# Patient Record
Sex: Female | Born: 1935 | Race: Black or African American | Hispanic: No | Marital: Single | Smoking: Never smoker
Health system: Southern US, Community
[De-identification: ages and names within clinical notes are randomized; demographics above are authoritative.]

## PROBLEM LIST (undated history)

## (undated) DIAGNOSIS — I639 Cerebral infarction, unspecified: Secondary | ICD-10-CM

## (undated) DIAGNOSIS — I10 Essential (primary) hypertension: Secondary | ICD-10-CM

## (undated) DIAGNOSIS — E7800 Pure hypercholesterolemia, unspecified: Secondary | ICD-10-CM

## (undated) DIAGNOSIS — B370 Candidal stomatitis: Secondary | ICD-10-CM

## (undated) DIAGNOSIS — I4891 Unspecified atrial fibrillation: Secondary | ICD-10-CM

## (undated) DIAGNOSIS — C9200 Acute myeloblastic leukemia, not having achieved remission: Secondary | ICD-10-CM

## (undated) DIAGNOSIS — Z7189 Other specified counseling: Secondary | ICD-10-CM

## (undated) HISTORY — PX: CATARACT EXTRACTION: SUR2

## (undated) HISTORY — PX: ABDOMINAL HYSTERECTOMY: SHX81

---

## 2000-10-18 ENCOUNTER — Encounter: Payer: Self-pay | Admitting: Emergency Medicine

## 2000-10-18 ENCOUNTER — Emergency Department (HOSPITAL_COMMUNITY): Admission: EM | Admit: 2000-10-18 | Discharge: 2000-10-18 | Payer: Self-pay

## 2002-02-09 ENCOUNTER — Encounter: Payer: Self-pay | Admitting: Internal Medicine

## 2002-02-09 ENCOUNTER — Encounter: Admission: RE | Admit: 2002-02-09 | Discharge: 2002-02-09 | Payer: Self-pay | Admitting: Internal Medicine

## 2002-03-06 ENCOUNTER — Encounter: Admission: RE | Admit: 2002-03-06 | Discharge: 2002-03-06 | Payer: Self-pay | Admitting: Internal Medicine

## 2002-03-06 ENCOUNTER — Encounter: Payer: Self-pay | Admitting: Internal Medicine

## 2002-05-02 ENCOUNTER — Ambulatory Visit (HOSPITAL_COMMUNITY): Admission: RE | Admit: 2002-05-02 | Discharge: 2002-05-02 | Payer: Self-pay | Admitting: Gastroenterology

## 2002-10-16 ENCOUNTER — Emergency Department (HOSPITAL_COMMUNITY): Admission: EM | Admit: 2002-10-16 | Discharge: 2002-10-16 | Payer: Self-pay | Admitting: Emergency Medicine

## 2003-03-12 ENCOUNTER — Encounter: Admission: RE | Admit: 2003-03-12 | Discharge: 2003-03-12 | Payer: Self-pay | Admitting: Internal Medicine

## 2004-01-16 ENCOUNTER — Other Ambulatory Visit: Admission: RE | Admit: 2004-01-16 | Discharge: 2004-01-16 | Payer: Self-pay | Admitting: Internal Medicine

## 2004-04-09 ENCOUNTER — Encounter: Admission: RE | Admit: 2004-04-09 | Discharge: 2004-04-09 | Payer: Self-pay | Admitting: Internal Medicine

## 2004-07-03 ENCOUNTER — Ambulatory Visit: Payer: Self-pay | Admitting: Pulmonary Disease

## 2004-07-03 ENCOUNTER — Emergency Department (HOSPITAL_COMMUNITY): Admission: EM | Admit: 2004-07-03 | Discharge: 2004-07-03 | Payer: Self-pay | Admitting: Emergency Medicine

## 2005-01-20 ENCOUNTER — Encounter: Admission: RE | Admit: 2005-01-20 | Discharge: 2005-01-20 | Payer: Self-pay | Admitting: Internal Medicine

## 2005-04-28 ENCOUNTER — Encounter: Admission: RE | Admit: 2005-04-28 | Discharge: 2005-04-28 | Payer: Self-pay | Admitting: Internal Medicine

## 2006-05-02 ENCOUNTER — Encounter: Admission: RE | Admit: 2006-05-02 | Discharge: 2006-05-02 | Payer: Self-pay | Admitting: Internal Medicine

## 2007-04-03 ENCOUNTER — Inpatient Hospital Stay (HOSPITAL_COMMUNITY): Admission: EM | Admit: 2007-04-03 | Discharge: 2007-04-06 | Payer: Self-pay | Admitting: Emergency Medicine

## 2007-04-04 ENCOUNTER — Encounter (INDEPENDENT_AMBULATORY_CARE_PROVIDER_SITE_OTHER): Payer: Self-pay | Admitting: Internal Medicine

## 2007-04-04 ENCOUNTER — Ambulatory Visit: Payer: Self-pay | Admitting: Vascular Surgery

## 2007-04-05 ENCOUNTER — Ambulatory Visit: Payer: Self-pay | Admitting: Physical Medicine & Rehabilitation

## 2007-04-26 ENCOUNTER — Encounter: Admission: RE | Admit: 2007-04-26 | Discharge: 2007-04-26 | Payer: Self-pay | Admitting: Sports Medicine

## 2007-05-05 ENCOUNTER — Encounter: Admission: RE | Admit: 2007-05-05 | Discharge: 2007-05-05 | Payer: Self-pay | Admitting: Internal Medicine

## 2007-06-08 ENCOUNTER — Emergency Department (HOSPITAL_COMMUNITY): Admission: EM | Admit: 2007-06-08 | Discharge: 2007-06-08 | Payer: Self-pay | Admitting: Emergency Medicine

## 2007-06-24 ENCOUNTER — Emergency Department (HOSPITAL_COMMUNITY): Admission: EM | Admit: 2007-06-24 | Discharge: 2007-06-24 | Payer: Self-pay | Admitting: Emergency Medicine

## 2007-06-27 ENCOUNTER — Encounter: Admission: RE | Admit: 2007-06-27 | Discharge: 2007-09-25 | Payer: Self-pay | Admitting: Neurology

## 2007-07-09 ENCOUNTER — Emergency Department (HOSPITAL_COMMUNITY): Admission: EM | Admit: 2007-07-09 | Discharge: 2007-07-09 | Payer: Self-pay | Admitting: Emergency Medicine

## 2007-09-15 ENCOUNTER — Emergency Department (HOSPITAL_COMMUNITY): Admission: EM | Admit: 2007-09-15 | Discharge: 2007-09-15 | Payer: Self-pay | Admitting: Emergency Medicine

## 2008-05-06 ENCOUNTER — Encounter: Admission: RE | Admit: 2008-05-06 | Discharge: 2008-05-06 | Payer: Self-pay | Admitting: Internal Medicine

## 2008-05-20 ENCOUNTER — Emergency Department (HOSPITAL_COMMUNITY): Admission: EM | Admit: 2008-05-20 | Discharge: 2008-05-20 | Payer: Self-pay | Admitting: Emergency Medicine

## 2009-05-08 ENCOUNTER — Encounter: Admission: RE | Admit: 2009-05-08 | Discharge: 2009-05-08 | Payer: Self-pay | Admitting: Internal Medicine

## 2009-08-08 ENCOUNTER — Emergency Department (HOSPITAL_COMMUNITY): Admission: EM | Admit: 2009-08-08 | Discharge: 2009-08-08 | Payer: Self-pay | Admitting: Emergency Medicine

## 2010-03-08 ENCOUNTER — Emergency Department (HOSPITAL_COMMUNITY)
Admission: EM | Admit: 2010-03-08 | Discharge: 2010-03-08 | Payer: Self-pay | Source: Home / Self Care | Admitting: Emergency Medicine

## 2010-03-14 ENCOUNTER — Inpatient Hospital Stay (HOSPITAL_COMMUNITY)
Admission: RE | Admit: 2010-03-14 | Discharge: 2010-03-17 | Payer: Self-pay | Source: Home / Self Care | Attending: Orthopedic Surgery | Admitting: Orthopedic Surgery

## 2010-05-01 ENCOUNTER — Other Ambulatory Visit: Payer: Self-pay | Admitting: Internal Medicine

## 2010-05-01 DIAGNOSIS — Z1239 Encounter for other screening for malignant neoplasm of breast: Secondary | ICD-10-CM

## 2010-05-01 DIAGNOSIS — Z1231 Encounter for screening mammogram for malignant neoplasm of breast: Secondary | ICD-10-CM

## 2010-05-15 ENCOUNTER — Ambulatory Visit
Admission: RE | Admit: 2010-05-15 | Discharge: 2010-05-15 | Disposition: A | Discharge status: Home / Self Care | Payer: Medicare Other | Source: Ambulatory Visit | Attending: Internal Medicine | Admitting: Internal Medicine

## 2010-05-15 DIAGNOSIS — Z1231 Encounter for screening mammogram for malignant neoplasm of breast: Secondary | ICD-10-CM

## 2010-05-31 ENCOUNTER — Observation Stay (HOSPITAL_COMMUNITY)
Admission: EM | Admit: 2010-05-31 | Discharge: 2010-06-01 | Disposition: A | Discharge status: Home / Self Care | Payer: Medicare Other | Attending: Internal Medicine | Admitting: Internal Medicine

## 2010-05-31 DIAGNOSIS — M1990 Unspecified osteoarthritis, unspecified site: Secondary | ICD-10-CM | POA: Insufficient documentation

## 2010-05-31 DIAGNOSIS — Z79899 Other long term (current) drug therapy: Secondary | ICD-10-CM | POA: Insufficient documentation

## 2010-05-31 DIAGNOSIS — K219 Gastro-esophageal reflux disease without esophagitis: Secondary | ICD-10-CM | POA: Insufficient documentation

## 2010-05-31 DIAGNOSIS — Z8673 Personal history of transient ischemic attack (TIA), and cerebral infarction without residual deficits: Secondary | ICD-10-CM | POA: Insufficient documentation

## 2010-05-31 DIAGNOSIS — E785 Hyperlipidemia, unspecified: Secondary | ICD-10-CM | POA: Insufficient documentation

## 2010-05-31 DIAGNOSIS — E559 Vitamin D deficiency, unspecified: Secondary | ICD-10-CM | POA: Insufficient documentation

## 2010-05-31 DIAGNOSIS — R0789 Other chest pain: Principal | ICD-10-CM | POA: Insufficient documentation

## 2010-05-31 DIAGNOSIS — I10 Essential (primary) hypertension: Secondary | ICD-10-CM | POA: Insufficient documentation

## 2010-05-31 DIAGNOSIS — R0602 Shortness of breath: Secondary | ICD-10-CM | POA: Insufficient documentation

## 2010-05-31 DIAGNOSIS — R112 Nausea with vomiting, unspecified: Secondary | ICD-10-CM | POA: Insufficient documentation

## 2010-05-31 DIAGNOSIS — K224 Dyskinesia of esophagus: Secondary | ICD-10-CM | POA: Insufficient documentation

## 2010-06-01 ENCOUNTER — Emergency Department (HOSPITAL_COMMUNITY): Payer: Medicare Other

## 2010-06-01 LAB — POCT CARDIAC MARKERS
CKMB, poc: 1.4 ng/mL (ref 1.0–8.0)
Troponin i, poc: <0.05 ng/mL (ref 0.00–0.09)

## 2010-06-01 LAB — URINE MICROSCOPIC-ADD ON

## 2010-06-01 LAB — COMPREHENSIVE METABOLIC PANEL
Albumin: 3.5 g/dL (ref 3.5–5.2)
Alkaline Phosphatase: 69 U/L (ref 39–117)
BUN: 14 mg/dL (ref 6–23)
GFR calc Af Amer: >60 mL/min (ref >60)
Potassium: 4 mEq/L (ref 3.5–5.1)
Sodium: 138 mEq/L (ref 135–145)
Total Protein: 7.2 g/dL (ref 6.0–8.3)

## 2010-06-01 LAB — CBC
HCT: 36.7 % (ref 36.0–46.0)
Hemoglobin: 11.1 g/dL — ABNORMAL LOW (ref 12.0–15.0)
MCV: 90.6 fL (ref 78.0–100.0)
RBC: 4.05 MIL/uL (ref 3.87–5.11)
RDW: 13.8 % (ref 11.5–15.5)
WBC: 7.7 10*3/uL (ref 4.0–10.5)

## 2010-06-01 LAB — URINALYSIS, ROUTINE W REFLEX MICROSCOPIC
Hgb urine dipstick: NEGATIVE
Specific Gravity, Urine: 1.015 (ref 1.005–1.030)
Urobilinogen, UA: 1 mg/dL (ref 0.0–1.0)

## 2010-06-01 LAB — BASIC METABOLIC PANEL
BUN: 11 mg/dL (ref 6–23)
CO2: 27 mEq/L (ref 19–32)
Chloride: 109 mEq/L (ref 96–112)
Glucose, Bld: 117 mg/dL — ABNORMAL HIGH (ref 70–99)
Potassium: 4.1 mEq/L (ref 3.5–5.1)

## 2010-06-01 LAB — CARDIAC PANEL(CRET KIN+CKTOT+MB+TROPI): CK, MB: 1.5 ng/mL (ref 0.3–4.0)

## 2010-06-01 LAB — LIPID PANEL
Cholesterol: 186 mg/dL (ref 0–200)
HDL: 72 mg/dL (ref >39)
LDL Cholesterol: 108 mg/dL — ABNORMAL HIGH (ref 0–99)
Triglycerides: 29 mg/dL (ref <150)

## 2010-06-01 LAB — CK TOTAL AND CKMB (NOT AT ARMC): Relative Index: INVALID (ref 0.0–2.5)

## 2010-06-08 NOTE — H&P (Signed)
NAME:  Leah Lewis, Leah Lewis              ACCOUNT NO.:  [id number]  MEDICAL RECORD NO.:  [id number]           PATIENT TYPE:  E  LOCATION:  WLED                         FACILITY:  [facility]  PHYSICIAN:  Della Goo, M.D. DATE OF BIRTH:  [birth year]  DATE OF ADMISSION:  06/01/2010 DATE OF DISCHARGE:                             HISTORY & PHYSICAL   DATE OF ADMISSION:  June 01, 2010.  PRIMARY CARE PHYSICIAN:  Georgann Housekeeper, MD  CHIEF COMPLAINT:  Chest pain.  HISTORY OF PRESENT ILLNESS:  This is a 76 year old female who presents to the emergency department secondary to complaints of severe chest pain, tightness and choking spells, which started at about 7 p.m.  The patient reports having a constant feeling of chest tightness and rated the discomfort as being a 7/10.  She reports having 4 episodes of vomiting and reports also having shortness of breath.  She states the discomfort in the midchest area feels like indigestion as well.  The pain lasted approximately 2 hours until she had been given sublingual nitroglycerin x1 in the emergency department.  The emergency department evaluation revealed negative point-of-care markers and an EKG without cardiac changes.  She was referred for medical admission for cardiac rule out.  PAST MEDICAL HISTORY:  Significant for: 1. Hyperlipidemia. 2. 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 3. History of vitamin D deficiency.  PAST SURGICAL HISTORY:  History of a hysterectomy and an open reduction and internal fixation of right ankle and also tonsillectomy.  MEDICATIONS:  Lipitor, vitamin D, calcium, aspirin, prednisolone acetate and Acular.  ALLERGIES:  OXYCODONE, which causes panic attack like symptoms.  SOCIAL HISTORY:  The patient lives with her daughter.  She is a nonsmoker, nondrinker.  No history of illicit drug usage.  FAMILY HISTORY:  Positive for coronary artery disease,  hypertension, diabetes in her mother.  She also has 2 brothers with diabetic disease and she has a sister with breast cancer.  REVIEW OF SYSTEMS:  Pertinents are mentioned above.  PHYSICAL EXAMINATION:  GENERAL:  This is a 76 year old well-nourished, well-developed elderly African American female who is in no discomfort or acute distress currently and she is currently chest pain-free. VITAL SIGNS:  Temperature 97.8, blood pressure 135/73, heart rate 69, respirations 21, O2 saturations 97% to 99%. HEENT:  Normocephalic, atraumatic.  Pupils equally round and reactive to light.  Extraocular movements are intact.  Funduscopic benign.  There is no scleral icterus.  Nares are patent bilaterally.  Oropharynx is clear. NECK:  Supple.  Full range of motion.  No thyromegaly, adenopathy, or jugular venous distention. CARDIOVASCULAR:  Regular rate and rhythm.  No murmurs, gallops or rubs appreciated. LUNGS:  Clear to auscultation bilaterally.  No rales, rhonchi or wheezes. ABDOMEN:  Positive bowel sounds, soft, nontender, nondistended.  No hepatosplenomegaly. EXTREMITIES:  Without cyanosis, clubbing or edema. NEUROLOGIC:  Mild right-sided weakness.  Cranial nerves are intact, but otherwise, there are no focal deficits.  LABORATORY STUDIES:  Point-of-care cardiac markers with a myoglobin of 108, CK-MB 1.4, troponin less than 0.05, lipase 19.  Urinalysis with trace  leukocyte esterase.  Urine microscopic negative.  Chemistry with a sodium of 138, potassium 4.0, chloride 107, carbon dioxide 24, BUN 14, creatinine 0.74, glucose 150, albumin 3.5, AST 19, ALT 11, calcium 8.6. Chest x-ray reveals no acute disease process and EKG reveals a normal sinus rhythm without acute ST-segment changes and the rate is 73.  ASSESSMENT:  This is a 76 year old female being admitted with: 1. Chest pain. 2. Nausea and vomiting. 3. Shortness of breath. 4. Dyslipidemia.  PLAN:  The patient will be admitted for  23-hour observation to a telemetry bed.  Cardiac enzymes will be performed.  The patient will be placed on Nitroglycerin paste, oxygen, and aspirin therapies.  Her regular medications will be further reconciled and GI and DVT prophylaxis will be ordered.  Further workup will ensue pending results of the patient's clinical course and her studies.  She is a full code.     Della Goo, M.D.     HJ/MEDQ  D:  06/01/2010  T:  06/01/2010  Job:  [id number]  cc:   Georgann Housekeeper, MD Fax: [phone]  Electronically Signed by Della Goo M.D. on 06/08/2010 08:24:39 PM

## 2010-06-11 NOTE — Discharge Summary (Signed)
  NAME:  Leah Lewis, Leah Lewis              ACCOUNT NO.:  [id number]  MEDICAL RECORD NO.:  [id number]           PATIENT TYPE:  I  LOCATION:  1411                         FACILITY:  [facility]  PHYSICIAN:  Georgann Housekeeper, MD      DATE OF BIRTH:  07-31-1934  DATE OF ADMISSION:  05/31/2010 DATE OF DISCHARGE:  06/01/2010                              DISCHARGE SUMMARY   DISCHARGE DIAGNOSES: 1. Chest pain, atypical, rule out for myocardial infarction. 2. Gastroesophageal reflux disease/esophageal spasm. 3. History of hypertension. 4. History of osteoarthritis. 5. History of cerebrovascular accident in the past. 6. History of dyslipidemia.  MEDICATION ON DISCHARGE:  Continue with all the home medications which included Lipitor, aspirin, eyedrops and blood pressure medication, omeprazole 40 mg daily new prescription.  LABORATORY DATA:  Cardiac markers x2 negative.  Cholesterol 186, LDL of 108, HDL of 72.  CBC:  White count of 7.7, hemoglobin 11.1.  Blood chemistries:  Potassium 4.1, creatinine 0.7, sodium 140, glucose 117. GI:  LFTs normal.  Lipase 19.  TSH was 4.2.  RADIOLOGIC:  Chest x-ray negative.  EKG normal sinus rhythm.  Telemetry normal sinus.  UA negative.  HOSPITAL COURSE:  A 76 year old female who presented with some chest tightness symptoms and choking sensation was admitted for rule out protocol. 1. Cardiac:  The patient was admitted to Telemetry.  Her monitor     showed normal sinus rhythm.  She had EKG normal.  Chest x-ray was     negative.  Cardiac markers with troponin was negative x2.  She did     not have any more pain.  She, getting more of the history, found to     be more esophageal spasm and acid reflux disease.  Started on PPI.     The patient will continue on this.  As far as her blood pressure     medications and cholesterol medications remained the same as it was     at home.  The patient was put on observation status and was sent     home.  Follow up in two  weeks.     Georgann Housekeeper, MD     KH/MEDQ  D:  06/02/2010  T:  06/02/2010  Job:  [id number]  Electronically Signed by Georgann Housekeeper MD on 06/09/2010 09:39:32 PM

## 2010-06-16 LAB — BASIC METABOLIC PANEL
BUN: 7 mg/dL (ref 6–23)
GFR calc Af Amer: >60 mL/min (ref >60)
GFR calc non Af Amer: >60 mL/min (ref >60)
Potassium: 4.2 mEq/L (ref 3.5–5.1)

## 2010-06-16 LAB — CBC
HCT: 34.5 % — ABNORMAL LOW (ref 36.0–46.0)
MCH: 28.8 pg (ref 26.0–34.0)
Platelets: 181 10*3/uL (ref 150–400)
Platelets: 192 10*3/uL (ref 150–400)
RBC: 3.72 MIL/uL — ABNORMAL LOW (ref 3.87–5.11)
RDW: 13.3 % (ref 11.5–15.5)
WBC: 9.5 10*3/uL (ref 4.0–10.5)

## 2010-06-16 LAB — GLUCOSE, CAPILLARY: Glucose-Capillary: 111 mg/dL — ABNORMAL HIGH (ref 70–99)

## 2010-06-23 LAB — CBC
HCT: 38.4 % (ref 36.0–46.0)
MCV: 89.3 fL (ref 78.0–100.0)
Platelets: 179 10*3/uL (ref 150–400)
RDW: 14.1 % (ref 11.5–15.5)

## 2010-06-23 LAB — URINALYSIS, ROUTINE W REFLEX MICROSCOPIC
Hgb urine dipstick: NEGATIVE
Nitrite: NEGATIVE
Specific Gravity, Urine: 1.011 (ref 1.005–1.030)
Urobilinogen, UA: 1 mg/dL (ref 0.0–1.0)
pH: 7 (ref 5.0–8.0)

## 2010-06-23 LAB — DIFFERENTIAL
Basophils Absolute: 0 10*3/uL (ref 0.0–0.1)
Lymphocytes Relative: 9 % — ABNORMAL LOW (ref 12–46)
Monocytes Absolute: 0.1 10*3/uL (ref 0.1–1.0)
Monocytes Relative: 1 % — ABNORMAL LOW (ref 3–12)
Neutro Abs: 9.1 10*3/uL — ABNORMAL HIGH (ref 1.7–7.7)

## 2010-06-23 LAB — COMPREHENSIVE METABOLIC PANEL
Albumin: 3.7 g/dL (ref 3.5–5.2)
BUN: 8 mg/dL (ref 6–23)
Calcium: 8.8 mg/dL (ref 8.4–10.5)
Creatinine, Ser: 0.79 mg/dL (ref 0.4–1.2)
Total Protein: 7.2 g/dL (ref 6.0–8.3)

## 2010-06-23 LAB — URINE CULTURE

## 2010-08-18 NOTE — H&P (Signed)
NAME:  Leah Lewis, Leah Lewis              ACCOUNT NO.:  [id number]   MEDICAL RECORD NO.:  [id number]          PATIENT TYPE:  INP   LOCATION:  1401                         FACILITY:  [facility]   PHYSICIAN:  Hollice Espy, M.D.DATE OF BIRTH:  [birth year]   DATE OF ADMISSION:  04/03/2007  DATE OF DISCHARGE:                              HISTORY & PHYSICAL   ATTENDING PHYSICIAN:  Hollice Espy, M.D.   PRIMARY CARE PHYSICIAN:  Georgann Housekeeper, M.D.   CONSULTATIONS:  Pramod P. Pearlean Brownie, M.D.   CHIEF COMPLAINT:  Right-sided weakness.   HISTORY OF PRESENT ILLNESS:  The patient is a 76 year old African-  American female with past medical history of osteoporosis and arthritis,  mostly of the right hip, who 2 days prior to coming in started to have  sudden onset weakness and numbness in her right upper extremity followed  by less so in her right lower extremity.  These symptoms started on  Saturday and continued to persist to the point where she was urged to  come into the emergency room. She finally came in today, the day of  admission, April 03, 2007.  CT scan of the head noted a left insular  cortex infarct.  Currently the radiology system is down, but this is  passed on from the neurology attending.  Neurology was consulted due to  initial stroke workup and found the patient had a subacute infarct and  recommended that the patient come in for further evaluation and testing  as the cause of her stroke could not immediately be discerned given the  fact that her blood pressure and blood sugars were within a stable  range.   When the patient first presented to the emergency room, her blood  pressure was 148/70.  Outside of her right-sided weakness, her other  symptoms appeared to be stable. She had no signs of dysarthric speech,  no signs of left-sided weakness, paralysis, or other issues.  In  addition, the patient states that from when the onset of her symptoms  began, her right-sided  weakness seems to be a little bit better with the  numbness, though, persisting upper greater than lower.  Currently she is  otherwise doing well.  She denies any headaches, vision changes,  dysphagia, chest pain, palpitations, shortness of breath, wheeze, cough,  abdominal pain, hematuria, dysuria, constipation, diarrhea, extremity  numbness, weakness, or pain other than as described above.   REVIEW OF SYSTEMS:  Otherwise negative.   PAST MEDICAL HISTORY:  1. Arthritis.  2. Osteoporosis.   MEDICATIONS:  Diclofenac, Actonel.   ALLERGIES:  No known drug allergies.   SOCIAL HISTORY:  She denies tobacco, alcohol, or drug use.   FAMILY HISTORY:  Noncontributory.   PHYSICAL EXAMINATION:  VITAL SIGNS:  On admission, blood pressure  148/70, now down to 121/55.  Respirations 18, O2 saturation 98% on room  air.  Temperature 97.7, pulse 52.  GENERAL:  Alert and oriented x3.  No apparent distress.  HEENT:  Normocephalic and atraumatic.  Mucous membranes are moist.  NECK:  No carotid bruits.  HEART:  Regular rate and rhythm.  S1,  S2.  LUNGS:  Clear to auscultation bilaterally.  ABDOMEN:  Soft, nontender, nondistended.  Positive bowel sounds.  EXTREMITIES:  No clubbing, cyanosis, or edema.  NEUROLOGIC:  She has evidence of a 4 to 4+ strength of the right upper  extremity to a 5 to 5- on the left upper extremity in terms of grip and  flexion.  Her lower extremities show 4+ on the left, 5 to 5- on the  left.  She has no evidence of Babinski sign.  Appears to have somewhat  decreased sensation, but this may be more suggestive than anything else.  Otherwise, she has no type of any type of cerebellar dysfunction.   LABORATORY DATA:  CPK 96, MB 1.0, troponin I 0.03.  Sodium 141,  potassium 4, chloride 106, bicarb 30, BUN 8, creatinine 0.7, glucose 98.  White count 6.6, hemoglobin 11.4, hematocrit 34, MCV 86, platelet count  264.   ASSESSMENT AND PLAN:  1. Subacute left middle cerebral artery  branch infarct determined by      neurology.  Etiology to be determined.  Plan for telemetry, a 2-D      echocardiogram, carotid Dopplers, MRI, MRA to be done.  Will also      check a fasting lipid profile, homocysteine, and hemoglobin A1c.      PT, OT, and rehab to follow.  Neurology will be following along as      well.  2. Osteoporosis, currently stable.  3. Arthritis. The patient is currently pain free.      Hollice Espy, M.D.  Electronically Signed     SKK/MEDQ  D:  04/03/2007  T:  04/03/2007  Job:  [id number]   cc:   Georgann Housekeeper, MD  Fax: [phone]

## 2010-08-18 NOTE — Discharge Summary (Signed)
NAME:  Leah Lewis, Leah Lewis              ACCOUNT NO.:  [id number]   MEDICAL RECORD NO.:  [id number]          PATIENT TYPE:  INP   LOCATION:  1401                         FACILITY:  [facility]   PHYSICIAN:  Deirdre Peer. Polite, M.D. DATE OF BIRTH:  08-15-1934   DATE OF ADMISSION:  04/03/2007  DATE OF DISCHARGE:                               DISCHARGE SUMMARY   DISCHARGE DIAGNOSES:  1. Subacute infarct in the left insular cortex; 2D echocardiogram with      preserved left ventricular function, no source of emboli; bilateral      carotid Doppler, doubt stenosis.  Seen by neuro, PT/OT and speech.      Cleared for discharge with home health, PT and a rolling walker.  2. Elevated blood pressure consistent with hypertension, patient      started on Avapro 75 mg, will continue at home.  3. Dyslipidemia, total cholesterol 191, HDL 39, LDL 134.  LDL is      greater than optimal, will discharge on simvastatin 20 mg daily.  4. Arthritis.  5. Osteoporosis.   DISCHARGE MEDICATIONS:  NEW:  1. Aspirin 325 mg daily.  2. Avapro 75 mg daily.  3. Simvastatin 20 mg daily.  4. Patient will continue Diclofenac and Actonel.   DISPOSITION:  Discharged to home in stable condition with home health  physical therapy as well as a rolling walker.   CONSULTANTS:  Dr. Pearlean Brownie, neurology.   STUDIES:  1. The patient was seen by speech without signs or symptoms of      dysphagia.  2. Carotid Doppler.  Preliminary findings:  No significant plaque, no      significant internal carotid artery stenosis.  3. Lipid panel:  As stated above.  4. CT of the head:  Low density in the left periinsular cortex, acute      left middle cerebral artery infarct cannot be excluded.  5. MRI of the brain:  Final result pending.  There has been difficulty      with the [facility] system this weekend.  6. Hemoglobin A1C 6.2.   HISTORY OF PRESENT ILLNESS:  [age]-year-old female presented to the  hospital with right-sided weakness.  The patient had  an abnormal CT.  Admission was deemed necessary for further evaluation and treatment.  Please see dictated history and physical as well as neuro consult for  further details.   PAST MEDICAL HISTORY:  As stated above.   ALLERGIES:  NONE.   SOCIAL HISTORY:  Negative for tobacco, alcohol or drugs.   FAMILY HISTORY:  Noncontributory.   HOSPITAL COURSE:  The patient was admitted to a telemetry bed for  evaluation and treatment of subacute CVA.  She was seen in consultation  by neurology, had several studies as stated above.  The patient was seen  by speech therapy, physical and occupational therapy.  The patient's  right-sided weakness improved dramatically during this hospitalization.  She was also seen by rehab and they did not feel that inpatient rehab  was required.  The patient is tolerating p.o., blood pressure is fairly  controlled; however, may require further titration of medications at  home.  She is up and ambulating with a rolling walker, and is felt to be  stable for discharge to home.  The patient will be discharged and will  have followup by her primary M.D. in approximately one week.      Deirdre Peer. Polite, M.D.  Electronically Signed     RDP/MEDQ  D:  04/06/2007  T:  04/06/2007  Job:  [id number]

## 2010-08-18 NOTE — Consult Note (Signed)
NAME:  Leah Lewis, Derricka              ACCOUNT NO.:  [id number]   MEDICAL RECORD NO.:  [id number]          PATIENT TYPE:  INP   LOCATION:  1401                         FACILITY:  [facility]   PHYSICIAN:  Pramod P. Pearlean Brownie, MD    DATE OF BIRTH:  [birth year]   DATE OF CONSULTATION:  DATE OF DISCHARGE:                                 CONSULTATION   REFERRING PHYSICIAN:  Dr. __________.   REASON FOR REFERRAL:  Stroke.   HISTORY OF PRESENT ILLNESS:  Ms. Leah Lewis is a 76 year old pleasant  African American lady who developed sudden onset of right hand  paresthesias and weakness on Friday, 5 days ago.  She also noticed that  she was dragging her right leg and was weak.  She called her primary  care physician, was away and the covering physician was prescribed some  Skelaxin as the patient was complaining of some right shoulder and arm  pain as well.  Her symptoms have persisted and hence family brought her  to the emergency room today.  The patient has no known prior history of  stroke, TIA, seizures or significant neurological problems.   PAST MEDICAL HISTORY:  Arthritis.   HOME MEDICATIONS:  Skelaxin, Tylenol and Actonel injections.   PAST SURGICAL HISTORY:  None.   ALLERGIES TO MEDICATIONS:  None.   PRIMARY PHYSICIAN:  __________, M.D.   SOCIAL HISTORY:  The patient lives at home with her daughter.  She is  independent in activities of daily living.  Does not currently smoke or  drink.   PHYSICAL EXAMINATION:  GENERAL:  A pleasant elderly African American  lady who is not in distress.  She is afebrile at present with a pulse  rate of 62 per minute and regular, blood pressure 140/70, temperature  98.1, respiratory rate 18 per minute.  HEENT:  Head is nontraumatic.  ENT exam unremarkable.  NECK:  Supple.  There is no bruit.  CARDIAC:  Normal.  No gallop.  LUNGS:  Clear to auscultation.  ABDOMEN:  Soft, nontender.  NEUROLOGICAL:  She is awake, alert, oriented to time, place and person.  There is no aphasia, apraxia or dysarthria.  Movements are full range.  There is no nystagmus.  Face is symmetric.  There is minimum right  nasolabial fold asymmetry.  Tongue is midline.  Motor system exam  reveals no upper extremity drift, but there is right lower extremity  drift present.  She has weakness of right grip and intrinsic hand  muscles.  She orbits the left and right upper extremity.  She has right  lower extremity drift.  She has significant weakness proximally and  distally and a grade 3/5 strength in right lower extremity.  She had  subjectively decreased touch to pinprick sensation on the right face and  upper and lower body as well.  Coordination is impaired on the right.   On the NIH stroke scale she scored four.  Data reviewed noncontrast CAT  scan of the head done today and reportedly shows a left insular cortex  infarct.  She is subacute actual films are not available for my review  due to the system being down.  WBC count and electrolytes are normal.   IMPRESSION:  [age]-year lady with a subacute left middle cerebral  artery branch infarct.  No known vascular risk factors.  She is admitted  for further stroke workup.   PLAN:  Recommend checking MRA of the brain and MRI scan of the brain,  carotid transcranial Doppler studies, fasting lipid profile, hemoglobin  A1c and homocysteine.  Start aspirin for stroke prevention from for now.  Before happy to follow the patient in consult.  I had a long discussion  with the patient and her family members and answered questions.  Thank  you for the referral.  thank you, recall was an MD           ______________________________  Sunny Schlein. Pearlean Brownie, MD     PPS/MEDQ  D:  04/03/2007  T:  04/04/2007  Job:  [id number]

## 2010-08-21 NOTE — Op Note (Signed)
NAME:  Leah Lewis, Leah Lewis                        ACCOUNT NO.:  [id number]   MEDICAL RECORD NO.:  [id number]                   PATIENT TYPE:  AMB   LOCATION:  ENDO                                 FACILITY:  [facility]   PHYSICIAN:  Danise Edge, M.D.                DATE OF BIRTH:  [birth year]   DATE OF PROCEDURE:  05/02/2002  DATE OF DISCHARGE:                                 OPERATIVE REPORT   PROCEDURE:  Screening colonoscopy.   PROCEDURE INDICATION:  Leah Lewis is a 76 year old female, born Dec 24, 1934.  Leah Lewis is scheduled to undergo her first screening colonoscopy  with polypectomy to prevent colon cancer.  I discussed with Leah Lewis the  complications associated with colonoscopy and polypectomy including a 15 per  1000 risk of bleeding and 1 per 1000 risk of colon perforation.  Leah Lewis  has signed the operative permit.   ENDOSCOPIST:  Charolett Bumpers, M.D.   PREMEDICATION:  Versed 5 mg, Demerol 50 mg.   ENDOSCOPE:  Olympus pediatric colonoscope.   DESCRIPTION OF PROCEDURE:  After obtaining informed consent, Leah Lewis was  placed in the left lateral decubitus position.  I administered intravenous  Demerol and intravenous Versed to achieve conscious sedation for the  procedure.  The patient's blood pressure, oxygen saturation, and cardiac  rhythm were monitored throughout the procedure and documented in the medical  record.   Anal inspection was normal.  Digital rectal exam was normal.  The Olympus  pediatric video colonoscope was introduced into the rectum and advanced to  the proximal ascending colon.  Despite repositioning the patient from the  left lateral decubitus position to the supine position and right lateral  decubitus position while applying external abdominal pressure, I was never  able to intubate the cecum.  The lateral aspect of the cecal wall could be  visualized, but I could never visualize the medial aspect of the cecal wall.  Colonic  preparation for the exam today was excellent.   RECTUM:  Normal.  SIGMOID COLON AND DESCENDING COLON:  Normal.  SPLENIC FLEXURE:  Normal.  TRANSVERSE COLON:  Normal.  HEPATIC FLEXURE:  Normal.  ASCENDING COLON:  Normal.  CECUM AND ILEOCECAL VALVE:  I was unable to intubate the cecum.  With the  endoscope advanced to the proximal ascending colon, I was able to visualize  approximately 50% of the cecum, which appeared normal.    ASSESSMENT:  Normal proctocolonoscopy to the proximal ascending colon.  I  was unable to intubate the cecum.   RECOMMENDATIONS:  Repeat optical colonoscopy or virtual colonoscopy in five  years.                                               Danise Edge, M.D.  MJ/MEDQ  D:  05/02/2002  T:  05/02/2002  Job:  [id number]   cc:   Georgann Housekeeper, M.D.  [address]  [place]  [place] [address]  Fax: [phone]

## 2010-08-21 NOTE — Consult Note (Signed)
NAME:  Leah Lewis, Leah Lewis              ACCOUNT NO.:  [id number]   MEDICAL RECORD NO.:  [id number]          PATIENT TYPE:  EMS   LOCATION:  [facility]                         FACILITY:  [facility]   PHYSICIAN:  Oley Balm. Sung Amabile, M.D. [facility] OF BIRTH:  [birth year]   DATE OF CONSULTATION:  07/03/2004  DATE OF DISCHARGE:                                   CONSULTATION   REFERRING PHYSICIAN:  Dr. Pete Glatter   REASON FOR CONSULTATION:  Mediastinal adenopathy and right pulmonary  nodules.   HISTORY OF PRESENT ILLNESS:  Ms. Leah Lewis is a 76 year old African-American  woman with no prior pulmonary history.  She was in her usual state of good  health until 3 days ago when she developed fevers, chills, cough (white  phlegm), right-sided chest discomfort.  On the day of admission, she became  short of breath with chest tightness.  She presented to the emergency  department with these complaints and in evaluation, had a D-dimer performed  which was mildly elevated.  Consequently, a CT scan of the chest was  performed with the findings noted below.  The findings on the CT scan of the  chest prompted this consultation.  Prior to the onset of this illness, she  had no respiratory complaints whatsoever and has never been told that she  had any abnormal chest x-rays.  She has never had a CAT scan of the chest in  the past.   PAST MEDICAL HISTORY:  1.  Glaucoma.  2.  Osteoarthritis.  3.  Status post hysterectomy.  4.  History of cesarean section.   CURRENT MEDICATIONS:  1.  Eye drops.  2.  Tylenol.  3.  Actonel.   SOCIAL HISTORY:  Never smokes.  No history of significant occupational  exposures.   FAMILY HISTORY:  Noncontributory.   REVIEW OF SYSTEMS:  As per the history of present illness.  In addition, she  had some left leg numbness within the past couple of days.  This has now  resolved.   PHYSICAL EXAMINATION:  VITAL SIGNS:  Afebrile with normal vital signs.  Room  air oxygen saturation is normal.  GENERAL:  She is a well-developed, well-nourished, in no acute cardiac or  respiratory distress.  HEENT:  No acute abnormalities.  NECK:  Supple without adenopathy or jugular venous distention.  CHEST:  Normal percussion throughout.  Breath sounds are full without  wheezes or other adventitious sounds.  CARDIAC:  Regular rate and rhythm with no murmurs.  ABDOMEN:  Soft, nontender with normal bowel sounds.  EXTREMITIES:  Without cyanosis, clubbing, or edema.  NEUROLOGIC:  Without focal deficits.   DATA:  Chest x-ray reveals no acute cardiac or pulmonary disease.  Cardiac  silhouette is upper limits of normal.  CT scan of the chest reveals minimal  right hilar and subcarinal adenopathy.  She also has very small nodules  scattered on the right.  I count 3 or perhaps 4 nodules.   IMPRESSION:  Incidentally found mediastinal adenopathy and pulmonary nodules  - the nodules are extremely small and have the appearance of old  granulomatous disease.  I think it  is unlikely that these represent  malignancy (either primary or metastatic).  Her age would argue against  sarcoidosis.  It is extremely uncommon to make a diagnosis of sarcoidosis in  an individual greater than 53 years of age.  I suspect the adenopathy is  reactive and related to the respiratory tract infection.  In general, I  regard these as incidental findings of probably no significance.   RECOMMENDATIONS:  1.  No further evaluation at this time.  2.  At most, I would recheck a CT scan of the chest in 3 months to ensure      stability.  An alternative would be simply to follow chest x-rays every      3 months for the next year.  Although these nodules and lymph nodes are      not seen on the plain film, if this were a progressive malignant      disorder, they would indeed become evident over time.  3.  I have not scheduled a follow up with her but would be happy to see her      at any time in the future as deemed necessary by  other care providers.      DBS/MEDQ  D:  07/03/2004  T:  07/04/2004  Job:  [id number]   cc:   Hal T. Stoneking, M.D.  [address]  [place], [place] [address]  Fax: [phone]   Georgann Housekeeper, MD  [address]  [place]  [place] [address]  Fax: [phone]   Armanda Magic, M.D.  [address]  [place], [place] [address]  Fax: [phone]

## 2010-12-28 LAB — CBC
HCT: 36
Hemoglobin: 11.8 — ABNORMAL LOW
RBC: 4.07
RDW: 14.4
WBC: 9.1

## 2010-12-28 LAB — POCT CARDIAC MARKERS
Myoglobin, poc: 60.5
Operator id: 284251
Troponin i, poc: <0.05

## 2010-12-28 LAB — URINALYSIS, ROUTINE W REFLEX MICROSCOPIC
Hgb urine dipstick: NEGATIVE
Protein, ur: NEGATIVE
Urobilinogen, UA: 1

## 2010-12-28 LAB — DIFFERENTIAL
Basophils Relative: 0
Eosinophils Relative: 1
Lymphs Abs: 1.6
Monocytes Relative: 7
Neutro Abs: 6.8

## 2010-12-28 LAB — I-STAT 8, (EC8 V) (CONVERTED LAB)
BUN: 8
Bicarbonate: 30.8 — ABNORMAL HIGH
Chloride: 107
HCT: 40
Operator id: 285841
pCO2, Ven: 56.3 — ABNORMAL HIGH
pH, Ven: 7.346 — ABNORMAL HIGH

## 2010-12-28 LAB — URINE CULTURE: Colony Count: 60000

## 2010-12-28 LAB — URINE MICROSCOPIC-ADD ON

## 2010-12-29 LAB — POCT I-STAT, CHEM 8
BUN: 8
Hemoglobin: 12.9
Potassium: 4.1
Sodium: 142
TCO2: 32

## 2010-12-29 LAB — URINALYSIS, ROUTINE W REFLEX MICROSCOPIC
Ketones, ur: NEGATIVE
Nitrite: NEGATIVE
Protein, ur: NEGATIVE
Urobilinogen, UA: 1

## 2010-12-29 LAB — CBC
HCT: 36.3
Hemoglobin: 11.9 — ABNORMAL LOW
RBC: 4.09
RDW: 14.4
WBC: 9.1

## 2010-12-29 LAB — DIFFERENTIAL
Basophils Absolute: 0
Lymphocytes Relative: 23
Lymphs Abs: 2
Monocytes Absolute: 0.9
Neutro Abs: 5.9

## 2010-12-29 LAB — APTT: aPTT: 28

## 2010-12-29 LAB — URINE CULTURE

## 2010-12-29 LAB — URINE MICROSCOPIC-ADD ON

## 2011-01-08 LAB — BASIC METABOLIC PANEL
CO2: 30
Calcium: 8.6
GFR calc Af Amer: >60
GFR calc non Af Amer: >60
Potassium: 4
Sodium: 141

## 2011-01-08 LAB — CARDIAC PANEL(CRET KIN+CKTOT+MB+TROPI)
Relative Index: INVALID
Total CK: 96

## 2011-01-08 LAB — LIPID PANEL
Triglycerides: 88
VLDL: 18

## 2011-01-08 LAB — RAPID URINE DRUG SCREEN, HOSP PERFORMED: Barbiturates: NOT DETECTED

## 2011-01-08 LAB — URINALYSIS, ROUTINE W REFLEX MICROSCOPIC
Glucose, UA: NEGATIVE
Hgb urine dipstick: NEGATIVE
Specific Gravity, Urine: 1.014

## 2011-01-08 LAB — CBC
Hemoglobin: 11.4 — ABNORMAL LOW
RBC: 3.97

## 2011-01-08 LAB — HEMOGLOBIN A1C
Hgb A1c MFr Bld: 6.2 — ABNORMAL HIGH
Mean Plasma Glucose: 143

## 2011-01-08 LAB — URINE MICROSCOPIC-ADD ON

## 2011-02-22 ENCOUNTER — Encounter: Payer: Self-pay | Admitting: *Deleted

## 2011-02-22 ENCOUNTER — Emergency Department (HOSPITAL_COMMUNITY): Payer: Medicare Other

## 2011-02-22 ENCOUNTER — Emergency Department (HOSPITAL_COMMUNITY)
Admission: EM | Admit: 2011-02-22 | Discharge: 2011-02-22 | Disposition: A | Discharge status: Home / Self Care | Payer: Medicare Other | Attending: Emergency Medicine | Admitting: Emergency Medicine

## 2011-02-22 ENCOUNTER — Other Ambulatory Visit: Payer: Self-pay

## 2011-02-22 DIAGNOSIS — I10 Essential (primary) hypertension: Secondary | ICD-10-CM | POA: Insufficient documentation

## 2011-02-22 DIAGNOSIS — R05 Cough: Secondary | ICD-10-CM | POA: Insufficient documentation

## 2011-02-22 DIAGNOSIS — R109 Unspecified abdominal pain: Secondary | ICD-10-CM | POA: Insufficient documentation

## 2011-02-22 DIAGNOSIS — R059 Cough, unspecified: Secondary | ICD-10-CM | POA: Insufficient documentation

## 2011-02-22 DIAGNOSIS — R071 Chest pain on breathing: Secondary | ICD-10-CM | POA: Insufficient documentation

## 2011-02-22 DIAGNOSIS — Z79899 Other long term (current) drug therapy: Secondary | ICD-10-CM | POA: Insufficient documentation

## 2011-02-22 DIAGNOSIS — R0789 Other chest pain: Secondary | ICD-10-CM

## 2011-02-22 DIAGNOSIS — R6889 Other general symptoms and signs: Secondary | ICD-10-CM | POA: Insufficient documentation

## 2011-02-22 DIAGNOSIS — R062 Wheezing: Secondary | ICD-10-CM | POA: Insufficient documentation

## 2011-02-22 DIAGNOSIS — Z8673 Personal history of transient ischemic attack (TIA), and cerebral infarction without residual deficits: Secondary | ICD-10-CM | POA: Insufficient documentation

## 2011-02-22 DIAGNOSIS — Z7982 Long term (current) use of aspirin: Secondary | ICD-10-CM | POA: Insufficient documentation

## 2011-02-22 DIAGNOSIS — E785 Hyperlipidemia, unspecified: Secondary | ICD-10-CM | POA: Insufficient documentation

## 2011-02-22 DIAGNOSIS — R509 Fever, unspecified: Secondary | ICD-10-CM | POA: Insufficient documentation

## 2011-02-22 DIAGNOSIS — J3489 Other specified disorders of nose and nasal sinuses: Secondary | ICD-10-CM | POA: Insufficient documentation

## 2011-02-22 DIAGNOSIS — J069 Acute upper respiratory infection, unspecified: Secondary | ICD-10-CM

## 2011-02-22 HISTORY — DX: Essential (primary) hypertension: I10

## 2011-02-22 HISTORY — DX: Cerebral infarction, unspecified: I63.9

## 2011-02-22 LAB — CBC
MCHC: 31.5 g/dL (ref 30.0–36.0)
MCV: 89.3 fL (ref 78.0–100.0)
Platelets: 181 10*3/uL (ref 150–400)
RDW: 13.4 % (ref 11.5–15.5)
WBC: 9.4 10*3/uL (ref 4.0–10.5)

## 2011-02-22 LAB — CARDIAC PANEL(CRET KIN+CKTOT+MB+TROPI)
Relative Index: 1.8 (ref 0.0–2.5)
Total CK: 176 U/L (ref 7–177)

## 2011-02-22 LAB — DIFFERENTIAL
Basophils Absolute: 0 10*3/uL (ref 0.0–0.1)
Basophils Relative: 0 % (ref 0–1)
Eosinophils Absolute: 0.1 10*3/uL (ref 0.0–0.7)
Eosinophils Relative: 1 % (ref 0–5)
Lymphocytes Relative: 26 % (ref 12–46)

## 2011-02-22 MED ORDER — HYDROCODONE-ACETAMINOPHEN 5-325 MG PO TABS
1.0000 | ORAL_TABLET | Freq: Once | ORAL | Status: AC
  Administered 2011-02-22: 1 via ORAL
  Filled 2011-02-22: qty 1

## 2011-02-22 MED ORDER — HYDROCODONE-ACETAMINOPHEN 5-325 MG PO TABS: 2.0000 | ORAL_TABLET | ORAL | Status: DC | PRN

## 2011-02-22 MED ORDER — OXYCODONE-ACETAMINOPHEN 5-325 MG PO TABS: 2.0000 | ORAL_TABLET | ORAL | Status: AC | PRN

## 2011-02-22 NOTE — ED Notes (Signed)
Pt report received from Selena Batten, Charity fundraiser.  Pt sitting up in bed.  She denies pain/discomfort at this time.  Family at [facility].  Awaiting test results for disposition.

## 2011-02-22 NOTE — ED Notes (Signed)
To ed for eval of right side/rib pain since yesterday. States she has been coughing all night. Denies pain. C/o fever Friday night.

## 2011-02-22 NOTE — ED Notes (Signed)
Assisted pt up to bathroom via w/c without incident.

## 2011-02-22 NOTE — ED Provider Notes (Signed)
History     CSN: [id number] Arrival date & time: 02/22/2011 11:07 AM   First MD Initiated Contact with Patient 02/22/11 1208      Chief Complaint  Patient presents with  . Abdominal Pain    (Consider location/radiation/quality/duration/timing/severity/associated sxs/prior treatment) HPI Comments: Patient here with complaints of right lower rib and lateral chest pain - she states that she began to have cough and cold symptoms starting 1 week ago - she states that since then she developed a runny nose, had several days of fever and chills, but this eventually improved with over the counter medications.  She did not see her PCP for this as well - she states that starting yesterday morning when she rolled over in bed, she began to notice the pain, reports that it is worse with movement and coughing, reports that she has a productive cough with while sputum, denies wheezing or shortness of breath.  Denies nausea, vomiting, diaphoresis and reports no prior cardiac history.  Patient is a 76 y.o. female presenting with chest pain. The history is provided by the patient. No language interpreter was used.  Chest Pain The chest pain began yesterday. Duration of episode(s) is 20 minutes. Chest pain occurs intermittently. The chest pain is unchanged. The pain is associated with coughing and lifting. At its most intense, the pain is at 7/10. The pain is currently at 3/10. The severity of the pain is moderate. The quality of the pain is described as aching and sharp. The pain does not radiate. Chest pain is worsened by deep breathing and certain positions. Primary symptoms include cough and wheezing. Pertinent negatives for primary symptoms include no fever, no syncope, no shortness of breath, no palpitations, no nausea, no vomiting and no dizziness.  Pertinent negatives for associated symptoms include no near-syncope, no numbness, no paroxysmal nocturnal dyspnea and no weakness. She tried nothing for the  symptoms. Risk factors include being elderly.  Her past medical history is significant for hyperlipidemia and hypertension.     Past Medical History  Diagnosis Date  . Hypertension   . Stroke     History reviewed. No pertinent past surgical history.  History reviewed. No pertinent family history.  History  Substance Use Topics  . Smoking status: Not on file  . Smokeless tobacco: Not on file  . Alcohol Use: No    OB History    Grav Para Term Preterm Abortions TAB SAB Ect Mult Living                  Review of Systems  Constitutional: Negative for fever.  Respiratory: Positive for cough and wheezing. Negative for shortness of breath.   Cardiovascular: Positive for chest pain. Negative for palpitations, syncope and near-syncope.  Gastrointestinal: Negative for nausea and vomiting.  Neurological: Negative for dizziness, weakness and numbness.  All other systems reviewed and are negative.    Allergies  Percocet  Home Medications   Current Outpatient Rx  Name Route Sig Dispense Refill  . ASPIRIN EC 81 MG PO TBEC Oral Take 81 mg by mouth daily.      . ATORVASTATIN CALCIUM 40 MG PO TABS Oral Take 40 mg by mouth daily.      Marland Kitchen KETOROLAC TROMETHAMINE 0.5 % OP SOLN Both Eyes Place 1 drop into both eyes 4 (four) times daily.      Marland Kitchen LISINOPRIL 10 MG PO TABS Oral Take 10 mg by mouth daily.      Marland Kitchen METOPROLOL SUCCINATE 50 MG PO TB24  Oral Take 50 mg by mouth daily.      Marland Kitchen PREDNISOLONE ACETATE 1 % OP SUSP Both Eyes Place 1 drop into both eyes 4 (four) times daily.      Marland Kitchen VITAMIN D (CHOLECALCIFEROL) PO Oral Take 1 tablet by mouth daily.        BP 97/52  Pulse 66  Temp(Src) 97.8 F (36.6 C) (Oral)  Resp 16  SpO2 97%  Physical Exam  Nursing note and vitals reviewed. Constitutional: She is oriented to person, place, and time. She appears well-developed and well-nourished. No distress.  HENT:  Head: Normocephalic and atraumatic.  Right Ear: External ear normal.  Left Ear:  External ear normal.  Nose: Rhinorrhea present.  Mouth/Throat: Oropharynx is clear and moist.  Eyes: Conjunctivae are normal. Pupils are equal, round, and reactive to light.  Neck: Normal range of motion. Neck supple.  Cardiovascular: Normal rate, regular rhythm and intact distal pulses.  Exam reveals no gallop and no friction rub.   No murmur heard. Pulmonary/Chest: Effort normal and breath sounds normal. No respiratory distress. She has no wheezes. She has no rales. She exhibits tenderness.    Abdominal: Soft. Bowel sounds are normal. She exhibits no distension and no mass. There is no tenderness.  Musculoskeletal: Normal range of motion.  Neurological: She is alert and oriented to person, place, and time.  Skin: Skin is warm and dry. No rash noted. No erythema. No pallor.  Psychiatric: She has a normal mood and affect. Her behavior is normal. Judgment and thought content normal.    ED Course  Procedures (including critical care time)  Labs Reviewed  CBC - Abnormal; Notable for the following:    Hemoglobin 11.6 (*)    All other components within normal limits  DIFFERENTIAL  I-STAT, CHEM 8   Dg Chest 2 View  02/22/2011  *RADIOLOGY REPORT*  Clinical Data: Shortness of breath, chest pain, and fever.  CHEST - 2 VIEW  Comparison: 06/01/2010 and 06/24/2007  Findings: Mild cardiomegaly, unchanged.  Vascularity is normal and the lungs are clear.  No significant osseous abnormality.  IMPRESSION: No acute disease.  Mild chronic cardiomegaly.  Original Report Authenticated By: Gwynn Burly, M.D.    Date: 02/22/2011  Rate: 48  Rhythm: sinus bradycardia  QRS Axis: normal  Intervals: normal  ST/T Wave abnormalities: normal  Conduction Disutrbances:none  Narrative Interpretation: Tall R wave in V2, non-specific changes - reviewed by Dr. Ethelda Chick  Old EKG Reviewed: changes noted  Chest wall pain    MDM  Patient with non-cardiac like chest wall pain.  Reports improvement in pain  after hydrocodone.  Plan to wait on cardiac enzymes then discharge home.        Izola Price [place], [place] 02/22/11 1555

## 2011-02-22 NOTE — ED Notes (Signed)
Rx and d/c orders reviewed with the pt and family who express verbal understanding.

## 2011-02-22 NOTE — ED Provider Notes (Addendum)
As of right-sided lateral rib pain by cough nasal congestion and sore throat this is accompanied by subjective fever for 3 days no known fever pain is worse with moving or twisting her torso feels improved over a few days ago no treatment and here. The auscultation heart regular rhythm no distress. EKG changes noted.Not felt to Correlate clinically to acute coronary syndrome   Doug Sou, MD 02/22/11 1438  Doug Sou, MD 02/22/11 [phone]

## 2011-02-22 NOTE — ED Notes (Signed)
Pt states she had a cough x 1 week and was sneezing felt better and then sat night she started to have rt side pain still coughing a lot also states she had a sore throat

## 2011-02-22 NOTE — ED Provider Notes (Signed)
Medical screening examination/treatment/procedure(s) were conducted as a shared visit with non-physician practitioner(s) and myself.  I personally evaluated the patient during the encounter  Mandy Fitzwater, MD 02/22/11 1639 

## 2011-04-05 ENCOUNTER — Other Ambulatory Visit: Payer: Self-pay | Admitting: Internal Medicine

## 2011-04-05 DIAGNOSIS — Z1231 Encounter for screening mammogram for malignant neoplasm of breast: Secondary | ICD-10-CM

## 2011-04-12 DIAGNOSIS — M542 Cervicalgia: Secondary | ICD-10-CM | POA: Diagnosis not present

## 2011-04-20 ENCOUNTER — Emergency Department (HOSPITAL_COMMUNITY): Payer: Medicare Other

## 2011-04-20 ENCOUNTER — Encounter (HOSPITAL_COMMUNITY): Payer: Self-pay | Admitting: *Deleted

## 2011-04-20 ENCOUNTER — Observation Stay (HOSPITAL_COMMUNITY)
Admission: EM | Admit: 2011-04-20 | Discharge: 2011-04-21 | Disposition: A | Discharge status: Home / Self Care | Payer: Medicare Other | Attending: Internal Medicine | Admitting: Internal Medicine

## 2011-04-20 ENCOUNTER — Other Ambulatory Visit: Payer: Self-pay

## 2011-04-20 DIAGNOSIS — I6350 Cerebral infarction due to unspecified occlusion or stenosis of unspecified cerebral artery: Secondary | ICD-10-CM | POA: Diagnosis not present

## 2011-04-20 DIAGNOSIS — M5412 Radiculopathy, cervical region: Secondary | ICD-10-CM | POA: Insufficient documentation

## 2011-04-20 DIAGNOSIS — Z8673 Personal history of transient ischemic attack (TIA), and cerebral infarction without residual deficits: Secondary | ICD-10-CM | POA: Diagnosis not present

## 2011-04-20 DIAGNOSIS — Z79899 Other long term (current) drug therapy: Secondary | ICD-10-CM | POA: Diagnosis not present

## 2011-04-20 DIAGNOSIS — K219 Gastro-esophageal reflux disease without esophagitis: Secondary | ICD-10-CM | POA: Diagnosis not present

## 2011-04-20 DIAGNOSIS — I10 Essential (primary) hypertension: Secondary | ICD-10-CM | POA: Insufficient documentation

## 2011-04-20 DIAGNOSIS — E782 Mixed hyperlipidemia: Secondary | ICD-10-CM | POA: Diagnosis not present

## 2011-04-20 DIAGNOSIS — R079 Chest pain, unspecified: Secondary | ICD-10-CM | POA: Diagnosis present

## 2011-04-20 DIAGNOSIS — R0789 Other chest pain: Secondary | ICD-10-CM | POA: Diagnosis not present

## 2011-04-20 DIAGNOSIS — M542 Cervicalgia: Secondary | ICD-10-CM | POA: Diagnosis not present

## 2011-04-20 DIAGNOSIS — R0602 Shortness of breath: Secondary | ICD-10-CM | POA: Insufficient documentation

## 2011-04-20 DIAGNOSIS — E7800 Pure hypercholesterolemia, unspecified: Secondary | ICD-10-CM | POA: Diagnosis not present

## 2011-04-20 DIAGNOSIS — I639 Cerebral infarction, unspecified: Secondary | ICD-10-CM | POA: Diagnosis present

## 2011-04-20 HISTORY — DX: Pure hypercholesterolemia, unspecified: E78.00

## 2011-04-20 LAB — CBC
HCT: 39.5 % (ref 36.0–46.0)
MCH: 28.5 pg (ref 26.0–34.0)
MCHC: 31.9 g/dL (ref 30.0–36.0)
MCV: 89.4 fL (ref 78.0–100.0)
RDW: 13.8 % (ref 11.5–15.5)

## 2011-04-20 LAB — COMPREHENSIVE METABOLIC PANEL
Albumin: 3.1 g/dL — ABNORMAL LOW (ref 3.5–5.2)
BUN: 11 mg/dL (ref 6–23)
Calcium: 9 mg/dL (ref 8.4–10.5)
Creatinine, Ser: 0.75 mg/dL (ref 0.50–1.10)
GFR calc Af Amer: >90 mL/min (ref >90)
Total Protein: 6.8 g/dL (ref 6.0–8.3)

## 2011-04-20 LAB — POCT I-STAT TROPONIN I
Troponin i, poc: 0 ng/mL (ref 0.00–0.08)
Troponin i, poc: 0 ng/mL (ref 0.00–0.08)

## 2011-04-20 LAB — PROTIME-INR
INR: 1.05 (ref 0.00–1.49)
Prothrombin Time: 13.9 seconds (ref 11.6–15.2)

## 2011-04-20 MED ORDER — ROSUVASTATIN CALCIUM 20 MG PO TABS
20.0000 mg | ORAL_TABLET | Freq: Every day | ORAL | Status: DC
  Administered 2011-04-20: 20 mg via ORAL
  Filled 2011-04-20 (×2): qty 1

## 2011-04-20 MED ORDER — CHOLECALCIFEROL 10 MCG (400 UNIT) PO TABS
400.0000 [IU] | ORAL_TABLET | Freq: Every day | ORAL | Status: DC
  Administered 2011-04-20: 400 [IU] via ORAL
  Filled 2011-04-20 (×2): qty 1

## 2011-04-20 MED ORDER — TRAMADOL HCL 50 MG PO TABS: 50.0000 mg | ORAL_TABLET | Freq: Four times a day (QID) | ORAL | Status: DC | PRN

## 2011-04-20 MED ORDER — LISINOPRIL 10 MG PO TABS
10.0000 mg | ORAL_TABLET | Freq: Every day | ORAL | Status: DC
  Filled 2011-04-20 (×2): qty 1

## 2011-04-20 MED ORDER — ACETAMINOPHEN 325 MG PO TABS
650.0000 mg | ORAL_TABLET | Freq: Four times a day (QID) | ORAL | Status: DC | PRN
  Administered 2011-04-20 (×2): 650 mg via ORAL
  Filled 2011-04-20 (×2): qty 2

## 2011-04-20 MED ORDER — METOPROLOL SUCCINATE ER 25 MG PO TB24
25.0000 mg | ORAL_TABLET | Freq: Every day | ORAL | Status: DC
  Filled 2011-04-20 (×2): qty 1

## 2011-04-20 MED ORDER — PREDNISOLONE ACETATE 1 % OP SUSP
1.0000 [drp] | Freq: Four times a day (QID) | OPHTHALMIC | Status: DC
  Administered 2011-04-20 (×2): 1 [drp] via OPHTHALMIC
  Filled 2011-04-20: qty 1

## 2011-04-20 MED ORDER — SODIUM CHLORIDE 0.9 % IV SOLN
20.0000 mL | INTRAVENOUS | Status: DC
  Administered 2011-04-20: 20 mL via INTRAVENOUS

## 2011-04-20 MED ORDER — KETOROLAC TROMETHAMINE 0.5 % OP SOLN
1.0000 [drp] | Freq: Four times a day (QID) | OPHTHALMIC | Status: DC
  Administered 2011-04-20 (×2): 1 [drp] via OPHTHALMIC
  Filled 2011-04-20: qty 3

## 2011-04-20 MED ORDER — ASPIRIN EC 325 MG PO TBEC
325.0000 mg | DELAYED_RELEASE_TABLET | Freq: Every day | ORAL | Status: DC
  Administered 2011-04-20: 325 mg via ORAL
  Filled 2011-04-20 (×2): qty 1

## 2011-04-20 MED ORDER — VITAMIN D (CHOLECALCIFEROL) 10 MCG (400 UNIT) PO CHEW: 1.0000 | CHEWABLE_TABLET | Freq: Every day | ORAL | Status: DC

## 2011-04-20 MED ORDER — ENOXAPARIN SODIUM 40 MG/0.4ML ~~LOC~~ SOLN
40.0000 mg | SUBCUTANEOUS | Status: DC
  Administered 2011-04-20: 40 mg via SUBCUTANEOUS
  Filled 2011-04-20 (×2): qty 0.4

## 2011-04-20 MED ORDER — NITROGLYCERIN 2 % TD OINT
1.0000 [in_us] | TOPICAL_OINTMENT | Freq: Four times a day (QID) | TRANSDERMAL | Status: DC
  Administered 2011-04-20 – 2011-04-21 (×3): 1 [in_us] via TOPICAL
  Filled 2011-04-20 (×3): qty 30

## 2011-04-20 MED ORDER — HYDROCODONE-ACETAMINOPHEN 5-325 MG PO TABS
1.0000 | ORAL_TABLET | Freq: Four times a day (QID) | ORAL | Status: DC | PRN
  Administered 2011-04-21: 1 via ORAL
  Filled 2011-04-20: qty 1

## 2011-04-20 MED ORDER — ACETAMINOPHEN 650 MG RE SUPP: 650.0000 mg | Freq: Four times a day (QID) | RECTAL | Status: DC | PRN

## 2011-04-20 MED ORDER — ASPIRIN 81 MG PO CHEW
324.0000 mg | CHEWABLE_TABLET | Freq: Once | ORAL | Status: DC
  Filled 2011-04-20: qty 4

## 2011-04-20 NOTE — ED Notes (Signed)
Dr. Lynelle Doctor notified that pt is refusing aspirin and nitro.

## 2011-04-20 NOTE — ED Notes (Signed)
Pt is A/O x4. Skin warm and dry. Respirations even and unlabored. NAD noted at this time.   

## 2011-04-20 NOTE — ED Provider Notes (Signed)
History     CSN: [id number]  Arrival date & time 04/20/11  1106   First MD Initiated Contact with Patient 04/20/11 1123      Chief Complaint  Patient presents with  . Chest Pain  . Neck Pain    (Consider location/radiation/quality/duration/timing/severity/associated sxs/prior treatment) Patient is a 77 y.o. female presenting with chest pain and neck pain.  Chest Pain  Her family medical history is significant for CAD in family.    Neck Pain  Associated symptoms include chest pain.  Pt had been having pain in her neck going down her arm for the last week.  She has been diagnosed her with a pinched nerve, disc problem.  This morning it was different and she woke up with sweating and pain in her chest.  The pain in her chest was pressure like.  Some dyspnea, no nausea.  No history of heart disease.  No pain now.  It lasted for  Hours.  She states she does not have history of heart disease and in the past had a stress test.  Past Medical History  Diagnosis Date  . Hypertension   . Stroke   . High cholesterol     Past Surgical History  Procedure Date  . Abdominal hysterectomy   . Cesarean section     No family history on file.  History  Substance Use Topics  . Smoking status: Never Smoker   . Smokeless tobacco: Not on file  . Alcohol Use: No    OB History    Grav Para Term Preterm Abortions TAB SAB Ect Mult Living                  Review of Systems  HENT: Positive for neck pain.   Cardiovascular: Positive for chest pain.  All other systems reviewed and are negative.    Allergies  Percocet  Home Medications   Current Outpatient Rx  Name Route Sig Dispense Refill  . ASPIRIN EC 81 MG PO TBEC Oral Take 81 mg by mouth daily.      . ATORVASTATIN CALCIUM 40 MG PO TABS Oral Take 40 mg by mouth daily.      Marland Kitchen KETOROLAC TROMETHAMINE 0.5 % OP SOLN Both Eyes Place 1 drop into both eyes 4 (four) times daily.      Marland Kitchen LISINOPRIL 10 MG PO TABS Oral Take 10 mg by mouth  daily.      Marland Kitchen METOPROLOL SUCCINATE ER 50 MG PO TB24 Oral Take 50 mg by mouth daily.      Marland Kitchen PREDNISOLONE ACETATE 1 % OP SUSP Both Eyes Place 1 drop into both eyes 4 (four) times daily.      Marland Kitchen VITAMIN D (CHOLECALCIFEROL) PO Oral Take 1 tablet by mouth daily.        BP 132/71  Temp(Src) 98 F (36.7 C) (Oral)  Resp 14  SpO2 100%  Physical Exam  Nursing note and vitals reviewed. Constitutional: She appears well-developed and well-nourished. No distress.  HENT:  Head: Normocephalic and atraumatic.  Right Ear: External ear normal.  Left Ear: External ear normal.  Eyes: Conjunctivae are normal. Right eye exhibits no discharge. Left eye exhibits no discharge. No scleral icterus.  Neck: Neck supple. No tracheal deviation present.       paraspinal ttp left neck  Cardiovascular: Normal rate, regular rhythm and intact distal pulses.   Pulmonary/Chest: Effort normal and breath sounds normal. No stridor. No respiratory distress. She has no wheezes. She has no rales.  Abdominal: Soft. Bowel sounds are normal. She exhibits no distension. There is no tenderness. There is no rebound and no guarding.  Musculoskeletal: She exhibits no edema and no tenderness.  Neurological: She is alert. She has normal strength. No sensory deficit. Cranial nerve deficit: no gross deficits. She exhibits normal muscle tone. She displays no seizure activity. Coordination normal.  Skin: Skin is warm and dry. No rash noted.  Psychiatric: She has a normal mood and affect.    ED Course  Procedures (including critical care time)  Date: 04/20/2011  Rate: 56  Rhythm: normal sinus rhythm  QRS Axis: normal  Intervals: normal  ST/T Wave abnormalities: normal  Conduction Disutrbances:none  Narrative Interpretation: early precordial rs transition  Old EKG Reviewed: unchanged  Patient refused her aspirin and nitroglycerin.  Labs Reviewed  CBC - Abnormal; Notable for the following:    WBC 11.6 (*)    All other components  within normal limits  COMPREHENSIVE METABOLIC PANEL - Abnormal; Notable for the following:    Glucose, Bld 116 (*)    Albumin 3.1 (*)    GFR calc non Af Amer 81 (*)    All other components within normal limits  PROTIME-INR  APTT  POCT I-STAT TROPONIN I  I-STAT TROPONIN I  I-STAT TROPONIN I  I-STAT TROPONIN I   Dg Chest Portable 1 View  04/20/2011  *RADIOLOGY REPORT*  Clinical Data: Chest pain and shortness of breath.  PORTABLE CHEST - 1 VIEW  Comparison: PA and lateral chest 02/22/2011.  Findings: Heart size is mildly enlarged.  Lungs appear clear.  No pneumothorax or effusion.  IMPRESSION: No acute finding.  Original Report Authenticated By: Bernadene Bell. D'ALESSIO, M.D.     1. Chest pain       MDM  Patient presents with symptoms of chest pain. She does high cholesterol and hypertension. Patient's initial cardiac markers are negative. However her symptoms are concerning for the possibility of acute coronary syndrome. With her age and risk factors I recommend admission for observation and further cardiac risk stratification.       Celene Kras, MD 04/20/11 [phone]

## 2011-04-20 NOTE — H&P (Addendum)
Patient's PCP: Georgann Housekeeper, MD, MD  Chief Complaint: Chest pain  History of Present Illness: Leah Lewis is a 77 y.o. African American female with history of hypertension, CVA, hyperlipidemia who presents with the above complaints.  Patient has been having radicular pain from her neck to her left arm or the last week.  Patient is scheduled to see Dr. Ethelene Hal, with orthopedic surgery for consideration of steroid injection.  Patient had not been taking any aspirin over the last 5 days.  Patient woke up this morning around 3 a.m. with dull left-sided aching chest pain.  She reports that she took something for the pain, likely tramadol, which helped with her pain.  She woke up again this morning at 5 a.m. again with the same left-sided dull achy chest pain that lasted till 1030 a.m.  With the pain she complained of being diaphoretic.  She has some numbness and tingling in the left hand but is likely due to the radicular pain that the patient is having from her neck.  Denies any fevers or chills.  Did complain of shortness of breath with chest pain.  Currently denies any chest pain or shortness of breath after coming to the emergency department.  Denies any abdominal pain, headaches, diarrhea or vision changes.  Complaining of left-sided neck pain which extends to her left shoulder to left forearm.  Meds: Scheduled Meds:   . aspirin  324 mg Oral Once  . nitroGLYCERIN  1 inch Topical Q6H   Continuous Infusions:   . sodium chloride     PRN Meds:. Allergies: Percocet Past Medical History  Diagnosis Date  . Hypertension   . Stroke   . High cholesterol    Past Surgical History  Procedure Date  . Abdominal hysterectomy   . Cesarean section   . Cataract extraction     bilateral   Family History  Problem Relation Age of Onset  . Heart attack Mother   . Pneumonia Father   . Parkinsonism Father    History   Social History  . Marital Status: Single    Spouse Name: N/A    Number of  Children: N/A  . Years of Education: N/A   Occupational History  . Not on file.   Social History Main Topics  . Smoking status: Never Smoker   . Smokeless tobacco: Former Neurosurgeon    Types: Chew    Quit date: 04/19/1981  . Alcohol Use: No  . Drug Use: No  . Sexually Active:    Other Topics Concern  . Not on file   Social History Narrative  . No narrative on file   Review of Systems: All systems reviewed with the patient and positive as per history of present illness, otherwise all other systems are negative.  Physical Exam: Blood pressure 97/64, pulse 66, temperature 98.7 F (37.1 C), temperature source Oral, resp. rate 26, SpO2 99.00%. General: Awake, Oriented x3, No acute distress. HEENT: EOMI, Moist mucous membranes Neck: Supple CV: S1 and S2 Lungs: Clear to ascultation bilaterally Abdomen: Soft, Nontender, Nondistended, +bowel sounds. Ext: Good pulses. Trace edema. No clubbing or cyanosis noted. Neuro: Cranial Nerves II-XII grossly intact. Has 5/5 motor strength in upper and lower extremities.  Lab results:  [facility] 04/20/11 1133  NA 138  K 3.8  CL 102  CO2 27  GLUCOSE 116*  BUN 11  CREATININE 0.75  CALCIUM 9.0  MG --  PHOS --    Basename 04/20/11 1133  AST 13  ALT 15  ALKPHOS 76  BILITOT 0.6  PROT 6.8  ALBUMIN 3.1*   No results found for this basename: LIPASE:2,AMYLASE:2 in the last 72 hours  Basename 04/20/11 1133  WBC 11.6*  NEUTROABS --  HGB 12.6  HCT 39.5  MCV 89.4  PLT 210   No results found for this basename: CKTOTAL:3,CKMB:3,CKMBINDEX:3,TROPONINI:3 in the last 72 hours No components found with this basename: POCBNP:3 No results found for this basename: DDIMER in the last 72 hours No results found for this basename: HGBA1C:2 in the last 72 hours No results found for this basename: CHOL:2,HDL:2,LDLCALC:2,TRIG:2,CHOLHDL:2,LDLDIRECT:2 in the last 72 hours No results found for this basename: TSH,T4TOTAL,FREET3,T3FREE,THYROIDAB in the last  72 hours No results found for this basename: VITAMINB12:2,FOLATE:2,FERRITIN:2,TIBC:2,IRON:2,RETICCTPCT:2 in the last 72 hours Imaging results:  Dg Chest Portable 1 View  04/20/2011  *RADIOLOGY REPORT*  Clinical Data: Chest pain and shortness of breath.  PORTABLE CHEST - 1 VIEW  Comparison: PA and lateral chest 02/22/2011.  Findings: Heart size is mildly enlarged.  Lungs appear clear.  No pneumothorax or effusion.  IMPRESSION: No acute finding.  Original Report Authenticated By: Bernadene Bell. Maricela Curet, M.D.   Other results: EKG: normal EKG, normal sinus rhythm, unchanged from previous tracings.  Assessment & Plan by Problem: 1. Chest pain.  Will admit the patient to telemetry.  Will rule the patient out for acute coronary syndrome by pending troponins.  Will send for lipid panel in the morning for risk stratification.  Continue aspirin and continue beta blocker.  Patient has not taken aspirin over the last 5 days due to steroid injection scheduled for next tomorrow in Dr. Grant Fontana office.  Patient reports that she had a stress test about 2 years ago and was negative at that time.  Will defer to patient's PCP to determine any further cardiac ischemic workup.  2. Hypertension.  Continue home medications with hold parameters.  Continue lisinopril and metoprolol.  3. History of stroke.  Continue aspirin.  Not having any acute symptoms at this time.  4. Hyperlipidemia.  Continue statin.  We'll check fasting lipids in the morning.  5. Cervical radiculopathy.  Contacted Dr. Ethelene Hal, with [facility] orthopedics, and left the message informing of patient's admission.  Will defer to Dr. Ethelene Hal if he wants to see the patient in patient for consideration of steroid injection.  6.  Prophylaxis.  Lovenox for DVT prophylaxis.  7.  CODE STATUS.  Full code.  Time spent on admission, talking to the patient, and coordinating care was: 45 mins.  Hurbert Duran A, MD 04/20/2011, 3:46 PM  Addendum: Dr. Grant Fontana office  called back and stated that if the patient could be discharged before 10 o'clock tomorrow patient can likely be seen in Dr. Grant Fontana office for a steroid injection.  If the patient cannot be discharged by that time Dr. Grant Fontana office requested if interventional radiology can give the steroid injection, will defer to her primary care physician to make a decision in the morning after evaluation.  Aquita Simmering A, MD 04/20/2011, 5:17 PM

## 2011-04-20 NOTE — ED Notes (Signed)
Patient is resting comfortably.  NAD noted. Has no needs at this time.  Family at b/s.  Will cont to monitor.  Lights out for comfort.

## 2011-04-20 NOTE — ED Notes (Signed)
Pt reports back pain x1 week, Hx of back problems. Pain worsened early this am, developing into chest pain. Described as achy. Associated with shob and diaphoresis. Denies n/v.

## 2011-04-20 NOTE — ED Notes (Signed)
Dr. Betti Cruz at bedside for admission

## 2011-04-20 NOTE — Progress Notes (Signed)
Pt arrived on unit on stretcher. Amb to bed w/ walker and one min assist. A&Ox4. No c/o at present. Denies CP/SOB. Oriented to callbell and environment. Dinner ordered.

## 2011-04-21 DIAGNOSIS — I10 Essential (primary) hypertension: Secondary | ICD-10-CM | POA: Diagnosis not present

## 2011-04-21 DIAGNOSIS — M542 Cervicalgia: Secondary | ICD-10-CM | POA: Diagnosis not present

## 2011-04-21 DIAGNOSIS — K219 Gastro-esophageal reflux disease without esophagitis: Secondary | ICD-10-CM | POA: Diagnosis not present

## 2011-04-21 DIAGNOSIS — R0789 Other chest pain: Secondary | ICD-10-CM | POA: Diagnosis not present

## 2011-04-21 LAB — CBC
MCH: 27.7 pg (ref 26.0–34.0)
MCHC: 31.1 g/dL (ref 30.0–36.0)
Platelets: 210 10*3/uL (ref 150–400)
RBC: 4.12 MIL/uL (ref 3.87–5.11)

## 2011-04-21 LAB — BASIC METABOLIC PANEL
BUN: 14 mg/dL (ref 6–23)
Calcium: 8.7 mg/dL (ref 8.4–10.5)
GFR calc non Af Amer: 65 mL/min — ABNORMAL LOW (ref >90)
Glucose, Bld: 122 mg/dL — ABNORMAL HIGH (ref 70–99)
Sodium: 138 mEq/L (ref 135–145)

## 2011-04-21 LAB — LIPID PANEL: Cholesterol: 155 mg/dL (ref 0–200)

## 2011-04-21 MED ORDER — OMEPRAZOLE 20 MG PO CPDR: 20.0000 mg | DELAYED_RELEASE_CAPSULE | Freq: Every day | ORAL | Status: DC

## 2011-04-21 MED ORDER — ASPIRIN EC 81 MG PO TBEC: 81.0000 mg | DELAYED_RELEASE_TABLET | Freq: Every day | ORAL | Status: DC

## 2011-04-21 NOTE — Progress Notes (Signed)
Received report from prior RN.  No changes to assessment. Pt resting in bed and denies any needs at this time. Will continue to monitor pt.  Manson Passey, Erinn Mendosa Cherie

## 2011-04-21 NOTE — Discharge Summary (Signed)
Physician Discharge Summary  Patient ID: Leah Lewis MRN: [id number] DOB/AGE: [id number] 75 y.o.  Admit date: 04/20/2011 Discharge date: 04/21/2011  Admission Diagnoses:  Discharge Diagnoses:  Principal Problem:  *Chest pain GERD Active Problems:  Hypertension  Stroke  High cholesterol  Cervical radiculopathy   Discharged Condition: good  Hospital Course: 77 female with CP; atypical; h/o HTN, DJD, GERD C/o GERD symptoms Awaiting neck steriod inj for DJD and pain CP:  Telemetry normal sinus R/o - negative troponin; EKG and CXR normal No Pain. No SOB HTN BP ok GERD start on omeprazole 20 mg  Cervical DJD: steroid injection by Dr Ethelene Hal out pt.   Consults: none  Significant Diagnostic Studies: labs: ok Treatments: IV hydration  Discharge Exam: Blood pressure 107/65, pulse 57, temperature 97.8 F (36.6 C), temperature source Oral, resp. rate 16, height 5\' 4"  (1.626 m), weight 76 kg (167 lb 8.8 oz), SpO2 98.00%. General appearance: alert  Disposition: Home or Self Care  Discharge Orders    Future Appointments: Provider: Department: Dept Phone: Center:   05/17/2011 11:00 AM [facility] [phone] [facility]     Future Orders Please Complete By Expires   Diet - low sodium heart healthy      Increase activity slowly        Medication List  As of 04/21/2011  7:16 AM   TAKE these medications         aspirin EC 81 MG tablet   Take 1 tablet (81 mg total) by mouth daily.      atorvastatin 40 MG tablet   Commonly known as: LIPITOR   Take 40 mg by mouth daily.      ketorolac 0.5 % ophthalmic solution   Commonly known as: ACULAR   Place 1 drop into both eyes 4 (four) times daily.      lisinopril 10 MG tablet   Commonly known as: PRINIVIL,ZESTRIL   Take 10 mg by mouth daily.      metoprolol succinate 50 MG 24 hr tablet   Commonly known as: TOPROL-XL   Take 25 mg by mouth daily. Pt takes 1/2 tab for 25mg       omeprazole 20 MG capsule   Commonly known as: PRILOSEC   Take 1 capsule (20 mg total) by mouth daily.      prednisoLONE acetate 1 % ophthalmic suspension   Commonly known as: PRED FORTE   Place 1 drop into both eyes 4 (four) times daily.      VITAMIN D (CHOLECALCIFEROL) PO   Take 1 tablet by mouth daily.           Follow-up Information    Follow up with Georgann Housekeeper, MD. (as scheduled)              SignedGeorgann Housekeeper 04/21/2011, 7:16 AM

## 2011-05-17 ENCOUNTER — Ambulatory Visit
Admission: RE | Admit: 2011-05-17 | Discharge: 2011-05-17 | Disposition: A | Discharge status: Home / Self Care | Payer: Medicare Other | Source: Ambulatory Visit | Attending: Internal Medicine | Admitting: Internal Medicine

## 2011-05-17 DIAGNOSIS — Z1231 Encounter for screening mammogram for malignant neoplasm of breast: Secondary | ICD-10-CM | POA: Diagnosis not present

## 2011-05-24 DIAGNOSIS — I10 Essential (primary) hypertension: Secondary | ICD-10-CM | POA: Diagnosis not present

## 2011-05-24 DIAGNOSIS — M949 Disorder of cartilage, unspecified: Secondary | ICD-10-CM | POA: Diagnosis not present

## 2011-05-24 DIAGNOSIS — M899 Disorder of bone, unspecified: Secondary | ICD-10-CM | POA: Diagnosis not present

## 2011-05-24 DIAGNOSIS — M1990 Unspecified osteoarthritis, unspecified site: Secondary | ICD-10-CM | POA: Diagnosis not present

## 2011-05-24 DIAGNOSIS — I6789 Other cerebrovascular disease: Secondary | ICD-10-CM | POA: Diagnosis not present

## 2011-05-24 DIAGNOSIS — R7309 Other abnormal glucose: Secondary | ICD-10-CM | POA: Diagnosis not present

## 2011-05-24 DIAGNOSIS — Z1331 Encounter for screening for depression: Secondary | ICD-10-CM | POA: Diagnosis not present

## 2011-05-24 DIAGNOSIS — E782 Mixed hyperlipidemia: Secondary | ICD-10-CM | POA: Diagnosis not present

## 2011-09-14 DIAGNOSIS — M1990 Unspecified osteoarthritis, unspecified site: Secondary | ICD-10-CM | POA: Diagnosis not present

## 2011-09-14 DIAGNOSIS — I10 Essential (primary) hypertension: Secondary | ICD-10-CM | POA: Diagnosis not present

## 2011-09-14 DIAGNOSIS — R7309 Other abnormal glucose: Secondary | ICD-10-CM | POA: Diagnosis not present

## 2011-09-14 DIAGNOSIS — M949 Disorder of cartilage, unspecified: Secondary | ICD-10-CM | POA: Diagnosis not present

## 2011-09-14 DIAGNOSIS — I6789 Other cerebrovascular disease: Secondary | ICD-10-CM | POA: Diagnosis not present

## 2011-09-14 DIAGNOSIS — E782 Mixed hyperlipidemia: Secondary | ICD-10-CM | POA: Diagnosis not present

## 2011-11-26 DIAGNOSIS — H4440 Unspecified hypotony of eye: Secondary | ICD-10-CM | POA: Diagnosis not present

## 2011-11-26 DIAGNOSIS — H4710 Unspecified papilledema: Secondary | ICD-10-CM | POA: Diagnosis not present

## 2011-11-26 DIAGNOSIS — H44119 Panuveitis, unspecified eye: Secondary | ICD-10-CM | POA: Diagnosis not present

## 2011-11-26 DIAGNOSIS — H2700 Aphakia, unspecified eye: Secondary | ICD-10-CM | POA: Diagnosis not present

## 2012-01-20 DIAGNOSIS — Z23 Encounter for immunization: Secondary | ICD-10-CM | POA: Diagnosis not present

## 2012-02-01 DIAGNOSIS — R109 Unspecified abdominal pain: Secondary | ICD-10-CM | POA: Diagnosis not present

## 2012-02-01 DIAGNOSIS — M169 Osteoarthritis of hip, unspecified: Secondary | ICD-10-CM | POA: Diagnosis not present

## 2012-02-23 DIAGNOSIS — M810 Age-related osteoporosis without current pathological fracture: Secondary | ICD-10-CM | POA: Diagnosis not present

## 2012-02-23 DIAGNOSIS — R7309 Other abnormal glucose: Secondary | ICD-10-CM | POA: Diagnosis not present

## 2012-02-23 DIAGNOSIS — E782 Mixed hyperlipidemia: Secondary | ICD-10-CM | POA: Diagnosis not present

## 2012-02-23 DIAGNOSIS — I10 Essential (primary) hypertension: Secondary | ICD-10-CM | POA: Diagnosis not present

## 2012-02-23 DIAGNOSIS — M1990 Unspecified osteoarthritis, unspecified site: Secondary | ICD-10-CM | POA: Diagnosis not present

## 2012-02-23 DIAGNOSIS — Z1331 Encounter for screening for depression: Secondary | ICD-10-CM | POA: Diagnosis not present

## 2012-02-23 DIAGNOSIS — Z0000 Encounter for general adult medical examination without abnormal findings: Secondary | ICD-10-CM | POA: Diagnosis not present

## 2012-02-23 DIAGNOSIS — I6990 Unspecified sequelae of unspecified cerebrovascular disease: Secondary | ICD-10-CM | POA: Diagnosis not present

## 2012-03-14 DIAGNOSIS — M1710 Unilateral primary osteoarthritis, unspecified knee: Secondary | ICD-10-CM | POA: Diagnosis not present

## 2012-04-26 ENCOUNTER — Other Ambulatory Visit: Payer: Self-pay | Admitting: Internal Medicine

## 2012-04-26 DIAGNOSIS — Z1231 Encounter for screening mammogram for malignant neoplasm of breast: Secondary | ICD-10-CM

## 2012-05-25 ENCOUNTER — Ambulatory Visit
Admission: RE | Admit: 2012-05-25 | Discharge: 2012-05-25 | Disposition: A | Discharge status: Home / Self Care | Payer: Medicare Other | Source: Ambulatory Visit | Attending: Internal Medicine | Admitting: Internal Medicine

## 2012-05-25 DIAGNOSIS — Z1231 Encounter for screening mammogram for malignant neoplasm of breast: Secondary | ICD-10-CM | POA: Diagnosis not present

## 2012-07-19 DIAGNOSIS — N390 Urinary tract infection, site not specified: Secondary | ICD-10-CM | POA: Diagnosis not present

## 2012-07-26 DIAGNOSIS — M2010 Hallux valgus (acquired), unspecified foot: Secondary | ICD-10-CM | POA: Diagnosis not present

## 2012-07-26 DIAGNOSIS — M21619 Bunion of unspecified foot: Secondary | ICD-10-CM | POA: Diagnosis not present

## 2012-08-22 DIAGNOSIS — R7309 Other abnormal glucose: Secondary | ICD-10-CM | POA: Diagnosis not present

## 2012-08-22 DIAGNOSIS — I6990 Unspecified sequelae of unspecified cerebrovascular disease: Secondary | ICD-10-CM | POA: Diagnosis not present

## 2012-08-22 DIAGNOSIS — E782 Mixed hyperlipidemia: Secondary | ICD-10-CM | POA: Diagnosis not present

## 2012-08-22 DIAGNOSIS — M1990 Unspecified osteoarthritis, unspecified site: Secondary | ICD-10-CM | POA: Diagnosis not present

## 2012-08-22 DIAGNOSIS — R350 Frequency of micturition: Secondary | ICD-10-CM | POA: Diagnosis not present

## 2012-08-22 DIAGNOSIS — I10 Essential (primary) hypertension: Secondary | ICD-10-CM | POA: Diagnosis not present

## 2012-09-08 DIAGNOSIS — H4710 Unspecified papilledema: Secondary | ICD-10-CM | POA: Diagnosis not present

## 2012-09-08 DIAGNOSIS — H47099 Other disorders of optic nerve, not elsewhere classified, unspecified eye: Secondary | ICD-10-CM | POA: Diagnosis not present

## 2012-09-08 DIAGNOSIS — H2700 Aphakia, unspecified eye: Secondary | ICD-10-CM | POA: Diagnosis not present

## 2012-09-08 DIAGNOSIS — H4440 Unspecified hypotony of eye: Secondary | ICD-10-CM | POA: Diagnosis not present

## 2012-09-08 DIAGNOSIS — H44119 Panuveitis, unspecified eye: Secondary | ICD-10-CM | POA: Diagnosis not present

## 2013-01-12 DIAGNOSIS — H4710 Unspecified papilledema: Secondary | ICD-10-CM | POA: Diagnosis not present

## 2013-01-12 DIAGNOSIS — H2700 Aphakia, unspecified eye: Secondary | ICD-10-CM | POA: Diagnosis not present

## 2013-01-12 DIAGNOSIS — H44119 Panuveitis, unspecified eye: Secondary | ICD-10-CM | POA: Diagnosis not present

## 2013-01-12 DIAGNOSIS — H4440 Unspecified hypotony of eye: Secondary | ICD-10-CM | POA: Diagnosis not present

## 2013-02-22 DIAGNOSIS — I6990 Unspecified sequelae of unspecified cerebrovascular disease: Secondary | ICD-10-CM | POA: Diagnosis not present

## 2013-02-22 DIAGNOSIS — I10 Essential (primary) hypertension: Secondary | ICD-10-CM | POA: Diagnosis not present

## 2013-02-22 DIAGNOSIS — E782 Mixed hyperlipidemia: Secondary | ICD-10-CM | POA: Diagnosis not present

## 2013-02-22 DIAGNOSIS — M1990 Unspecified osteoarthritis, unspecified site: Secondary | ICD-10-CM | POA: Diagnosis not present

## 2013-02-22 DIAGNOSIS — Z0000 Encounter for general adult medical examination without abnormal findings: Secondary | ICD-10-CM | POA: Diagnosis not present

## 2013-02-22 DIAGNOSIS — R7309 Other abnormal glucose: Secondary | ICD-10-CM | POA: Diagnosis not present

## 2013-02-22 DIAGNOSIS — Z23 Encounter for immunization: Secondary | ICD-10-CM | POA: Diagnosis not present

## 2013-02-22 DIAGNOSIS — M810 Age-related osteoporosis without current pathological fracture: Secondary | ICD-10-CM | POA: Diagnosis not present

## 2013-02-22 DIAGNOSIS — Z1331 Encounter for screening for depression: Secondary | ICD-10-CM | POA: Diagnosis not present

## 2013-05-21 ENCOUNTER — Other Ambulatory Visit: Payer: Self-pay

## 2013-05-21 DIAGNOSIS — Z1231 Encounter for screening mammogram for malignant neoplasm of breast: Secondary | ICD-10-CM

## 2013-06-07 ENCOUNTER — Ambulatory Visit: Payer: Medicare Other

## 2013-06-12 ENCOUNTER — Ambulatory Visit
Admission: RE | Admit: 2013-06-12 | Discharge: 2013-06-12 | Disposition: A | Discharge status: Home / Self Care | Payer: Medicare Other | Source: Ambulatory Visit

## 2013-06-12 DIAGNOSIS — Z1231 Encounter for screening mammogram for malignant neoplasm of breast: Secondary | ICD-10-CM

## 2013-06-27 DIAGNOSIS — N3942 Incontinence without sensory awareness: Secondary | ICD-10-CM | POA: Diagnosis not present

## 2013-06-27 DIAGNOSIS — E782 Mixed hyperlipidemia: Secondary | ICD-10-CM | POA: Diagnosis not present

## 2013-06-27 DIAGNOSIS — I10 Essential (primary) hypertension: Secondary | ICD-10-CM | POA: Diagnosis not present

## 2013-06-27 DIAGNOSIS — M810 Age-related osteoporosis without current pathological fracture: Secondary | ICD-10-CM | POA: Diagnosis not present

## 2013-06-27 DIAGNOSIS — I6990 Unspecified sequelae of unspecified cerebrovascular disease: Secondary | ICD-10-CM | POA: Diagnosis not present

## 2013-11-28 DIAGNOSIS — E782 Mixed hyperlipidemia: Secondary | ICD-10-CM | POA: Diagnosis not present

## 2013-11-28 DIAGNOSIS — H6120 Impacted cerumen, unspecified ear: Secondary | ICD-10-CM | POA: Diagnosis not present

## 2013-11-28 DIAGNOSIS — M899 Disorder of bone, unspecified: Secondary | ICD-10-CM | POA: Diagnosis not present

## 2013-11-28 DIAGNOSIS — M949 Disorder of cartilage, unspecified: Secondary | ICD-10-CM | POA: Diagnosis not present

## 2013-11-28 DIAGNOSIS — I10 Essential (primary) hypertension: Secondary | ICD-10-CM | POA: Diagnosis not present

## 2013-12-17 DIAGNOSIS — I10 Essential (primary) hypertension: Secondary | ICD-10-CM | POA: Diagnosis not present

## 2013-12-17 DIAGNOSIS — M949 Disorder of cartilage, unspecified: Secondary | ICD-10-CM | POA: Diagnosis not present

## 2013-12-17 DIAGNOSIS — M899 Disorder of bone, unspecified: Secondary | ICD-10-CM | POA: Diagnosis not present

## 2013-12-17 DIAGNOSIS — H6120 Impacted cerumen, unspecified ear: Secondary | ICD-10-CM | POA: Diagnosis not present

## 2014-01-02 DIAGNOSIS — M169 Osteoarthritis of hip, unspecified: Secondary | ICD-10-CM | POA: Diagnosis not present

## 2014-01-02 DIAGNOSIS — M1610 Unilateral primary osteoarthritis, unspecified hip: Secondary | ICD-10-CM | POA: Diagnosis not present

## 2014-01-02 DIAGNOSIS — R350 Frequency of micturition: Secondary | ICD-10-CM | POA: Diagnosis not present

## 2014-01-02 DIAGNOSIS — M5137 Other intervertebral disc degeneration, lumbosacral region: Secondary | ICD-10-CM | POA: Diagnosis not present

## 2014-01-02 DIAGNOSIS — M6281 Muscle weakness (generalized): Secondary | ICD-10-CM | POA: Diagnosis not present

## 2014-01-02 DIAGNOSIS — Z23 Encounter for immunization: Secondary | ICD-10-CM | POA: Diagnosis not present

## 2014-01-02 DIAGNOSIS — R269 Unspecified abnormalities of gait and mobility: Secondary | ICD-10-CM | POA: Diagnosis not present

## 2014-01-02 DIAGNOSIS — I6990 Unspecified sequelae of unspecified cerebrovascular disease: Secondary | ICD-10-CM | POA: Diagnosis not present

## 2014-01-02 DIAGNOSIS — M549 Dorsalgia, unspecified: Secondary | ICD-10-CM | POA: Diagnosis not present

## 2014-02-08 DIAGNOSIS — H4710 Unspecified papilledema: Secondary | ICD-10-CM | POA: Diagnosis not present

## 2014-02-08 DIAGNOSIS — H2703 Aphakia, bilateral: Secondary | ICD-10-CM | POA: Diagnosis not present

## 2014-02-08 DIAGNOSIS — H4440 Unspecified hypotony of eye: Secondary | ICD-10-CM | POA: Diagnosis not present

## 2014-02-08 DIAGNOSIS — H44113 Panuveitis, bilateral: Secondary | ICD-10-CM | POA: Diagnosis not present

## 2014-02-08 DIAGNOSIS — H3321 Serous retinal detachment, right eye: Secondary | ICD-10-CM | POA: Diagnosis not present

## 2014-03-28 DIAGNOSIS — M1990 Unspecified osteoarthritis, unspecified site: Secondary | ICD-10-CM | POA: Diagnosis not present

## 2014-03-28 DIAGNOSIS — G8191 Hemiplegia, unspecified affecting right dominant side: Secondary | ICD-10-CM | POA: Diagnosis not present

## 2014-03-28 DIAGNOSIS — Z23 Encounter for immunization: Secondary | ICD-10-CM | POA: Diagnosis not present

## 2014-03-28 DIAGNOSIS — M8580 Other specified disorders of bone density and structure, unspecified site: Secondary | ICD-10-CM | POA: Diagnosis not present

## 2014-03-28 DIAGNOSIS — Z1389 Encounter for screening for other disorder: Secondary | ICD-10-CM | POA: Diagnosis not present

## 2014-03-28 DIAGNOSIS — Z0000 Encounter for general adult medical examination without abnormal findings: Secondary | ICD-10-CM | POA: Diagnosis not present

## 2014-03-28 DIAGNOSIS — R7309 Other abnormal glucose: Secondary | ICD-10-CM | POA: Diagnosis not present

## 2014-03-28 DIAGNOSIS — I639 Cerebral infarction, unspecified: Secondary | ICD-10-CM | POA: Diagnosis not present

## 2014-03-28 DIAGNOSIS — E782 Mixed hyperlipidemia: Secondary | ICD-10-CM | POA: Diagnosis not present

## 2014-03-28 DIAGNOSIS — I10 Essential (primary) hypertension: Secondary | ICD-10-CM | POA: Diagnosis not present

## 2014-05-10 ENCOUNTER — Other Ambulatory Visit: Payer: Self-pay

## 2014-05-10 DIAGNOSIS — Z1231 Encounter for screening mammogram for malignant neoplasm of breast: Secondary | ICD-10-CM

## 2014-05-16 DIAGNOSIS — H542 Low vision, both eyes: Secondary | ICD-10-CM | POA: Diagnosis not present

## 2014-05-16 DIAGNOSIS — H548 Legal blindness, as defined in USA: Secondary | ICD-10-CM | POA: Diagnosis not present

## 2014-05-16 DIAGNOSIS — H44113 Panuveitis, bilateral: Secondary | ICD-10-CM | POA: Diagnosis not present

## 2014-06-19 ENCOUNTER — Ambulatory Visit
Admission: RE | Admit: 2014-06-19 | Discharge: 2014-06-19 | Disposition: A | Discharge status: Home / Self Care | Payer: Medicare Other | Source: Ambulatory Visit

## 2014-06-19 DIAGNOSIS — Z1231 Encounter for screening mammogram for malignant neoplasm of breast: Secondary | ICD-10-CM

## 2014-09-30 DIAGNOSIS — M810 Age-related osteoporosis without current pathological fracture: Secondary | ICD-10-CM | POA: Diagnosis not present

## 2014-09-30 DIAGNOSIS — G8191 Hemiplegia, unspecified affecting right dominant side: Secondary | ICD-10-CM | POA: Diagnosis not present

## 2014-09-30 DIAGNOSIS — I10 Essential (primary) hypertension: Secondary | ICD-10-CM | POA: Diagnosis not present

## 2014-09-30 DIAGNOSIS — R300 Dysuria: Secondary | ICD-10-CM | POA: Diagnosis not present

## 2014-09-30 DIAGNOSIS — I639 Cerebral infarction, unspecified: Secondary | ICD-10-CM | POA: Diagnosis not present

## 2014-09-30 DIAGNOSIS — E782 Mixed hyperlipidemia: Secondary | ICD-10-CM | POA: Diagnosis not present

## 2014-09-30 DIAGNOSIS — R2681 Unsteadiness on feet: Secondary | ICD-10-CM | POA: Diagnosis not present

## 2015-03-02 ENCOUNTER — Other Ambulatory Visit: Payer: Self-pay

## 2015-03-02 ENCOUNTER — Emergency Department (HOSPITAL_BASED_OUTPATIENT_CLINIC_OR_DEPARTMENT_OTHER)
Admit: 2015-03-02 | Discharge: 2015-03-02 | Disposition: A | Discharge status: Home / Self Care | Payer: Medicare Other | Attending: Emergency Medicine | Admitting: Emergency Medicine

## 2015-03-02 ENCOUNTER — Encounter (HOSPITAL_COMMUNITY): Payer: Self-pay | Admitting: Emergency Medicine

## 2015-03-02 ENCOUNTER — Inpatient Hospital Stay (HOSPITAL_COMMUNITY)
Admission: EM | Admit: 2015-03-02 | Discharge: 2015-03-04 | DRG: 301 | Disposition: A | Discharge status: Home / Self Care | Payer: Medicare Other | Attending: Internal Medicine | Admitting: Internal Medicine

## 2015-03-02 ENCOUNTER — Inpatient Hospital Stay (HOSPITAL_COMMUNITY): Payer: Medicare Other

## 2015-03-02 DIAGNOSIS — I82432 Acute embolism and thrombosis of left popliteal vein: Secondary | ICD-10-CM | POA: Diagnosis not present

## 2015-03-02 DIAGNOSIS — Z8673 Personal history of transient ischemic attack (TIA), and cerebral infarction without residual deficits: Secondary | ICD-10-CM

## 2015-03-02 DIAGNOSIS — I824Z2 Acute embolism and thrombosis of unspecified deep veins of left distal lower extremity: Secondary | ICD-10-CM | POA: Diagnosis not present

## 2015-03-02 DIAGNOSIS — Z87891 Personal history of nicotine dependence: Secondary | ICD-10-CM

## 2015-03-02 DIAGNOSIS — Z7983 Long term (current) use of bisphosphonates: Secondary | ICD-10-CM

## 2015-03-02 DIAGNOSIS — M7989 Other specified soft tissue disorders: Secondary | ICD-10-CM | POA: Diagnosis not present

## 2015-03-02 DIAGNOSIS — Z79899 Other long term (current) drug therapy: Secondary | ICD-10-CM | POA: Diagnosis not present

## 2015-03-02 DIAGNOSIS — I82409 Acute embolism and thrombosis of unspecified deep veins of unspecified lower extremity: Secondary | ICD-10-CM

## 2015-03-02 DIAGNOSIS — Z7982 Long term (current) use of aspirin: Secondary | ICD-10-CM

## 2015-03-02 DIAGNOSIS — Z23 Encounter for immunization: Secondary | ICD-10-CM

## 2015-03-02 DIAGNOSIS — I82442 Acute embolism and thrombosis of left tibial vein: Secondary | ICD-10-CM | POA: Diagnosis not present

## 2015-03-02 DIAGNOSIS — K219 Gastro-esophageal reflux disease without esophagitis: Secondary | ICD-10-CM | POA: Diagnosis not present

## 2015-03-02 DIAGNOSIS — Z90710 Acquired absence of both cervix and uterus: Secondary | ICD-10-CM

## 2015-03-02 DIAGNOSIS — I82412 Acute embolism and thrombosis of left femoral vein: Principal | ICD-10-CM | POA: Diagnosis present

## 2015-03-02 DIAGNOSIS — E7800 Pure hypercholesterolemia, unspecified: Secondary | ICD-10-CM | POA: Diagnosis present

## 2015-03-02 DIAGNOSIS — I10 Essential (primary) hypertension: Secondary | ICD-10-CM | POA: Diagnosis present

## 2015-03-02 DIAGNOSIS — Z9842 Cataract extraction status, left eye: Secondary | ICD-10-CM

## 2015-03-02 DIAGNOSIS — D649 Anemia, unspecified: Secondary | ICD-10-CM

## 2015-03-02 DIAGNOSIS — E785 Hyperlipidemia, unspecified: Secondary | ICD-10-CM | POA: Diagnosis present

## 2015-03-02 DIAGNOSIS — I82492 Acute embolism and thrombosis of other specified deep vein of left lower extremity: Secondary | ICD-10-CM | POA: Diagnosis present

## 2015-03-02 DIAGNOSIS — Z9841 Cataract extraction status, right eye: Secondary | ICD-10-CM | POA: Diagnosis not present

## 2015-03-02 DIAGNOSIS — I82402 Acute embolism and thrombosis of unspecified deep veins of left lower extremity: Secondary | ICD-10-CM | POA: Diagnosis not present

## 2015-03-02 DIAGNOSIS — D696 Thrombocytopenia, unspecified: Secondary | ICD-10-CM | POA: Diagnosis present

## 2015-03-02 DIAGNOSIS — R2242 Localized swelling, mass and lump, left lower limb: Secondary | ICD-10-CM | POA: Diagnosis not present

## 2015-03-02 DIAGNOSIS — R0602 Shortness of breath: Secondary | ICD-10-CM | POA: Diagnosis not present

## 2015-03-02 LAB — CBC WITH DIFFERENTIAL/PLATELET
Basophils Absolute: 0 10*3/uL (ref 0.0–0.1)
Basophils Relative: 0 %
Eosinophils Absolute: 0.1 10*3/uL (ref 0.0–0.7)
Eosinophils Relative: 1 %
HEMATOCRIT: 35.1 % — AB (ref 36.0–46.0)
Hemoglobin: 11.2 g/dL — ABNORMAL LOW (ref 12.0–15.0)
LYMPHS ABS: 1.9 10*3/uL (ref 0.7–4.0)
LYMPHS PCT: 22 %
MCH: 29.2 pg (ref 26.0–34.0)
MCHC: 31.9 g/dL (ref 30.0–36.0)
MCV: 91.6 fL (ref 78.0–100.0)
Monocytes Absolute: 1.1 10*3/uL — ABNORMAL HIGH (ref 0.1–1.0)
Monocytes Relative: 13 %
NEUTROS ABS: 5.7 10*3/uL (ref 1.7–7.7)
NEUTROS PCT: 64 %
Platelets: 124 10*3/uL — ABNORMAL LOW (ref 150–400)
RBC: 3.83 MIL/uL — AB (ref 3.87–5.11)
RDW: 13.1 % (ref 11.5–15.5)
WBC: 8.8 10*3/uL (ref 4.0–10.5)

## 2015-03-02 LAB — BASIC METABOLIC PANEL
Anion gap: 7 (ref 5–15)
BUN: 8 mg/dL (ref 6–20)
CO2: 29 mmol/L (ref 22–32)
CREATININE: 0.76 mg/dL (ref 0.44–1.00)
Calcium: 8.8 mg/dL — ABNORMAL LOW (ref 8.9–10.3)
Chloride: 104 mmol/L (ref 101–111)
GFR calc Af Amer: >60 mL/min (ref >60)
GFR calc non Af Amer: >60 mL/min (ref >60)
Glucose, Bld: 114 mg/dL — ABNORMAL HIGH (ref 65–99)
POTASSIUM: 3.9 mmol/L (ref 3.5–5.1)
Sodium: 140 mmol/L (ref 135–145)

## 2015-03-02 LAB — PROTIME-INR
INR: 1.19 (ref 0.00–1.49)
PROTHROMBIN TIME: 15.3 s — AB (ref 11.6–15.2)

## 2015-03-02 LAB — APTT: aPTT: 33 seconds (ref 24–37)

## 2015-03-02 MED ORDER — ONDANSETRON HCL 4 MG PO TABS: 4.0000 mg | ORAL_TABLET | Freq: Four times a day (QID) | ORAL | Status: DC | PRN

## 2015-03-02 MED ORDER — KETOROLAC TROMETHAMINE 0.5 % OP SOLN
1.0000 [drp] | Freq: Four times a day (QID) | OPHTHALMIC | Status: DC
  Administered 2015-03-02 – 2015-03-04 (×6): 1 [drp] via OPHTHALMIC
  Filled 2015-03-02: qty 3

## 2015-03-02 MED ORDER — ALENDRONATE SODIUM 70 MG PO TABS: 70.0000 mg | ORAL_TABLET | ORAL | Status: DC

## 2015-03-02 MED ORDER — HEPARIN (PORCINE) IN NACL 100-0.45 UNIT/ML-% IJ SOLN
1000.0000 [IU]/h | INTRAMUSCULAR | Status: DC
  Administered 2015-03-02: 1000 [IU]/h via INTRAVENOUS
  Filled 2015-03-02 (×2): qty 250

## 2015-03-02 MED ORDER — ASPIRIN EC 81 MG PO TBEC
81.0000 mg | DELAYED_RELEASE_TABLET | Freq: Every day | ORAL | Status: DC
  Administered 2015-03-02 – 2015-03-04 (×3): 81 mg via ORAL
  Filled 2015-03-02 (×3): qty 1

## 2015-03-02 MED ORDER — HEPARIN BOLUS VIA INFUSION
2000.0000 [IU] | Freq: Once | INTRAVENOUS | Status: AC
  Administered 2015-03-02: 2000 [IU] via INTRAVENOUS
  Filled 2015-03-02: qty 2000

## 2015-03-02 MED ORDER — VITAMIN D (ERGOCALCIFEROL) 1.25 MG (50000 UNIT) PO CAPS: 50000.0000 [IU] | ORAL_CAPSULE | ORAL | Status: DC

## 2015-03-02 MED ORDER — ATORVASTATIN CALCIUM 40 MG PO TABS
40.0000 mg | ORAL_TABLET | Freq: Every day | ORAL | Status: DC
  Administered 2015-03-02 – 2015-03-03 (×2): 40 mg via ORAL
  Filled 2015-03-02 (×3): qty 1

## 2015-03-02 MED ORDER — ACETAMINOPHEN 325 MG PO TABS
650.0000 mg | ORAL_TABLET | Freq: Four times a day (QID) | ORAL | Status: DC | PRN
Start: 2015-03-02 — End: 2015-03-04

## 2015-03-02 MED ORDER — ACETAMINOPHEN 650 MG RE SUPP
650.0000 mg | Freq: Four times a day (QID) | RECTAL | Status: DC | PRN
Start: 2015-03-02 — End: 2015-03-04

## 2015-03-02 MED ORDER — PREDNISOLONE ACETATE 1 % OP SUSP
1.0000 [drp] | Freq: Four times a day (QID) | OPHTHALMIC | Status: DC
  Administered 2015-03-02 – 2015-03-04 (×6): 1 [drp] via OPHTHALMIC
  Filled 2015-03-02: qty 1

## 2015-03-02 MED ORDER — MUSCLE RUB 10-15 % EX CREA
1.0000 "application " | TOPICAL_CREAM | Freq: Two times a day (BID) | CUTANEOUS | Status: DC | PRN
  Filled 2015-03-02: qty 85

## 2015-03-02 MED ORDER — INFLUENZA VAC SPLIT QUAD 0.5 ML IM SUSY
0.5000 mL | PREFILLED_SYRINGE | INTRAMUSCULAR | Status: AC
  Administered 2015-03-03: 0.5 mL via INTRAMUSCULAR
  Filled 2015-03-02 (×2): qty 0.5

## 2015-03-02 MED ORDER — ONDANSETRON HCL 4 MG/2ML IJ SOLN: 4.0000 mg | Freq: Four times a day (QID) | INTRAMUSCULAR | Status: DC | PRN

## 2015-03-02 MED ORDER — PANTOPRAZOLE SODIUM 40 MG PO TBEC
40.0000 mg | DELAYED_RELEASE_TABLET | Freq: Every day | ORAL | Status: DC
Start: 2015-03-02 — End: 2015-03-04
  Administered 2015-03-02 – 2015-03-04 (×3): 40 mg via ORAL
  Filled 2015-03-02 (×3): qty 1

## 2015-03-02 MED ORDER — SODIUM CHLORIDE 0.9 % IV SOLN: INTRAVENOUS | Status: DC

## 2015-03-02 NOTE — ED Provider Notes (Signed)
CSN: [id number]     Arrival date & time 03/02/15  1217 History   First MD Initiated Contact with Patient 03/02/15 1527     Chief Complaint  Patient presents with  . Leg Swelling     (Consider location/radiation/quality/duration/timing/severity/associated sxs/prior Treatment) HPI Comments: Pt here with 3 days of left le edema--no h/o trauma Denies prior h/o dvt. No recent surgerys No current chest pain or sob Pt uses a walker normally and has been less ambulatory Denies leg pain. Sx persistent and nothing makes them better Called ems and transported here  The history is provided by the patient.    Past Medical History  Diagnosis Date  . Hypertension   . Stroke ([place])   . High cholesterol    Past Surgical History  Procedure Laterality Date  . Abdominal hysterectomy    . Cesarean section    . Cataract extraction      bilateral   Family History  Problem Relation Age of Onset  . Heart attack Mother   . Pneumonia Father   . Parkinsonism Father    Social History  Substance Use Topics  . Smoking status: Never Smoker   . Smokeless tobacco: Former Systems developer    Types: [place] date: 04/19/1981  . Alcohol Use: No   OB History    No data available     Review of Systems  All other systems reviewed and are negative.     Allergies  Percocet  Home Medications   Prior to Admission medications   Medication Sig Start Date End Date Taking? Authorizing Provider  aspirin EC 81 MG tablet Take 1 tablet (81 mg total) by mouth daily. 04/21/11  Yes Wenda Low, MD  atorvastatin (LIPITOR) 40 MG tablet Take 40 mg by mouth daily.      Historical Provider, MD  ketorolac (ACULAR) 0.5 % ophthalmic solution Place 1 drop into both eyes 4 (four) times daily.      Historical Provider, MD  lisinopril (PRINIVIL,ZESTRIL) 10 MG tablet Take 10 mg by mouth daily.      Historical Provider, MD  metoprolol (TOPROL-XL) 50 MG 24 hr tablet Take 25 mg by mouth daily. Pt takes 1/2 tab for 25mg      Historical Provider, MD  omeprazole (PRILOSEC) 20 MG capsule Take 1 capsule (20 mg total) by mouth daily. 04/21/11 04/20/12  Wenda Low, MD  prednisoLONE acetate (PRED FORTE) 1 % ophthalmic suspension Place 1 drop into both eyes 4 (four) times daily.      Historical Provider, MD  VITAMIN D, CHOLECALCIFEROL, PO Take 1 tablet by mouth daily.      Historical Provider, MD   BP 141/82 mmHg  Pulse 76  Temp(Src) 99.8 F (37.7 C) (Oral)  Resp 20  SpO2 100% Physical Exam  Constitutional: She is oriented to person, place, and time. She appears well-developed and well-nourished.  Non-toxic appearance. No distress.  HENT:  Head: Normocephalic and atraumatic.  Eyes: Conjunctivae, EOM and lids are normal. Pupils are equal, round, and reactive to light.  Neck: Normal range of motion. Neck supple. No tracheal deviation present. No thyroid mass present.  Cardiovascular: Normal rate, regular rhythm and normal heart sounds.  Exam reveals no gallop.   No murmur heard. Pulmonary/Chest: Effort normal and breath sounds normal. No stridor. No respiratory distress. She has no decreased breath sounds. She has no wheezes. She has no rhonchi. She has no rales.  Abdominal: Soft. Normal appearance and bowel sounds are normal. She exhibits no distension.  There is no tenderness. There is no rebound and no CVA tenderness.  Musculoskeletal: Normal range of motion. She exhibits no edema or tenderness.  Left le: edema from groin to ankle, no erythema, full range of motion at knee and ankle, neurovasc intact  Neurological: She is alert and oriented to person, place, and time. She has normal strength. No cranial nerve deficit or sensory deficit. GCS eye subscore is 4. GCS verbal subscore is 5. GCS motor subscore is 6.  Skin: Skin is warm and dry. No abrasion and no rash noted.  Psychiatric: She has a normal mood and affect. Her speech is normal and behavior is normal.  Nursing note and vitals reviewed.   ED Course   Procedures (including critical care time) Labs Review Labs Reviewed - No data to display  Imaging Review No results found. I have personally reviewed and evaluated these images and lab results as part of my medical decision-making.   EKG Interpretation None      MDM   Final diagnoses:  None    Patient started on heparin per pharmacy will be admitted to the medicine service  Lacretia Leigh, MD 03/02/15 [phone]

## 2015-03-02 NOTE — Progress Notes (Signed)
ANTICOAGULATION CONSULT NOTE - Initial Consult  Pharmacy Consult for heparin Indication: DVT  Allergies  Allergen Reactions  . Percocet [Oxycodone-Acetaminophen]     "patient can take" but prefers not to    Patient Measurements: Height: 5\' 4"  (162.6 cm) Weight: 160 lb (72.576 kg) IBW/kg (Calculated) : 54.7   Vital Signs: Temp: 99.9 F (37.7 C) (11/27 1901) Temp Source: Oral (11/27 1901) BP: 150/55 mmHg (11/27 1901) Pulse Rate: 68 (11/27 1901)  Labs:  Recent Labs  03/02/15 1745  HGB 11.2*  HCT 35.1*  PLT 124*  APTT 33  LABPROT 15.3*  INR 1.19  CREATININE 0.76    Estimated Creatinine Clearance: 56.6 mL/min (by C-G formula based on Cr of 0.76).   Medical History: Past Medical History  Diagnosis Date  . Hypertension   . Stroke ([place])   . High cholesterol     Medications:  Scheduled:   Infusions:  . sodium chloride      Assessment: 80 yo female presented to ER with left leg swelling found to have extensive, occlusive DVT throughout left lower extremity. To start IV heparin per pharmacy dosing. Baseline Hgb ok and plts low at 124k - monitor closely  Goal of Therapy:  Heparin level 0.3-0.7 units/ml Monitor platelets by anticoagulation protocol: Yes   Plan:  1) IV heparin bolus of 2000 units then 2) IV heparin infusion rate of 1000 units/hr 3) Heparin level 8 hours after start of IV heparin 4) Daily heparin level and CBC   Adrian Saran, PharmD, BCPS Pager [phone] 03/02/2015 7:29 PM

## 2015-03-02 NOTE — ED Notes (Addendum)
Patient transported to X-ray: portable XR

## 2015-03-02 NOTE — ED Notes (Signed)
Dr Zenia Resides at pt bedside

## 2015-03-02 NOTE — Progress Notes (Signed)
VASCULAR LAB PRELIMINARY  PRELIMINARY  PRELIMINARY  PRELIMINARY  Bilateral lower extremity venous duplex completed.    Preliminary report:  There is extensive, acute, occlusive DVT noted throughout the left lower extremity, including the posterior tibial, peroneal, popliteal, femoral, profunda, and common femoral veins.  There is no propagation to the right lower extremity.   Gale Hulse, RVT 03/02/2015, 5:18 PM

## 2015-03-02 NOTE — ED Notes (Signed)
Bed: WTR5 Expected date:  Expected time:  Means of arrival:  Comments: 

## 2015-03-02 NOTE — ED Notes (Signed)
Per EMS: Pt states that Thursday night, lt leg started to swell.  States it has gotten worse.  Warm to touch.  No redness.  Atraumatic.

## 2015-03-02 NOTE — ED Notes (Signed)
Attempt to call report to nurse, will call back in 10 minutes.

## 2015-03-02 NOTE — H&P (Signed)
[facility] History and Physical  Leah Lewis [id number] DOB: [birth year] DOA: 03/02/2015  Referring physician: ED PCP: Wenda Low, MD   Chief Complaint: left leg swelling  HPI:  Mr. Lewis is a 80 year old female with a past medical history significant for HTN, HLD, who presents for a 3 day history of progressively worsening left lower extremity swelling. Symptoms initially started in her left ankle and progressively went up her leg. She became significantly concerned when the swelling progressed to her thigh. She denies any recent history of trauma, or travel. The one thing that she can associate with this is the fact that she's not been doing her regular exercises for the last 2 months or so so she's been more sedentary. She denies having any fever, chills, shortness of breath, cough, or chest pain. Patient started his present at bedside and states that she lives alone and has bad eyesight. Furthermore, she also has hip issues and has had falls. She normally ambulates with a rolling walker with a seat    Review of Systems  Constitutional: Negative for fever and chills.  HENT: Negative for ear pain and tinnitus.   Eyes: Negative for photophobia and pain.  Respiratory: Negative for cough and sputum production.   Cardiovascular: Positive for leg swelling. Negative for orthopnea.  Gastrointestinal: Positive for heartburn. Negative for nausea, vomiting and abdominal pain.  Genitourinary: Negative for dysuria and urgency.  Musculoskeletal: Positive for myalgias, joint pain and falls.  Skin: Negative for itching and rash.  Neurological: Negative for sensory change and speech change.  Psychiatric/Behavioral: Negative for hallucinations and substance abuse.        Past Medical History  Diagnosis Date  . Hypertension   . Stroke ([place])   . High cholesterol      Past Surgical History  Procedure Laterality Date  . Abdominal hysterectomy    . Cesarean section    .  Cataract extraction      bilateral      Social History:  reports that she has never smoked. She quit smokeless tobacco use about 33 years ago. Her smokeless tobacco use included Chew. She reports that she does not drink alcohol or use illicit drugs. Where does patient live--home  and with whom if at home? Alone Can patient participate in ADLs? Yes  Allergies  Allergen Reactions  . Percocet [Oxycodone-Acetaminophen]     "patient can take" but prefers not to    Family History  Problem Relation Age of Onset  . Heart attack Mother   . Pneumonia Father   . Parkinsonism Father      Prior to Admission medications   Medication Sig Start Date End Date Taking? Authorizing Provider  alendronate (FOSAMAX) 70 MG tablet Take 70 mg by mouth once a week. 05/20/12  Yes Historical Provider, MD  aspirin EC 81 MG tablet Take 1 tablet (81 mg total) by mouth daily. 04/21/11  Yes Wenda Low, MD  atorvastatin (LIPITOR) 40 MG tablet Take 40 mg by mouth daily.     Yes Historical Provider, MD  ketorolac (ACULAR) 0.5 % ophthalmic solution Place 1 drop into both eyes 4 (four) times daily.     Yes Historical Provider, MD  Menthol, Topical Analgesic, (ICY HOT EX) Apply 1 application topically 2 (two) times daily.   Yes Historical Provider, MD  prednisoLONE acetate (PRED FORTE) 1 % ophthalmic suspension Place 1 drop into both eyes 4 (four) times daily.     Yes Historical Provider, MD  Vitamin D, Ergocalciferol, (DRISDOL) 50000 UNITS  CAPS capsule Take 50,000 Units by mouth every 30 (thirty) days.   Yes Historical Provider, MD  omeprazole (PRILOSEC) 20 MG capsule Take 1 capsule (20 mg total) by mouth daily. 04/21/11 04/20/12  Wenda Low, MD     Physical Exam: Filed Vitals:   03/02/15 1529 03/02/15 1530 03/02/15 1901 03/02/15 1926  BP: 126/90 141/82 150/55   Pulse: 78 76 68   Temp: 99.8 F (37.7 C)  99.9 F (37.7 C)   TempSrc: Oral  Oral   Resp: 20  18   Height:    5\' 4"  (1.626 m)  Weight:    72.576 kg  (160 lb)  SpO2: 98% 100% 100%      Constitutional: Vital signs reviewed. Patient is a well-developed and well-nourished elderly female in no acute distress and cooperative with exam. Alert and oriented x3.  Head: Normocephalic and atraumatic  Ear: TM normal bilaterally  Mouth: no erythema or exudates, MMM  Eyes: PERRL, EOMI, conjunctivae normal, No scleral icterus.  Neck: Supple, Trachea midline normal ROM, No JVD, mass, thyromegaly, or carotid bruit present.  Cardiovascular: RRR with intermittent PVCs, S1 normal, S2 normal, no MRG, pulses symmetric and intact bilaterally  Pulmonary/Chest: CTAB, no wheezes, rales, or rhonchi  Abdominal: Soft. Non-tender, non-distended, bowel sounds are normal, no masses, organomegaly, or guarding present.  GU: no CVA tenderness Musculoskeletal: No joint deformities, erythema, or stiffness, ROM full and no nontender Ext: Left lower extremity +2 edema going from ankle up to the thigh with mild erythema noted. No cyanosis, pulses palpable bilaterally (DP and PT)  Hematology: no cervical, inginal, or axillary adenopathy.  Neurological: A&O x3, Strenght is normal and symmetric bilaterally, cranial nerve II-XII are grossly intact, no focal motor deficit, sensory intact to light touch bilaterally.  Skin: Warm, dry and intact. No rash, cyanosis, or clubbing.  Psychiatric: Normal mood and affect. speech and behavior is normal. Judgment and thought content normal. Cognition and memory are normal.      Data Review   Micro Results No results found for this or any previous visit (from the past 240 hour(s)).  Radiology Reports No results found.   CBC  Recent Labs Lab 03/02/15 1745  WBC 8.8  HGB 11.2*  HCT 35.1*  PLT 124*  MCV 91.6  MCH 29.2  MCHC 31.9  RDW 13.1  LYMPHSABS 1.9  MONOABS 1.1*  EOSABS 0.1  BASOSABS 0.0    Chemistries   Recent Labs Lab 03/02/15 1745  NA 140  K 3.9  CL 104  CO2 29  GLUCOSE 114*  BUN 8  CREATININE 0.76   CALCIUM 8.8*   ------------------------------------------------------------------------------------------------------------------ estimated creatinine clearance is 56.6 mL/min (by C-G formula based on Cr of 0.76). ------------------------------------------------------------------------------------------------------------------ No results for input(s): HGBA1C in the last 72 hours. ------------------------------------------------------------------------------------------------------------------ No results for input(s): CHOL, HDL, LDLCALC, TRIG, CHOLHDL, LDLDIRECT in the last 72 hours. ------------------------------------------------------------------------------------------------------------------ No results for input(s): TSH, T4TOTAL, T3FREE, THYROIDAB in the last 72 hours.  Invalid input(s): FREET3 ------------------------------------------------------------------------------------------------------------------ No results for input(s): VITAMINB12, FOLATE, FERRITIN, TIBC, IRON, RETICCTPCT in the last 72 hours.  Coagulation profile  Recent Labs Lab 03/02/15 1745  INR 1.19    No results for input(s): DDIMER in the last 72 hours.  Cardiac Enzymes No results for input(s): CKMB, TROPONINI, MYOGLOBIN in the last 168 hours.  Invalid input(s): CK ------------------------------------------------------------------------------------------------------------------ Invalid input(s): POCBNP   CBG: No results for input(s): GLUCAP in the last 168 hours.     EKG: pending    Assessment/Plan Principal Problem:  DVT (deep  venous thrombosis), left: Acute. Patient with symptoms of left lower extremity swelling. U/S shows extensive DVT of the left lower extremity. Due to patient's extensive DVT Pt lives alone with decreased mobility and decreased eyesight will admit to hospital for start anticoagulation therapy. Uncertain patient's ability to administer her own Lovenox. -Chest x-ray, EKG-12  lead -continuous pulse ox -Strict bed rest until able to initiate anticoagulation -heparin per pharmacy -discuss anticoagulation options coumad -Consult care management a.m. for home needs, medications.   Anemia:Stable. Baseline hemoglobin is noted to be 11.4 back in 04/2011, and on admission today 11.2. -cbc in a.m.With the start of anticoagulation therapy  Thrombocytopenia ([place]). Platet count was 124 on admission. Previous platelet counts were within normal limits. -cbc in a.m.   Hypertension: Stable. Blood pressures are relatively well controlled not on any antihypertensive medications. -Continue to monitor    High cholesterol -Continue atorvastatin for dose    GERD (gastroesophageal reflux disease) -home medication of Prilosec switched to Protonix per pharmacist obstetrician   Continue eyedrops  Code Status:   full Family Communication: bedside Disposition Plan: admit   Total time spent 55 minutes.Greater than 50% of this time was spent in counseling, explanation of diagnosis, planning of further management, and coordination of care  [facility] Pager [phone]  If 7PM-7AM, please contact night-coverage [email or web address] Password [email or web address] 03/02/2015, 7:48 PM

## 2015-03-02 NOTE — ED Notes (Signed)
Pt has swelling and pink coloration to left leg. No known injury or deformation. Pt denies pain.

## 2015-03-03 ENCOUNTER — Encounter (HOSPITAL_COMMUNITY): Payer: Self-pay | Admitting: *Deleted

## 2015-03-03 DIAGNOSIS — D649 Anemia, unspecified: Secondary | ICD-10-CM | POA: Diagnosis present

## 2015-03-03 DIAGNOSIS — D696 Thrombocytopenia, unspecified: Secondary | ICD-10-CM | POA: Diagnosis not present

## 2015-03-03 DIAGNOSIS — I82409 Acute embolism and thrombosis of unspecified deep veins of unspecified lower extremity: Secondary | ICD-10-CM | POA: Diagnosis present

## 2015-03-03 DIAGNOSIS — I82432 Acute embolism and thrombosis of left popliteal vein: Secondary | ICD-10-CM | POA: Diagnosis not present

## 2015-03-03 DIAGNOSIS — Z79899 Other long term (current) drug therapy: Secondary | ICD-10-CM | POA: Diagnosis not present

## 2015-03-03 DIAGNOSIS — I82402 Acute embolism and thrombosis of unspecified deep veins of left lower extremity: Secondary | ICD-10-CM | POA: Diagnosis not present

## 2015-03-03 DIAGNOSIS — Z7982 Long term (current) use of aspirin: Secondary | ICD-10-CM | POA: Diagnosis not present

## 2015-03-03 DIAGNOSIS — E7800 Pure hypercholesterolemia, unspecified: Secondary | ICD-10-CM | POA: Diagnosis not present

## 2015-03-03 DIAGNOSIS — I82412 Acute embolism and thrombosis of left femoral vein: Secondary | ICD-10-CM | POA: Diagnosis not present

## 2015-03-03 DIAGNOSIS — K219 Gastro-esophageal reflux disease without esophagitis: Secondary | ICD-10-CM | POA: Diagnosis not present

## 2015-03-03 DIAGNOSIS — Z9842 Cataract extraction status, left eye: Secondary | ICD-10-CM | POA: Diagnosis not present

## 2015-03-03 DIAGNOSIS — Z87891 Personal history of nicotine dependence: Secondary | ICD-10-CM | POA: Diagnosis not present

## 2015-03-03 DIAGNOSIS — Z90710 Acquired absence of both cervix and uterus: Secondary | ICD-10-CM | POA: Diagnosis not present

## 2015-03-03 DIAGNOSIS — I10 Essential (primary) hypertension: Secondary | ICD-10-CM | POA: Diagnosis not present

## 2015-03-03 DIAGNOSIS — Z7983 Long term (current) use of bisphosphonates: Secondary | ICD-10-CM | POA: Diagnosis not present

## 2015-03-03 DIAGNOSIS — E785 Hyperlipidemia, unspecified: Secondary | ICD-10-CM | POA: Diagnosis not present

## 2015-03-03 DIAGNOSIS — I82442 Acute embolism and thrombosis of left tibial vein: Secondary | ICD-10-CM | POA: Diagnosis not present

## 2015-03-03 DIAGNOSIS — Z8673 Personal history of transient ischemic attack (TIA), and cerebral infarction without residual deficits: Secondary | ICD-10-CM | POA: Diagnosis not present

## 2015-03-03 DIAGNOSIS — Z9841 Cataract extraction status, right eye: Secondary | ICD-10-CM | POA: Diagnosis not present

## 2015-03-03 DIAGNOSIS — Z23 Encounter for immunization: Secondary | ICD-10-CM | POA: Diagnosis not present

## 2015-03-03 LAB — BASIC METABOLIC PANEL
ANION GAP: 8 (ref 5–15)
BUN: 9 mg/dL (ref 6–20)
CALCIUM: 8.5 mg/dL — AB (ref 8.9–10.3)
CO2: 28 mmol/L (ref 22–32)
CREATININE: 0.81 mg/dL (ref 0.44–1.00)
Chloride: 104 mmol/L (ref 101–111)
GFR calc non Af Amer: >60 mL/min (ref >60)
Glucose, Bld: 114 mg/dL — ABNORMAL HIGH (ref 65–99)
Potassium: 4.1 mmol/L (ref 3.5–5.1)
SODIUM: 140 mmol/L (ref 135–145)

## 2015-03-03 LAB — CBC
HEMATOCRIT: 35.6 % — AB (ref 36.0–46.0)
Hemoglobin: 10.9 g/dL — ABNORMAL LOW (ref 12.0–15.0)
MCH: 28.2 pg (ref 26.0–34.0)
MCHC: 30.6 g/dL (ref 30.0–36.0)
MCV: 92 fL (ref 78.0–100.0)
Platelets: 131 10*3/uL — ABNORMAL LOW (ref 150–400)
RBC: 3.87 MIL/uL (ref 3.87–5.11)
RDW: 13 % (ref 11.5–15.5)
WBC: 8.6 10*3/uL (ref 4.0–10.5)

## 2015-03-03 LAB — HEPARIN LEVEL (UNFRACTIONATED): HEPARIN UNFRACTIONATED: 0.56 [IU]/mL (ref 0.30–0.70)

## 2015-03-03 MED ORDER — RIVAROXABAN 15 MG PO TABS
15.0000 mg | ORAL_TABLET | Freq: Two times a day (BID) | ORAL | Status: DC
  Administered 2015-03-03 – 2015-03-04 (×3): 15 mg via ORAL
  Filled 2015-03-03 (×5): qty 1

## 2015-03-03 NOTE — Progress Notes (Signed)
Patient ID: Leah Lewis, female   DOB: 1934-12-14, 80 y.o.   MRN: [id number] [facility] PROGRESS NOTE  Leah Lewis [id number] DOB: November 09, 1934 DOA: 03/02/2015 PCP: Leah Low, MD  Brief narrative:    80 year old female with a past medical history significant for dyslipidemia, stroke who presented to [facility] hospital with worsening swelling in left lower extremity for past few days prior to this admission. No reports of trauma to the leg. No reports of fevers or erythema in the leg.  On admission, patient was hemodynamically stable. She was found to have extensive, acute, occlusive DVT noted throughout the left lower extremity, including the posterior tibial, peroneal, popliteal, femoral, profunda, and common femoral veins. She was started on heparin drip.    Assessment/Plan:    Principal Problem: DVT (deep venous thrombosis), left - Pt found to have extensive, acute, occlusive DVT noted throughout the left lower extremity, including the posterior tibial, peroneal, popliteal, femoral, profunda, and common femoral veins. - Not sure what triggered DVT - Patient is on heparin drip - Add xarelto in anticipation for discharge home tomorrow  Active Problems: Dyslipidemia - Continue statin therapy  GERD (gastroesophageal reflux disease) - Continue Protonix  Thrombus cytopenia - Platelets 131. Monitor while patient on anticoagulation.  DVT Prophylaxis  - On heparin drip - Add xarelto in anticipation of discharge tomorrow.  Code Status: Full.  Family Communication:  plan of care discussed with the patient Disposition Plan: Home 03/04/2015.  IV access:  Peripheral IV  Procedures and diagnostic studies:    Dg Chest 1 View 03/02/2015  No acute cardiopulmonary process seen.   LE doppler 03/02/2015 - There is extensive, acute, occlusive DVT noted throughout the left lower extremity, including the posterior tibial, peroneal, popliteal, femoral, profunda, and  common femoral veins. There is no propagation to the right lower extremity.   Medical Consultants:  None   Other Consultants:  None   IAnti-Infectives:   None    [name], MD  [facility] Pager [phone]  Time spent in minutes: 25 minutes  If 7PM-7AM, please contact night-coverage [email or web address] Password [email or web address] 03/03/2015, 11:12 AM   LOS: 1 day    HPI/Subjective: No acute overnight events. Patient reports she feels better.  Objective: Filed Vitals:   03/02/15 1926 03/02/15 2058 03/02/15 2139 03/03/15 0548  BP:  113/49 115/44 111/58  Pulse:  86 79 77  Temp:  98.8 F (37.1 C) 98.1 F (36.7 C) 99 F (37.2 C)  TempSrc:  Oral Oral Oral  Resp:  21 18 18   Height: 5\' 4"  (1.626 m)     Weight: 72.576 kg (160 lb)     SpO2:  98% 99% 99%    Intake/Output Summary (Last 24 hours) at 03/03/15 1112 Last data filed at 03/03/15 0855  Gross per 24 hour  Intake    480 ml  Output      0 ml  Net    480 ml    Exam:   General:  Pt is alert, follows commands appropriately, not in acute distress  Cardiovascular: Regular rate and rhythm, S1/S2, no murmurs  Respiratory: Clear to auscultation bilaterally, no wheezing, no crackles, no rhonchi  Abdomen: Soft, non tender, non distended, bowel sounds present  Extremities: LLE more swollen compared with right, no erythema, pulses DP and PT palpable bilaterally  Neuro: Grossly nonfocal  Data Reviewed: Basic Metabolic Panel:  Recent Labs Lab 03/02/15 1745 03/03/15 0515  NA 140 140  K 3.9 4.1  CL 104 104  CO2  29 28  GLUCOSE 114* 114*  BUN 8 9  CREATININE 0.76 0.81  CALCIUM 8.8* 8.5*   Liver Function Tests: No results for input(s): AST, ALT, ALKPHOS, BILITOT, PROT, ALBUMIN in the last 168 hours. No results for input(s): LIPASE, AMYLASE in the last 168 hours. No results for input(s): AMMONIA in the last 168 hours. CBC:  Recent Labs Lab 03/02/15 1745 03/03/15 0515  WBC 8.8 8.6  NEUTROABS 5.7  --   HGB  11.2* 10.9*  HCT 35.1* 35.6*  MCV 91.6 92.0  PLT 124* 131*   Cardiac Enzymes: No results for input(s): CKTOTAL, CKMB, CKMBINDEX, TROPONINI in the last 168 hours. BNP: Invalid input(s): POCBNP CBG: No results for input(s): GLUCAP in the last 168 hours.  No results found for this or any previous visit (from the past 240 hour(s)).   Scheduled Meds: . aspirin EC  81 mg Oral Daily  . atorvastatin  40 mg Oral q1800  . ketorolac  1 drop Both Eyes QID  . pantoprazole  40 mg Oral Daily  . prednisoLONE acetate  1 drop Both Eyes QID  . [START ON 03/12/2015] Vitamin D (Ergocalciferol)  50,000 Units Oral Q30 days   Continuous Infusions: . sodium chloride    . heparin 1,000 Units/hr (03/02/15 2013)

## 2015-03-03 NOTE — Care Management Obs Status (Signed)
[place] NOTIFICATION   Patient Details  Name: Leah Lewis MRN: [id number] Date of Birth: 09/06/1934   Medicare Observation Status Notification Given:  Yes    Guadalupe Maple, RN 03/03/2015, 3:24 PM

## 2015-03-03 NOTE — Progress Notes (Signed)
ANTICOAGULATION CONSULT NOTE - Initial Consult  Pharmacy Consult for Xarelto Indication: DVT  Allergies  Allergen Reactions  . Percocet [Oxycodone-Acetaminophen]     "patient can take" but prefers not to    Patient Measurements: Height: 5\' 4"  (162.6 cm) Weight: 160 lb (72.576 kg) IBW/kg (Calculated) : 54.7 Heparin Dosing Weight:   Vital Signs: Temp: 99 F (37.2 C) (11/28 0548) Temp Source: Oral (11/28 0548) BP: 111/58 mmHg (11/28 0548) Pulse Rate: 77 (11/28 0548)  Labs:  Recent Labs  03/02/15 1745 03/03/15 0515  HGB 11.2* 10.9*  HCT 35.1* 35.6*  PLT 124* 131*  APTT 33  --   LABPROT 15.3*  --   INR 1.19  --   HEPARINUNFRC  --  0.56  CREATININE 0.76 0.81    Estimated Creatinine Clearance: 55.9 mL/min (by C-G formula based on Cr of 0.81).   Medical History: Past Medical History  Diagnosis Date  . Hypertension   . Stroke ([place])   . High cholesterol     Medications:  Scheduled:  . aspirin EC  81 mg Oral Daily  . atorvastatin  40 mg Oral q1800  . ketorolac  1 drop Both Eyes QID  . pantoprazole  40 mg Oral Daily  . prednisoLONE acetate  1 drop Both Eyes QID  . Rivaroxaban  15 mg Oral BID WC  . [START ON 03/12/2015] Vitamin D (Ergocalciferol)  50,000 Units Oral Q30 days    Assessment: Pharmacy is consulted to dose Xarelto in 80 yo female diagnosed with DVT. She was found to have extensive, acute, occlusive DVT noted throughout the left lower extremity, including the posterior tibial, peroneal, popliteal, femoral, profunda, and common femoral veins. Pt is being transitioned to Xarelto from heparin drip which was started on 11/27. Hgb, Hct stable, plt low but stable.   Goal of Therapy:  Monitor platelets by anticoagulation protocol: Yes   Plan:   Will start Xarelto 15 mg PO BID x 21 days, then continue Xarelto 20 mg PO daily thereafter  Pt education  Monitor s/sx of bleeding  Monitor Hgb, Plt closely.  Royetta Asal, PharmD, BCPS Pager  [phone] 03/03/2015 11:42 AM

## 2015-03-03 NOTE — Progress Notes (Signed)
ANTICOAGULATION CONSULT NOTE - Follow Up Consult  Pharmacy Consult for Heparin Indication: DVT  Allergies  Allergen Reactions  . Percocet [Oxycodone-Acetaminophen]     "patient can take" but prefers not to    Patient Measurements: Height: 5\' 4"  (162.6 cm) Weight: 160 lb (72.576 kg) IBW/kg (Calculated) : 54.7 Heparin Dosing Weight:   Vital Signs: Temp: 99 F (37.2 C) (11/28 0548) Temp Source: Oral (11/28 0548) BP: 111/58 mmHg (11/28 0548) Pulse Rate: 77 (11/28 0548)  Labs:  Recent Labs  03/02/15 1745 03/03/15 0515  HGB 11.2* 10.9*  HCT 35.1* 35.6*  PLT 124* 131*  APTT 33  --   LABPROT 15.3*  --   INR 1.19  --   HEPARINUNFRC  --  0.56  CREATININE 0.76 0.81    Estimated Creatinine Clearance: 55.9 mL/min (by C-G formula based on Cr of 0.81).   Medications:  Infusions:  . sodium chloride    . heparin 1,000 Units/hr (03/02/15 2013)    Assessment: Patient with heparin level at goal.  No heparin issues per RN. Goal of Therapy:  Heparin level 0.3-0.7 units/ml Monitor platelets by anticoagulation protocol: Yes   Plan:  Continue heparin drip at current rate Recheck level at 1300.  Tyler Deis, Shea Stakes [place] 03/03/2015,6:33 AM

## 2015-03-04 DIAGNOSIS — I82402 Acute embolism and thrombosis of unspecified deep veins of left lower extremity: Secondary | ICD-10-CM | POA: Diagnosis not present

## 2015-03-04 DIAGNOSIS — K219 Gastro-esophageal reflux disease without esophagitis: Secondary | ICD-10-CM | POA: Diagnosis not present

## 2015-03-04 DIAGNOSIS — I82409 Acute embolism and thrombosis of unspecified deep veins of unspecified lower extremity: Secondary | ICD-10-CM

## 2015-03-04 DIAGNOSIS — E785 Hyperlipidemia, unspecified: Secondary | ICD-10-CM | POA: Diagnosis not present

## 2015-03-04 DIAGNOSIS — I10 Essential (primary) hypertension: Secondary | ICD-10-CM

## 2015-03-04 DIAGNOSIS — D696 Thrombocytopenia, unspecified: Secondary | ICD-10-CM | POA: Diagnosis not present

## 2015-03-04 MED ORDER — PANTOPRAZOLE SODIUM 40 MG PO TBEC: 40.0000 mg | DELAYED_RELEASE_TABLET | Freq: Every day | ORAL | Status: AC

## 2015-03-04 MED ORDER — RIVAROXABAN (XARELTO) VTE STARTER PACK (15 & 20 MG): ORAL_TABLET | ORAL | Status: AC

## 2015-03-04 NOTE — Discharge Summary (Signed)
Physician Discharge Summary  Leshawn J Martinique [id number] DOB: Jun 20, 1934 DOA: 03/02/2015  PCP: Wenda Low, MD  Admit date: 03/02/2015 Discharge date: 03/04/2015  Recommendations for Outpatient Follow-up:  1. Pt will take starter pack xarelto on discharge and then 20 mg daily on day #22.  Discharge Diagnoses:  Principal Problem:   DVT (deep venous thrombosis), left Active Problems:   Hypertension   High cholesterol   GERD (gastroesophageal reflux disease)   Anemia   Thrombocytopenia (HCC)    Discharge Condition: stable   Diet recommendation: as tolerated   History of present illness:  80 year old female with a past medical history significant for dyslipidemia, stroke who presented to [facility] with worsening swelling in left lower extremity for past few days prior to this admission. No reports of trauma to the leg. No reports of fevers or erythema in the leg.  On admission, patient was hemodynamically stable. She was found to have extensive, acute, occlusive DVT noted throughout the left lower extremity, including the posterior tibial, peroneal, popliteal, femoral, profunda, and common femoral veins. She was started on heparin drip.   Hospital Course:   Assessment/Plan:    Principal Problem: DVT (deep venous thrombosis), left - Found to have extensive, acute, occlusive DVT noted throughout the left lower extremity, including the posterior tibial, peroneal, popliteal, femoral, profunda, and common femoral veins. - Xarelto on discharge, started pack for 21 days and then 20 mg daily  - Swelling much better this am   Active Problems: Dyslipidemia - Continue statin therapy on discharge   GERD (gastroesophageal reflux disease) - Continue Protonix  Thrombus cytopenia - Platelets 131. No bleeding.  DVT Prophylaxis  - On xarelto   Code Status: Full.  Family Communication: plan of care discussed with the patient   IV access:  Peripheral  IV  Procedures and diagnostic studies:   Dg Chest 1 View 03/02/2015 No acute cardiopulmonary process seen.   LE doppler 03/02/2015 - There is extensive, acute, occlusive DVT noted throughout the left lower extremity, including the posterior tibial, peroneal, popliteal, femoral, profunda, and common femoral veins. There is no propagation to the right lower extremity.   Medical Consultants:  None   Other Consultants:  None   IAnti-Infectives:   None   Signed:  [name], MD  [facility] 03/04/2015, 10:32 AM  Pager #: [phone]  Time spent in minutes: less than 30 minutes   Discharge Exam: Filed Vitals:   03/03/15 2124 03/04/15 0520  BP: 119/49 115/62  Pulse: 81 73  Temp: 98.4 F (36.9 C) 98.2 F (36.8 C)  Resp: 20 20   Filed Vitals:   03/03/15 0548 03/03/15 1425 03/03/15 2124 03/04/15 0520  BP: 111/58 131/57 119/49 115/62  Pulse: 77 72 81 73  Temp: 99 F (37.2 C) 99 F (37.2 C) 98.4 F (36.9 C) 98.2 F (36.8 C)  TempSrc: Oral Oral Oral Oral  Resp: 18 20 20 20   Height:      Weight:      SpO2: 99% 99% 95% 97%    General: Pt is alert, follows commands appropriately, not in acute distress Cardiovascular: Regular rate and rhythm, S1/S2 + Respiratory: Clear to auscultation bilaterally, no wheezing, no crackles, no rhonchi Abdominal: Soft, non tender, non distended, bowel sounds +, no guarding Extremities: left leg swelling much improved, no cyanosis, pulses palpable bilaterally DP and PT Neuro: Grossly nonfocal  Discharge Instructions  Discharge Instructions    Call MD for:  difficulty breathing, headache or visual disturbances  Complete by:  As directed      Call MD for:  persistant dizziness or light-headedness    Complete by:  As directed      Call MD for:  persistant nausea and vomiting    Complete by:  As directed      Call MD for:  severe uncontrolled pain    Complete by:  As directed      Diet - low sodium heart  healthy    Complete by:  As directed      Discharge instructions    Complete by:  As directed   After you complete 21 days of xarelto, start taking 20 mg daily xarelto.     Increase activity slowly    Complete by:  As directed             Medication List    STOP taking these medications        aspirin EC 81 MG tablet     omeprazole 20 MG capsule  Commonly known as:  PRILOSEC  Replaced by:  pantoprazole 40 MG tablet      TAKE these medications        alendronate 70 MG tablet  Commonly known as:  FOSAMAX  Take 70 mg by mouth once a week.     atorvastatin 40 MG tablet  Commonly known as:  LIPITOR  Take 40 mg by mouth daily.     ICY HOT EX  Apply 1 application topically 2 (two) times daily.     ketorolac 0.5 % ophthalmic solution  Commonly known as:  ACULAR  Place 1 drop into both eyes 4 (four) times daily.     pantoprazole 40 MG tablet  Commonly known as:  PROTONIX  Take 1 tablet (40 mg total) by mouth daily.     prednisoLONE acetate 1 % ophthalmic suspension  Commonly known as:  PRED FORTE  Place 1 drop into both eyes 4 (four) times daily.     Rivaroxaban 15 & 20 MG Tbpk  Commonly known as:  XARELTO STARTER PACK  Take as directed on package: Start with one 15mg  tablet by mouth twice a day with food. On Day 22, switch to one 20mg  tablet once a day with food.     Vitamin D (Ergocalciferol) 50000 UNITS Caps capsule  Commonly known as:  DRISDOL  Take 50,000 Units by mouth every 30 (thirty) days.           Follow-up Information    Follow up with Wenda Low, MD. Schedule an appointment as soon as possible for a visit in 1 week.   Specialty:  Internal Medicine   Why:  Follow up appt after recent hospitalization   Contact information:   [address] [place] [address] [phone]        The results of significant diagnostics from this hospitalization (including imaging, microbiology, ancillary and laboratory) are listed below for  reference.    Significant Diagnostic Studies: Dg Chest 1 View  03/02/2015  CLINICAL DATA:  Acute onset of shortness of breath and left leg pain and swelling. Known left lower extremity DVT. Initial encounter. EXAM: CHEST 1 VIEW COMPARISON:  Chest radiograph performed 04/20/2011 FINDINGS: The lungs are well-aerated and clear. There is no evidence of focal opacification, pleural effusion or pneumothorax. Mild costochondral calcification is noted overlying the lung bases. The cardiomediastinal silhouette is borderline normal in size. No acute osseous abnormalities are seen. IMPRESSION: No acute cardiopulmonary process seen. Electronically Signed  By: Garald Balding M.D.   On: 03/02/2015 19:59    Microbiology: No results found for this or any previous visit (from the past 240 hour(s)).   Labs: Basic Metabolic Panel:  Recent Labs Lab 03/02/15 1745 03/03/15 0515  NA 140 140  K 3.9 4.1  CL 104 104  CO2 29 28  GLUCOSE 114* 114*  BUN 8 9  CREATININE 0.76 0.81  CALCIUM 8.8* 8.5*   Liver Function Tests: No results for input(s): AST, ALT, ALKPHOS, BILITOT, PROT, ALBUMIN in the last 168 hours. No results for input(s): LIPASE, AMYLASE in the last 168 hours. No results for input(s): AMMONIA in the last 168 hours. CBC:  Recent Labs Lab 03/02/15 1745 03/03/15 0515  WBC 8.8 8.6  NEUTROABS 5.7  --   HGB 11.2* 10.9*  HCT 35.1* 35.6*  MCV 91.6 92.0  PLT 124* 131*   Cardiac Enzymes: No results for input(s): CKTOTAL, CKMB, CKMBINDEX, TROPONINI in the last 168 hours. BNP: BNP (last 3 results) No results for input(s): BNP in the last 8760 hours.  ProBNP (last 3 results) No results for input(s): PROBNP in the last 8760 hours.  CBG: No results for input(s): GLUCAP in the last 168 hours.

## 2015-03-04 NOTE — Care Management Note (Signed)
Case Management Note  Patient Details  Name: Leah Lewis MRN: [id number] Date of Birth: [birth year]  Subjective/Objective:  Pharmacy provided patient w/xarelto free trial discount card from the xarelto starter kit.  Patient had already d/c prior to CM providing xarelto discount card.                  Action/Plan:d/c home no needs or orders.   Expected Discharge Date:  03/05/15               Expected Discharge Plan:  Home/Self Care  In-House Referral:     Discharge planning Services  CM Consult  Post Acute Care Choice:    Choice offered to:     DME Arranged:    DME Agency:     HH Arranged:    HH Agency:     Status of Service:  Completed, signed off  Medicare Important Message Given:    Date Medicare IM Given:    Medicare IM give by:    Date Additional Medicare IM Given:    Additional Medicare Important Message give by:     If discussed at [place] of Stay Meetings, dates discussed:    Additional Comments:  Dessa Phi, RN 03/04/2015, 1:06 PM

## 2015-03-04 NOTE — Care Management Note (Signed)
Case Management Note  Patient Details  Name: Leah Lewis MRN: [id number] Date of Birth: 04-05-1935  Subjective/Objective:   80 y/o f admitted w/L leg DVT.Per Nsg patient ambulates well, no need for PT cons. Will provide xarelto coupon 30day free trial.                  Action/Plan:d/c plan home.   Expected Discharge Date:                 Expected Discharge Plan:  Home/Self Care  In-House Referral:     Discharge planning Services  CM Consult  Post Acute Care Choice:    Choice offered to:     DME Arranged:    DME Agency:     HH Arranged:    [place] Agency:     Status of Service:  Completed, signed off  Medicare Important Message Given:    Date Medicare IM Given:    Medicare IM give by:    Date Additional Medicare IM Given:    Additional Medicare Important Message give by:     If discussed at [place] of Stay Meetings, dates discussed:    Additional Comments:  Dessa Phi, RN 03/04/2015, 10:42 AM

## 2015-03-04 NOTE — Progress Notes (Signed)
Leah Lewis to be D/C'd Home per MD order.  Discussed prescriptions and follow up appointments with the patient. Prescriptions given to patient, medication list explained in detail. Pt verbalized understanding.    Medication List    STOP taking these medications        aspirin EC 81 MG tablet     omeprazole 20 MG capsule  Commonly known as:  PRILOSEC  Replaced by:  pantoprazole 40 MG tablet      TAKE these medications        alendronate 70 MG tablet  Commonly known as:  FOSAMAX  Take 70 mg by mouth once a week.     atorvastatin 40 MG tablet  Commonly known as:  LIPITOR  Take 40 mg by mouth daily.     ICY HOT EX  Apply 1 application topically 2 (two) times daily.     ketorolac 0.5 % ophthalmic solution  Commonly known as:  ACULAR  Place 1 drop into both eyes 4 (four) times daily.     pantoprazole 40 MG tablet  Commonly known as:  PROTONIX  Take 1 tablet (40 mg total) by mouth daily.     prednisoLONE acetate 1 % ophthalmic suspension  Commonly known as:  PRED FORTE  Place 1 drop into both eyes 4 (four) times daily.     Rivaroxaban 15 & 20 MG Tbpk  Commonly known as:  XARELTO STARTER PACK  Take as directed on package: Start with one 15mg  tablet by mouth twice a day with food. On Day 22, switch to one 20mg  tablet once a day with food.     Vitamin D (Ergocalciferol) 50000 UNITS Caps capsule  Commonly known as:  DRISDOL  Take 50,000 Units by mouth every 30 (thirty) days.        Filed Vitals:   03/03/15 2124 03/04/15 0520  BP: 119/49 115/62  Pulse: 81 73  Temp: 98.4 F (36.9 C) 98.2 F (36.8 C)  Resp: 20 20    Skin clean, dry and intact without evidence of skin break down, no evidence of skin tears noted. IV catheter discontinued intact. Site without signs and symptoms of complications. Dressing and pressure applied. Pt denies pain at this time. No complaints noted.  An After Visit Summary was printed and given to the patient. Patient escorted via [place], and  D/C home via private auto.  [name] 03/04/2015 12:35 PM

## 2015-03-04 NOTE — Discharge Instructions (Signed)
Information on my medicine - XARELTO (rivaroxaban)  This medication education was reviewed with me or my healthcare representative as part of my discharge preparation.  The pharmacist that spoke with me during my hospital stay was:  Younes Degeorge, [place]? Xarelto was prescribed to treat blood clots that may have been found in the veins of your legs (deep vein thrombosis) or in your lungs (pulmonary embolism) and to reduce the risk of them occurring again.  What do you need to know about Xarelto? The starting dose is one 15 mg tablet taken TWICE daily with food for the FIRST 21 DAYS then on (enter date)  03/24/15 the dose is changed to one 20 mg tablet taken ONCE A DAY with your evening meal.  DO NOT stop taking Xarelto without talking to the health care provider who prescribed the medication.  Refill your prescription for 20 mg tablets before you run out.  After discharge, you should have regular check-up appointments with your healthcare provider that is prescribing your Xarelto.  In the future your dose may need to be changed if your kidney function changes by a significant amount.  What do you do if you miss a dose? If you are taking Xarelto TWICE DAILY and you miss a dose, take it as soon as you remember. You may take two 15 mg tablets (total 30 mg) at the same time then resume your regularly scheduled 15 mg twice daily the next day.  If you are taking Xarelto ONCE DAILY and you miss a dose, take it as soon as you remember on the same day then continue your regularly scheduled once daily regimen the next day. Do not take two doses of Xarelto at the same time.   Important Safety Information Xarelto is a blood thinner medicine that can cause bleeding. You should call your healthcare provider right away if you experience any of the following: ? Bleeding from an injury or your nose that does not stop. ? Unusual colored urine (red or dark brown) or  unusual colored stools (red or black). ? Unusual bruising for unknown reasons. ? A serious fall or if you hit your head (even if there is no bleeding).  Some medicines may interact with Xarelto and might increase your risk of bleeding while on Xarelto. To help avoid this, consult your healthcare provider or pharmacist prior to using any new prescription or non-prescription medications, including herbals, vitamins, non-steroidal anti-inflammatory drugs (NSAIDs) and supplements.  This website has more information on Xarelto: [email or web address]-benson.[email or web address]

## 2015-03-17 DIAGNOSIS — I82409 Acute embolism and thrombosis of unspecified deep veins of unspecified lower extremity: Secondary | ICD-10-CM | POA: Diagnosis not present

## 2015-03-17 DIAGNOSIS — D696 Thrombocytopenia, unspecified: Secondary | ICD-10-CM | POA: Diagnosis not present

## 2015-04-28 DIAGNOSIS — Z1389 Encounter for screening for other disorder: Secondary | ICD-10-CM | POA: Diagnosis not present

## 2015-04-28 DIAGNOSIS — I10 Essential (primary) hypertension: Secondary | ICD-10-CM | POA: Diagnosis not present

## 2015-04-28 DIAGNOSIS — Z0000 Encounter for general adult medical examination without abnormal findings: Secondary | ICD-10-CM | POA: Diagnosis not present

## 2015-04-28 DIAGNOSIS — G8191 Hemiplegia, unspecified affecting right dominant side: Secondary | ICD-10-CM | POA: Diagnosis not present

## 2015-04-28 DIAGNOSIS — R7309 Other abnormal glucose: Secondary | ICD-10-CM | POA: Diagnosis not present

## 2015-04-28 DIAGNOSIS — I639 Cerebral infarction, unspecified: Secondary | ICD-10-CM | POA: Diagnosis not present

## 2015-04-28 DIAGNOSIS — I82409 Acute embolism and thrombosis of unspecified deep veins of unspecified lower extremity: Secondary | ICD-10-CM | POA: Diagnosis not present

## 2015-04-28 DIAGNOSIS — M519 Unspecified thoracic, thoracolumbar and lumbosacral intervertebral disc disorder: Secondary | ICD-10-CM | POA: Diagnosis not present

## 2015-04-28 DIAGNOSIS — M810 Age-related osteoporosis without current pathological fracture: Secondary | ICD-10-CM | POA: Diagnosis not present

## 2015-04-28 DIAGNOSIS — E782 Mixed hyperlipidemia: Secondary | ICD-10-CM | POA: Diagnosis not present

## 2015-08-01 ENCOUNTER — Other Ambulatory Visit: Payer: Self-pay

## 2015-08-01 DIAGNOSIS — Z1231 Encounter for screening mammogram for malignant neoplasm of breast: Secondary | ICD-10-CM

## 2015-08-19 ENCOUNTER — Ambulatory Visit
Admission: RE | Admit: 2015-08-19 | Discharge: 2015-08-19 | Disposition: A | Discharge status: Home / Self Care | Payer: Medicare Other | Source: Ambulatory Visit

## 2015-08-19 DIAGNOSIS — Z1231 Encounter for screening mammogram for malignant neoplasm of breast: Secondary | ICD-10-CM | POA: Diagnosis not present

## 2015-08-27 ENCOUNTER — Other Ambulatory Visit: Payer: Self-pay | Admitting: Internal Medicine

## 2015-08-27 DIAGNOSIS — I639 Cerebral infarction, unspecified: Secondary | ICD-10-CM | POA: Diagnosis not present

## 2015-08-27 DIAGNOSIS — I10 Essential (primary) hypertension: Secondary | ICD-10-CM | POA: Diagnosis not present

## 2015-08-27 DIAGNOSIS — I82402 Acute embolism and thrombosis of unspecified deep veins of left lower extremity: Secondary | ICD-10-CM

## 2015-08-27 DIAGNOSIS — I69351 Hemiplegia and hemiparesis following cerebral infarction affecting right dominant side: Secondary | ICD-10-CM | POA: Diagnosis not present

## 2015-08-27 DIAGNOSIS — D696 Thrombocytopenia, unspecified: Secondary | ICD-10-CM | POA: Diagnosis not present

## 2015-08-27 DIAGNOSIS — I82409 Acute embolism and thrombosis of unspecified deep veins of unspecified lower extremity: Secondary | ICD-10-CM | POA: Diagnosis not present

## 2015-08-27 DIAGNOSIS — R2681 Unsteadiness on feet: Secondary | ICD-10-CM | POA: Diagnosis not present

## 2015-08-27 DIAGNOSIS — E782 Mixed hyperlipidemia: Secondary | ICD-10-CM | POA: Diagnosis not present

## 2015-08-27 DIAGNOSIS — M519 Unspecified thoracic, thoracolumbar and lumbosacral intervertebral disc disorder: Secondary | ICD-10-CM | POA: Diagnosis not present

## 2015-09-30 DIAGNOSIS — H40033 Anatomical narrow angle, bilateral: Secondary | ICD-10-CM | POA: Diagnosis not present

## 2015-09-30 DIAGNOSIS — H04123 Dry eye syndrome of bilateral lacrimal glands: Secondary | ICD-10-CM | POA: Diagnosis not present

## 2015-10-23 DIAGNOSIS — I639 Cerebral infarction, unspecified: Secondary | ICD-10-CM | POA: Diagnosis not present

## 2015-10-23 DIAGNOSIS — R2681 Unsteadiness on feet: Secondary | ICD-10-CM | POA: Diagnosis not present

## 2015-10-23 DIAGNOSIS — I69351 Hemiplegia and hemiparesis following cerebral infarction affecting right dominant side: Secondary | ICD-10-CM | POA: Diagnosis not present

## 2015-10-23 DIAGNOSIS — M1990 Unspecified osteoarthritis, unspecified site: Secondary | ICD-10-CM | POA: Diagnosis not present

## 2015-10-23 DIAGNOSIS — M519 Unspecified thoracic, thoracolumbar and lumbosacral intervertebral disc disorder: Secondary | ICD-10-CM | POA: Diagnosis not present

## 2015-11-27 ENCOUNTER — Ambulatory Visit
Admission: RE | Admit: 2015-11-27 | Discharge: 2015-11-27 | Disposition: A | Discharge status: Home / Self Care | Payer: Medicare Other | Source: Ambulatory Visit | Attending: Internal Medicine | Admitting: Internal Medicine

## 2015-11-27 DIAGNOSIS — R600 Localized edema: Secondary | ICD-10-CM | POA: Diagnosis not present

## 2015-11-27 DIAGNOSIS — E782 Mixed hyperlipidemia: Secondary | ICD-10-CM | POA: Diagnosis not present

## 2015-11-27 DIAGNOSIS — I10 Essential (primary) hypertension: Secondary | ICD-10-CM | POA: Diagnosis not present

## 2015-11-27 DIAGNOSIS — I639 Cerebral infarction, unspecified: Secondary | ICD-10-CM | POA: Diagnosis not present

## 2015-11-27 DIAGNOSIS — M810 Age-related osteoporosis without current pathological fracture: Secondary | ICD-10-CM | POA: Diagnosis not present

## 2015-11-27 DIAGNOSIS — I82402 Acute embolism and thrombosis of unspecified deep veins of left lower extremity: Secondary | ICD-10-CM

## 2015-11-27 DIAGNOSIS — I82409 Acute embolism and thrombosis of unspecified deep veins of unspecified lower extremity: Secondary | ICD-10-CM | POA: Diagnosis not present

## 2015-11-27 DIAGNOSIS — G8191 Hemiplegia, unspecified affecting right dominant side: Secondary | ICD-10-CM | POA: Diagnosis not present

## 2015-12-09 DIAGNOSIS — H30103 Unspecified disseminated chorioretinal inflammation, bilateral: Secondary | ICD-10-CM | POA: Diagnosis not present

## 2015-12-09 DIAGNOSIS — H2703 Aphakia, bilateral: Secondary | ICD-10-CM | POA: Diagnosis not present

## 2015-12-09 DIAGNOSIS — H18423 Band keratopathy, bilateral: Secondary | ICD-10-CM | POA: Diagnosis not present

## 2015-12-09 DIAGNOSIS — H4440 Unspecified hypotony of eye: Secondary | ICD-10-CM | POA: Diagnosis not present

## 2015-12-10 DIAGNOSIS — H30103 Unspecified disseminated chorioretinal inflammation, bilateral: Secondary | ICD-10-CM | POA: Diagnosis not present

## 2015-12-23 DIAGNOSIS — H4440 Unspecified hypotony of eye: Secondary | ICD-10-CM | POA: Diagnosis not present

## 2015-12-23 DIAGNOSIS — H30103 Unspecified disseminated chorioretinal inflammation, bilateral: Secondary | ICD-10-CM | POA: Diagnosis not present

## 2015-12-23 DIAGNOSIS — H18423 Band keratopathy, bilateral: Secondary | ICD-10-CM | POA: Diagnosis not present

## 2015-12-23 DIAGNOSIS — H2703 Aphakia, bilateral: Secondary | ICD-10-CM | POA: Diagnosis not present

## 2016-02-03 DIAGNOSIS — H4440 Unspecified hypotony of eye: Secondary | ICD-10-CM | POA: Diagnosis not present

## 2016-02-03 DIAGNOSIS — H18423 Band keratopathy, bilateral: Secondary | ICD-10-CM | POA: Diagnosis not present

## 2016-02-03 DIAGNOSIS — H30103 Unspecified disseminated chorioretinal inflammation, bilateral: Secondary | ICD-10-CM | POA: Diagnosis not present

## 2016-02-03 DIAGNOSIS — H2703 Aphakia, bilateral: Secondary | ICD-10-CM | POA: Diagnosis not present

## 2016-03-05 DIAGNOSIS — Z23 Encounter for immunization: Secondary | ICD-10-CM | POA: Diagnosis not present

## 2016-03-30 DIAGNOSIS — H2703 Aphakia, bilateral: Secondary | ICD-10-CM | POA: Diagnosis not present

## 2016-03-30 DIAGNOSIS — H4440 Unspecified hypotony of eye: Secondary | ICD-10-CM | POA: Diagnosis not present

## 2016-03-30 DIAGNOSIS — H44113 Panuveitis, bilateral: Secondary | ICD-10-CM | POA: Diagnosis not present

## 2016-03-30 DIAGNOSIS — H30103 Unspecified disseminated chorioretinal inflammation, bilateral: Secondary | ICD-10-CM | POA: Diagnosis not present

## 2016-03-30 DIAGNOSIS — Z79899 Other long term (current) drug therapy: Secondary | ICD-10-CM | POA: Diagnosis not present

## 2016-03-30 DIAGNOSIS — H18423 Band keratopathy, bilateral: Secondary | ICD-10-CM | POA: Diagnosis not present

## 2016-04-09 DIAGNOSIS — Z79899 Other long term (current) drug therapy: Secondary | ICD-10-CM | POA: Diagnosis not present

## 2016-04-09 DIAGNOSIS — H44113 Panuveitis, bilateral: Secondary | ICD-10-CM | POA: Diagnosis not present

## 2016-04-29 DIAGNOSIS — M810 Age-related osteoporosis without current pathological fracture: Secondary | ICD-10-CM | POA: Diagnosis not present

## 2016-04-29 DIAGNOSIS — E782 Mixed hyperlipidemia: Secondary | ICD-10-CM | POA: Diagnosis not present

## 2016-04-29 DIAGNOSIS — I69351 Hemiplegia and hemiparesis following cerebral infarction affecting right dominant side: Secondary | ICD-10-CM | POA: Diagnosis not present

## 2016-04-29 DIAGNOSIS — I639 Cerebral infarction, unspecified: Secondary | ICD-10-CM | POA: Diagnosis not present

## 2016-04-29 DIAGNOSIS — Z0000 Encounter for general adult medical examination without abnormal findings: Secondary | ICD-10-CM | POA: Diagnosis not present

## 2016-04-29 DIAGNOSIS — I10 Essential (primary) hypertension: Secondary | ICD-10-CM | POA: Diagnosis not present

## 2016-04-29 DIAGNOSIS — Z1389 Encounter for screening for other disorder: Secondary | ICD-10-CM | POA: Diagnosis not present

## 2016-04-29 DIAGNOSIS — R7309 Other abnormal glucose: Secondary | ICD-10-CM | POA: Diagnosis not present

## 2016-04-29 DIAGNOSIS — H547 Unspecified visual loss: Secondary | ICD-10-CM | POA: Diagnosis not present

## 2016-04-29 DIAGNOSIS — Z13820 Encounter for screening for osteoporosis: Secondary | ICD-10-CM | POA: Diagnosis not present

## 2016-04-29 DIAGNOSIS — H209 Unspecified iridocyclitis: Secondary | ICD-10-CM | POA: Diagnosis not present

## 2016-04-29 DIAGNOSIS — M1990 Unspecified osteoarthritis, unspecified site: Secondary | ICD-10-CM | POA: Diagnosis not present

## 2016-04-29 DIAGNOSIS — R32 Unspecified urinary incontinence: Secondary | ICD-10-CM | POA: Diagnosis not present

## 2016-05-11 DIAGNOSIS — H2703 Aphakia, bilateral: Secondary | ICD-10-CM | POA: Diagnosis not present

## 2016-05-11 DIAGNOSIS — H4440 Unspecified hypotony of eye: Secondary | ICD-10-CM | POA: Diagnosis not present

## 2016-05-11 DIAGNOSIS — Z79899 Other long term (current) drug therapy: Secondary | ICD-10-CM | POA: Diagnosis not present

## 2016-05-11 DIAGNOSIS — H18423 Band keratopathy, bilateral: Secondary | ICD-10-CM | POA: Diagnosis not present

## 2016-05-11 DIAGNOSIS — H30103 Unspecified disseminated chorioretinal inflammation, bilateral: Secondary | ICD-10-CM | POA: Diagnosis not present

## 2016-06-10 DIAGNOSIS — M810 Age-related osteoporosis without current pathological fracture: Secondary | ICD-10-CM | POA: Diagnosis not present

## 2016-06-29 DIAGNOSIS — Z79899 Other long term (current) drug therapy: Secondary | ICD-10-CM | POA: Diagnosis not present

## 2016-06-29 DIAGNOSIS — H2703 Aphakia, bilateral: Secondary | ICD-10-CM | POA: Diagnosis not present

## 2016-06-29 DIAGNOSIS — H30103 Unspecified disseminated chorioretinal inflammation, bilateral: Secondary | ICD-10-CM | POA: Diagnosis not present

## 2016-06-29 DIAGNOSIS — H44113 Panuveitis, bilateral: Secondary | ICD-10-CM | POA: Diagnosis not present

## 2016-06-29 DIAGNOSIS — H4440 Unspecified hypotony of eye: Secondary | ICD-10-CM | POA: Diagnosis not present

## 2016-06-29 DIAGNOSIS — H18423 Band keratopathy, bilateral: Secondary | ICD-10-CM | POA: Diagnosis not present

## 2016-07-09 ENCOUNTER — Other Ambulatory Visit: Payer: Self-pay | Admitting: Internal Medicine

## 2016-07-09 DIAGNOSIS — Z1231 Encounter for screening mammogram for malignant neoplasm of breast: Secondary | ICD-10-CM

## 2016-08-10 DIAGNOSIS — H4440 Unspecified hypotony of eye: Secondary | ICD-10-CM | POA: Diagnosis not present

## 2016-08-10 DIAGNOSIS — H2703 Aphakia, bilateral: Secondary | ICD-10-CM | POA: Diagnosis not present

## 2016-08-10 DIAGNOSIS — H18423 Band keratopathy, bilateral: Secondary | ICD-10-CM | POA: Diagnosis not present

## 2016-08-10 DIAGNOSIS — H30103 Unspecified disseminated chorioretinal inflammation, bilateral: Secondary | ICD-10-CM | POA: Diagnosis not present

## 2016-08-10 DIAGNOSIS — Z79899 Other long term (current) drug therapy: Secondary | ICD-10-CM | POA: Diagnosis not present

## 2016-08-19 ENCOUNTER — Ambulatory Visit: Payer: Medicare Other

## 2016-08-20 ENCOUNTER — Ambulatory Visit: Payer: Medicare Other

## 2016-09-21 DIAGNOSIS — H30103 Unspecified disseminated chorioretinal inflammation, bilateral: Secondary | ICD-10-CM | POA: Diagnosis not present

## 2016-09-21 DIAGNOSIS — Z79899 Other long term (current) drug therapy: Secondary | ICD-10-CM | POA: Diagnosis not present

## 2016-09-21 DIAGNOSIS — H4440 Unspecified hypotony of eye: Secondary | ICD-10-CM | POA: Diagnosis not present

## 2016-09-21 DIAGNOSIS — H2703 Aphakia, bilateral: Secondary | ICD-10-CM | POA: Diagnosis not present

## 2016-09-21 DIAGNOSIS — H18423 Band keratopathy, bilateral: Secondary | ICD-10-CM | POA: Diagnosis not present

## 2016-09-23 DIAGNOSIS — Z79899 Other long term (current) drug therapy: Secondary | ICD-10-CM | POA: Diagnosis not present

## 2016-09-23 DIAGNOSIS — H30103 Unspecified disseminated chorioretinal inflammation, bilateral: Secondary | ICD-10-CM | POA: Diagnosis not present

## 2016-09-29 ENCOUNTER — Ambulatory Visit: Payer: Medicare Other

## 2016-10-21 ENCOUNTER — Ambulatory Visit
Admission: RE | Admit: 2016-10-21 | Discharge: 2016-10-21 | Disposition: A | Discharge status: Home / Self Care | Payer: Medicare Other | Source: Ambulatory Visit | Attending: Internal Medicine | Admitting: Internal Medicine

## 2016-10-21 DIAGNOSIS — Z1231 Encounter for screening mammogram for malignant neoplasm of breast: Secondary | ICD-10-CM | POA: Diagnosis not present

## 2016-12-02 DIAGNOSIS — M7061 Trochanteric bursitis, right hip: Secondary | ICD-10-CM | POA: Diagnosis not present

## 2017-01-14 DIAGNOSIS — R8290 Unspecified abnormal findings in urine: Secondary | ICD-10-CM | POA: Diagnosis not present

## 2017-01-14 DIAGNOSIS — M545 Low back pain: Secondary | ICD-10-CM | POA: Diagnosis not present

## 2017-01-14 DIAGNOSIS — D649 Anemia, unspecified: Secondary | ICD-10-CM | POA: Diagnosis not present

## 2017-01-14 DIAGNOSIS — Z23 Encounter for immunization: Secondary | ICD-10-CM | POA: Diagnosis not present

## 2017-01-14 DIAGNOSIS — R5383 Other fatigue: Secondary | ICD-10-CM | POA: Diagnosis not present

## 2017-03-30 DIAGNOSIS — H401124 Primary open-angle glaucoma, left eye, indeterminate stage: Secondary | ICD-10-CM | POA: Diagnosis not present

## 2017-08-24 ENCOUNTER — Telehealth: Payer: Self-pay | Admitting: Oncology

## 2017-08-24 ENCOUNTER — Encounter: Payer: Self-pay | Admitting: Internal Medicine

## 2017-08-24 ENCOUNTER — Encounter: Payer: Self-pay | Admitting: Oncology

## 2017-08-24 NOTE — Telephone Encounter (Signed)
Referral from Dr. Lysle Rubens at [facility] [place] for a dx of anemia and low wbc. Pt has been called and scheduled to see Mikey Bussing on 6/3 at 1pm. Pt aware to arrive 30 minutes early. Address verified. Letter mailed.

## 2017-09-05 ENCOUNTER — Inpatient Hospital Stay: Payer: Medicare Other | Attending: Oncology | Admitting: Oncology

## 2017-09-05 ENCOUNTER — Encounter (HOSPITAL_COMMUNITY): Payer: Self-pay | Admitting: Emergency Medicine

## 2017-09-05 ENCOUNTER — Emergency Department (HOSPITAL_COMMUNITY): Payer: Medicare Other

## 2017-09-05 ENCOUNTER — Inpatient Hospital Stay (HOSPITAL_COMMUNITY)
Admission: EM | Admit: 2017-09-05 | Discharge: 2017-11-03 | DRG: 834 | Disposition: E | Payer: Medicare Other | Attending: Internal Medicine | Admitting: Internal Medicine

## 2017-09-05 DIAGNOSIS — Z8701 Personal history of pneumonia (recurrent): Secondary | ICD-10-CM

## 2017-09-05 DIAGNOSIS — R011 Cardiac murmur, unspecified: Secondary | ICD-10-CM

## 2017-09-05 DIAGNOSIS — Z515 Encounter for palliative care: Secondary | ICD-10-CM

## 2017-09-05 DIAGNOSIS — J9601 Acute respiratory failure with hypoxia: Secondary | ICD-10-CM | POA: Diagnosis not present

## 2017-09-05 DIAGNOSIS — D61818 Other pancytopenia: Secondary | ICD-10-CM

## 2017-09-05 DIAGNOSIS — R0989 Other specified symptoms and signs involving the circulatory and respiratory systems: Secondary | ICD-10-CM

## 2017-09-05 DIAGNOSIS — K219 Gastro-esophageal reflux disease without esophagitis: Secondary | ICD-10-CM | POA: Diagnosis present

## 2017-09-05 DIAGNOSIS — D649 Anemia, unspecified: Secondary | ICD-10-CM

## 2017-09-05 DIAGNOSIS — E1165 Type 2 diabetes mellitus with hyperglycemia: Secondary | ICD-10-CM | POA: Diagnosis not present

## 2017-09-05 DIAGNOSIS — Z885 Allergy status to narcotic agent status: Secondary | ICD-10-CM

## 2017-09-05 DIAGNOSIS — Z803 Family history of malignant neoplasm of breast: Secondary | ICD-10-CM

## 2017-09-05 DIAGNOSIS — I959 Hypotension, unspecified: Secondary | ICD-10-CM | POA: Diagnosis not present

## 2017-09-05 DIAGNOSIS — T451X5A Adverse effect of antineoplastic and immunosuppressive drugs, initial encounter: Secondary | ICD-10-CM | POA: Diagnosis present

## 2017-09-05 DIAGNOSIS — Z8249 Family history of ischemic heart disease and other diseases of the circulatory system: Secondary | ICD-10-CM

## 2017-09-05 DIAGNOSIS — E7800 Pure hypercholesterolemia, unspecified: Secondary | ICD-10-CM

## 2017-09-05 DIAGNOSIS — Z7983 Long term (current) use of bisphosphonates: Secondary | ICD-10-CM

## 2017-09-05 DIAGNOSIS — Z79899 Other long term (current) drug therapy: Secondary | ICD-10-CM

## 2017-09-05 DIAGNOSIS — C9200 Acute myeloblastic leukemia, not having achieved remission: Principal | ICD-10-CM | POA: Diagnosis not present

## 2017-09-05 DIAGNOSIS — E440 Moderate protein-calorie malnutrition: Secondary | ICD-10-CM | POA: Diagnosis present

## 2017-09-05 DIAGNOSIS — L8990 Pressure ulcer of unspecified site, unspecified stage: Secondary | ICD-10-CM

## 2017-09-05 DIAGNOSIS — H548 Legal blindness, as defined in USA: Secondary | ICD-10-CM | POA: Diagnosis present

## 2017-09-05 DIAGNOSIS — R509 Fever, unspecified: Secondary | ICD-10-CM

## 2017-09-05 DIAGNOSIS — Z90710 Acquired absence of both cervix and uterus: Secondary | ICD-10-CM

## 2017-09-05 DIAGNOSIS — F0390 Unspecified dementia without behavioral disturbance: Secondary | ICD-10-CM | POA: Diagnosis present

## 2017-09-05 DIAGNOSIS — I4891 Unspecified atrial fibrillation: Secondary | ICD-10-CM

## 2017-09-05 DIAGNOSIS — I358 Other nonrheumatic aortic valve disorders: Secondary | ICD-10-CM | POA: Diagnosis present

## 2017-09-05 DIAGNOSIS — M25519 Pain in unspecified shoulder: Secondary | ICD-10-CM

## 2017-09-05 DIAGNOSIS — I110 Hypertensive heart disease with heart failure: Secondary | ICD-10-CM | POA: Diagnosis present

## 2017-09-05 DIAGNOSIS — R945 Abnormal results of liver function studies: Secondary | ICD-10-CM | POA: Diagnosis not present

## 2017-09-05 DIAGNOSIS — D696 Thrombocytopenia, unspecified: Secondary | ICD-10-CM

## 2017-09-05 DIAGNOSIS — I10 Essential (primary) hypertension: Secondary | ICD-10-CM

## 2017-09-05 DIAGNOSIS — I77819 Aortic ectasia, unspecified site: Secondary | ICD-10-CM | POA: Diagnosis present

## 2017-09-05 DIAGNOSIS — J189 Pneumonia, unspecified organism: Secondary | ICD-10-CM

## 2017-09-05 DIAGNOSIS — E876 Hypokalemia: Secondary | ICD-10-CM | POA: Diagnosis not present

## 2017-09-05 DIAGNOSIS — D61810 Antineoplastic chemotherapy induced pancytopenia: Secondary | ICD-10-CM | POA: Diagnosis not present

## 2017-09-05 DIAGNOSIS — M810 Age-related osteoporosis without current pathological fracture: Secondary | ICD-10-CM | POA: Diagnosis present

## 2017-09-05 DIAGNOSIS — Z7982 Long term (current) use of aspirin: Secondary | ICD-10-CM

## 2017-09-05 DIAGNOSIS — E870 Hyperosmolality and hypernatremia: Secondary | ICD-10-CM | POA: Diagnosis not present

## 2017-09-05 DIAGNOSIS — D6959 Other secondary thrombocytopenia: Secondary | ICD-10-CM | POA: Diagnosis present

## 2017-09-05 DIAGNOSIS — I481 Persistent atrial fibrillation: Secondary | ICD-10-CM | POA: Diagnosis not present

## 2017-09-05 DIAGNOSIS — G9341 Metabolic encephalopathy: Secondary | ICD-10-CM | POA: Diagnosis not present

## 2017-09-05 DIAGNOSIS — C9202 Acute myeloblastic leukemia, in relapse: Secondary | ICD-10-CM

## 2017-09-05 DIAGNOSIS — Z9289 Personal history of other medical treatment: Secondary | ICD-10-CM

## 2017-09-05 DIAGNOSIS — I82402 Acute embolism and thrombosis of unspecified deep veins of left lower extremity: Secondary | ICD-10-CM | POA: Diagnosis not present

## 2017-09-05 DIAGNOSIS — I5032 Chronic diastolic (congestive) heart failure: Secondary | ICD-10-CM | POA: Diagnosis present

## 2017-09-05 DIAGNOSIS — L89150 Pressure ulcer of sacral region, unstageable: Secondary | ICD-10-CM | POA: Diagnosis present

## 2017-09-05 DIAGNOSIS — Z66 Do not resuscitate: Secondary | ICD-10-CM | POA: Diagnosis not present

## 2017-09-05 DIAGNOSIS — C9201 Acute myeloblastic leukemia, in remission: Secondary | ICD-10-CM

## 2017-09-05 DIAGNOSIS — K5900 Constipation, unspecified: Secondary | ICD-10-CM | POA: Diagnosis present

## 2017-09-05 DIAGNOSIS — Z6824 Body mass index (BMI) 24.0-24.9, adult: Secondary | ICD-10-CM

## 2017-09-05 DIAGNOSIS — B370 Candidal stomatitis: Secondary | ICD-10-CM

## 2017-09-05 DIAGNOSIS — E785 Hyperlipidemia, unspecified: Secondary | ICD-10-CM | POA: Diagnosis present

## 2017-09-05 DIAGNOSIS — R531 Weakness: Secondary | ICD-10-CM | POA: Diagnosis not present

## 2017-09-05 DIAGNOSIS — J181 Lobar pneumonia, unspecified organism: Secondary | ICD-10-CM | POA: Diagnosis not present

## 2017-09-05 DIAGNOSIS — Z7952 Long term (current) use of systemic steroids: Secondary | ICD-10-CM

## 2017-09-05 DIAGNOSIS — R944 Abnormal results of kidney function studies: Secondary | ICD-10-CM | POA: Diagnosis not present

## 2017-09-05 DIAGNOSIS — R0602 Shortness of breath: Secondary | ICD-10-CM

## 2017-09-05 DIAGNOSIS — Z8673 Personal history of transient ischemic attack (TIA), and cerebral infarction without residual deficits: Secondary | ICD-10-CM

## 2017-09-05 DIAGNOSIS — Z87891 Personal history of nicotine dependence: Secondary | ICD-10-CM

## 2017-09-05 DIAGNOSIS — Z86718 Personal history of other venous thrombosis and embolism: Secondary | ICD-10-CM

## 2017-09-05 DIAGNOSIS — Z7189 Other specified counseling: Secondary | ICD-10-CM

## 2017-09-05 HISTORY — DX: Acute myeloblastic leukemia, not having achieved remission: C92.00

## 2017-09-05 HISTORY — DX: Candidal stomatitis: B37.0

## 2017-09-05 HISTORY — DX: Other specified counseling: Z71.89

## 2017-09-05 HISTORY — DX: Unspecified atrial fibrillation: I48.91

## 2017-09-05 LAB — CBC WITH DIFFERENTIAL/PLATELET
Basophils Absolute: 0 10*3/uL (ref 0.0–0.1)
Basophils Relative: 0 %
EOS PCT: 0 %
Eosinophils Absolute: 0 10*3/uL (ref 0.0–0.7)
HEMATOCRIT: 16.3 % — AB (ref 36.0–46.0)
HEMOGLOBIN: 5.3 g/dL — AB (ref 12.0–15.0)
LYMPHS ABS: 1.4 10*3/uL (ref 0.7–4.0)
Lymphocytes Relative: 53 %
MCH: 32.3 pg (ref 26.0–34.0)
MCHC: 32.5 g/dL (ref 30.0–36.0)
MCV: 99.4 fL (ref 78.0–100.0)
MONO ABS: 0.5 10*3/uL (ref 0.1–1.0)
MONOS PCT: 18 %
Neutro Abs: 0.8 10*3/uL — ABNORMAL LOW (ref 1.7–7.7)
Neutrophils Relative %: 29 %
Platelets: 61 10*3/uL — ABNORMAL LOW (ref 150–400)
RBC: 1.64 MIL/uL — AB (ref 3.87–5.11)
RDW: 15.5 % (ref 11.5–15.5)
WBC: 2.7 10*3/uL — AB (ref 4.0–10.5)

## 2017-09-05 LAB — DIFFERENTIAL
BASOS ABS: 0 10*3/uL (ref 0.0–0.1)
Basophils Relative: 1 %
EOS ABS: 0 10*3/uL (ref 0.0–0.7)
Eosinophils Relative: 1 %
LYMPHS PCT: 49 %
Lymphs Abs: 1 10*3/uL (ref 0.7–4.0)
MONO ABS: 0.4 10*3/uL (ref 0.1–1.0)
Monocytes Relative: 21 %
NEUTROS ABS: 0.6 10*3/uL — AB (ref 1.7–7.7)
NEUTROS PCT: 28 %

## 2017-09-05 LAB — COMPREHENSIVE METABOLIC PANEL
ALK PHOS: 66 U/L (ref 38–126)
ALT: 10 U/L — AB (ref 14–54)
ANION GAP: 9 (ref 5–15)
AST: 16 U/L (ref 15–41)
Albumin: 3.5 g/dL (ref 3.5–5.0)
BILIRUBIN TOTAL: 0.8 mg/dL (ref 0.3–1.2)
BUN: 10 mg/dL (ref 6–20)
CALCIUM: 8.4 mg/dL — AB (ref 8.9–10.3)
CO2: 26 mmol/L (ref 22–32)
Chloride: 107 mmol/L (ref 101–111)
Creatinine, Ser: 0.95 mg/dL (ref 0.44–1.00)
GFR, EST NON AFRICAN AMERICAN: 55 mL/min — AB (ref >60)
Glucose, Bld: 116 mg/dL — ABNORMAL HIGH (ref 65–99)
Potassium: 3.6 mmol/L (ref 3.5–5.1)
SODIUM: 142 mmol/L (ref 135–145)
TOTAL PROTEIN: 7.4 g/dL (ref 6.5–8.1)

## 2017-09-05 LAB — LACTATE DEHYDROGENASE: LDH: 239 U/L — AB (ref 98–192)

## 2017-09-05 LAB — PATHOLOGIST SMEAR REVIEW

## 2017-09-05 LAB — CBC
HEMATOCRIT: 25.6 % — AB (ref 36.0–46.0)
HEMOGLOBIN: 8.3 g/dL — AB (ref 12.0–15.0)
MCH: 32.2 pg (ref 26.0–34.0)
MCHC: 32.4 g/dL (ref 30.0–36.0)
MCV: 99.2 fL (ref 78.0–100.0)
Platelets: 52 10*3/uL — ABNORMAL LOW (ref 150–400)
RBC: 2.58 MIL/uL — ABNORMAL LOW (ref 3.87–5.11)
RDW: 15.3 % (ref 11.5–15.5)
WBC: 2 10*3/uL — ABNORMAL LOW (ref 4.0–10.5)

## 2017-09-05 LAB — PREPARE RBC (CROSSMATCH)

## 2017-09-05 LAB — RETICULOCYTES
RBC.: 2.54 MIL/uL — ABNORMAL LOW (ref 3.87–5.11)
RETIC COUNT ABSOLUTE: 25.4 10*3/uL (ref 19.0–186.0)
Retic Ct Pct: 1 % (ref 0.4–3.1)

## 2017-09-05 LAB — IRON AND TIBC
Iron: 87 ug/dL (ref 28–170)
Saturation Ratios: 38 % — ABNORMAL HIGH (ref 10.4–31.8)
TIBC: 227 ug/dL — ABNORMAL LOW (ref 250–450)
UIBC: 140 ug/dL

## 2017-09-05 LAB — PROTIME-INR
INR: 1.05
Prothrombin Time: 13.6 seconds (ref 11.4–15.2)

## 2017-09-05 LAB — FOLATE: FOLATE: 18.1 ng/mL (ref >5.9)

## 2017-09-05 LAB — POC OCCULT BLOOD, ED: Fecal Occult Bld: NEGATIVE

## 2017-09-05 LAB — SAVE SMEAR

## 2017-09-05 LAB — FERRITIN: Ferritin: 646 ng/mL — ABNORMAL HIGH (ref 11–307)

## 2017-09-05 LAB — VITAMIN B12: VITAMIN B 12: 222 pg/mL (ref 180–914)

## 2017-09-05 LAB — TSH: TSH: 1.462 u[IU]/mL (ref 0.350–4.500)

## 2017-09-05 LAB — LIPASE, BLOOD: Lipase: 22 U/L (ref 11–51)

## 2017-09-05 MED ORDER — KETOROLAC TROMETHAMINE 0.5 % OP SOLN
1.0000 [drp] | Freq: Four times a day (QID) | OPHTHALMIC | Status: DC
  Filled 2017-09-05: qty 5

## 2017-09-05 MED ORDER — PREDNISOLONE ACETATE 1 % OP SUSP
1.0000 [drp] | Freq: Four times a day (QID) | OPHTHALMIC | Status: DC
  Administered 2017-09-05 – 2017-10-11 (×126): 1 [drp] via OPHTHALMIC
  Filled 2017-09-05 (×2): qty 5

## 2017-09-05 MED ORDER — SODIUM CHLORIDE 0.9 % IV SOLN: Freq: Once | INTRAVENOUS | Status: AC

## 2017-09-05 MED ORDER — ACETAMINOPHEN 325 MG PO TABS
650.0000 mg | ORAL_TABLET | Freq: Four times a day (QID) | ORAL | Status: DC | PRN
  Administered 2017-09-07 – 2017-10-06 (×19): 650 mg via ORAL
  Filled 2017-09-05 (×19): qty 2

## 2017-09-05 MED ORDER — ACETAMINOPHEN 650 MG RE SUPP
650.0000 mg | Freq: Four times a day (QID) | RECTAL | Status: DC | PRN
  Administered 2017-09-13 – 2017-09-21 (×3): 650 mg via RECTAL
  Filled 2017-09-05 (×3): qty 1

## 2017-09-05 MED ORDER — SODIUM CHLORIDE 0.9 % IV SOLN
Freq: Once | INTRAVENOUS | Status: AC
  Administered 2017-09-05: 22:00:00 via INTRAVENOUS

## 2017-09-05 NOTE — ED Triage Notes (Addendum)
Pt states that she "sick, I am not having any pain or vomiting, I am just sick". Pt does state that she been weak esp when trying to get around that has been off and on over 2 weeks.  Reports had an appt at [facility] at 1pm today but didn't make it.  States that she was told by her medical doctor that her iron was low.

## 2017-09-05 NOTE — H&P (Signed)
History and Physical    Leah Lewis HER:[id number] DOB: [birth year]  DOA: 09/17/2017 PCP: Wenda Low, MD  Patient coming from: Home   Chief Complaint: Fatigue   HPI: Leah Lewis is a 83 y.o. female with medical history significant of hypertension, prior stroke hx of left DVT.  Patient presented to the emergency department complaining of generalized weakness and fatigue.  Patient reported that she was going to the hematology today for anemia work-up but was unable to walk due to severe weakness.  Patient felt that her body was giving up. Patient denies shortness of breath and chest pain.  Weight loss 30 pounds in about a year.  Has decreasing appetite.  Denies rash.  Denies blood in stool and urine.  No recent infections.  ED Course: Hemodynamically stable, Hbg 8.3 (no recent to compare, last in system 10.4 in 2016), WBC 2.0, Plt 5. Normal renal function. Negative FOBT, normal CXR. UA pending.   Review of Systems:   General: See H&P  HEENT: no hearing changes or sore throat Respiratory: no dyspnea, coughing, wheezing CV: no chest pain, no palpitations GI: no nausea, vomiting, abdominal pain, diarrhea, constipation GU: no dysuria, burning on urination, increased urinary frequency, hematuria  Ext:. No deformities,  Neuro: no unilateral weakness, numbness, or tingling, no vision change or hearing loss Skin: No rashes, lesions or wounds. MSK: No back pain, muscle spasm, no deformity, no limitation of range of movement in spin Heme: No easy bruising.  Travel history: No recent long distant travel.   Past Medical History:  Diagnosis Date  . High cholesterol   . Hypertension   . Stroke [facility])     Past Surgical History:  Procedure Laterality Date  . ABDOMINAL HYSTERECTOMY    . CATARACT EXTRACTION     bilateral  . CESAREAN SECTION       reports that she has never smoked. She quit smokeless tobacco use about 36 years ago. Her smokeless tobacco use included chew. She  reports that she does not drink alcohol or use drugs.  Allergies  Allergen Reactions  . Percocet [Oxycodone-Acetaminophen] Other (See Comments)    "patient can take" but prefers not to    Family History  Problem Relation Age of Onset  . Heart attack Mother   . Pneumonia Father   . Parkinsonism Father   . Breast cancer Sister     Prior to Admission medications   Medication Sig Start Date End Date Taking? Authorizing Provider  alendronate (FOSAMAX) 70 MG tablet Take 70 mg by mouth once a week. 05/20/12  Yes [provider]  aspirin EC 81 MG tablet Take 81 mg by mouth 3 (three) times a week.   Yes [provider]  atorvastatin (LIPITOR) 40 MG tablet Take 40 mg by mouth daily.     Yes [provider]  ketorolac (ACULAR) 0.5 % ophthalmic solution Place 1 drop into both eyes 4 (four) times daily.     Yes [provider]  pantoprazole (PROTONIX) 40 MG tablet Take 1 tablet (40 mg total) by mouth daily. Patient taking differently: Take 40 mg by mouth daily as needed (heartburn).  03/04/15  Yes Robbie Lis, MD  prednisoLONE acetate (PRED FORTE) 1 % ophthalmic suspension Place 1 drop into both eyes 4 (four) times daily.     Yes [provider]  Vitamin D, Ergocalciferol, (DRISDOL) 50000 UNITS CAPS capsule Take 50,000 Units by mouth every 30 (thirty) days.   Yes [provider]  Rivaroxaban Alveda Reasons  STARTER PACK) 15 & 20 MG TBPK Take as directed on package: Start with one '15mg'$  tablet by mouth twice a day with food. On Day 22, switch to one '20mg'$  tablet once a day with food. Patient not taking: Reported on 09/21/2017 03/04/15   Robbie Lis, MD    Physical Exam: Vitals:   09/28/2017 1500 09/23/2017 1545 09/08/2017 1600 09/20/2017 1630  BP: 120/63 (!) 115/58 128/69 (!) 112/50  Pulse: 71 67 80 69  Resp:  18    Temp:      TempSrc:      SpO2: 98% 97% 96% 95%  Weight:         Constitutional: NAD, calm, comfortable Eyes: PERRL, lids. Pale  conjunctiva  ENMT: Mucous membranes are moist. Posterior pharynx clear of any exudate or lesions.   Neck: normal, supple, no masses, no thyromegaly. No LAD Respiratory: clear to auscultation bilaterally, no wheezing, no crackles. Normal respiratory effort. Cardiovascular: Regular rate and rhythm, + Systolic murmur, no rub or gallops. No extremity edema. 2+ pedal pulses. Abdomen: no tenderness, no masses palpated. No hepatosplenomegaly. Bowel sounds positive.  Musculoskeletal: no clubbing / cyanosis. No joint deformity upper and lower extremities. Good ROM Skin: no rashes, lesions, ulcers. No induration Neurologic: CN 2-12 grossly intact. Sensation intact, DTR normal. Strength 5/5 in all 4.  Psychiatric: Normal judgment and insight. Alert and oriented x 3. Normal mood.   Labs on Admission: I have personally reviewed following labs and imaging studies  CBC: Recent Labs  Lab 09/03/2017 1425  WBC 2.0*  NEUTROABS 0.6*  HGB 8.3*  HCT 25.6*  MCV 99.2  PLT 52*   Basic Metabolic Panel: Recent Labs  Lab 09/17/2017 1425  NA 142  K 3.6  CL 107  CO2 26  GLUCOSE 116*  BUN 10  CREATININE 0.95  CALCIUM 8.4*   GFR: CrCl cannot be calculated (Unknown ideal weight.). Liver Function Tests: Recent Labs  Lab 09/23/2017 1425  AST 16  ALT 10*  ALKPHOS 66  BILITOT 0.8  PROT 7.4  ALBUMIN 3.5   Recent Labs  Lab 09/08/2017 1425  LIPASE 22   No results for input(s): AMMONIA in the last 168 hours. Coagulation Profile: No results for input(s): INR, PROTIME in the last 168 hours. Cardiac Enzymes: No results for input(s): CKTOTAL, CKMB, CKMBINDEX, TROPONINI in the last 168 hours. BNP (last 3 results) No results for input(s): PROBNP in the last 8760 hours. HbA1C: No results for input(s): HGBA1C in the last 72 hours. CBG: No results for input(s): GLUCAP in the last 168 hours. Lipid Profile: No results for input(s): CHOL, HDL, LDLCALC, TRIG, CHOLHDL, LDLDIRECT in the last 72 hours. Thyroid  Function Tests: No results for input(s): TSH, T4TOTAL, FREET4, T3FREE, THYROIDAB in the last 72 hours. Anemia Panel: Recent Labs    09/11/2017 1425  RETICCTPCT 1.0   Urine analysis:    Component Value Date/Time   COLORURINE YELLOW 05/31/2010 2325   APPEARANCEUR CLEAR 05/31/2010 2325   LABSPEC 1.015 05/31/2010 2325   PHURINE 7.0 05/31/2010 2325   GLUCOSEU NEGATIVE 08/08/2009 1317   HGBUR NEGATIVE 05/31/2010 2325   BILIRUBINUR NEGATIVE 05/31/2010 2325   KETONESUR NEGATIVE 05/31/2010 2325   PROTEINUR NEGATIVE 05/31/2010 2325   UROBILINOGEN 1.0 05/31/2010 2325   NITRITE NEGATIVE 05/31/2010 2325   LEUKOCYTESUR TRACE (A) 05/31/2010 2325   Sepsis Labs: !!!!!!!!!!!!!!!!!!!!!!!!!!!!!!!!!!!!!!!!!!!! '@LABRCNTIP'$ (procalcitonin:4,lacticidven:4) )No results found for this or any previous visit (from the past 240 hour(s)).   Radiological Exams on Admission: Dg Chest 2 View  Result  Date: 09/29/2017 CLINICAL DATA:  Generalized weakness EXAM: CHEST - 2 VIEW COMPARISON:  03/02/2015 FINDINGS: The lungs are well inflated. The cardiomediastinal contours are normal. Hazy opacities in the right lung base are unchanged. There is no pleural effusion or pneumothorax. IMPRESSION: No active cardiopulmonary disease. Electronically Signed   By: Ulyses Jarred M.D.   On: 09/23/2017 15:25    EKG: Ordered   Assessment/Plan Pancytopenia - with symptomatic anemia  Unclear etiology, WBC 2.0, hemoglobin 8.3, platelet 51 Admit to med surg  Type and screen. Transfuse 1 unit of PRBC Will obtain anemia panel, save peripheral smear, LDH, blood cultures, UA and TSH.  Check HIV, check FOBT.  Oncology consulted, may need bone marrow biopsy Check CBC in a.m.  Hypertension Not on antihypertensive medication Monitor BP for now  Systolic murmur Check EKG and echocardiogram Chest x-ray with no cardiomegaly Continue to monitor  DVT prophylaxis: SCDs Code Status: Full code Family Communication: Daughter at bedside    Disposition Plan: Anticipate discharge to previous home environment.  Consults called: Oncology  Admission status: Obs/Medsurg    Chipper Oman MD [facility] Pager: Text Page via [email or web address]  [phone]  If 7PM-7AM, please contact night-coverage [email or web address] Password [facility]  09/20/2017, 5:34 PM

## 2017-09-05 NOTE — Progress Notes (Deleted)
[place] [phone]   [phone]   CONSULT NOTE  REFERRING PHYSICIAN:    REASON FOR CONSULTATION: ***  HPI: Leah Lewis is a 83 y.o. female with a past medical history ***   Past Medical History:  Diagnosis Date  . High cholesterol   . Hypertension   . Stroke [facility])   :   Past Surgical History:  Procedure Laterality Date  . ABDOMINAL HYSTERECTOMY    . CATARACT EXTRACTION     bilateral  . CESAREAN SECTION    :   CURRENT MEDS: Current Outpatient Medications  Medication Sig Dispense Refill  . alendronate (FOSAMAX) 70 MG tablet Take 70 mg by mouth once a week.    Marland Kitchen atorvastatin (LIPITOR) 40 MG tablet Take 40 mg by mouth daily.      Marland Kitchen ketorolac (ACULAR) 0.5 % ophthalmic solution Place 1 drop into both eyes 4 (four) times daily.      . Menthol, Topical Analgesic, (ICY HOT EX) Apply 1 application topically 2 (two) times daily.    . pantoprazole (PROTONIX) 40 MG tablet Take 1 tablet (40 mg total) by mouth daily. 30 tablet 0  . prednisoLONE acetate (PRED FORTE) 1 % ophthalmic suspension Place 1 drop into both eyes 4 (four) times daily.      . Rivaroxaban (XARELTO STARTER PACK) 15 & 20 MG TBPK Take as directed on package: Start with one 15mg  tablet by mouth twice a day with food. On Day 22, switch to one 20mg  tablet once a day with food. 51 each 0  . Vitamin D, Ergocalciferol, (DRISDOL) 50000 UNITS CAPS capsule Take 50,000 Units by mouth every 30 (thirty) days.     No current facility-administered medications for this visit.     Allergies  Allergen Reactions  . Percocet [Oxycodone-Acetaminophen]     "patient can take" but prefers not to  :  Family History  Problem Relation Age of Onset  . Heart attack Mother   . Pneumonia Father   . Parkinsonism Father   . Breast cancer Sister   :  Social History   Socioeconomic History  . Marital status: Single    Spouse name: Not on file  . Number of children: Not on file  . Years  of education: Not on file  . Highest education level: Not on file  Occupational History  . Not on file  Social Needs  . Financial resource strain: Not on file  . Food insecurity:    Worry: Not on file    Inability: Not on file  . Transportation needs:    Medical: Not on file    Non-medical: Not on file  Tobacco Use  . Smoking status: Never Smoker  . Smokeless tobacco: Former Systems developer    Types: Chew  Substance and Sexual Activity  . Alcohol use: No  . Drug use: No  . Sexual activity: Not on file  Lifestyle  . Physical activity:    Days per week: Not on file    Minutes per session: Not on file  . Stress: Not on file  Relationships  . Social connections:    Talks on phone: Not on file    Gets together: Not on file    Attends religious service: Not on file    Active member of club or organization: Not on file    Attends meetings of clubs or organizations: Not on file    Relationship status: Not on file  . Intimate partner violence:  Fear of current or ex partner: Not on file    Emotionally abused: Not on file    Physically abused: Not on file    Forced sexual activity: Not on file  Other Topics Concern  . Not on file  Social History Narrative  . Not on file  :  REVIEW OF SYSTEMS:   Constitutional: Negative for appetite change, chills, fatigue, fever and unexpected weight change.  HENT:   Negative for mouth sores, nosebleeds, sore throat and trouble swallowing.   Eyes: Negative for eye problems and icterus.  Respiratory: Negative for cough, hemoptysis, shortness of breath and wheezing.   Cardiovascular: Negative for chest pain and leg swelling.  Gastrointestinal: Negative for abdominal pain, constipation, diarrhea, nausea and vomiting.  Genitourinary: Negative for bladder incontinence, difficulty urinating, dysuria, frequency and hematuria.   Musculoskeletal: Negative for back pain, gait problem, neck pain and neck stiffness.  Skin: Negative for itching and rash.    Neurological: Negative for dizziness, extremity weakness, gait problem, headaches, light-headedness and seizures.  Hematological: Negative for adenopathy. Does not bruise/bleed easily.  Psychiatric/Behavioral: Negative for confusion, depression and sleep disturbance. The patient is not nervous/anxious.     PHYSICAL EXAMINATION: There were no vitals taken for this visit.  ECOG PERFORMANCE STATUS: {CHL ONC ECOG [id number]  Physical Exam  Constitutional: Oriented to person, place, and time and well-developed, well-nourished, and in no distress. No distress.  HENT:  Head: Normocephalic and atraumatic.  Mouth/Throat: Oropharynx is clear and moist. No oropharyngeal exudate.  Eyes: Conjunctivae are normal. Right eye exhibits no discharge. Left eye exhibits no discharge. No scleral icterus.  Neck: Normal range of motion. Neck supple.  Cardiovascular: Normal rate, regular rhythm, normal heart sounds and intact distal pulses.   Pulmonary/Chest: Effort normal and breath sounds normal. No respiratory distress. No wheezes. No rales.  Abdominal: Soft. Bowel sounds are normal. Exhibits no distension and no mass. There is no tenderness.  Musculoskeletal: Normal range of motion. Exhibits no edema.  Lymphadenopathy:    No cervical adenopathy.  Neurological: Alert and oriented to person, place, and time. Exhibits normal muscle tone. Gait normal. Coordination normal.  Skin: Skin is warm and dry. No rash noted. Not diaphoretic. No erythema. No pallor.  Psychiatric: Mood, memory and judgment normal.  Vitals reviewed.    LABS:  Lab Results  Component Value Date   WBC 8.6 03/03/2015   HGB 10.9 (L) 03/03/2015   HCT 35.6 (L) 03/03/2015   PLT 131 (L) 03/03/2015   GLUCOSE 114 (H) 03/03/2015   CHOL 155 04/21/2011   TRIG 89 04/21/2011   HDL 49 04/21/2011   LDLCALC 88 04/21/2011   ALT 15 04/20/2011   AST 13 04/20/2011   NA 140 03/03/2015   K 4.1 03/03/2015   CL 104 03/03/2015   CREATININE 0.81  03/03/2015   BUN 9 03/03/2015   CO2 28 03/03/2015   INR 1.19 03/02/2015   HGBA1C (H) 04/03/2007    6.2 (NOTE)   The ADA recommends the following therapeutic goals for glycemic   control related to Hgb A1C measurement:   Goal of Therapy:   < 7.0% Hgb A1C   Action Suggested:  > 8.0% Hgb A1C   Ref:  Diabetes Care, 22, Suppl. 1, 1999    No results found.  ASSESSMENT:  PLAN: ***  She was advised to call immediately if she has any concerning symptoms in the interval. The patient voices understanding of current disease status and treatment options and is in agreement with the current  care plan.   All questions were answered. The patient knows to call the clinic with any problems, questions or concerns. We can certainly see the patient much sooner if necessary.   Thank you so much for allowing me to participate in the care of ***. I will continue to follow up the patient with you and assist in her care.  The length of time of the face-to-face encounter was *** minutes. More than 50% of time was spent counseling and coordination of care.

## 2017-09-05 NOTE — ED Notes (Signed)
Bed: FJ01 Expected date:  Expected time:  Means of arrival:  Comments: Res a

## 2017-09-05 NOTE — ED Notes (Signed)
ED TO INPATIENT HANDOFF REPORT  Name/Age/Gender Leah Lewis 83 y.o. female  Code Status Code Status History    Date Active Date Inactive Code Status Order ID Comments User Context   03/02/2015 2107 03/04/2015 1549 Full Code [id number]  Norval Morton, MD Inpatient   04/20/2011 1548 04/21/2011 1252 Full Code [id number]  Dupell, Gerald Dexter, RN ED      Home/SNF/Other Home  Chief Complaint AMS ( CANCER PT )   Level of Care/Admitting Diagnosis ED Disposition    ED Disposition Condition [place]: [facility] [[id number]]  Level of Care: Med-Surg [16]  Diagnosis: Anemia [884166]  Admitting Physician: Patrecia Pour, EDWIN [[id number]]  Attending Physician: Patrecia Pour, EDWIN [[id number]]  PT Class (Do Not Modify): Observation [104]  PT Acc Code (Do Not Modify): Observation [10022]       Medical History Past Medical History:  Diagnosis Date  . High cholesterol   . Hypertension   . Stroke [facility])     Allergies Allergies  Allergen Reactions  . Percocet [Oxycodone-Acetaminophen] Other (See Comments)    "patient can take" but prefers not to    IV Location/Drains/Wounds Patient Lines/Drains/Airways Status   Active Line/Drains/Airways    Name:   Placement date:   Placement time:   Site:   Days:   Peripheral IV 09/22/2017 Left Antecubital   09/27/2017    1425    Antecubital   less than 1   External Urinary Catheter   09/03/2017    1426    -   less than 1          Labs/Imaging Results for orders placed or performed during the hospital encounter of 09/13/2017 (from the past 48 hour(s))  CBC     Status: Abnormal   Collection Time: 09/23/2017  2:25 PM  Result Value Ref Range   WBC 2.0 (L) 4.0 - 10.5 K/uL   RBC 2.58 (L) 3.87 - 5.11 MIL/uL   Hemoglobin 8.3 (L) 12.0 - 15.0 g/dL   HCT 25.6 (L) 36.0 - 46.0 %   MCV 99.2 78.0 - 100.0 fL   MCH 32.2 26.0 - 34.0 pg   MCHC 32.4 30.0 - 36.0 g/dL   RDW 15.3 11.5 - 15.5 %   Platelets 52 (L) 150 - 400  K/uL    Comment: REPEATED TO VERIFY SPECIMEN CHECKED FOR CLOTS PLATELET COUNT CONFIRMED BY SMEAR Performed at [facility] [address], [place], [place] [address]   Comprehensive metabolic panel     Status: Abnormal   Collection Time: 09/29/2017  2:25 PM  Result Value Ref Range   Sodium 142 135 - 145 mmol/L   Potassium 3.6 3.5 - 5.1 mmol/L   Chloride 107 101 - 111 mmol/L   CO2 26 22 - 32 mmol/L   Glucose, Bld 116 (H) 65 - 99 mg/dL   BUN 10 6 - 20 mg/dL   Creatinine, Ser 0.95 0.44 - 1.00 mg/dL   Calcium 8.4 (L) 8.9 - 10.3 mg/dL   Total Protein 7.4 6.5 - 8.1 g/dL   Albumin 3.5 3.5 - 5.0 g/dL   AST 16 15 - 41 U/L   ALT 10 (L) 14 - 54 U/L   Alkaline Phosphatase 66 38 - 126 U/L   Total Bilirubin 0.8 0.3 - 1.2 mg/dL   GFR calc non Af Amer 55 (L) >60 mL/min   GFR calc Af Amer >60 >60 mL/min    Comment: (NOTE) The eGFR has been  calculated using the CKD EPI equation. This calculation has not been validated in all clinical situations. eGFR's persistently <60 mL/min signify possible Chronic Kidney Disease.    Anion gap 9 5 - 15    Comment: Performed at [facility], [place] [address], [place], [place] [address]  Lipase, blood     Status: None   Collection Time: 09/28/2017  2:25 PM  Result Value Ref Range   Lipase 22 11 - 51 U/L    Comment: Performed at [facility], [place] [address] [place], [place] [address]  Differential     Status: None (Preliminary result)   Collection Time: 09/20/2017  2:25 PM  Result Value Ref Range   Neutrophils Relative % PENDING %   Neutro Abs PENDING 1.7 - 7.7 K/uL   Band Neutrophils PENDING %   Lymphocytes Relative PENDING %   Lymphs Abs PENDING 0.7 - 4.0 K/uL   Monocytes Relative PENDING %   Monocytes Absolute PENDING 0.1 - 1.0 K/uL   Eosinophils Relative PENDING %   Eosinophils Absolute PENDING 0.0 - 0.7 K/uL   Basophils Relative PENDING %   Basophils Absolute PENDING 0.0 - 0.1 K/uL   WBC  Morphology PENDING    RBC Morphology PENDING    Smear Review PENDING    nRBC PENDING 0 /100 WBC   Metamyelocytes Relative PENDING %   Myelocytes PENDING %   Promyelocytes Relative PENDING %   Blasts PENDING %  Reticulocytes     Status: Abnormal   Collection Time: 09/21/2017  2:25 PM  Result Value Ref Range   Retic Ct Pct 1.0 0.4 - 3.1 %   RBC. 2.54 (L) 3.87 - 5.11 MIL/uL   Retic Count, Absolute 25.4 19.0 - 186.0 K/uL    Comment: Performed at [facility], [place] [address], [place], [place] [address]  POC occult blood, ED Provider will collect     Status: None   Collection Time: 09/22/2017  4:24 PM  Result Value Ref Range   Fecal Occult Bld NEGATIVE NEGATIVE   Dg Chest 2 View  Result Date: 09/03/2017 CLINICAL DATA:  Generalized weakness EXAM: CHEST - 2 VIEW COMPARISON:  03/02/2015 FINDINGS: The lungs are well inflated. The cardiomediastinal contours are normal. Hazy opacities in the right lung base are unchanged. There is no pleural effusion or pneumothorax. IMPRESSION: No active cardiopulmonary disease. Electronically Signed   By: Ulyses Jarred M.D.   On: 09/03/2017 15:25    Pending Labs Unresulted Labs (From admission, onward)   Start     Ordered   09/27/2017 1702  CBC with Differential/Platelet  STAT,   R     09/20/2017 1701   09/29/2017 1623  Vitamin B12  (Anemia Panel (PNL))  STAT,   STAT     09/21/2017 1622   09/03/2017 1623  Folate  (Anemia Panel (PNL))  STAT,   STAT     10/02/2017 1622   10/02/2017 1623  Iron and TIBC  (Anemia Panel (PNL))  STAT,   STAT     09/23/2017 1622   09/20/2017 1623  Ferritin  (Anemia Panel (PNL))  STAT,   STAT     09/09/2017 1622   09/16/2017 1425  Pathologist smear review  Once,   R     09/14/2017 1425   09/07/2017 1349  Urinalysis, Routine w reflex microscopic  Once,   R     09/27/2017 1348      Vitals/Pain Today's Vitals   09/12/2017 1500 09/23/2017 1545 09/06/2017  1600 09/09/2017 1630  BP: 120/63 (!) 115/58 128/69 (!) 112/50  Pulse: 71 67 80 69  Resp:   18    Temp:      TempSrc:      SpO2: 98% 97% 96% 95%  Weight:      PainSc:        Isolation Precautions No active isolations  Medications Medications - No data to display  Mobility non-ambulatory

## 2017-09-05 NOTE — ED Provider Notes (Addendum)
[place] DEPT Provider Note   CSN: [id number] Arrival date & time: 09/20/2017  1300     History   Chief Complaint Chief Complaint  Patient presents with  . Fatigue    HPI Leah Lewis is a 84 y.o. female.  HPI Patient is an 83 year old female presents the emergency department with worsening generalized weakness and fatigue over the past week.  She states she can now barely walk across the room secondary to generalized weakness.  No chest pain or shortness of breath.  No symptoms at rest.  No blood in her stool that she knows of however she has issues with her vision.  Denies back pain.  No urinary complaints.  No fevers or chills.  No chest pain or shortness of breath.  Symptoms are moderate in severity.   Past Medical History:  Diagnosis Date  . High cholesterol   . Hypertension   . Stroke [facility])     Patient Active Problem List   Diagnosis Date Noted  . DVT (deep venous thrombosis), left 03/02/2015  . DVT (deep venous thrombosis), unspecified laterality 03/02/2015  . GERD (gastroesophageal reflux disease) 03/02/2015  . Anemia 03/02/2015  . Thrombocytopenia ([place]) 03/02/2015  . Chest pain 04/20/2011  . Cervical radiculopathy 04/20/2011  . Hypertension   . Stroke ([place])   . High cholesterol     Past Surgical History:  Procedure Laterality Date  . ABDOMINAL HYSTERECTOMY    . CATARACT EXTRACTION     bilateral  . CESAREAN SECTION       OB History   None      Home Medications    Prior to Admission medications   Medication Sig Start Date End Date Taking? Authorizing Provider  alendronate (FOSAMAX) 70 MG tablet Take 70 mg by mouth once a week. 05/20/12  Yes [provider]  aspirin EC 81 MG tablet Take 81 mg by mouth 3 (three) times a week.   Yes [provider]  atorvastatin (LIPITOR) 40 MG tablet Take 40 mg by mouth daily.     Yes [provider]  ketorolac (ACULAR) 0.5 % ophthalmic solution Place 1  drop into both eyes 4 (four) times daily.     Yes [provider]  pantoprazole (PROTONIX) 40 MG tablet Take 1 tablet (40 mg total) by mouth daily. Patient taking differently: Take 40 mg by mouth daily as needed (heartburn).  03/04/15  Yes Robbie Lis, MD  prednisoLONE acetate (PRED FORTE) 1 % ophthalmic suspension Place 1 drop into both eyes 4 (four) times daily.     Yes [provider]  Vitamin D, Ergocalciferol, (DRISDOL) 50000 UNITS CAPS capsule Take 50,000 Units by mouth every 30 (thirty) days.   Yes [provider]  Rivaroxaban (XARELTO STARTER PACK) 15 & 20 MG TBPK Take as directed on package: Start with one 15mg  tablet by mouth twice a day with food. On Day 22, switch to one 20mg  tablet once a day with food. Patient not taking: Reported on 09/12/2017 03/04/15   Robbie Lis, MD    Family History Family History  Problem Relation Age of Onset  . Heart attack Mother   . Pneumonia Father   . Parkinsonism Father   . Breast cancer Sister     Social History Social History   Tobacco Use  . Smoking status: Never Smoker  . Smokeless tobacco: Former Systems developer    Types: Chew  Substance Use Topics  . Alcohol use: No  . Drug use:  No     Allergies   Percocet [oxycodone-acetaminophen]   Review of Systems Review of Systems  All other systems reviewed and are negative.    Physical Exam Updated Vital Signs BP (!) 115/58   Pulse 67   Temp 98.4 F (36.9 C) (Oral)   Resp 18   Wt 68.9 kg (152 lb)   SpO2 97%   BMI 26.09 kg/m   Physical Exam  Constitutional: She is oriented to person, place, and time. She appears well-developed and well-nourished. No distress.  HENT:  Head: Normocephalic and atraumatic.  Eyes: EOM are normal.  Neck: Normal range of motion.  Cardiovascular: Normal rate, regular rhythm and normal heart sounds.  Pulmonary/Chest: Effort normal and breath sounds normal.  Abdominal: Soft. She exhibits no distension. There is no  tenderness.  Genitourinary:  Genitourinary Comments: Chaperone present.  Rectal exam demonstrates brown stool.  No gross blood.  Musculoskeletal: Normal range of motion.  Neurological: She is alert and oriented to person, place, and time.  Skin: Skin is warm and dry.  Psychiatric: She has a normal mood and affect. Judgment normal.  Nursing note and vitals reviewed.    ED Treatments / Results  Labs (all labs ordered are listed, but only abnormal results are displayed) Labs Reviewed  CBC - Abnormal; Notable for the following components:      Result Value   WBC 2.0 (*)    RBC 2.58 (*)    Hemoglobin 8.3 (*)    HCT 25.6 (*)    Platelets 52 (*)    All other components within normal limits  COMPREHENSIVE METABOLIC PANEL - Abnormal; Notable for the following components:   Glucose, Bld 116 (*)    Calcium 8.4 (*)    ALT 10 (*)    GFR calc non Af Amer 55 (*)    All other components within normal limits  LIPASE, BLOOD  URINALYSIS, ROUTINE W REFLEX MICROSCOPIC  VITAMIN B12  FOLATE  IRON AND TIBC  FERRITIN  RETICULOCYTES  DIFFERENTIAL  POC OCCULT BLOOD, ED    EKG None  Radiology Dg Chest 2 View  Result Date: 09/14/2017 CLINICAL DATA:  Generalized weakness EXAM: CHEST - 2 VIEW COMPARISON:  03/02/2015 FINDINGS: The lungs are well inflated. The cardiomediastinal contours are normal. Hazy opacities in the right lung base are unchanged. There is no pleural effusion or pneumothorax. IMPRESSION: No active cardiopulmonary disease. Electronically Signed   By: Ulyses Jarred M.D.   On: 09/20/2017 15:25    Procedures .Critical Care Performed by: Jola Schmidt, MD Authorized by: Jola Schmidt, MD    CRITICAL CARE Performed by: Jola Schmidt Total critical care time: 30 minutes Critical care time was exclusive of separately billable procedures and treating other patients. Critical care was necessary to treat or prevent imminent or life-threatening deterioration. Critical care was time  spent personally by me on the following activities: development of treatment plan with patient and/or surrogate as well as nursing, discussions with consultants, evaluation of patient's response to treatment, examination of patient, obtaining history from patient or surrogate, ordering and performing treatments and interventions, ordering and review of laboratory studies, ordering and review of radiographic studies, pulse oximetry and re-evaluation of patient's condition.   Medications Ordered in ED Medications - No data to display   Initial Impression / Assessment and Plan / ED Course  I have reviewed the triage vital signs and the nursing notes.  Pertinent labs & imaging results that were available during my care of the patient were reviewed  by me and considered in my medical decision making (see chart for details).     Patient with new pancytopenia.  Hemoglobin 8.2.  Hemoccult negative.  Patient will be admitted to the hospital for additional work-up.  She can barely walk across the room secondary to generalized weakness.  Anemia panel ordered.  Final Clinical Impressions(s) / ED Diagnoses   Final diagnoses:  Pancytopenia [facility])    ED Discharge Orders    None       Jola Schmidt, MD 09/26/2017 [place], MD 09/20/17 [phone]

## 2017-09-05 NOTE — ED Notes (Signed)
Pt walked with walked to door and back to bed. Pt stated she felt very weak when up and moving but felt okay when laying down

## 2017-09-05 NOTE — Progress Notes (Signed)
CRITICAL VALUE ALERT  Critical Value:  HGB 5.3  Date & Time Notied: 09/05/17  1955  Provider Notified: Schorr  Orders Received/Actions taken: 2 additional units ordered of PRBCs

## 2017-09-05 NOTE — ED Notes (Signed)
Hospitalist at bedside 

## 2017-09-05 NOTE — ED Notes (Signed)
Patient transported to X-ray 

## 2017-09-05 NOTE — ED Provider Notes (Signed)
D/w hospitalist, will admit.   Sherwood Gambler, MD 10/02/2017 [phone]

## 2017-09-06 ENCOUNTER — Other Ambulatory Visit: Payer: Self-pay

## 2017-09-06 DIAGNOSIS — M7989 Other specified soft tissue disorders: Secondary | ICD-10-CM | POA: Diagnosis not present

## 2017-09-06 DIAGNOSIS — D630 Anemia in neoplastic disease: Secondary | ICD-10-CM | POA: Diagnosis not present

## 2017-09-06 DIAGNOSIS — D61810 Antineoplastic chemotherapy induced pancytopenia: Secondary | ICD-10-CM | POA: Diagnosis not present

## 2017-09-06 DIAGNOSIS — I358 Other nonrheumatic aortic valve disorders: Secondary | ICD-10-CM | POA: Diagnosis present

## 2017-09-06 DIAGNOSIS — Z803 Family history of malignant neoplasm of breast: Secondary | ICD-10-CM | POA: Diagnosis not present

## 2017-09-06 DIAGNOSIS — E870 Hyperosmolality and hypernatremia: Secondary | ICD-10-CM | POA: Diagnosis not present

## 2017-09-06 DIAGNOSIS — T451X5A Adverse effect of antineoplastic and immunosuppressive drugs, initial encounter: Secondary | ICD-10-CM | POA: Diagnosis present

## 2017-09-06 DIAGNOSIS — I5032 Chronic diastolic (congestive) heart failure: Secondary | ICD-10-CM | POA: Diagnosis present

## 2017-09-06 DIAGNOSIS — E440 Moderate protein-calorie malnutrition: Secondary | ICD-10-CM | POA: Diagnosis present

## 2017-09-06 DIAGNOSIS — Z886 Allergy status to analgesic agent status: Secondary | ICD-10-CM | POA: Diagnosis not present

## 2017-09-06 DIAGNOSIS — G9341 Metabolic encephalopathy: Secondary | ICD-10-CM | POA: Diagnosis not present

## 2017-09-06 DIAGNOSIS — D6959 Other secondary thrombocytopenia: Secondary | ICD-10-CM | POA: Diagnosis present

## 2017-09-06 DIAGNOSIS — E1165 Type 2 diabetes mellitus with hyperglycemia: Secondary | ICD-10-CM | POA: Diagnosis not present

## 2017-09-06 DIAGNOSIS — C9201 Acute myeloblastic leukemia, in remission: Secondary | ICD-10-CM | POA: Diagnosis not present

## 2017-09-06 DIAGNOSIS — D696 Thrombocytopenia, unspecified: Secondary | ICD-10-CM | POA: Diagnosis not present

## 2017-09-06 DIAGNOSIS — R5383 Other fatigue: Secondary | ICD-10-CM | POA: Diagnosis not present

## 2017-09-06 DIAGNOSIS — Z87891 Personal history of nicotine dependence: Secondary | ICD-10-CM

## 2017-09-06 DIAGNOSIS — A419 Sepsis, unspecified organism: Secondary | ICD-10-CM | POA: Diagnosis not present

## 2017-09-06 DIAGNOSIS — R945 Abnormal results of liver function studies: Secondary | ICD-10-CM | POA: Diagnosis not present

## 2017-09-06 DIAGNOSIS — J9601 Acute respiratory failure with hypoxia: Secondary | ICD-10-CM | POA: Diagnosis not present

## 2017-09-06 DIAGNOSIS — I10 Essential (primary) hypertension: Secondary | ICD-10-CM | POA: Diagnosis not present

## 2017-09-06 DIAGNOSIS — C9202 Acute myeloblastic leukemia, in relapse: Secondary | ICD-10-CM | POA: Diagnosis not present

## 2017-09-06 DIAGNOSIS — I110 Hypertensive heart disease with heart failure: Secondary | ICD-10-CM | POA: Diagnosis present

## 2017-09-06 DIAGNOSIS — I351 Nonrheumatic aortic (valve) insufficiency: Secondary | ICD-10-CM | POA: Diagnosis not present

## 2017-09-06 DIAGNOSIS — I361 Nonrheumatic tricuspid (valve) insufficiency: Secondary | ICD-10-CM | POA: Diagnosis not present

## 2017-09-06 DIAGNOSIS — D61818 Other pancytopenia: Secondary | ICD-10-CM | POA: Diagnosis not present

## 2017-09-06 DIAGNOSIS — Z66 Do not resuscitate: Secondary | ICD-10-CM | POA: Diagnosis not present

## 2017-09-06 DIAGNOSIS — L8990 Pressure ulcer of unspecified site, unspecified stage: Secondary | ICD-10-CM

## 2017-09-06 DIAGNOSIS — I481 Persistent atrial fibrillation: Secondary | ICD-10-CM | POA: Diagnosis not present

## 2017-09-06 DIAGNOSIS — R6521 Severe sepsis with septic shock: Secondary | ICD-10-CM | POA: Diagnosis not present

## 2017-09-06 DIAGNOSIS — E7800 Pure hypercholesterolemia, unspecified: Secondary | ICD-10-CM | POA: Diagnosis not present

## 2017-09-06 DIAGNOSIS — I959 Hypotension, unspecified: Secondary | ICD-10-CM | POA: Diagnosis not present

## 2017-09-06 DIAGNOSIS — C9200 Acute myeloblastic leukemia, not having achieved remission: Secondary | ICD-10-CM | POA: Diagnosis present

## 2017-09-06 DIAGNOSIS — B370 Candidal stomatitis: Secondary | ICD-10-CM | POA: Diagnosis not present

## 2017-09-06 DIAGNOSIS — E785 Hyperlipidemia, unspecified: Secondary | ICD-10-CM | POA: Diagnosis present

## 2017-09-06 DIAGNOSIS — D649 Anemia, unspecified: Secondary | ICD-10-CM | POA: Diagnosis not present

## 2017-09-06 DIAGNOSIS — I77819 Aortic ectasia, unspecified site: Secondary | ICD-10-CM | POA: Diagnosis present

## 2017-09-06 DIAGNOSIS — E876 Hypokalemia: Secondary | ICD-10-CM | POA: Diagnosis not present

## 2017-09-06 DIAGNOSIS — L89150 Pressure ulcer of sacral region, unstageable: Secondary | ICD-10-CM | POA: Diagnosis not present

## 2017-09-06 DIAGNOSIS — I4891 Unspecified atrial fibrillation: Secondary | ICD-10-CM | POA: Diagnosis not present

## 2017-09-06 DIAGNOSIS — Z6824 Body mass index (BMI) 24.0-24.9, adult: Secondary | ICD-10-CM | POA: Diagnosis not present

## 2017-09-06 DIAGNOSIS — J181 Lobar pneumonia, unspecified organism: Secondary | ICD-10-CM | POA: Diagnosis not present

## 2017-09-06 DIAGNOSIS — Z515 Encounter for palliative care: Secondary | ICD-10-CM | POA: Diagnosis present

## 2017-09-06 DIAGNOSIS — Z8673 Personal history of transient ischemic attack (TIA), and cerebral infarction without residual deficits: Secondary | ICD-10-CM

## 2017-09-06 DIAGNOSIS — I482 Chronic atrial fibrillation: Secondary | ICD-10-CM | POA: Diagnosis not present

## 2017-09-06 DIAGNOSIS — M810 Age-related osteoporosis without current pathological fracture: Secondary | ICD-10-CM | POA: Diagnosis present

## 2017-09-06 DIAGNOSIS — R7989 Other specified abnormal findings of blood chemistry: Secondary | ICD-10-CM | POA: Diagnosis not present

## 2017-09-06 DIAGNOSIS — R509 Fever, unspecified: Secondary | ICD-10-CM | POA: Diagnosis not present

## 2017-09-06 DIAGNOSIS — Z7189 Other specified counseling: Secondary | ICD-10-CM | POA: Diagnosis not present

## 2017-09-06 DIAGNOSIS — I82402 Acute embolism and thrombosis of unspecified deep veins of left lower extremity: Secondary | ICD-10-CM | POA: Diagnosis not present

## 2017-09-06 DIAGNOSIS — R531 Weakness: Secondary | ICD-10-CM | POA: Diagnosis present

## 2017-09-06 DIAGNOSIS — R5081 Fever presenting with conditions classified elsewhere: Secondary | ICD-10-CM | POA: Diagnosis not present

## 2017-09-06 LAB — OCCULT BLOOD X 1 CARD TO LAB, STOOL: FECAL OCCULT BLD: NEGATIVE

## 2017-09-06 LAB — URINALYSIS, ROUTINE W REFLEX MICROSCOPIC
BILIRUBIN URINE: NEGATIVE
Glucose, UA: NEGATIVE mg/dL
HGB URINE DIPSTICK: NEGATIVE
Ketones, ur: NEGATIVE mg/dL
NITRITE: NEGATIVE
PH: 7 (ref 5.0–8.0)
Protein, ur: NEGATIVE mg/dL
SPECIFIC GRAVITY, URINE: 1.008 (ref 1.005–1.030)

## 2017-09-06 LAB — ABO/RH: ABO/RH(D): A POS

## 2017-09-06 LAB — PLATELET COUNT: PLATELETS: 36 10*3/uL — AB (ref 150–400)

## 2017-09-06 LAB — RETICULOCYTES
RBC.: 3.89 MIL/uL (ref 3.87–5.11)
RETIC COUNT ABSOLUTE: 27.2 10*3/uL (ref 19.0–186.0)
RETIC CT PCT: 0.7 % (ref 0.4–3.1)

## 2017-09-06 LAB — HEMOGLOBIN AND HEMATOCRIT, BLOOD
HCT: 35.9 % — ABNORMAL LOW (ref 36.0–46.0)
Hemoglobin: 11.8 g/dL — ABNORMAL LOW (ref 12.0–15.0)

## 2017-09-06 LAB — PATHOLOGIST SMEAR REVIEW

## 2017-09-06 NOTE — Evaluation (Addendum)
Physical Therapy Evaluation Patient Details Name: Leah Lewis MRN: [id number] DOB: [birth year] Today's Date: 09/06/2017   History of Present Illness  83 y.o. female with medical history significant of hypertension, prior stroke hx of left DVT.  Patient presented to the emergency department complaining of generalized weakness and fatigue. Dx of anemia. s/p transfusion of 3 units of blood. Oncology workup pending.   Clinical Impression  Pt admitted with above diagnosis. Pt currently with functional limitations due to the deficits listed below (see PT Problem List). Pt ambulated 100' with RW, no loss of balance, distance limited by fatigue. Pt walks with rollator at baseline.  Pt will benefit from skilled PT to increase their independence and safety with mobility to allow discharge to the venue listed below.       Follow Up Recommendations Home health PT;Other (comment) (home health aide to assist with housekeeping, etc)    Equipment Recommendations  None recommended by PT    Recommendations for Other Services       Precautions / Restrictions Precautions Precautions: Fall;Other (comment) Precaution Comments: reports no h/o falls in past 1 year; pt is legally blind but can see objects Restrictions Weight Bearing Restrictions: No      Mobility  Bed Mobility Overal bed mobility: Modified Independent             General bed mobility comments: with rail, HOB up 25*  Transfers Overall transfer level: Needs assistance Equipment used: Rolling walker (2 wheeled) Transfers: Sit to/from Stand Sit to Stand: Min guard         General transfer comment: min/guard for safety  Ambulation/Gait Ambulation/Gait assistance: Min guard Ambulation Distance (Feet): 100 Feet Assistive device: Rolling walker (2 wheeled) Gait Pattern/deviations: Step-through pattern;Decreased stride length     General Gait Details: steady, no LOB, distance limited by fatigue  Stairs             Wheelchair Mobility    Modified Rankin (Stroke Patients Only)       Balance Overall balance assessment: Modified Independent                                           Pertinent Vitals/Pain Pain Assessment: No/denies pain    Home Living Family/patient expects to be discharged to:: Private residence Living Arrangements: Children Available Help at Discharge: Family;Available 24 hours/day   Home Access: Ramped entrance       Home Equipment: Clinical cytogeneticist - 4 wheels Additional Comments: lives with daughter, other daughter gets pt's groceries    Prior Function Level of Independence: Independent with assistive device(s)         Comments: walks with rollator     Hand Dominance        Extremity/Trunk Assessment   Upper Extremity Assessment Upper Extremity Assessment: Overall WFL for tasks assessed    Lower Extremity Assessment Lower Extremity Assessment: Overall WFL for tasks assessed    Cervical / Trunk Assessment Cervical / Trunk Assessment: Kyphotic  Communication   Communication: No difficulties  Cognition Arousal/Alertness: Awake/alert Behavior During Therapy: WFL for tasks assessed/performed Overall Cognitive Status: Within Functional Limits for tasks assessed                                        General Comments  Exercises     Assessment/Plan    PT Assessment Patient needs continued PT services  PT Problem List Decreased activity tolerance       PT Treatment Interventions Gait training;Functional mobility training;Patient/family education    PT Goals (Current goals can be found in the Care Plan section)  Acute Rehab PT Goals Patient Stated Goal: return home, be able to do housework PT Goal Formulation: With patient Time For Goal Achievement: 09/20/17 Potential to Achieve Goals: Good    Frequency Min 3X/week   Barriers to discharge Decreased caregiver support pt reports her daughter  doesn't provide much assistance and that she would benefit from help with housekeeping, pt is legally blind    Co-evaluation               AM-PAC PT "6 Clicks" Daily Activity  Outcome Measure Difficulty turning over in bed (including adjusting bedclothes, sheets and blankets)?: None Difficulty moving from lying on back to sitting on the side of the bed? : A Little Difficulty sitting down on and standing up from a chair with arms (e.g., wheelchair, bedside commode, etc,.)?: A Little Help needed moving to and from a bed to chair (including a wheelchair)?: A Little Help needed walking in hospital room?: A Little Help needed climbing 3-5 steps with a railing? : A Lot 6 Click Score: 18    End of Session Equipment Utilized During Treatment: Gait belt Activity Tolerance: Patient tolerated treatment well Patient left: in chair;with call bell/phone within reach Nurse Communication: Mobility status PT Visit Diagnosis: Difficulty in walking, not elsewhere classified (R26.2)    Time: 8850-2774 PT Time Calculation (min) (ACUTE ONLY): 17 min   Charges:   PT Evaluation $PT Eval Low Complexity: 1 Low     PT G CodesPhilomena Doheny 09/06/2017, 1:53 PM [phone]

## 2017-09-06 NOTE — Consult Note (Signed)
Referral MD  Reason for Referral: Pancytopenia  Chief Complaint  Patient presents with  . Fatigue  : I got very tired.  HPI: Leah Lewis is an incredibly charming 83 year old [name].  She is originally from the [place] of [place].  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  She retired or should I say was downsized back in 2002 and came back to [place].  She has been pretty healthy.  She really has had no significant medical problems.  There apparently is a history of a CVA.  She has high blood pressure.  Going back to 2016, she had a normal CBC.  She is lost weight.  She just does not feel all that good.  She is got more tired.  She came to the hospital on June 3.  Her blood count showed a white cell count of 2 hemoglobin 8.3 and platelet count 52,000.  Her MCV was 99.  Her graph she was hydrated.  A follow-up CBC showed a white cell count 2.7.  Hemoglobin 5.3.  Lately count 61,000.  She had a relatively normal white cell differential.  She is had 3 units of blood.  A chest x-ray on admission was normal.  Her stool was heme negative.  She had a low normal vitamin B12 level 222.  Her iron studies showed a ferritin of 646 with iron saturation of 38%.  Her LDH was mildly elevated at 239.  She had a normal BUN and creatinine.  Her blood sugar was 116.  Calcium was 8.4.  Her total protein was 7.4 with an albumin of 3.5.  She has not noted any palpable lymph glands.  She says that she gets her mammograms.  There is no obvious blood problems in the family.  There is no history of sickle cell disease.  She says she lost about 30 pounds in about 2 or 3 months.  She does not have much of an appetite.  She does not smoke.  She is not drink.  She has a strong faith.  We had an excellent prayer session.  I did look at her blood smear.  I really saw nothing on the blood smear that gave me an idea as to what her problem was.  She  is not a vegetarian.  She has not noted any rashes.  Overall, I would say that her performance status is ECOG 3.    Past Medical History:  Diagnosis Date  . High cholesterol   . Hypertension   . Stroke [facility])   :  Past Surgical History:  Procedure Laterality Date  . ABDOMINAL HYSTERECTOMY    . CATARACT EXTRACTION     bilateral  . CESAREAN SECTION    :   Current Facility-Administered Medications:  .  acetaminophen (TYLENOL) tablet 650 mg, 650 mg, Oral, Q6H PRN **OR** acetaminophen (TYLENOL) suppository 650 mg, 650 mg, Rectal, Q6H PRN, Patrecia Pour, Edwin, MD .  ketorolac (ACULAR) 0.5 % ophthalmic solution 1 drop, 1 drop, Both Eyes, QID, Patrecia Pour, Edwin, MD .  prednisoLONE acetate (PRED FORTE) 1 % ophthalmic suspension 1 drop, 1 drop, Both Eyes, QID, Patrecia Pour, Christean Grief, MD, 1 drop at 09/21/2017 2136:  . ketorolac  1 drop Both Eyes QID  . prednisoLONE acetate  1 drop Both Eyes QID  :  Allergies  Allergen Reactions  . Percocet [Oxycodone-Acetaminophen] Other (See Comments)    "patient can take" but prefers not to  :  Family History  Problem Relation Age of Onset  . Heart attack Mother   . Pneumonia Father   . Parkinsonism Father   . Breast cancer Sister   :  Social History   Socioeconomic History  . Marital status: Single    Spouse name: Not on file  . Number of children: Not on file  . Years of education: Not on file  . Highest education level: Not on file  Occupational History  . Not on file  Social Needs  . Financial resource strain: Not on file  . Food insecurity:    Worry: Not on file    Inability: Not on file  . Transportation needs:    Medical: Not on file    Non-medical: Not on file  Tobacco Use  . Smoking status: Never Smoker  . Smokeless tobacco: Former Systems developer    Types: Chew  Substance and Sexual Activity  . Alcohol use: No  . Drug use: No  . Sexual activity: Not on file  Lifestyle  . Physical activity:    Days per week: Not on file     Minutes per session: Not on file  . Stress: Not on file  Relationships  . Social connections:    Talks on phone: Not on file    Gets together: Not on file    Attends religious service: Not on file    Active member of club or organization: Not on file    Attends meetings of clubs or organizations: Not on file    Relationship status: Not on file  . Intimate partner violence:    Fear of current or ex partner: Not on file    Emotionally abused: Not on file    Physically abused: Not on file    Forced sexual activity: Not on file  Other Topics Concern  . Not on file  Social History Narrative  . Not on file  :  Pertinent items are noted in HPI.  Exam: As above Patient Vitals for the past 24 hrs:  BP Temp Temp src Pulse Resp SpO2 Height Weight  09/06/17 0700 - - - - - - 5\' 4"  (1.626 m) -  09/06/17 0658 136/77 99.1 F (37.3 C) Oral 74 20 100 % - -  09/06/17 0536 127/67 98.3 F (36.8 C) Oral 74 15 100 % - -  09/06/17 0419 (!) 124/53 98.2 F (36.8 C) Axillary 68 (!) 24 99 % - -  09/06/17 0351 (!) 121/55 99.6 F (37.6 C) Oral 64 (!) 24 98 % - -  09/06/17 0112 (!) 109/56 99.7 F (37.6 C) Oral 68 (!) 24 98 % - -  09/06/17 0040 (!) 101/52 99.8 F (37.7 C) Oral 69 (!) 24 99 % - -  09/27/2017 2158 (!) 143/60 98 F (36.7 C) Oral 66 (!) 22 98 % - -  09/13/2017 2121 108/68 99.4 F (37.4 C) Oral 73 18 99 % - -  09/09/2017 2047 96/80 98.8 F (37.1 C) Oral (!) 107 18 92 % - -  09/19/2017 1847 (!) 122/56 99.1 F (37.3 C) Oral 79 18 99 % - -  09/30/2017 1753 119/63 - - 78 18 97 % - -  09/04/2017 1630 (!) 112/50 - - 69 - 95 % - -  09/04/2017 1600 128/69 - - 80 - 96 % - -  09/13/2017 1545 (!) 115/58 - - 67 18 97 % - -  09/24/2017 1500 120/63 - - 71 - 98 % - -  09/25/2017 1430 120/70 - -  69 - 96 % - -  09/07/2017 1416 - - - - - - - 152 lb (68.9 kg)  09/04/2017 1400 118/61 - - 69 - 97 % - -  09/11/2017 1334 (!) 127/57 - - 66 18 97 % - -  09/06/2017 1311 (!) 150/70 - - 66 18 98 % - -  09/29/2017 1309 - 98.4 F (36.9  C) Oral - - - - -     Recent Labs    09/29/2017 1425 09/19/2017 1919  WBC 2.0* 2.7*  HGB 8.3* 5.3*  HCT 25.6* 16.3*  PLT 52* 61*   Recent Labs    09/17/2017 1425  NA 142  K 3.6  CL 107  CO2 26  GLUCOSE 116*  BUN 10  CREATININE 0.95  CALCIUM 8.4*    Blood smear review: Normochromic and normocytic population of red blood cells.  There are no nucleated red blood cells.  I see no schistocytes.  There are no teardrop cells.  I see no rouleaux formation.  She has no target cells.  There are no inclusion bodies.  White blood cells are decreased in number.  She has no hypersegmented polys.  I see no immature blasts.  I see no atypical lymphoid cells.  Platelets are decreased in number.  Platelets are uniform in size.  Pathology: None    Assessment and Plan: Leah Lewis is a very charming 83 year old [name].  She has pancytopenia.  This is significant.  I have to suspect that she is going to have myelodysplasia.  I do not see anything that looks like acute leukemia on her blood smear.  With pancytopenia, one can always think of hairy cell leukemia.  I think that a bone marrow is necessary.  Again I do not see anything on her blood smear that shows me an obvious etiology.  I cannot palpate a spleen.  There is no lymphadenopathy.  I think her reticulocyte count would also help.  An erythropoietin level also will be helpful.  I spent a good 15 minutes with her.  Again we had a very good prayer session.  Her faith is incredibly strong.  We will follow along.  I know that she we will get incredibly compassionate care from all the staff up on 6 E.  Leah Haw, MD  Jeneen Rinks 1:5-7

## 2017-09-06 NOTE — Consult Note (Signed)
Chief Complaint: Patient was seen in consultation today for pancytopenia.  Referring Physician(s): Volanda Napoleon  Supervising Physician: Marybelle Killings  Patient Status: [facility] - In-pt  History of Present Illness: Leah Lewis is a 83 y.o. female with a past medical history of hypertension, high cholesterol, CVA 2009, left DVT, and GERD. She presented to ED 09/25/2017 with complaints of fatigue and generalized weakness. Her work-up revealed pancytopenia.  IR requested by Dr. Marin Olp for possible image-guided bone marrow biopsy and aspiration. Patient awake and alert laying in bed with no complaints at this time. Denies fever, chest pain, dyspnea, abdominal pain, or dizziness.  Past Medical History:  Diagnosis Date  . High cholesterol   . Hypertension   . Stroke [facility])     Past Surgical History:  Procedure Laterality Date  . ABDOMINAL HYSTERECTOMY    . CATARACT EXTRACTION     bilateral  . CESAREAN SECTION      Allergies: Percocet [oxycodone-acetaminophen]  Medications: Prior to Admission medications   Medication Sig Start Date End Date Taking? Authorizing Provider  alendronate (FOSAMAX) 70 MG tablet Take 70 mg by mouth once a week. 05/20/12  Yes [provider]  aspirin EC 81 MG tablet Take 81 mg by mouth 3 (three) times a week.   Yes [provider]  atorvastatin (LIPITOR) 40 MG tablet Take 40 mg by mouth daily.     Yes [provider]  ketorolac (ACULAR) 0.5 % ophthalmic solution Place 1 drop into both eyes 4 (four) times daily.     Yes [provider]  pantoprazole (PROTONIX) 40 MG tablet Take 1 tablet (40 mg total) by mouth daily. Patient taking differently: Take 40 mg by mouth daily as needed (heartburn).  03/04/15  Yes Robbie Lis, MD  prednisoLONE acetate (PRED FORTE) 1 % ophthalmic suspension Place 1 drop into both eyes 4 (four) times daily.     Yes [provider]  Vitamin D, Ergocalciferol, (DRISDOL) 50000 UNITS  CAPS capsule Take 50,000 Units by mouth every 30 (thirty) days.   Yes [provider]  Rivaroxaban (XARELTO STARTER PACK) 15 & 20 MG TBPK Take as directed on package: Start with one '15mg'$  tablet by mouth twice a day with food. On Day 22, switch to one '20mg'$  tablet once a day with food. Patient not taking: Reported on 09/08/2017 03/04/15   Robbie Lis, MD     Family History  Problem Relation Age of Onset  . Heart attack Mother   . Pneumonia Father   . Parkinsonism Father   . Breast cancer Sister     Social History   Socioeconomic History  . Marital status: Single    Spouse name: Not on file  . Number of children: Not on file  . Years of education: Not on file  . Highest education level: Not on file  Occupational History  . Not on file  Social Needs  . Financial resource strain: Not on file  . Food insecurity:    Worry: Not on file    Inability: Not on file  . Transportation needs:    Medical: Not on file    Non-medical: Not on file  Tobacco Use  . Smoking status: Never Smoker  . Smokeless tobacco: Former Systems developer    Types: Chew  Substance and Sexual Activity  . Alcohol use: No  . Drug use: No  . Sexual activity: Not on file  Lifestyle  . Physical activity:    Days per week: Not  on file    Minutes per session: Not on file  . Stress: Not on file  Relationships  . Social connections:    Talks on phone: Not on file    Gets together: Not on file    Attends religious service: Not on file    Active member of club or organization: Not on file    Attends meetings of clubs or organizations: Not on file    Relationship status: Not on file  Other Topics Concern  . Not on file  Social History Narrative  . Not on file     Review of Systems: A 12 point ROS discussed and pertinent positives are indicated in the HPI above.  All other systems are negative.  Review of Systems  Constitutional: Positive for fatigue. Negative for activity change and fever.       Positive  for generalized weakness.  Respiratory: Negative for shortness of breath and wheezing.   Cardiovascular: Negative for chest pain and palpitations.  Gastrointestinal: Negative for abdominal pain.  Neurological: Negative for dizziness.  Psychiatric/Behavioral: Negative for behavioral problems and confusion.    Vital Signs: BP 136/77   Pulse 74   Temp 99.1 F (37.3 C) (Oral)   Resp 20   Ht '5\' 4"'$  (1.626 m)   Wt 152 lb (68.9 kg)   SpO2 100%   BMI 26.09 kg/m   Physical Exam  Constitutional: She is oriented to person, place, and time. She appears well-developed and well-nourished. No distress.  Cardiovascular: Normal rate, regular rhythm and normal heart sounds.  No murmur heard. Pulmonary/Chest: Effort normal and breath sounds normal. No respiratory distress. She has no wheezes.  Neurological: She is alert and oriented to person, place, and time.  Skin: Skin is warm and dry.  Psychiatric: She has a normal mood and affect. Her behavior is normal. Judgment and thought content normal.  Nursing note and vitals reviewed.    MD Evaluation Airway: WNL Heart: WNL Abdomen: WNL Chest/ Lungs: WNL ASA  Classification: 3 Mallampati/Airway Score: One   Imaging: Dg Chest 2 View  Result Date: 09/20/2017 CLINICAL DATA:  Generalized weakness EXAM: CHEST - 2 VIEW COMPARISON:  03/02/2015 FINDINGS: The lungs are well inflated. The cardiomediastinal contours are normal. Hazy opacities in the right lung base are unchanged. There is no pleural effusion or pneumothorax. IMPRESSION: No active cardiopulmonary disease. Electronically Signed   By: Ulyses Jarred M.D.   On: 09/28/2017 15:25    Labs:  CBC: Recent Labs    09/09/2017 1425 09/08/2017 1919  WBC 2.0* 2.7*  HGB 8.3* 5.3*  HCT 25.6* 16.3*  PLT 52* 61*    COAGS: Recent Labs    09/17/2017 1919  INR 1.05    BMP: Recent Labs    09/26/2017 1425  NA 142  K 3.6  CL 107  CO2 26  GLUCOSE 116*  BUN 10  CALCIUM 8.4*  CREATININE 0.95    GFRNONAA 55*  GFRAA >60    LIVER FUNCTION TESTS: Recent Labs    09/23/2017 1425  BILITOT 0.8  AST 16  ALT 10*  ALKPHOS 66  PROT 7.4  ALBUMIN 3.5    TUMOR MARKERS: No results for input(s): AFPTM, CEA, CA199, CHROMGRNA in the last 8760 hours.  Assessment and Plan:  Pancytopenia. Plan for image-guided bone marrow biopsy and aspiration tomorrow with Dr. Annamaria Boots. Patient will be NPO at midnight tonight. Denies fever. She does not take blood thinners. INR 1.05 seconds 09/30/2017.  Patient states that she is legally blind,  but signs her own consent forms. Attempted to call her 3 children to make them aware of procedure, but was unable to reach them via phone.  Risks and benefits discussed with the patient including, but not limited to bleeding, infection, damage to adjacent structures or low yield requiring additional tests. All of the patient's questions were answered, patient is agreeable to proceed. Consent signed and in chart.  Thank you for this interesting consult.  I greatly enjoyed meeting Leah Lewis and look forward to participating in their care.  A copy of this report was sent to the requesting provider on this date.  Electronically Signed: Earley Abide, PA-C 09/06/2017, 10:22 AM   I spent a total of 20 Minutes in face to face in clinical consultation, greater than 50% of which was counseling/coordinating care for pancytopenia.

## 2017-09-06 NOTE — Care Management Obs Status (Signed)
[place] NOTIFICATION   Patient Details  Name: Leah Lewis MRN: [id number] Date of Birth: 09-Apr-1934   Medicare Observation Status Notification Given:  Yes  Pt unable to sign. Legally blind. Unable to get daughter on the phone. Copy left in the room.  Lynnell Catalan, RN 09/06/2017, 2:08 PM

## 2017-09-06 NOTE — Progress Notes (Signed)
PROGRESS NOTE [facility]   Leah Lewis   OIZ:[id number] DOB: 09-Jun-1934  DOA: 09/15/2017 PCP: Wenda Low, MD   Brief Narrative:  Leah Lewis  is a 83 y.o. female with medical history significant of hypertension, prior stroke hx of left DVT.  Patient presented to the emergency department complaining of generalized weakness and fatigue. Patient was supposed to be workup by oncology for anemia on June 3,2019, but patient was not able to make it to the office due to severe fatigue and weakness. Upon ED evaluation, patient was unable to walk across the room, hgb 8.3, plt 52 and WBC 2.0. Patient was admitted with working diagnosis of pancytopenia with symptomatic anemia.   Subjective: Patient seen and examined, feeling much better. No complaints today.  Denies chest pain, SOB and palpitations.  Assessment & Plan: Pancytopenia with symptomatic anemia  Unclear etiology, WBC 2.0, hemoglobin 8.3, platelet 51, - repeated Hbg 5.3 plt 61 Patient was transfused 3 units of PRBC's, with good response   Oncology consulted recs appreciated - Bone marrow biopsy on 6/5 LDH 239, normal iron, B12 borderline low. TSH normal. Normal renal function  Check CBC in AM  No signs of overt bleeding   Hypertension BP stable, not on medications  Monitor BP closely   Systolic murmur EKG sinus rhythm, ECHO pending  Chest x-ray with no cardiomegaly Continue to monitor  DVT prophylaxis: SCD's  Code Status: Full Code Family Communication: None at bedside  Disposition Plan: Home when cleared by oncology   Consultants:   Oncology   IR   Procedures:   None   Antimicrobials:  None    Objective: Vitals:   09/06/17 0419 09/06/17 0536 09/06/17 0658 09/06/17 0700  BP: (!) 124/53 127/67 136/77   Pulse: 68 74 74   Resp: (!) '24 15 20   '$ Temp: 98.2 F (36.8 C) 98.3 F (36.8 C) 99.1 F (37.3 C)   TempSrc: Axillary Oral Oral   SpO2: 99% 100% 100%   Weight:      Height:    '5\' 4"'$   (1.626 m)    Intake/Output Summary (Last 24 hours) at 09/06/2017 1255 Last data filed at 09/06/2017 0658 Gross per 24 hour  Intake 1766.25 ml  Output -  Net 1766.25 ml   Filed Weights   09/17/2017 1416  Weight: 68.9 kg (152 lb)    Examination:  General exam: Appears calm and comfortable  HEENT: AC/AT, PERRLA, OP moist and clear Respiratory system: Clear to auscultation. No wheezes,crackle or rhonchi Cardiovascular system: S1 & S2 heard, RRR. No JVD, murmurs, rubs or gallops Gastrointestinal system: Abdomen is nondistended, soft and nontender. No organomegaly or masses felt. Normal bowel sounds heard. Central nervous system: Alert and oriented. No focal neurological deficits. Extremities: No pedal edema. Symmetric, strength 5/5   Skin: No rashes, lesions or ulcers Psychiatry: Judgement and insight appear normal. Mood & affect appropriate.   Data Reviewed: I have personally reviewed following labs and imaging studies  CBC: Recent Labs  Lab 09/18/2017 1425 09/21/2017 1919 09/06/17 0948  WBC 2.0* 2.7*  --   NEUTROABS 0.6* 0.8*  --   HGB 8.3* 5.3* 11.8*  HCT 25.6* 16.3* 35.9*  MCV 99.2 99.4  --   PLT 52* 61*  --    Basic Metabolic Panel: Recent Labs  Lab 09/11/2017 1425  NA 142  K 3.6  CL 107  CO2 26  GLUCOSE 116*  BUN 10  CREATININE 0.95  CALCIUM 8.4*   GFR: Estimated Creatinine Clearance:  44.3 mL/min (by C-G formula based on SCr of 0.95 mg/dL). Liver Function Tests: Recent Labs  Lab 09/08/2017 1425  AST 16  ALT 10*  ALKPHOS 66  BILITOT 0.8  PROT 7.4  ALBUMIN 3.5   Recent Labs  Lab 09/24/2017 1425  LIPASE 22   No results for input(s): AMMONIA in the last 168 hours. Coagulation Profile: Recent Labs  Lab 09/12/2017 1919  INR 1.05   Cardiac Enzymes: No results for input(s): CKTOTAL, CKMB, CKMBINDEX, TROPONINI in the last 168 hours. BNP (last 3 results) No results for input(s): PROBNP in the last 8760 hours. HbA1C: No results for input(s): HGBA1C in the last  72 hours. CBG: No results for input(s): GLUCAP in the last 168 hours. Lipid Profile: No results for input(s): CHOL, HDL, LDLCALC, TRIG, CHOLHDL, LDLDIRECT in the last 72 hours. Thyroid Function Tests: Recent Labs    09/04/2017 1919  TSH 1.462   Anemia Panel: Recent Labs    09/24/2017 1425 09/11/2017 1639 09/06/17 0948  VITAMINB12  --  222  --   FOLATE  --  18.1  --   FERRITIN  --  646*  --   TIBC  --  227*  --   IRON  --  87  --   RETICCTPCT 1.0  --  0.7   Sepsis Labs: No results for input(s): PROCALCITON, LATICACIDVEN in the last 168 hours.  No results found for this or any previous visit (from the past 240 hour(s)).    Radiology Studies: Dg Chest 2 View  Result Date: 09/20/2017 CLINICAL DATA:  Generalized weakness EXAM: CHEST - 2 VIEW COMPARISON:  03/02/2015 FINDINGS: The lungs are well inflated. The cardiomediastinal contours are normal. Hazy opacities in the right lung base are unchanged. There is no pleural effusion or pneumothorax. IMPRESSION: No active cardiopulmonary disease. Electronically Signed   By: Ulyses Jarred M.D.   On: 09/29/2017 15:25      Scheduled Meds: . ketorolac  1 drop Both Eyes QID  . prednisoLONE acetate  1 drop Both Eyes QID   Continuous Infusions:   LOS: 0 days    Time spent: Total of 25 minutes spent with pt, greater than 50% of which was spent in discussion of  treatment, counseling and coordination of care   Chipper Oman, MD Pager: Text Page via [email or web address]   If 7PM-7AM, please contact night-coverage [email or web address] 09/06/2017, 12:55 PM   Note - This record has been created using [employer]. Chart creation errors have been sought, but may not always have been located. Such creation errors do not reflect on the standard of medical care.

## 2017-09-06 NOTE — Care Management Note (Signed)
Case Management Note  Patient Details  Name: Leah Lewis MRN: [id number] Date of Birth: 06/27/1934  Subjective/Objective:   83 yo admitted with Anemia                 Action/Plan: From home with daughter. Pt states her daughter doesn't help her much and she would like some additional personal assistance at home. We talked about her applying for Medicaid and maybe qualifying for CAPs services. This CM also went over PT eval recommendations and pt states she would like HHPT and [facility] aide. When choice was offered pt states she wants to talk to her daughter about it. Home Health provider list left in room with pt.  Expected Discharge Date:  (unknown)               Expected Discharge Plan:  [place]  In-House Referral:     Discharge planning Services  CM Consult  Post Acute Care Choice:  Home Health Choice offered to:  Patient, Adult Children  DME Arranged:    DME Agency:     HH Arranged:    [place] Agency:     Status of Service:  In process, will continue to follow  If discussed at Long Length of Stay Meetings, dates discussed:    Additional CommentsLynnell Catalan, RN 09/06/2017, 2:20 PM  [phone]

## 2017-09-07 ENCOUNTER — Inpatient Hospital Stay (HOSPITAL_COMMUNITY): Payer: Medicare Other

## 2017-09-07 ENCOUNTER — Encounter (HOSPITAL_COMMUNITY): Payer: Self-pay | Admitting: Radiology

## 2017-09-07 DIAGNOSIS — I361 Nonrheumatic tricuspid (valve) insufficiency: Secondary | ICD-10-CM

## 2017-09-07 DIAGNOSIS — I351 Nonrheumatic aortic (valve) insufficiency: Secondary | ICD-10-CM

## 2017-09-07 LAB — BPAM RBC
BLOOD PRODUCT EXPIRATION DATE: 201906252359
Blood Product Expiration Date: 201906252359
Blood Product Expiration Date: 201906252359
ISSUE DATE / TIME: 201906032132
ISSUE DATE / TIME: 201906040050
ISSUE DATE / TIME: 201906040359
Unit Type and Rh: 6200
Unit Type and Rh: 6200
Unit Type and Rh: 6200

## 2017-09-07 LAB — TYPE AND SCREEN
ABO/RH(D): A POS
Antibody Screen: NEGATIVE
UNIT DIVISION: 0
Unit division: 0
Unit division: 0

## 2017-09-07 LAB — CBC WITH DIFFERENTIAL/PLATELET
BASOS PCT: 0 %
BLASTS: 3 %
Basophils Absolute: 0 10*3/uL (ref 0.0–0.1)
EOS ABS: 0 10*3/uL (ref 0.0–0.7)
Eosinophils Relative: 0 %
HCT: 34.2 % — ABNORMAL LOW (ref 36.0–46.0)
Hemoglobin: 11.6 g/dL — ABNORMAL LOW (ref 12.0–15.0)
LYMPHS ABS: 1.4 10*3/uL (ref 0.7–4.0)
LYMPHS PCT: 50 %
MCH: 30.8 pg (ref 26.0–34.0)
MCHC: 33.9 g/dL (ref 30.0–36.0)
MCV: 90.7 fL (ref 78.0–100.0)
MONO ABS: 0.5 10*3/uL (ref 0.1–1.0)
Monocytes Relative: 18 %
NEUTROS ABS: 0.8 10*3/uL — AB (ref 1.7–7.7)
Neutrophils Relative %: 29 %
PLATELETS: 34 10*3/uL — AB (ref 150–400)
RBC: 3.77 MIL/uL — ABNORMAL LOW (ref 3.87–5.11)
RDW: 18.8 % — ABNORMAL HIGH (ref 11.5–15.5)
WBC: 2.8 10*3/uL — ABNORMAL LOW (ref 4.0–10.5)

## 2017-09-07 LAB — BASIC METABOLIC PANEL
Anion gap: 6 (ref 5–15)
BUN: 10 mg/dL (ref 6–20)
CALCIUM: 8.4 mg/dL — AB (ref 8.9–10.3)
CO2: 27 mmol/L (ref 22–32)
Chloride: 105 mmol/L (ref 101–111)
Creatinine, Ser: 0.97 mg/dL (ref 0.44–1.00)
GFR calc Af Amer: >60 mL/min (ref >60)
GFR, EST NON AFRICAN AMERICAN: 53 mL/min — AB (ref >60)
GLUCOSE: 131 mg/dL — AB (ref 65–99)
Potassium: 3.7 mmol/L (ref 3.5–5.1)
Sodium: 138 mmol/L (ref 135–145)

## 2017-09-07 LAB — ECHOCARDIOGRAM COMPLETE
HEIGHTINCHES: 64 in
WEIGHTICAEL: 2432 [oz_av]

## 2017-09-07 LAB — HIV ANTIBODY (ROUTINE TESTING W REFLEX): HIV Screen 4th Generation wRfx: NONREACTIVE

## 2017-09-07 LAB — ERYTHROPOIETIN: Erythropoietin: 212.4 m[IU]/mL — ABNORMAL HIGH (ref 2.6–18.5)

## 2017-09-07 MED ORDER — JUVEN PO PACK
1.0000 | PACK | Freq: Two times a day (BID) | ORAL | Status: DC
  Administered 2017-09-08 – 2017-09-26 (×25): 1 via ORAL
  Filled 2017-09-07 (×40): qty 1

## 2017-09-07 MED ORDER — MIDAZOLAM HCL 2 MG/2ML IJ SOLN
INTRAMUSCULAR | Status: AC | PRN
  Administered 2017-09-07: 0.5 mg via INTRAVENOUS

## 2017-09-07 MED ORDER — LIDOCAINE HCL 1 % IJ SOLN
INTRAMUSCULAR | Status: AC | PRN
  Administered 2017-09-07: 10 mL via INTRADERMAL

## 2017-09-07 MED ORDER — FENTANYL CITRATE (PF) 100 MCG/2ML IJ SOLN
INTRAMUSCULAR | Status: AC | PRN
  Administered 2017-09-07: 25 ug via INTRAVENOUS

## 2017-09-07 MED ORDER — FENTANYL CITRATE (PF) 100 MCG/2ML IJ SOLN
INTRAMUSCULAR | Status: AC
  Filled 2017-09-07: qty 4

## 2017-09-07 MED ORDER — KETOROLAC TROMETHAMINE 0.5 % OP SOLN
1.0000 [drp] | Freq: Four times a day (QID) | OPHTHALMIC | Status: DC
  Administered 2017-09-07 – 2017-10-11 (×121): 1 [drp] via OPHTHALMIC
  Filled 2017-09-07 (×3): qty 5

## 2017-09-07 MED ORDER — MIDAZOLAM HCL 2 MG/2ML IJ SOLN
INTRAMUSCULAR | Status: AC
  Filled 2017-09-07: qty 4

## 2017-09-07 NOTE — Procedures (Signed)
Leukemia  S/p RT ILIAC BM ASP AND CORE BX  No comp Stable EBL min Path pending Full report in pacs

## 2017-09-07 NOTE — Progress Notes (Signed)
  Echocardiogram 2D Echocardiogram has been performed.  Madelaine Etienne 09/07/2017, 12:05 PM

## 2017-09-07 NOTE — Progress Notes (Signed)
Initial Nutrition Assessment  INTERVENTION:   Provide Juven Fruit Punch BID, each serving provides 95kcal and 2.5g of protein (amino acids glutamine and arginine)  NUTRITION DIAGNOSIS:   Increased nutrient needs related to wound healing as evidenced by estimated needs.  GOAL:   Patient will meet greater than or equal to 90% of their needs  MONITOR:   PO intake, Supplement acceptance, Labs, Weight trends, Skin, I & O's  REASON FOR ASSESSMENT:   Malnutrition Screening Tool   ASSESSMENT:   83 y.o. female with medical history significant of hypertension, prior stroke hx of left DVT.  Patient presented to the emergency department complaining of generalized weakness and fatigue.   Patient currently NPO for bone marrow biopsy and aspiration. Pt with stage 2 pressure injurry on coccyx. Ate 75-100% of meals yesterday. Pt reported to oncology a weight loss of 30 lb over the past 2-3 months. Unable to confirm with weight records as last weight recorded was in 2016 (weight at that time was 160 lb).  Pt would benefit from nutritional supplementation for wound once diet is advanced. Will order at that time.  Labs reviewed. Medications reviewed.  NUTRITION - FOCUSED PHYSICAL EXAM:  Unable to perform, pt not in room.  Diet Order:   Diet Order           Diet NPO time specified Except for: Sips with Meds  Diet effective midnight          EDUCATION NEEDS:   Not appropriate for education at this time  Skin:  Skin Assessment: Skin Integrity Issues: Skin Integrity Issues:: Stage II Stage II: coccyx  Last BM:  6/4  Height:   Ht Readings from Last 1 Encounters:  09/06/17 _0  (1.626 m)    Weight:   Wt Readings from Last 1 Encounters:  09/10/2017 152 lb (68.9 kg)    Ideal Body Weight:  54.5 kg  BMI:  Body mass index is 26.09 kg/m.  Estimated Nutritional Needs:   Kcal:  1550-1750  Protein:  60-70g  Fluid:  1.7L/day  Clayton Bibles, MS, RD, LDN [place] Dietitian Pager: [phone] After Hours Pager: [phone]

## 2017-09-07 NOTE — Sedation Documentation (Signed)
Called primary RN to make aware of open pressure ulcer on sacrum. Rn stated she was made aware by night shift. The ulcer was open to room air when arrived in CT

## 2017-09-07 NOTE — Progress Notes (Signed)
Ms. Martinique is sitting up in a chair today.  She seems to be doing a little bit better.  The transfusion seem to help her out quite a bit.  I think she is set up for a bone marrow test today.  Hopefully, we might get a preliminary report on this tomorrow.  Her labs show that she responded well to the transfusion.  Hemoglobin went from 5.3 up to 11.6.  She still has leukopenia and thrombocytopenia.  Her erythropoietin level is somewhat high at 212.  She is eating okay.  She has had no nausea or vomiting.  She has not noted any bleeding.  Again, we are really in a "holding pattern" until we get the bone marrow report back.  She either will have an acute leukemic picture or myelodysplastic process.  I would think that hairy cell leukemia would be unlikely but yet would be the best possibility that I can think of.  Her daughter was with [place].  Her daughter is a Company secretary.  We had a very good prayer session this morning.  Of note, there is a comment of blasts being seen on the blood smear by the technologist.  This is clearly would be an indicator as to what is going on.  Of note, her reticulocyte count is nonexistent.  When corrected, her reticulocyte count is about 0.2.  Hopefully, there will be a pulmonary port tomorrow.  If she does have acute leukemia, she will need to be referred to [facility] as they have a outstanding leukemic program.  She seems to be in pretty good shape so she how she might be able to tolerate some degree of aggressiveness with an induction program.  If [facility] doctors do not think that she is a candidate for aggressive intervention, then we probably would treat her here with decitabine with venetoclax.  [name], MD  [place] [phone]

## 2017-09-07 NOTE — Progress Notes (Signed)
PROGRESS NOTE    Leah Lewis  YKD:[id number] DOB: February 01, 1935 DOA: 09/07/2017 PCP: Wenda Low, MD   Brief Narrative:  Leah Lewis is a 83 y.o.femalewith medical history significant ofhypertension, prior stroke,hx ofleft DVT.Patient presented to the emergency department complaining of generalized weakness and fatigue. Patient was supposed to be worked up by oncology for anemia on September 05, 2017, but patient was not able to make it to the office due to severe fatigue and weakness. Upon ED evaluation, patient was unable to walk across the room, hgb 8.3, plt 52 and WBC 2.0. Patient was admitted with working diagnosis of pancytopenia with symptomatic anemia.   New events last 24 hours / Subjective: No acute events overnight, underwent bone marrow biopsy without issue.  Assessment & Plan:   Active Problems:   Anemia   Pressure injury of skin  Pancytopenia with symptomatic anemia  Unclear etiology Patient was transfused 3 units of packed RBC  Oncology consulted recs appreciated  S/p bone marrow biopsy on 6/5 Trend CBC  Hypertension BP stable, not on medications   Systolic murmur Echo pending     DVT prophylaxis: SCDs Code Status: Full Family Communication: No family at bedside Disposition Plan: Pending bone marrow biopsy result   Consultants:   Oncology  Procedures:   Bone marrow biopsy 6/5  Antimicrobials:  Anti-infectives (From admission, onward)   None        Objective: Vitals:   09/07/17 1238 09/07/17 1252 09/07/17 1305 09/07/17 1337  BP: 123/68 134/62 111/63 116/62  Pulse: 72 73 69 79  Resp: _0 Temp: 98.7 F (37.1 C) 99.1 F (37.3 C) 99.4 F (37.4 C) 97.8 F (36.6 C)  TempSrc: Oral Oral Oral Oral  SpO2: 97% 99% 96% 98%  Weight:      Height:        Intake/Output Summary (Last 24 hours) at 09/07/2017 1350 Last data filed at 09/06/2017 1914 Gross per 24 hour  Intake 400 ml  Output -  Net 400 ml   Filed Weights   09/12/2017 1416  Weight: 68.9 kg (152 lb)    Examination:  General exam: Appears calm and comfortable  Respiratory system: Clear to auscultation. Respiratory effort normal. Cardiovascular system: S1 & S2 heard, RRR. +systolic murmur RSB  Gastrointestinal system: Abdomen is nondistended, soft and nontender. No organomegaly or masses felt. Normal bowel sounds heard. Central nervous system: Alert and oriented. No focal neurological deficits. Extremities: Symmetric 5 x 5 power. Skin: No rashes, lesions or ulcers Psychiatry: Judgement and insight appear normal. Mood & affect appropriate.   Data Reviewed: I have personally reviewed following labs and imaging studies  CBC: Recent Labs  Lab 09/10/2017 1425 09/04/2017 1919 09/06/17 0948 09/07/17 0340  WBC 2.0* 2.7*  --  2.8*  NEUTROABS 0.6* 0.8*  --  0.8*  HGB 8.3* 5.3* 11.8* 11.6*  HCT 25.6* 16.3* 35.9* 34.2*  MCV 99.2 99.4  --  90.7  PLT 52* 61* 36* 34*   Basic Metabolic Panel: Recent Labs  Lab 09/16/2017 1425 09/07/17 0340  NA 142 138  K 3.6 3.7  CL 107 105  CO2 26 27  GLUCOSE 116* 131*  BUN 10 10  CREATININE 0.95 0.97  CALCIUM 8.4* 8.4*   GFR: Estimated Creatinine Clearance: 43.4 mL/min (by C-G formula based on SCr of 0.97 mg/dL). Liver Function Tests: Recent Labs  Lab 10/02/2017 1425  AST 16  ALT 10*  ALKPHOS 66  BILITOT 0.8  PROT 7.4  ALBUMIN 3.5  Recent Labs  Lab 09/17/2017 1425  LIPASE 22   No results for input(s): AMMONIA in the last 168 hours. Coagulation Profile: Recent Labs  Lab 09/23/2017 1919  INR 1.05   Cardiac Enzymes: No results for input(s): CKTOTAL, CKMB, CKMBINDEX, TROPONINI in the last 168 hours. BNP (last 3 results) No results for input(s): PROBNP in the last 8760 hours. HbA1C: No results for input(s): HGBA1C in the last 72 hours. CBG: No results for input(s): GLUCAP in the last 168 hours. Lipid Profile: No results for input(s): CHOL, HDL, LDLCALC, TRIG, CHOLHDL, LDLDIRECT in the last 72  hours. Thyroid Function Tests: Recent Labs    09/09/2017 1919  TSH 1.462   Anemia Panel: Recent Labs    09/17/2017 1425 09/15/2017 1639 09/06/17 0948  VITAMINB12  --  222  --   FOLATE  --  18.1  --   FERRITIN  --  646*  --   TIBC  --  227*  --   IRON  --  87  --   RETICCTPCT 1.0  --  0.7   Sepsis Labs: No results for input(s): PROCALCITON, LATICACIDVEN in the last 168 hours.  No results found for this or any previous visit (from the past 240 hour(s)).     Radiology Studies: Dg Chest 2 View  Result Date: 09/21/2017 CLINICAL DATA:  Generalized weakness EXAM: CHEST - 2 VIEW COMPARISON:  03/02/2015 FINDINGS: The lungs are well inflated. The cardiomediastinal contours are normal. Hazy opacities in the right lung base are unchanged. There is no pleural effusion or pneumothorax. IMPRESSION: No active cardiopulmonary disease. Electronically Signed   By: Ulyses Jarred M.D.   On: 09/04/2017 15:25   Ct Biopsy  Result Date: 09/07/2017 INDICATION: Lymphoma versus leukemia, pancytopenia EXAM: CT GUIDED RIGHT ILIAC BONE MARROW ASPIRATION AND CORE BIOPSY Date:  09/07/2017 09/07/2017 12:00 pm Radiologist:  Jerilynn Mages. Daryll Brod, MD Guidance:  CT FLUOROSCOPY TIME:  Fluoroscopy Time: None. MEDICATIONS: 1% lidocaine local ANESTHESIA/SEDATION: 0.5 mg IV Versed; 25 mcg IV Fentanyl Moderate Sedation Time:  10 minutes The patient was continuously monitored during the procedure by the interventional radiology nurse under my direct supervision. CONTRAST:  None. COMPLICATIONS: None PROCEDURE: Informed consent was obtained from the patient following explanation of the procedure, risks, benefits and alternatives. The patient understands, agrees and consents for the procedure. All questions were addressed. A time out was performed. The patient was positioned prone and non-contrast localization CT was performed of the pelvis to demonstrate the iliac marrow spaces. Maximal barrier sterile technique utilized including caps, mask,  sterile gowns, sterile gloves, large sterile drape, hand hygiene, and Betadine prep. Under sterile conditions and local anesthesia, an 11 gauge coaxial bone biopsy needle was advanced into the right iliac marrow space. Needle position was confirmed with CT imaging. Initially, bone marrow aspiration was performed. Next, the 11 gauge outer cannula was utilized to obtain a right iliac bone marrow core biopsy. Needle was removed. Hemostasis was obtained with compression. The patient tolerated the procedure well. Samples were prepared with the cytotechnologist. No immediate complications. IMPRESSION: CT guided right iliac bone marrow aspiration and core biopsy. Electronically Signed   By: Jerilynn Mages.  Shick M.D.   On: 09/07/2017 12:23   Ct Bone Marrow Biopsy & Aspiration  Result Date: 09/07/2017 INDICATION: Lymphoma versus leukemia, pancytopenia EXAM: CT GUIDED RIGHT ILIAC BONE MARROW ASPIRATION AND CORE BIOPSY Date:  09/07/2017 09/07/2017 12:00 pm Radiologist:  Jerilynn Mages. Daryll Brod, MD Guidance:  CT FLUOROSCOPY TIME:  Fluoroscopy Time: None. MEDICATIONS: 1% lidocaine local  ANESTHESIA/SEDATION: 0.5 mg IV Versed; 25 mcg IV Fentanyl Moderate Sedation Time:  10 minutes The patient was continuously monitored during the procedure by the interventional radiology nurse under my direct supervision. CONTRAST:  None. COMPLICATIONS: None PROCEDURE: Informed consent was obtained from the patient following explanation of the procedure, risks, benefits and alternatives. The patient understands, agrees and consents for the procedure. All questions were addressed. A time out was performed. The patient was positioned prone and non-contrast localization CT was performed of the pelvis to demonstrate the iliac marrow spaces. Maximal barrier sterile technique utilized including caps, mask, sterile gowns, sterile gloves, large sterile drape, hand hygiene, and Betadine prep. Under sterile conditions and local anesthesia, an 11 gauge coaxial bone biopsy  needle was advanced into the right iliac marrow space. Needle position was confirmed with CT imaging. Initially, bone marrow aspiration was performed. Next, the 11 gauge outer cannula was utilized to obtain a right iliac bone marrow core biopsy. Needle was removed. Hemostasis was obtained with compression. The patient tolerated the procedure well. Samples were prepared with the cytotechnologist. No immediate complications. IMPRESSION: CT guided right iliac bone marrow aspiration and core biopsy. Electronically Signed   By: Jerilynn Mages.  Shick M.D.   On: 09/07/2017 12:23      Scheduled Meds: . fentaNYL      . ketorolac  1 drop Both Eyes QID  . midazolam      . nutrition supplement (JUVEN)  1 packet Oral BID BM  . prednisoLONE acetate  1 drop Both Eyes QID   Continuous Infusions:   LOS: 1 day    Time spent: 25 minutes   Dessa Phi, DO [facility] [email or web address] Password [email or web address] 09/07/2017, 1:50 PM

## 2017-09-08 LAB — CBC
HEMATOCRIT: 35.8 % — AB (ref 36.0–46.0)
HEMOGLOBIN: 11.6 g/dL — AB (ref 12.0–15.0)
MCH: 30.1 pg (ref 26.0–34.0)
MCHC: 32.4 g/dL (ref 30.0–36.0)
MCV: 93 fL (ref 78.0–100.0)
Platelets: 36 10*3/uL — ABNORMAL LOW (ref 150–400)
RBC: 3.85 MIL/uL — AB (ref 3.87–5.11)
RDW: 18.4 % — ABNORMAL HIGH (ref 11.5–15.5)
WBC: 2.8 10*3/uL — AB (ref 4.0–10.5)

## 2017-09-08 MED ORDER — SODIUM CHLORIDE 0.9 % IV BOLUS
500.0000 mL | Freq: Once | INTRAVENOUS | Status: AC
  Administered 2017-09-08: 500 mL via INTRAVENOUS

## 2017-09-08 MED ORDER — POLYETHYLENE GLYCOL 3350 17 G PO PACK: 17.0000 g | PACK | Freq: Every day | ORAL | Status: DC | PRN

## 2017-09-08 NOTE — Progress Notes (Signed)
Patient ID: Leah Lewis, female   DOB: 1934/04/09, 83 y.o.   MRN: [id number]                                                                PROGRESS NOTE                                                                                                                                                                                                             Patient Demographics:    Leah Lewis, is a 83 y.o. female, DOB - November 05, 1934, OEV:[id number]  Admit date - 09/04/2017   Admitting Physician Doreatha Lew, MD  Outpatient Primary MD for the patient is Wenda Low, MD  LOS - 2  Outpatient Specialists:    Chief Complaint  Patient presents with  . Fatigue       Brief Narrative  83 y.o.femalewith medical history significant ofhypertension, prior stroke,hx ofleft DVT.Patient presented to the emergency department complaining of generalized weakness and fatigue.Patient was supposed to be worked up by oncology for anemia on September 05, 2017, but patient was not able to make it to the office due to severe fatigue and weakness. Upon ED evaluation, patient was unable to walk across the room, hgb 8.3, plt 52 and WBC 2.0. Patient was admitted with working diagnosis of pancytopenia with symptomatic anemia.      Subjective:    [name] today is feeling better.  No bleeding.  No blood in stool.  Awaiting biopsy results.  + constipation.   No headache, No chest pain, No abdominal pain - No Nausea, No new weakness tingling or numbness, No Cough - SOB.    Assessment  & Plan :    Principal Problem:   Pancytopenia ([place]) Active Problems:   Pressure injury of skin   Pancytopenia with symptomatic anemia Unclear etiology Patient was transfused 3 units of packed RBC  Oncology consulted recs appreciated  S/p bone marrow biopsy on 6/5 Trend CBC  Hypertension BP stable, not on medications   Systolic murmur Echo 8/2=> EF 60-65%, aortic sclerosis  Constipation miralax       DVT prophylaxis: SCDs Code Status: Full Family Communication: No family at bedside Disposition Plan: Pending bone marrow biopsy result   Consultants:   Oncology  Procedures:   Bone marrow biopsy  6/5  Antimicrobials:       Anti-infectives (From admission, onward)   None        Objective:   Vitals:   09/07/17 2251 09/08/17 0121 09/08/17 0354 09/08/17 0611  BP:    130/62  Pulse:    76  Resp:    15  Temp: (!) 102.9 F (39.4 C) (!) 101.3 F (38.5 C) 98.9 F (37.2 C) 98.6 F (37 C)  TempSrc: Oral Oral Oral Oral  SpO2:    94%  Weight:      Height:        Wt Readings from Last 3 Encounters:  09/07/2017 68.9 kg (152 lb)  03/02/15 72.6 kg (160 lb)  04/20/11 76 kg (167 lb 8.8 oz)     Intake/Output Summary (Last 24 hours) at 09/08/2017 0801 Last data filed at 09/08/2017 0617 Gross per 24 hour  Intake 720 ml  Output 1500 ml  Net -780 ml     Physical Exam  Awake Alert, Oriented X 3, No new F.N deficits, Normal affect [place].AT,PERRAL Supple Neck,No JVD, No cervical lymphadenopathy appriciated.  Symmetrical Chest wall movement, Good air movement bilaterally, CTAB RRR,No Gallops,Rubs or new Murmurs, No Parasternal Heave +ve B.Sounds, Abd Soft, No tenderness, No organomegaly appriciated, No rebound - guarding or rigidity. No Cyanosis, Clubbing or edema, No new Rash or bruise     Data Review:    CBC Recent Labs  Lab 09/23/2017 1425 09/18/2017 1919 09/06/17 0948 09/07/17 0340 09/08/17 0400  WBC 2.0* 2.7*  --  2.8* 2.8*  HGB 8.3* 5.3* 11.8* 11.6* 11.6*  HCT 25.6* 16.3* 35.9* 34.2* 35.8*  PLT 52* 61* 36* 34* 36*  MCV 99.2 99.4  --  90.7 93.0  MCH 32.2 32.3  --  30.8 30.1  MCHC 32.4 32.5  --  33.9 32.4  RDW 15.3 15.5  --  18.8* 18.4*  LYMPHSABS 1.0 1.4  --  1.4  --   MONOABS 0.4 0.5  --  0.5  --   EOSABS 0.0 0.0  --  0.0  --   BASOSABS 0.0 0.0  --  0.0  --     Chemistries  Recent Labs  Lab 09/03/2017 1425 09/07/17 0340  NA 142 138  K 3.6 3.7   CL 107 105  CO2 26 27  GLUCOSE 116* 131*  BUN 10 10  CREATININE 0.95 0.97  CALCIUM 8.4* 8.4*  AST 16  --   ALT 10*  --   ALKPHOS 66  --   BILITOT 0.8  --    ------------------------------------------------------------------------------------------------------------------ No results for input(s): CHOL, HDL, LDLCALC, TRIG, CHOLHDL, LDLDIRECT in the last 72 hours.  Lab Results  Component Value Date   HGBA1C (H) 04/03/2007    6.2 (NOTE)   The ADA recommends the following therapeutic goals for glycemic   control related to Hgb A1C measurement:   Goal of Therapy:   < 7.0% Hgb A1C   Action Suggested:  > 8.0% Hgb A1C   Ref:  Diabetes Care, 22, Suppl. 1, 1999   ------------------------------------------------------------------------------------------------------------------ Recent Labs    09/03/2017 1919  TSH 1.462   ------------------------------------------------------------------------------------------------------------------ Recent Labs    09/12/2017 1425 09/20/2017 1639 09/06/17 0948  VITAMINB12  --  222  --   FOLATE  --  18.1  --   FERRITIN  --  646*  --   TIBC  --  227*  --   IRON  --  87  --   RETICCTPCT 1.0  --  0.7  Coagulation profile Recent Labs  Lab 09/26/2017 1919  INR 1.05    No results for input(s): DDIMER in the last 72 hours.  Cardiac Enzymes No results for input(s): CKMB, TROPONINI, MYOGLOBIN in the last 168 hours.  Invalid input(s): CK ------------------------------------------------------------------------------------------------------------------ No results found for: BNP  Inpatient Medications  Scheduled Meds: . ketorolac  1 drop Both Eyes QID  . nutrition supplement (JUVEN)  1 packet Oral BID BM  . prednisoLONE acetate  1 drop Both Eyes QID   Continuous Infusions: PRN Meds:.acetaminophen **OR** acetaminophen  Micro Results Recent Results (from the past 240 hour(s))  Culture, blood (Routine X 2) w Reflex to ID Panel     Status: None  (Preliminary result)   Collection Time: 09/11/2017  7:19 PM  Result Value Ref Range Status   Specimen Description   Final    BLOOD RIGHT HAND Performed at [facility] [address], [place], [place] [address]    Special Requests   Final    BOTTLES DRAWN AEROBIC ONLY Blood Culture adequate volume Performed at [facility] [address], [place], [place] [address]    Culture   Final    NO GROWTH 1 DAY Performed at [facility], [place] [address] [place], [name] [address]    Report Status PENDING  Incomplete  Culture, blood (Routine X 2) w Reflex to ID Panel     Status: None (Preliminary result)   Collection Time: 09/19/2017  7:19 PM  Result Value Ref Range Status   Specimen Description   Final    BLOOD LEFT HAND Performed at [facility] [address] [place], [place] [address]    Special Requests   Final    BOTTLES DRAWN AEROBIC ONLY Blood Culture adequate volume Performed at [facility] [address], [place], [place] [address]    Culture   Final    NO GROWTH 1 DAY Performed at [facility], [place] [address], [place], [place] [address]    Report Status PENDING  Incomplete    Radiology Reports Dg Chest 2 View  Result Date: 09/14/2017 CLINICAL DATA:  Generalized weakness EXAM: CHEST - 2 VIEW COMPARISON:  03/02/2015 FINDINGS: The lungs are well inflated. The cardiomediastinal contours are normal. Hazy opacities in the right lung base are unchanged. There is no pleural effusion or pneumothorax. IMPRESSION: No active cardiopulmonary disease. Electronically Signed   By: Ulyses Jarred M.D.   On: 09/13/2017 15:25   Ct Biopsy  Result Date: 09/07/2017 INDICATION: Lymphoma versus leukemia, pancytopenia EXAM: CT GUIDED RIGHT ILIAC BONE MARROW ASPIRATION AND CORE BIOPSY Date:  09/07/2017 09/07/2017 12:00 pm Radiologist:  Jerilynn Mages. Daryll Brod, MD Guidance:  CT FLUOROSCOPY TIME:  Fluoroscopy Time: None.  MEDICATIONS: 1% lidocaine local ANESTHESIA/SEDATION: 0.5 mg IV Versed; 25 mcg IV Fentanyl Moderate Sedation Time:  10 minutes The patient was continuously monitored during the procedure by the interventional radiology nurse under my direct supervision. CONTRAST:  None. COMPLICATIONS: None PROCEDURE: Informed consent was obtained from the patient following explanation of the procedure, risks, benefits and alternatives. The patient understands, agrees and consents for the procedure. All questions were addressed. A time out was performed. The patient was positioned prone and non-contrast localization CT was performed of the pelvis to demonstrate the iliac marrow spaces. Maximal barrier sterile technique utilized including caps, mask, sterile gowns, sterile gloves, large sterile drape, hand hygiene, and Betadine prep. Under sterile conditions and local anesthesia, an 11 gauge coaxial bone  biopsy needle was advanced into the right iliac marrow space. Needle position was confirmed with CT imaging. Initially, bone marrow aspiration was performed. Next, the 11 gauge outer cannula was utilized to obtain a right iliac bone marrow core biopsy. Needle was removed. Hemostasis was obtained with compression. The patient tolerated the procedure well. Samples were prepared with the cytotechnologist. No immediate complications. IMPRESSION: CT guided right iliac bone marrow aspiration and core biopsy. Electronically Signed   By: Jerilynn Mages.  Shick M.D.   On: 09/07/2017 12:23   Ct Bone Marrow Biopsy & Aspiration  Result Date: 09/07/2017 INDICATION: Lymphoma versus leukemia, pancytopenia EXAM: CT GUIDED RIGHT ILIAC BONE MARROW ASPIRATION AND CORE BIOPSY Date:  09/07/2017 09/07/2017 12:00 pm Radiologist:  Jerilynn Mages. Daryll Brod, MD Guidance:  CT FLUOROSCOPY TIME:  Fluoroscopy Time: None. MEDICATIONS: 1% lidocaine local ANESTHESIA/SEDATION: 0.5 mg IV Versed; 25 mcg IV Fentanyl Moderate Sedation Time:  10 minutes The patient was continuously monitored  during the procedure by the interventional radiology nurse under my direct supervision. CONTRAST:  None. COMPLICATIONS: None PROCEDURE: Informed consent was obtained from the patient following explanation of the procedure, risks, benefits and alternatives. The patient understands, agrees and consents for the procedure. All questions were addressed. A time out was performed. The patient was positioned prone and non-contrast localization CT was performed of the pelvis to demonstrate the iliac marrow spaces. Maximal barrier sterile technique utilized including caps, mask, sterile gowns, sterile gloves, large sterile drape, hand hygiene, and Betadine prep. Under sterile conditions and local anesthesia, an 11 gauge coaxial bone biopsy needle was advanced into the right iliac marrow space. Needle position was confirmed with CT imaging. Initially, bone marrow aspiration was performed. Next, the 11 gauge outer cannula was utilized to obtain a right iliac bone marrow core biopsy. Needle was removed. Hemostasis was obtained with compression. The patient tolerated the procedure well. Samples were prepared with the cytotechnologist. No immediate complications. IMPRESSION: CT guided right iliac bone marrow aspiration and core biopsy. Electronically Signed   By: Jerilynn Mages.  Shick M.D.   On: 09/07/2017 12:23    Time Spent in minutes  30   Jani Gravel M.D on 09/08/2017 at 8:01 AM  Between 7am to 7pm - Pager - [phone]    After 7pm go to [email or web address] - password [facility]  [facility] -  Office  [phone]

## 2017-09-08 NOTE — Progress Notes (Signed)
Leah Lewis had her bone marrow test yesterday.  Everything went well.  Hopefully, there will be as some preliminary report today as to what might be going on.  She does not feel as "perky" today.  Her labs look stable.  Her white cell count is 2.8.  Her hemoglobin is 11.6.  Platelet count 36,000.  Again, yesterday, there is some blasts that we saw on the blood smear.  This might give [place] an idea as to what the bone marrow will show.  She has had no fever.  She has had no bleeding.  There is been no cough.  She is had no rashes.  She has had no leg swelling.  On her physical exam, her temperature is 98.6.  Pulse 76.  Blood pressure 130/62.  There is really no change in her overall physical exam.  I am confident that the bone marrow biopsy will show [place] what is going on.  Hopefully I will hear something today.  She is looking good.  Her blood counts are stable.  Hopefully, she may be to be discharged to home in a day or so.  I know that there was a high temperature yesterday.  I am not sure what that was all about.  I do not know if she had cultures taken.  [name], MD  Job 19:25

## 2017-09-08 NOTE — Progress Notes (Signed)
Physical Therapy Treatment Patient Details Name: Leah Lewis MRN: [id number] DOB: Aug 25, 1934 Today's Date: 09/08/2017    History of Present Illness 83 y.o. female with medical history significant of hypertension, prior stroke hx of left DVT.  Patient presented to the emergency department complaining of generalized weakness and fatigue. Dx of anemia. s/p transfusion of 3 units of blood.     PT Comments    Pt admitted with above diagnosis. Pt currently with functional limitations due to the deficits listed below (see PT Problem List). Patient did not perform as well today; not feeling as well today. Decreased endurance for ambulation from 100 ft to 40 ft; performed therapeutic exercises without complaint. Pt will benefit from continued skilled PT to increase their independence and safety with mobility to allow discharge to the venue listed below.     Follow Up Recommendations  Home health PT     Equipment Recommendations  None recommended by PT    Recommendations for Other Services       Precautions / Restrictions Precautions Precautions: Fall;Other (comment) Precaution Comments: reports no h/o falls in past 1 year; pt is legally blind but can see objects Restrictions Weight Bearing Restrictions: No    Mobility  Bed Mobility Overal bed mobility: Modified Independent             General bed mobility comments: with rail, increased time  Transfers Overall transfer level: Needs assistance Equipment used: Rolling walker (2 wheeled) Transfers: Sit to/from [facility] Sit to Stand: Min assist Stand pivot transfers: Min guard       General transfer comment: min/guard for safety; cues for hand placement  Ambulation/Gait Ambulation/Gait assistance: Min guard Ambulation Distance (Feet): 40 Feet Assistive device: Rolling walker (2 wheeled) Gait Pattern/deviations: Step-through pattern;Decreased stride length     General Gait Details: steady, no LOB,  distance limited by fatigue   Stairs             Wheelchair Mobility    Modified Rankin (Stroke Patients Only)       Balance Overall balance assessment: Modified Independent                                          Cognition Arousal/Alertness: Awake/alert Behavior During Therapy: WFL for tasks assessed/performed Overall Cognitive Status: Within Functional Limits for tasks assessed                                        Exercises General Exercises - Upper Extremity Shoulder Flexion: AROM;Strengthening;10 reps General Exercises - Lower Extremity Gluteal Sets: Strengthening;Both;10 reps;Seated Long Arc Quad: Strengthening;Both;10 reps;Seated Hip Flexion/Marching: Strengthening;Both;10 reps;Seated Toe Raises: Strengthening;Both;10 reps;Seated Heel Raises: Strengthening;Both;10 reps;Seated    General Comments        Pertinent Vitals/Pain Pain Assessment: No/denies pain    Home Living                      Prior Function            PT Goals (current goals can now be found in the care plan section) Progress towards PT goals: Not progressing toward goals - comment(patient not feeling as well today)    Frequency    Min 3X/week      PT Plan Current plan remains appropriate  Co-evaluation              AM-PAC PT "6 Clicks" Daily Activity  Outcome Measure  Difficulty turning over in bed (including adjusting bedclothes, sheets and blankets)?: None Difficulty moving from lying on back to sitting on the side of the bed? : A Little Difficulty sitting down on and standing up from a chair with arms (e.g., wheelchair, bedside commode, etc,.)?: A Little Help needed moving to and from a bed to chair (including a wheelchair)?: A Little Help needed walking in hospital room?: A Little Help needed climbing 3-5 steps with a railing? : A Lot 6 Click Score: 18    End of Session Equipment Utilized During Treatment:  Gait belt Activity Tolerance: Patient limited by fatigue Patient left: in chair;with call bell/phone within reach;with chair alarm set Nurse Communication: Mobility status PT Visit Diagnosis: Difficulty in walking, not elsewhere classified (R26.2)     Time: 4818-5631 PT Time Calculation (min) (ACUTE ONLY): 28 min  Charges:  $Gait Training: 8-22 mins $Therapeutic Exercise: 8-22 mins                    G Codes:       Sydney Hasten D. Hartnett-Rands, MS, PT Per [place] [phone] 09/08/2017, 2:45 PM

## 2017-09-09 LAB — COMPREHENSIVE METABOLIC PANEL
ALK PHOS: 56 U/L (ref 38–126)
ALT: 11 U/L — AB (ref 14–54)
AST: 20 U/L (ref 15–41)
Albumin: 3.2 g/dL — ABNORMAL LOW (ref 3.5–5.0)
Anion gap: 7 (ref 5–15)
BILIRUBIN TOTAL: 1.1 mg/dL (ref 0.3–1.2)
BUN: 18 mg/dL (ref 6–20)
CHLORIDE: 103 mmol/L (ref 101–111)
CO2: 30 mmol/L (ref 22–32)
CREATININE: 1.07 mg/dL — AB (ref 0.44–1.00)
Calcium: 8.6 mg/dL — ABNORMAL LOW (ref 8.9–10.3)
GFR calc Af Amer: 55 mL/min — ABNORMAL LOW (ref >60)
GFR, EST NON AFRICAN AMERICAN: 47 mL/min — AB (ref >60)
Glucose, Bld: 119 mg/dL — ABNORMAL HIGH (ref 65–99)
Potassium: 4.2 mmol/L (ref 3.5–5.1)
Sodium: 140 mmol/L (ref 135–145)
Total Protein: 7.5 g/dL (ref 6.5–8.1)

## 2017-09-09 LAB — CBC
HEMATOCRIT: 37.5 % (ref 36.0–46.0)
HEMOGLOBIN: 12.1 g/dL (ref 12.0–15.0)
MCH: 30.4 pg (ref 26.0–34.0)
MCHC: 32.3 g/dL (ref 30.0–36.0)
MCV: 94.2 fL (ref 78.0–100.0)
Platelets: 30 10*3/uL — ABNORMAL LOW (ref 150–400)
RBC: 3.98 MIL/uL (ref 3.87–5.11)
RDW: 18 % — ABNORMAL HIGH (ref 11.5–15.5)
WBC: 3.4 10*3/uL — AB (ref 4.0–10.5)

## 2017-09-09 MED ORDER — LACTULOSE 10 GM/15ML PO SOLN
30.0000 g | Freq: Once | ORAL | Status: AC
  Administered 2017-09-09: 30 g via ORAL
  Filled 2017-09-09: qty 45

## 2017-09-09 MED ORDER — POLYETHYLENE GLYCOL 3350 17 G PO PACK
17.0000 g | PACK | Freq: Two times a day (BID) | ORAL | Status: DC
  Administered 2017-09-09 – 2017-10-07 (×30): 17 g via ORAL
  Filled 2017-09-09 (×44): qty 1

## 2017-09-09 NOTE — Progress Notes (Signed)
So far, the bone report is not yet out.  I would hope that I will be out today.  She is a little constipated.  This might be causing some of her decreased appetite.  I will increase her MiraLAX to twice a day dosing.  I will give her 1 dose of lactulose.  She is had no bleeding.  She says that she has had a little bit of a temperature.  This morning, her temperature is 99.4.  Her labs are really stable.  Her white cell count is 3.4.  Hemoglobin 12.1.  Platelet count 30,000.  Her electrolytes look okay.  Again, the key will have to be the pulmonary results.  I would like to think that that will be back today.  Hopefully a preliminary will at least be back.  It is not surprising that this would be acute leukemia.  Myelodysplasia would be the other possibility that I would think likely.  If she does have acute leukemia, then I would consider transferring her to [facility] for an evaluation there.  If they do not feel that they can help her out or that she would not need any inpatient therapy, and I would consider using decitabine and venetoclax.  I would also make sure that the leukemia cells are checked for mutation (i.e. FLT3, IDH1/IDH2).  As always, we had a very good prayer session.  Her daughter was with [place] this morning.  [name], MD  Acts:31:16

## 2017-09-09 NOTE — Progress Notes (Signed)
Patient ID: Leah Lewis, female   DOB: 05-Mar-1935, 83 y.o.   MRN: [id number]                                                                PROGRESS NOTE                                                                                                                                                                                                             Patient Demographics:    Leah Lewis, is a 83 y.o. female, DOB - [birth year], XNT:[id number]  Admit date - 09/22/2017   Admitting Physician Doreatha Lew, MD  Outpatient Primary MD for the patient is Wenda Low, MD  LOS - 3  Outpatient Specialists:    Chief Complaint  Patient presents with  . Fatigue       Brief Narrative   83 y.o.femalewith medical history significant ofhypertension, prior stroke,hx ofleft DVT.Patient presented to the emergency department complaining of generalized weakness and fatigue.Patient was supposed to be workedup by oncology for anemia on September 05, 2017, but patient was not able to make it to the office due to severe fatigue and weakness. Upon ED evaluation, patient was unable to walk across the room, hgb 8.3, plt 52 and WBC 2.0. Patient was admitted with working diagnosis of pancytopenia with symptomatic anemia.       Subjective:    Leah Lewis today has slight constipation.  miralax apparently not enough, afebrile.  Awaiting bone marrow results.    No headache, No chest pain, No abdominal pain - No Nausea, No new weakness tingling or numbness, No Cough - SOB.    Assessment  & Plan :    Principal Problem:   Pancytopenia ([place]) Active Problems:   Pressure injury of skin    Pancytopenia with symptomatic anemia Unclear etiology Patient was transfused 3 units ofpacked RBC Oncology consulted recs appreciated S/p bonemarrow biopsy on 6/5 Trend CBC  Hypertension BP stable, not on medications   Systolic murmur Echo 9/6=> EF 60-65%, aortic  sclerosis  Constipation Increase miralax  to bid as per oncology, appreciate input    DVT prophylaxis:SCDs Code Status:Full Family Communication:No family at bedside Disposition Plan:Pending bone marrow biopsy result   Consultants:  Oncology  Procedures:  Bone marrow biopsy 6/5  Antimicrobials:  Lab Results  Component Value Date   PLT 30 (L) 09/09/2017    Antibiotics  : none  Anti-infectives (From admission, onward)   None        Objective:   Vitals:   09/08/17 0611 09/08/17 1522 09/08/17 2011 09/09/17 0500  BP: 130/62 107/66 112/64 118/62  Pulse: 76 78 91 90  Resp: '15 18  14  '$ Temp: 98.6 F (37 C) 99.8 F (37.7 C)  99.4 F (37.4 C)  TempSrc: Oral Oral  Oral  SpO2: 94% 99% 97% 98%  Weight:      Height:        Wt Readings from Last 3 Encounters:  09/25/2017 68.9 kg (152 lb)  03/02/15 72.6 kg (160 lb)  04/20/11 76 kg (167 lb 8.8 oz)     Intake/Output Summary (Last 24 hours) at 09/09/2017 0819 Last data filed at 09/09/2017 0500 Gross per 24 hour  Intake -  Output 1002 ml  Net -1002 ml     Physical Exam  Awake Alert, Oriented X 3, No new F.N deficits, Normal affect Joseph.AT,PERRAL Supple Neck,No JVD, No cervical lymphadenopathy appriciated.  Symmetrical Chest wall movement, Good air movement bilaterally, CTAB RRR,No Gallops,Rubs or new Murmurs, No Parasternal Heave +ve B.Sounds, Abd Soft, No tenderness, No organomegaly appriciated, No rebound - guarding or rigidity. No Cyanosis, Clubbing or edema, No new Rash or bruise      Data Review:    CBC Recent Labs  Lab 09/22/2017 1425 09/24/2017 1919 09/06/17 0948 09/07/17 0340 09/08/17 0400 09/09/17 0401  WBC 2.0* 2.7*  --  2.8* 2.8* 3.4*  HGB 8.3* 5.3* 11.8* 11.6* 11.6* 12.1  HCT 25.6* 16.3* 35.9* 34.2* 35.8* 37.5  PLT 52* 61* 36* 34* 36* 30*  MCV 99.2 99.4  --  90.7 93.0 94.2  MCH 32.2 32.3  --  30.8 30.1 30.4  MCHC 32.4 32.5  --  33.9 32.4 32.3  RDW 15.3 15.5  --  18.8*  18.4* 18.0*  LYMPHSABS 1.0 1.4  --  1.4  --   --   MONOABS 0.4 0.5  --  0.5  --   --   EOSABS 0.0 0.0  --  0.0  --   --   BASOSABS 0.0 0.0  --  0.0  --   --     Chemistries  Recent Labs  Lab 09/11/2017 1425 09/07/17 0340 09/09/17 0401  NA 142 138 140  K 3.6 3.7 4.2  CL 107 105 103  CO2 '26 27 30  '$ GLUCOSE 116* 131* 119*  BUN '10 10 18  '$ CREATININE 0.95 0.97 1.07*  CALCIUM 8.4* 8.4* 8.6*  AST 16  --  20  ALT 10*  --  11*  ALKPHOS 66  --  56  BILITOT 0.8  --  1.1   ------------------------------------------------------------------------------------------------------------------ No results for input(s): CHOL, HDL, LDLCALC, TRIG, CHOLHDL, LDLDIRECT in the last 72 hours.  Lab Results  Component Value Date   HGBA1C (H) 04/03/2007    6.2 (NOTE)   The ADA recommends the following therapeutic goals for glycemic   control related to Hgb A1C measurement:   Goal of Therapy:   < 7.0% Hgb A1C   Action Suggested:  > 8.0% Hgb A1C   Ref:  Diabetes Care, 22, Suppl. 1, 1999   ------------------------------------------------------------------------------------------------------------------ No results for input(s): TSH, T4TOTAL, T3FREE, THYROIDAB in the last 72 hours.  Invalid input(s): FREET3 ------------------------------------------------------------------------------------------------------------------ Recent Labs    09/06/17 0948  RETICCTPCT 0.7    Coagulation profile Recent Labs  Lab 09/24/2017 1919  INR 1.05    No results for input(s): DDIMER in the last 72 hours.  Cardiac Enzymes No results for input(s): CKMB, TROPONINI, MYOGLOBIN in the last 168 hours.  Invalid input(s): CK ------------------------------------------------------------------------------------------------------------------ No results found for: BNP  Inpatient Medications  Scheduled Meds: . ketorolac  1 drop Both Eyes QID  . nutrition supplement (JUVEN)  1 packet Oral BID BM  . polyethylene glycol  17 g  Oral BID  . prednisoLONE acetate  1 drop Both Eyes QID   Continuous Infusions: PRN Meds:.acetaminophen **OR** acetaminophen  Micro Results Recent Results (from the past 240 hour(s))  Culture, blood (Routine X 2) w Reflex to ID Panel     Status: None (Preliminary result)   Collection Time: 09/03/2017  7:19 PM  Result Value Ref Range Status   Specimen Description   Final    BLOOD RIGHT HAND Performed at [facility] [address], [place], [place] [address]    Special Requests   Final    BOTTLES DRAWN AEROBIC ONLY Blood Culture adequate volume Performed at [facility] [address], [place], [place] [address]    Culture   Final    NO GROWTH 2 DAYS Performed at [facility] [address], [place], [place] [address]    Report Status PENDING  Incomplete  Culture, blood (Routine X 2) w Reflex to ID Panel     Status: None (Preliminary result)   Collection Time: 09/25/2017  7:19 PM  Result Value Ref Range Status   Specimen Description   Final    BLOOD LEFT HAND Performed at [facility] [address] [place], [place] [address]    Special Requests   Final    BOTTLES DRAWN AEROBIC ONLY Blood Culture adequate volume Performed at [facility] [address], [place], [place] [address]    Culture   Final    NO GROWTH 2 DAYS Performed at [facility] [address] [place], [place] [address]    Report Status PENDING  Incomplete    Radiology Reports Dg Chest 2 View  Result Date: 09/21/2017 CLINICAL DATA:  Generalized weakness EXAM: CHEST - 2 VIEW COMPARISON:  03/02/2015 FINDINGS: The lungs are well inflated. The cardiomediastinal contours are normal. Hazy opacities in the right lung base are unchanged. There is no pleural effusion or pneumothorax. IMPRESSION: No active cardiopulmonary disease. Electronically Signed   By: Ulyses Jarred M.D.   On: 09/10/2017 15:25   Ct Biopsy  Result Date:  09/07/2017 INDICATION: Lymphoma versus leukemia, pancytopenia EXAM: CT GUIDED RIGHT ILIAC BONE MARROW ASPIRATION AND CORE BIOPSY Date:  09/07/2017 09/07/2017 12:00 pm Radiologist:  Jerilynn Mages. Daryll Brod, MD Guidance:  CT FLUOROSCOPY TIME:  Fluoroscopy Time: None. MEDICATIONS: 1% lidocaine local ANESTHESIA/SEDATION: 0.5 mg IV Versed; 25 mcg IV Fentanyl Moderate Sedation Time:  10 minutes The patient was continuously monitored during the procedure by the interventional radiology nurse under my direct supervision. CONTRAST:  None. COMPLICATIONS: None PROCEDURE: Informed consent was obtained from the patient following explanation of the procedure, risks, benefits and alternatives. The patient understands, agrees and consents for the procedure. All questions were addressed. A time out was performed. The patient was positioned prone and non-contrast localization CT was performed of the pelvis to demonstrate the iliac marrow spaces. Maximal barrier sterile technique utilized including caps, mask, sterile gowns, sterile gloves, large sterile drape, hand hygiene, and Betadine prep. Under sterile conditions and local anesthesia, an  11 gauge coaxial bone biopsy needle was advanced into the right iliac marrow space. Needle position was confirmed with CT imaging. Initially, bone marrow aspiration was performed. Next, the 11 gauge outer cannula was utilized to obtain a right iliac bone marrow core biopsy. Needle was removed. Hemostasis was obtained with compression. The patient tolerated the procedure well. Samples were prepared with the cytotechnologist. No immediate complications. IMPRESSION: CT guided right iliac bone marrow aspiration and core biopsy. Electronically Signed   By: Jerilynn Mages.  Shick M.D.   On: 09/07/2017 12:23   Ct Bone Marrow Biopsy & Aspiration  Result Date: 09/07/2017 INDICATION: Lymphoma versus leukemia, pancytopenia EXAM: CT GUIDED RIGHT ILIAC BONE MARROW ASPIRATION AND CORE BIOPSY Date:  09/07/2017 09/07/2017 12:00 pm  Radiologist:  Jerilynn Mages. Daryll Brod, MD Guidance:  CT FLUOROSCOPY TIME:  Fluoroscopy Time: None. MEDICATIONS: 1% lidocaine local ANESTHESIA/SEDATION: 0.5 mg IV Versed; 25 mcg IV Fentanyl Moderate Sedation Time:  10 minutes The patient was continuously monitored during the procedure by the interventional radiology nurse under my direct supervision. CONTRAST:  None. COMPLICATIONS: None PROCEDURE: Informed consent was obtained from the patient following explanation of the procedure, risks, benefits and alternatives. The patient understands, agrees and consents for the procedure. All questions were addressed. A time out was performed. The patient was positioned prone and non-contrast localization CT was performed of the pelvis to demonstrate the iliac marrow spaces. Maximal barrier sterile technique utilized including caps, mask, sterile gowns, sterile gloves, large sterile drape, hand hygiene, and Betadine prep. Under sterile conditions and local anesthesia, an 11 gauge coaxial bone biopsy needle was advanced into the right iliac marrow space. Needle position was confirmed with CT imaging. Initially, bone marrow aspiration was performed. Next, the 11 gauge outer cannula was utilized to obtain a right iliac bone marrow core biopsy. Needle was removed. Hemostasis was obtained with compression. The patient tolerated the procedure well. Samples were prepared with the cytotechnologist. No immediate complications. IMPRESSION: CT guided right iliac bone marrow aspiration and core biopsy. Electronically Signed   By: Jerilynn Mages.  Shick M.D.   On: 09/07/2017 12:23    Time Spent in minutes  30   Jani Gravel M.D on 09/09/2017 at 8:19 AM  Between 7am to 7pm - Pager - [phone]   After 7pm go to [email or web address] - password [facility]  [facility] -  Office  [phone]

## 2017-09-09 NOTE — Progress Notes (Signed)
I received a call from the pathologist regarding Leah Lewis bone marrow.  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  This is not all that surprising.  Given that she is 83 years old, I would not think that she is a candidate for any aggressive therapy.  Her blood counts are holding relatively stable.  Her platelet count is on the low side.  The cytogenetics would not be out for several days.  I would strongly recommend trying to transfer the patient to [facility], she is 83 years old.  She is in good shape.  It is conceivable that she might be a candidate for some type of induction therapy.  I think the real critical piece of information which we do not have are the genetic mutation studies that are done.  This can really help with therapy decisions.  I think that the other option is for her to be discharged and we can try to follow-up with her in the office next week.  We could consider therapy for her with decitabine and possibly venetoclax.  Again, having the mutation studies are vital in this current age of precision medicine.  She is holding stable.  She looks pretty good.  Her appetite is improving a little bit.  Unfortunately, I have not been able to talk to Leah Lewis.  I do not see that she needs any transfusions right now.  She really responded well to the transfusions when she was admitted.  [name], MD  Psalm 34:8

## 2017-09-09 NOTE — Care Management Note (Addendum)
Case Management Note  Patient Details  Name: Gwenetta J Martinique MRN: [id number] Date of Birth: May 14, 1934  This CM checked back in with pt for choice for home health services. She defers to daughter Lorenso Quarry for choice. This CM called daughter and [facility] was chosen. [facility] rep alerted of referral. This CM also contacted financial advisor for assistance with applying for Medicaid per daughter's request.  Expected Discharge Date:  (unknown)               Expected Discharge Plan:  [place]  In-House Referral:     Discharge planning Services  CM Consult  Post Acute Care Choice:  Home Health Choice offered to:  Patient, Adult Children  DME Arranged:    DME Agency:     HH Arranged:   PT/RN/Aide/SW HH Agency:     Status of Service:  In process, will continue to follow  If discussed at Long Length of Stay Meetings, dates discussed:    Additional CommentsLynnell Catalan, RN 09/09/2017, 10:06 AM  [phone]

## 2017-09-10 LAB — PROCALCITONIN: Procalcitonin: 0.15 ng/mL

## 2017-09-10 MED ORDER — SODIUM CHLORIDE 0.9 % IV SOLN
2.0000 g | Freq: Three times a day (TID) | INTRAVENOUS | Status: DC
  Administered 2017-09-10 – 2017-09-11 (×3): 2 g via INTRAVENOUS
  Filled 2017-09-10 (×4): qty 2

## 2017-09-10 NOTE — Progress Notes (Signed)
PROGRESS NOTE    Leah Lewis  ALP:[id number] DOB: 01-25-1935 DOA: 09/22/2017 PCP: Wenda Low, MD     Brief Narrative:  83 year old with past medical history relevant for osteoporosis, hyperlipidemia, history of DVT on rivaroxaban admitted with weakness and pancytopenia and found to have likely AML.  Her course is complicated by fevers of unclear origin.   Assessment & Plan:   Principal Problem:   Pancytopenia ([place]) Active Problems:   Pressure injury of skin   #) Fever without a source: At this time patient does not have any localizing symptoms for her fever.  It is relatively high.  Suspect most likely fever secondary to AML however per oncology patient is at high risk for invasive infections. 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  She is pending advance flow cytometry. -No evidence of tumor lysis syndrome - Oncology following, appreciate recommendations  #) Hypertension: Currently off any medications  #) Hyperlipidemia: -Continue atorvastatin 40 mg daily  #) History of DVT: - Hold rivaroxaban due to thrombocytopenia  #) History of CVA: -Hold aspirin 81 mg due to thrombocytopenia  #) Osteoporosis: -Hold alendronate 70 mg daily -Hold vitamin D/calcium  Fluids: Tolerating p.o. Elect lites: Monitor and supplement Nutrition: Regular diet  Prophylaxis: Thrombocytopenic  Disposition: Pending evaluation of fevers  Full code  Consultants:   Oncology  Interventional radiology  Procedures: (Don't include imaging studies which can be auto populated. Include things that cannot be auto populated i.e. Echo, Carotid and venous dopplers, Foley, Bipap, HD, tubes/drains, wound vac, central lines etc)  09/07/2017 CT-guided bone marrow biopsy  Antimicrobials: (specify start and planned stop date. Auto  populated tables are space occupying and do not give end dates)  IV cefepime started 09/10/2017   Subjective: Patient reports she is doing fairly well.  She does not have any complaints.  She denies any nausea, vomiting, diarrhea, cough, abdominal pain.  She reports that her fatigue is improved dramatically.  Objective: Vitals:   09/09/17 0500 09/09/17 1328 09/09/17 2105 09/10/17 0529  BP: 118/62 115/66 127/70 (!) 175/71  Pulse: 90 86 (!) 108 80  Resp: '14 16 20 '$ (!) 24  Temp: 99.4 F (37.4 C) 99.6 F (37.6 C) (!) 102.9 F (39.4 C) (!) 97.5 F (36.4 C)  TempSrc: Oral Oral Oral Oral  SpO2: 98% 96% 100% 100%  Weight:      Height:        Intake/Output Summary (Last 24 hours) at 09/10/2017 1255 Last data filed at 09/10/2017 1230 Gross per 24 hour  Intake 600 ml  Output 1800 ml  Net -1200 ml   Filed Weights   09/17/2017 1416  Weight: 68.9 kg (152 lb)    Examination:  General exam: Appears calm and comfortable  Respiratory system: Clear to auscultation. Respiratory effort normal. Cardiovascular system: Regular rate and rhythm, no murmurs Gastrointestinal system: Abdomen is nondistended, soft and nontender. No organomegaly or masses felt. Normal bowel sounds heard. Central nervous system: Alert and oriented. No focal neurological deficits. Extremities: No lower extremity edema Skin: No rashes Psychiatry: Judgement and insight appear normal. Mood & affect appropriate.     Data Reviewed: I have personally reviewed following labs and imaging studies  CBC: Recent Labs  Lab 09/12/2017 1425 09/16/2017 1919 09/06/17 0948 09/07/17 0340 09/08/17 0400 09/09/17 0401  WBC 2.0* 2.7*  --  2.8* 2.8* 3.4*  NEUTROABS 0.6* 0.8*  --  0.8*  --   --   HGB 8.3* 5.3* 11.8* 11.6* 11.6* 12.1  HCT 25.6* 16.3* 35.9* 34.2* 35.8* 37.5  MCV 99.2 99.4  --  90.7 93.0 94.2  PLT 52* 61* 36* 34* 36* 30*   Basic Metabolic Panel: Recent Labs  Lab 09/25/2017 1425 09/07/17 0340 09/09/17 0401  NA 142 138  140  K 3.6 3.7 4.2  CL 107 105 103  CO2 '26 27 30  '$ GLUCOSE 116* 131* 119*  BUN '10 10 18  '$ CREATININE 0.95 0.97 1.07*  CALCIUM 8.4* 8.4* 8.6*   GFR: Estimated Creatinine Clearance: 39.3 mL/min (A) (by C-G formula based on SCr of 1.07 mg/dL (H)). Liver Function Tests: Recent Labs  Lab 09/07/2017 1425 09/09/17 0401  AST 16 20  ALT 10* 11*  ALKPHOS 66 56  BILITOT 0.8 1.1  PROT 7.4 7.5  ALBUMIN 3.5 3.2*   Recent Labs  Lab 09/28/2017 1425  LIPASE 22   No results for input(s): AMMONIA in the last 168 hours. Coagulation Profile: Recent Labs  Lab 09/27/2017 1919  INR 1.05   Cardiac Enzymes: No results for input(s): CKTOTAL, CKMB, CKMBINDEX, TROPONINI in the last 168 hours. BNP (last 3 results) No results for input(s): PROBNP in the last 8760 hours. HbA1C: No results for input(s): HGBA1C in the last 72 hours. CBG: No results for input(s): GLUCAP in the last 168 hours. Lipid Profile: No results for input(s): CHOL, HDL, LDLCALC, TRIG, CHOLHDL, LDLDIRECT in the last 72 hours. Thyroid Function Tests: No results for input(s): TSH, T4TOTAL, FREET4, T3FREE, THYROIDAB in the last 72 hours. Anemia Panel: No results for input(s): VITAMINB12, FOLATE, FERRITIN, TIBC, IRON, RETICCTPCT in the last 72 hours. Sepsis Labs: Recent Labs  Lab 09/10/17 0859  PROCALCITON 0.15    Recent Results (from the past 240 hour(s))  Culture, blood (Routine X 2) w Reflex to ID Panel     Status: None (Preliminary result)   Collection Time: 09/15/2017  7:19 PM  Result Value Ref Range Status   Specimen Description   Final    BLOOD RIGHT HAND Performed at [facility] [address], [place], [place] [address]    Special Requests   Final    BOTTLES DRAWN AEROBIC ONLY Blood Culture adequate volume Performed at [facility] [address] [place], [place] [address]    Culture   Final    NO GROWTH 3 DAYS Performed at [facility], [place] [address],  [place], [place] [address]    Report Status PENDING  Incomplete  Culture, blood (Routine X 2) w Reflex to ID Panel     Status: None (Preliminary result)   Collection Time: 10/02/2017  7:19 PM  Result Value Ref Range Status   Specimen Description   Final    BLOOD LEFT HAND Performed at [facility] [address], [place], [place] [address]    Special Requests   Final    BOTTLES DRAWN AEROBIC ONLY Blood Culture adequate volume Performed at [facility] [address], [place], [place] [address]    Culture   Final    NO GROWTH 3 DAYS Performed at [facility], [place] [address] [place], [place] [address]    Report Status PENDING  Incomplete         Radiology Studies: No results found.      Scheduled Meds: . ketorolac  1 drop Both Eyes QID  . nutrition  supplement (JUVEN)  1 packet Oral BID BM  . polyethylene glycol  17 g Oral BID  . prednisoLONE acetate  1 drop Both Eyes QID   Continuous Infusions: . ceFEPime (MAXIPIME) IV       LOS: 4 days    Time spent: [place], MD [facility]   If 7PM-7AM, please contact night-coverage [email or web address] Password [facility] 09/10/2017, 12:55 PM

## 2017-09-11 LAB — CBC
HCT: 36.3 % (ref 36.0–46.0)
Hemoglobin: 11.6 g/dL — ABNORMAL LOW (ref 12.0–15.0)
MCH: 30.1 pg (ref 26.0–34.0)
MCHC: 32 g/dL (ref 30.0–36.0)
MCV: 94 fL (ref 78.0–100.0)
Platelets: 23 K/uL — CL (ref 150–400)
RBC: 3.86 MIL/uL — ABNORMAL LOW (ref 3.87–5.11)
RDW: 17.8 % — ABNORMAL HIGH (ref 11.5–15.5)
WBC: 3.3 10*3/uL — ABNORMAL LOW (ref 4.0–10.5)

## 2017-09-11 LAB — BASIC METABOLIC PANEL
BUN: 26 mg/dL — ABNORMAL HIGH (ref 6–20)
CO2: 30 mmol/L (ref 22–32)
Calcium: 8.7 mg/dL — ABNORMAL LOW (ref 8.9–10.3)
Chloride: 100 mmol/L — ABNORMAL LOW (ref 101–111)
Creatinine, Ser: 1.2 mg/dL — ABNORMAL HIGH (ref 0.44–1.00)
GFR calc Af Amer: 48 mL/min — ABNORMAL LOW (ref >60)
Sodium: 137 mmol/L (ref 135–145)

## 2017-09-11 LAB — OCCULT BLOOD X 1 CARD TO LAB, STOOL: Fecal Occult Bld: NEGATIVE

## 2017-09-11 LAB — CULTURE, BLOOD (ROUTINE X 2)
CULTURE: NO GROWTH
Culture: NO GROWTH
Special Requests: ADEQUATE
Special Requests: ADEQUATE

## 2017-09-11 LAB — BASIC METABOLIC PANEL WITH GFR
Anion gap: 7 (ref 5–15)
GFR calc non Af Amer: 41 mL/min — ABNORMAL LOW (ref >60)
Glucose, Bld: 124 mg/dL — ABNORMAL HIGH (ref 65–99)
Potassium: 4.4 mmol/L (ref 3.5–5.1)

## 2017-09-11 LAB — MAGNESIUM: Magnesium: 2.3 mg/dL (ref 1.7–2.4)

## 2017-09-11 LAB — PROCALCITONIN: Procalcitonin: 0.28 ng/mL

## 2017-09-11 MED ORDER — SODIUM CHLORIDE 0.9 % IV SOLN
2.0000 g | Freq: Two times a day (BID) | INTRAVENOUS | Status: DC
Start: 2017-09-11 — End: 2017-09-16
  Administered 2017-09-11 – 2017-09-16 (×10): 2 g via INTRAVENOUS
  Filled 2017-09-11 (×11): qty 2

## 2017-09-11 MED ORDER — SODIUM CHLORIDE 0.9 % IV SOLN
INTRAVENOUS | Status: AC
  Administered 2017-09-11: 14:00:00 via INTRAVENOUS

## 2017-09-11 MED ORDER — ONDANSETRON HCL 4 MG/2ML IJ SOLN
4.0000 mg | Freq: Four times a day (QID) | INTRAMUSCULAR | Status: DC | PRN
  Administered 2017-09-11: 4 mg via INTRAVENOUS
  Filled 2017-09-11: qty 2

## 2017-09-11 NOTE — Progress Notes (Signed)
PHARMACY NOTE:  ANTIMICROBIAL RENAL DOSAGE ADJUSTMENT  Current antimicrobial regimen includes a mismatch between antimicrobial dosage and estimated renal function.  As per policy approved by the Pharmacy & Therapeutics and Medical Executive Committees, the antimicrobial dosage will be adjusted accordingly.  Current antimicrobial dosage:  Cefepime 2g IV q8  Indication: FN  Renal Function:  Estimated Creatinine Clearance: 35.1 mL/min (A) (by C-G formula based on SCr of 1.2 mg/dL (H)). []      On intermittent HD, scheduled: []      On CRRT    Antimicrobial dosage has been changed to:  Cefepime 2g IV q12  Additional comments:   Thank you for allowing pharmacy to be a part of this patient's care.  Kara Mead, [facility] 09/11/2017 12:07 PM

## 2017-09-11 NOTE — Progress Notes (Signed)
PROGRESS NOTE    Leah Lewis  IDP:[id number] DOB: 05-Jul-1934 DOA: 09/26/2017 PCP: Wenda Low, MD     Brief Narrative:  83 year old with past medical history relevant for osteoporosis, hyperlipidemia, history of DVT on rivaroxaban admitted with weakness and pancytopenia and found to have likely AML.  Her course is complicated by fevers of unclear origin.   Assessment & Plan:   Principal Problem:   Pancytopenia ([place]) Active Problems:   Pressure injury of skin   #) Fever without a source: Likely paraneoplastic as no evidence of focal infection. 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  She is pending advance flow cytometry. -No evidence of tumor lysis syndrome - Oncology following, appreciate recommendations  #) Hypertension: Currently off any medications  #) Hyperlipidemia: -Continue atorvastatin 40 mg daily  #) History of DVT: - Hold rivaroxaban due to thrombocytopenia  #) History of CVA: -Hold aspirin 81 mg due to thrombocytopenia  #) Osteoporosis: -Hold alendronate 70 mg daily -Hold vitamin D/calcium  Fluids: Tolerating p.o. Elect lites: Monitor and supplement Nutrition: Regular diet  Prophylaxis: Thrombocytopenic  Disposition: Pending evaluation of fevers  Full code  Consultants:   Oncology  Interventional radiology  Procedures: (Don't include imaging studies which can be auto populated. Include things that cannot be auto populated i.e. Echo, Carotid and venous dopplers, Foley, Bipap, HD, tubes/drains, wound vac, central lines etc)  09/07/2017 CT-guided bone marrow biopsy  Antimicrobials: (specify start and planned stop date. Auto populated tables are space occupying and do not give end dates)  IV cefepime started 09/10/2017   Subjective: Patient reports she is doing fairly well.   She does not have any complaints.  She denies any nausea, vomiting, diarrhea.  Objective: Vitals:   09/10/17 0529 09/10/17 1339 09/10/17 2203 09/11/17 0621  BP: (!) 175/71 111/65 118/65 (!) 180/90  Pulse: 80 97 89 95  Resp: (!) '24 16 16 16  '$ Temp: (!) 97.5 F (36.4 C) 98.7 F (37.1 C) (!) 100.9 F (38.3 C) 99.8 F (37.7 C)  TempSrc: Oral Oral Oral Oral  SpO2: 100% 96% 97% 97%  Weight:      Height:        Intake/Output Summary (Last 24 hours) at 09/11/2017 1106 Last data filed at 09/11/2017 0446 Gross per 24 hour  Intake 600 ml  Output 750 ml  Net -150 ml   Filed Weights   09/26/2017 1416  Weight: 68.9 kg (152 lb)    Examination:  General exam: Appears calm and comfortable  Respiratory system: Clear to auscultation. Respiratory effort normal. Cardiovascular system: Regular rate and rhythm, no murmurs Gastrointestinal system: Abdomen is nondistended, soft and nontender. No organomegaly or masses felt. Normal bowel sounds heard. Central nervous system: Alert and oriented. No focal neurological deficits. Extremities: No lower extremity edema Skin: No rashes Psychiatry: Judgement and insight appear normal. Mood & affect appropriate.     Data Reviewed: I have personally reviewed following labs and imaging studies  CBC: Recent Labs  Lab 09/25/2017 1425 09/27/2017 1919 09/06/17 0948 09/07/17 0340 09/08/17 0400 09/09/17 0401 09/11/17 0429  WBC 2.0* 2.7*  --  2.8* 2.8* 3.4* 3.3*  NEUTROABS 0.6* 0.8*  --  0.8*  --   --   --   HGB 8.3* 5.3* 11.8* 11.6* 11.6* 12.1 11.6*  HCT 25.6* 16.3* 35.9* 34.2* 35.8* 37.5 36.3  MCV  99.2 99.4  --  90.7 93.0 94.2 94.0  PLT 52* 61* 36* 34* 36* 30* 23*   Basic Metabolic Panel: Recent Labs  Lab 09/23/2017 1425 09/07/17 0340 09/09/17 0401 09/11/17 0429  NA 142 138 140 137  K 3.6 3.7 4.2 4.4  CL 107 105 103 100*  CO2 '26 27 30 30  '$ GLUCOSE 116* 131* 119* 124*  BUN '10 10 18 '$ 26*  CREATININE 0.95 0.97 1.07* 1.20*  CALCIUM 8.4* 8.4* 8.6*  8.7*  MG  --   --   --  2.3   GFR: Estimated Creatinine Clearance: 35.1 mL/min (A) (by C-G formula based on SCr of 1.2 mg/dL (H)). Liver Function Tests: Recent Labs  Lab 09/14/2017 1425 09/09/17 0401  AST 16 20  ALT 10* 11*  ALKPHOS 66 56  BILITOT 0.8 1.1  PROT 7.4 7.5  ALBUMIN 3.5 3.2*   Recent Labs  Lab 09/22/2017 1425  LIPASE 22   No results for input(s): AMMONIA in the last 168 hours. Coagulation Profile: Recent Labs  Lab 09/25/2017 1919  INR 1.05   Cardiac Enzymes: No results for input(s): CKTOTAL, CKMB, CKMBINDEX, TROPONINI in the last 168 hours. BNP (last 3 results) No results for input(s): PROBNP in the last 8760 hours. HbA1C: No results for input(s): HGBA1C in the last 72 hours. CBG: No results for input(s): GLUCAP in the last 168 hours. Lipid Profile: No results for input(s): CHOL, HDL, LDLCALC, TRIG, CHOLHDL, LDLDIRECT in the last 72 hours. Thyroid Function Tests: No results for input(s): TSH, T4TOTAL, FREET4, T3FREE, THYROIDAB in the last 72 hours. Anemia Panel: No results for input(s): VITAMINB12, FOLATE, FERRITIN, TIBC, IRON, RETICCTPCT in the last 72 hours. Sepsis Labs: Recent Labs  Lab 09/10/17 0859 09/11/17 0429  PROCALCITON 0.15 0.28    Recent Results (from the past 240 hour(s))  Culture, blood (Routine X 2) w Reflex to ID Panel     Status: None (Preliminary result)   Collection Time: 09/09/2017  7:19 PM  Result Value Ref Range Status   Specimen Description   Final    BLOOD RIGHT HAND Performed at [facility] [address], [place], [place] [address]    Special Requests   Final    BOTTLES DRAWN AEROBIC ONLY Blood Culture adequate volume Performed at [facility] [address], [place], [place] [address]    Culture   Final    NO GROWTH 4 DAYS Performed at [facility], [place] [address], [place], [place] [address]    Report Status PENDING  Incomplete  Culture, blood (Routine X 2) w Reflex to ID  Panel     Status: None (Preliminary result)   Collection Time: 09/18/2017  7:19 PM  Result Value Ref Range Status   Specimen Description   Final    BLOOD LEFT HAND Performed at [facility] [address], [place], [place] [address]    Special Requests   Final    BOTTLES DRAWN AEROBIC ONLY Blood Culture adequate volume Performed at [facility] [address], [place], [place] [address]    Culture   Final    NO GROWTH 4 DAYS Performed at [facility], [place] [address] [place], [place] [address]    Report Status PENDING  Incomplete  Culture, blood (routine x 2)     Status: None (Preliminary result)   Collection Time: 09/10/17  8:45 AM  Result Value Ref Range Status   Specimen Description BLOOD LEFT ARM  Final   Special Requests   Final    BOTTLES DRAWN AEROBIC ONLY Blood Culture adequate volume Performed at [facility], [place] [address], [place], [place] [address]    Culture PENDING  Incomplete   Report Status PENDING  Incomplete  Culture, blood (routine x 2)     Status: None (Preliminary result)   Collection Time: 09/10/17  8:58 AM  Result Value Ref Range Status   Specimen Description BLOOD LEFT ARM  Final   Special Requests   Final    BOTTLES DRAWN AEROBIC AND ANAEROBIC Blood Culture adequate volume Performed at [facility], [place] [address] [place], [place] [address]    Culture PENDING  Incomplete   Report Status PENDING  Incomplete         Radiology Studies: No results found.      Scheduled Meds: . ketorolac  1 drop Both Eyes QID  . nutrition supplement (JUVEN)  1 packet Oral BID BM  . polyethylene glycol  17 g Oral BID  . prednisoLONE acetate  1 drop Both Eyes QID   Continuous Infusions: . sodium chloride    . ceFEPime (MAXIPIME) IV 2 g (09/11/17 0653)     LOS: 5 days    Time spent: [place], MD [facility]   If 7PM-7AM, please contact  night-coverage [email or web address] Password [email or web address] 09/11/2017, 11:06 AM

## 2017-09-12 ENCOUNTER — Inpatient Hospital Stay (HOSPITAL_COMMUNITY): Payer: Medicare Other

## 2017-09-12 ENCOUNTER — Encounter (HOSPITAL_COMMUNITY): Payer: Self-pay | Admitting: Hematology & Oncology

## 2017-09-12 DIAGNOSIS — Z7189 Other specified counseling: Secondary | ICD-10-CM

## 2017-09-12 DIAGNOSIS — C9200 Acute myeloblastic leukemia, not having achieved remission: Secondary | ICD-10-CM

## 2017-09-12 DIAGNOSIS — M7989 Other specified soft tissue disorders: Secondary | ICD-10-CM

## 2017-09-12 HISTORY — DX: Other specified counseling: Z71.89

## 2017-09-12 HISTORY — DX: Acute myeloblastic leukemia, not having achieved remission: C92.00

## 2017-09-12 LAB — CBC WITH DIFFERENTIAL/PLATELET
Basophils Absolute: 0 K/uL (ref 0.0–0.1)
Basophils Absolute: 0 K/uL (ref 0.0–0.1)
Basophils Relative: 0 %
Basophils Relative: 1 %
Blasts: 3 %
Blasts: 5 %
Eosinophils Absolute: 0 10*3/uL (ref 0.0–0.7)
Eosinophils Absolute: 0 K/uL (ref 0.0–0.7)
Eosinophils Relative: 0 %
Eosinophils Relative: 0 %
HCT: 33.1 % — ABNORMAL LOW (ref 36.0–46.0)
HCT: 32.1 % — ABNORMAL LOW (ref 36.0–46.0)
Hemoglobin: 10.6 g/dL — ABNORMAL LOW (ref 12.0–15.0)
Hemoglobin: 10.3 g/dL — ABNORMAL LOW (ref 12.0–15.0)
Lymphocytes Relative: 64 %
Lymphocytes Relative: 26 %
Lymphs Abs: 3.5 10*3/uL (ref 0.7–4.0)
Lymphs Abs: 1 10*3/uL (ref 0.7–4.0)
MCH: 30.6 pg (ref 26.0–34.0)
MCH: 30.6 pg (ref 26.0–34.0)
MCHC: 32 g/dL (ref 30.0–36.0)
MCHC: 32.1 g/dL (ref 30.0–36.0)
MCV: 95.7 fL (ref 78.0–100.0)
MCV: 95.3 fL (ref 78.0–100.0)
Monocytes Absolute: 0.9 10*3/uL (ref 0.1–1.0)
Monocytes Absolute: 2 K/uL (ref 0.1–1.0)
Monocytes Relative: 17 %
Monocytes Relative: 52 %
Neutro Abs: 0.9 10*3/uL — ABNORMAL LOW (ref 1.7–7.7)
Neutro Abs: 0.8 10*3/uL (ref 1.7–7.7)
Neutrophils Relative %: 16 %
Neutrophils Relative %: 21 %
Platelets: 23 10*3/uL — CL (ref 150–400)
Platelets: 114 10*3/uL — ABNORMAL LOW (ref 150–400)
RBC: 3.46 MIL/uL — ABNORMAL LOW (ref 3.87–5.11)
RBC: 3.37 MIL/uL — ABNORMAL LOW (ref 3.87–5.11)
RDW: 18.1 % — ABNORMAL HIGH (ref 11.5–15.5)
RDW: 18.1 % — ABNORMAL HIGH (ref 11.5–15.5)
WBC: 5.5 K/uL (ref 4.0–10.5)
WBC: 3.9 10*3/uL — ABNORMAL LOW (ref 4.0–10.5)

## 2017-09-12 LAB — BASIC METABOLIC PANEL
BUN: 14 mg/dL (ref 6–20)
Calcium: 8.1 mg/dL — ABNORMAL LOW (ref 8.9–10.3)
Creatinine, Ser: 1.05 mg/dL — ABNORMAL HIGH (ref 0.44–1.00)
GFR calc non Af Amer: 48 mL/min — ABNORMAL LOW (ref >60)
Glucose, Bld: 148 mg/dL — ABNORMAL HIGH (ref 65–99)
Potassium: 3.9 mmol/L (ref 3.5–5.1)
Sodium: 138 mmol/L (ref 135–145)

## 2017-09-12 LAB — BLOOD GAS, ARTERIAL
Acid-base deficit: 0.6 mmol/L (ref 0.0–2.0)
Bicarbonate: 22.6 mmol/L (ref 20.0–28.0)
Drawn by: 270211
FIO2: 0.21
O2 Saturation: 95 %
Patient temperature: 37
pCO2 arterial: 33.6 mmHg (ref 32.0–48.0)
pH, Arterial: 7.443 (ref 7.350–7.450)
pO2, Arterial: 71 mmHg — ABNORMAL LOW (ref 83.0–108.0)

## 2017-09-12 LAB — PROCALCITONIN: Procalcitonin: 0.44 ng/mL

## 2017-09-12 LAB — LACTIC ACID, PLASMA
Lactic Acid, Venous: 2.7 mmol/L (ref 0.5–1.9)
Lactic Acid, Venous: 3.1 mmol/L (ref 0.5–1.9)
Lactic Acid, Venous: 1.4 mmol/L (ref 0.5–1.9)

## 2017-09-12 LAB — URIC ACID: Uric Acid, Serum: 2.1 mg/dL — ABNORMAL LOW (ref 2.3–6.6)

## 2017-09-12 LAB — LACTATE DEHYDROGENASE: LDH: 289 U/L — ABNORMAL HIGH (ref 98–192)

## 2017-09-12 LAB — BASIC METABOLIC PANEL WITH GFR
Anion gap: 7 (ref 5–15)
CO2: 27 mmol/L (ref 22–32)
Chloride: 104 mmol/L (ref 101–111)
GFR calc Af Amer: 56 mL/min — ABNORMAL LOW (ref >60)

## 2017-09-12 LAB — TYPE AND SCREEN
ABO/RH(D): A POS
Antibody Screen: NEGATIVE

## 2017-09-12 LAB — MRSA PCR SCREENING: MRSA by PCR: NEGATIVE

## 2017-09-12 LAB — PHOSPHORUS: Phosphorus: 2 mg/dL — ABNORMAL LOW (ref 2.5–4.6)

## 2017-09-12 MED ORDER — SODIUM CHLORIDE 0.9 % IV BOLUS
1000.0000 mL | Freq: Once | INTRAVENOUS | Status: AC
  Administered 2017-09-12: 1000 mL via INTRAVENOUS

## 2017-09-12 MED ORDER — VANCOMYCIN HCL 10 G IV SOLR
1500.0000 mg | Freq: Once | INTRAVENOUS | Status: AC
  Administered 2017-09-12: 1500 mg via INTRAVENOUS
  Filled 2017-09-12: qty 1500

## 2017-09-12 MED ORDER — ENSURE ENLIVE PO LIQD
237.0000 mL | Freq: Two times a day (BID) | ORAL | Status: DC
  Administered 2017-09-14 – 2017-09-22 (×13): 237 mL via ORAL

## 2017-09-12 MED ORDER — ALLOPURINOL 100 MG PO TABS
100.0000 mg | ORAL_TABLET | Freq: Every day | ORAL | Status: DC
  Administered 2017-09-12 – 2017-09-15 (×3): 100 mg via ORAL
  Filled 2017-09-12 (×3): qty 1

## 2017-09-12 MED ORDER — VANCOMYCIN HCL IN DEXTROSE 750-5 MG/150ML-% IV SOLN
750.0000 mg | INTRAVENOUS | Status: DC
Start: 2017-09-13 — End: 2017-09-16
  Administered 2017-09-13 – 2017-09-16 (×4): 750 mg via INTRAVENOUS
  Filled 2017-09-12 (×5): qty 150

## 2017-09-12 MED ORDER — BIOTENE DRY MOUTH MT LIQD
15.0000 mL | OROMUCOSAL | Status: DC
  Administered 2017-09-13 – 2017-09-22 (×18): 15 mL via OROMUCOSAL
  Filled 2017-09-12: qty 15

## 2017-09-12 MED ORDER — SODIUM CHLORIDE 0.9 % IV SOLN
Freq: Once | INTRAVENOUS | Status: AC
  Administered 2017-09-16: 10:00:00 via INTRAVENOUS

## 2017-09-12 NOTE — Progress Notes (Signed)
PROGRESS NOTE    Leah Lewis  NGE:[id number] DOB: March 23, 1935 DOA: 09/08/2017 PCP: Wenda Low, MD     Brief Narrative:  83 year old with past medical history relevant for osteoporosis, hyperlipidemia, history of DVT on rivaroxaban admitted with weakness and pancytopenia and found to have  AML.  Her course is complicated by fevers of unclear origin.   Assessment & Plan:   Active Problems:   Pressure injury of skin   AML (acute myeloid leukemia) (HCC)   Goals of care, counseling/discussion   #) Fever without a source: Likely paraneoplastic as no evidence of focal infection. 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  She is pending advance flow cytometry for further prognostic factors.  Per review of the chart patient's primary cardiologist feels that she might be a candidate for gentle chemotherapy such as decitabine -Platelet transfusion - We will discuss with interventional radiology about placement of port -No evidence of tumor lysis syndrome - Oncology following, appreciate recommendations  #) Hypertension: Currently off any medications  #) Hyperlipidemia: -Continue atorvastatin 40 mg daily  #) History of DVT: - Hold rivaroxaban due to thrombocytopenia  #) History of CVA: -Hold aspirin 81 mg due to thrombocytopenia  #) Osteoporosis: -Hold alendronate 70 mg daily -Hold vitamin D/calcium  Fluids: Tolerating p.o. Elect lites: Monitor and supplement Nutrition: Regular diet  Prophylaxis: Thrombocytopenic  Disposition: Pending evaluation of fevers and placement of port  Full code  Consultants:   Oncology  Interventional radiology  Procedures: (Don't include imaging studies which can be auto populated. Include things that cannot be auto populated i.e. Echo, Carotid and venous dopplers, Foley,  Bipap, HD, tubes/drains, wound vac, central lines etc)  09/07/2017 CT-guided bone marrow biopsy  Antimicrobials: (specify start and planned stop date. Auto populated tables are space occupying and do not give end dates)  IV cefepime started 09/10/2017   Subjective: Patient reports she is doing fairly well.  She does not have any complaints.  She mostly is complaining about a very dry mouth.  She had a fever last night but she does not feel febrile.  She denies any cough, congestion, nausea, vomiting, diarrhea.  Objective: Vitals:   09/11/17 1330 09/11/17 1500 09/11/17 2119 09/12/17 0504  BP: 123/72  (!) 99/48 (!) 99/49  Pulse: 91  79 86  Resp: 15  (!) 28 (!) 24  Temp: (!) 103.3 F (39.6 C) 99.4 F (37.4 C) 98.9 F (37.2 C) 99.7 F (37.6 C)  TempSrc: Oral  Oral Oral  SpO2: 95%  95% 95%  Weight:      Height:        Intake/Output Summary (Last 24 hours) at 09/12/2017 1022 Last data filed at 09/12/2017 4010 Gross per 24 hour  Intake 600 ml  Output 2200 ml  Net -1600 ml   Filed Weights   09/29/2017 1416  Weight: 68.9 kg (152 lb)    Examination:  General exam: Appears calm and comfortable  Respiratory system: Clear to auscultation. Respiratory effort normal. Cardiovascular system: Regular rate and rhythm, no murmurs Gastrointestinal system: Abdomen is nondistended, soft and nontender. No organomegaly or masses felt. Normal bowel sounds heard. Central nervous system: Alert and oriented. No focal neurological deficits. Extremities: No lower extremity edema Skin: No rashes Psychiatry: Judgement and insight appear normal. Mood & affect appropriate.     Data Reviewed: I have  personally reviewed following labs and imaging studies  CBC: Recent Labs  Lab 09/25/2017 1425 09/13/2017 1919  09/07/17 0340 09/08/17 0400 09/09/17 0401 09/11/17 0429 09/12/17 0459  WBC 2.0* 2.7*  --  2.8* 2.8* 3.4* 3.3* 5.5  NEUTROABS 0.6* 0.8*  --  0.8*  --   --   --  0.9*  HGB 8.3* 5.3*   < > 11.6*  11.6* 12.1 11.6* 10.6*  HCT 25.6* 16.3*   < > 34.2* 35.8* 37.5 36.3 33.1*  MCV 99.2 99.4  --  90.7 93.0 94.2 94.0 95.7  PLT 52* 61*   < > 34* 36* 30* 23* 23*   < > = values in this interval not displayed.   Basic Metabolic Panel: Recent Labs  Lab 09/10/2017 1425 09/07/17 0340 09/09/17 0401 09/11/17 0429 09/12/17 0459  NA 142 138 140 137 138  K 3.6 3.7 4.2 4.4 3.9  CL 107 105 103 100* 104  CO2 '26 27 30 30 27  '$ GLUCOSE 116* 131* 119* 124* 148*  BUN '10 10 18 '$ 26* 14  CREATININE 0.95 0.97 1.07* 1.20* 1.05*  CALCIUM 8.4* 8.4* 8.6* 8.7* 8.1*  MG  --   --   --  2.3  --   PHOS  --   --   --   --  2.0*   GFR: Estimated Creatinine Clearance: 40.1 mL/min (A) (by C-G formula based on SCr of 1.05 mg/dL (H)). Liver Function Tests: Recent Labs  Lab 09/13/2017 1425 09/09/17 0401  AST 16 20  ALT 10* 11*  ALKPHOS 66 56  BILITOT 0.8 1.1  PROT 7.4 7.5  ALBUMIN 3.5 3.2*   Recent Labs  Lab 09/21/2017 1425  LIPASE 22   No results for input(s): AMMONIA in the last 168 hours. Coagulation Profile: Recent Labs  Lab 09/16/2017 1919  INR 1.05   Cardiac Enzymes: No results for input(s): CKTOTAL, CKMB, CKMBINDEX, TROPONINI in the last 168 hours. BNP (last 3 results) No results for input(s): PROBNP in the last 8760 hours. HbA1C: No results for input(s): HGBA1C in the last 72 hours. CBG: No results for input(s): GLUCAP in the last 168 hours. Lipid Profile: No results for input(s): CHOL, HDL, LDLCALC, TRIG, CHOLHDL, LDLDIRECT in the last 72 hours. Thyroid Function Tests: No results for input(s): TSH, T4TOTAL, FREET4, T3FREE, THYROIDAB in the last 72 hours. Anemia Panel: No results for input(s): VITAMINB12, FOLATE, FERRITIN, TIBC, IRON, RETICCTPCT in the last 72 hours. Sepsis Labs: Recent Labs  Lab 09/10/17 0859 09/11/17 0429 09/12/17 0459  PROCALCITON 0.15 0.28 0.44    Recent Results (from the past 240 hour(s))  Culture, blood (Routine X 2) w Reflex to ID Panel     Status: None    Collection Time: 09/10/2017  7:19 PM  Result Value Ref Range Status   Specimen Description   Final    BLOOD RIGHT HAND Performed at [facility], [place] [address] [place], [place] [address]    Special Requests   Final    BOTTLES DRAWN AEROBIC ONLY Blood Culture adequate volume Performed at [place] [address] [place], [place] [address]    Culture   Final    NO GROWTH 5 DAYS Performed at [facility], [place] [address] [place],  [address]    Report Status 09/11/2017 FINAL  Final  Culture, blood (Routine X 2) w Reflex to ID Panel     Status: None   Collection Time: 09/30/2017  7:19 PM  Result Value Ref  Range Status   Specimen Description   Final    BLOOD LEFT HAND Performed at [facility] [address], [place], [place] [address]    Special Requests   Final    BOTTLES DRAWN AEROBIC ONLY Blood Culture adequate volume Performed at [facility] [address] [place], [place] [address]    Culture   Final    NO GROWTH 5 DAYS Performed at [facility], [place] [address], [place], [place] [address]    Report Status 09/11/2017 FINAL  Final  Culture, blood (routine x 2)     Status: None (Preliminary result)   Collection Time: 09/10/17  8:45 AM  Result Value Ref Range Status   Specimen Description BLOOD LEFT ARM  Final   Special Requests   Final    BOTTLES DRAWN AEROBIC ONLY Blood Culture adequate volume   Culture   Final    NO GROWTH < 24 HOURS Performed at [facility], [place] [address], [place], [place] [address]    Report Status PENDING  Incomplete  Culture, blood (routine x 2)     Status: None (Preliminary result)   Collection Time: 09/10/17  8:58 AM  Result Value Ref Range Status   Specimen Description BLOOD LEFT ARM  Final   Special Requests   Final    BOTTLES DRAWN AEROBIC AND ANAEROBIC Blood Culture adequate volume   Culture   Final    NO GROWTH < 24 HOURS Performed  at [facility], [place] [address], [place], [place] [address]    Report Status PENDING  Incomplete         Radiology Studies: Dg Chest Port 1 View  Result Date: 09/12/2017 CLINICAL DATA:  83 year old female with a history of fever EXAM: PORTABLE CHEST 1 VIEW COMPARISON:  09/16/2017 FINDINGS: Cardiomediastinal silhouette unchanged in size and contour. No evidence of central vascular congestion. No pneumothorax or pleural effusion. No confluent airspace disease. No acute displaced fracture. IMPRESSION: Negative for acute cardiopulmonary disease Electronically Signed   By: Corrie Mckusick D.O.   On: 09/12/2017 08:30        Scheduled Meds: . allopurinol  100 mg Oral Daily  . antiseptic oral rinse  15 mL Mouth Rinse Q4H  . ketorolac  1 drop Both Eyes QID  . nutrition supplement (JUVEN)  1 packet Oral BID BM  . polyethylene glycol  17 g Oral BID  . prednisoLONE acetate  1 drop Both Eyes QID   Continuous Infusions: . ceFEPime (MAXIPIME) IV Stopped (09/12/17 2763)     LOS: 6 days    Time spent: [place], MD [facility]   If 7PM-7AM, please contact night-coverage [email or web address] Password [email or web address] 09/12/2017, 10:22 AM

## 2017-09-12 NOTE — Progress Notes (Signed)
*  Preliminary Results* Right upper extremity venous duplex completed. Right upper extremity is negative for deep and superficial vein thrombosis.  09/12/2017 3:49 PM  Maudry Mayhew, BS, RVT, RDCS, RDMS

## 2017-09-12 NOTE — Progress Notes (Signed)
Physical Therapy Treatment Patient Details Name: Leah Lewis MRN: [id number] DOB: [birth year] Today's Date: 09/12/2017    History of Present Illness 83 y.o. female with medical history significant of hypertension, prior stroke hx of left DVT.  Patient presented to the emergency department complaining of generalized weakness and fatigue. Dx of fever,anemia.  New diagnosis of AML-oncology following    PT Comments    Pt agreeable to ROM exercises in bed for UE/LEs but she politely declined OOB on today. Per pt and family report, pt has been having loose stools. Will check back another day to resume OOB activity.    Follow Up Recommendations  Home health PT;Supervision/Assistance - 24 hour     Equipment Recommendations  None recommended by PT    Recommendations for Other Services       Precautions / Restrictions Precautions Precautions: Other (comment);Fall Precaution Comments: reports no h/o falls in past 1 year; pt is legally blind but can see objects Restrictions Weight Bearing Restrictions: No    Mobility  Bed Mobility                  Transfers                    Ambulation/Gait                 Stairs             Wheelchair Mobility    Modified Rankin (Stroke Patients Only)       Balance                                            Cognition                                              Exercises General Exercises - Upper Extremity Shoulder Flexion: AROM;Strengthening;10 reps;Supine Elbow Flexion: AAROM;AROM;Both;10 reps;Supine General Exercises - Lower Extremity Ankle Circles/Pumps: AROM;Both;10 reps;Supine Quad Sets: AROM;Both;10 reps;Supine Heel Slides: AAROM;Both;10 reps;Supine Hip ABduction/ADduction: AAROM;Both;10 reps;Supine    General Comments        Pertinent Vitals/Pain      Home Living                      Prior Function            PT Goals (current  goals can now be found in the care plan section) Progress towards PT goals: Progressing toward goals    Frequency    Min 3X/week      PT Plan Current plan remains appropriate    Co-evaluation              AM-PAC PT "6 Clicks" Daily Activity  Outcome Measure  Difficulty turning over in bed (including adjusting bedclothes, sheets and blankets)?: A Little Difficulty moving from lying on back to sitting on the side of the bed? : A Little Difficulty sitting down on and standing up from a chair with arms (e.g., wheelchair, bedside commode, etc,.)?: A Little Help needed moving to and from a bed to chair (including a wheelchair)?: A Little Help needed walking in hospital room?: A Little Help needed climbing 3-5 steps with a railing? : A Lot 6 Click Score: 17    End  of Session   Activity Tolerance: Other (comment)(Limited by diarrhea) Patient left: with call bell/phone within reach;with family/visitor present;in bed   PT Visit Diagnosis: Difficulty in walking, not elsewhere classified (R26.2)     Time: 2158-7276 PT Time Calculation (min) (ACUTE ONLY): 11 min  Charges:  $Therapeutic Exercise: 8-22 mins                    G Codes:          Weston Anna, MPT Pager: [phone]

## 2017-09-12 NOTE — Care Management Important Message (Signed)
Important Message  Patient Details  Name: Leah Lewis MRN: [id number] Date of Birth: 1934-11-23   Medicare Important Message Given:  Yes    Kerin Salen 09/12/2017, 11:34 AMImportant Message  Patient Details  Name: Leah Lewis MRN: [id number] Date of Birth: 01-12-1935   Medicare Important Message Given:  Yes    Kerin Salen 09/12/2017, 11:34 AM

## 2017-09-12 NOTE — Progress Notes (Addendum)
Referring Physician(s): Ennever,P  Supervising Physician: Corrie Mckusick  Patient Status:  Leah Lewis - In-pt  Chief Complaint:  leukemia  Subjective: Patient familiar to IR service from recent bone marrow biopsy on 09/07/2017.  She has a known history of hyperlipidemia, hypertension, prior left lower extremity DVT, prior CVA, osteoporosis and recently diagnosed AML.  She does have some intermittent fevers but currently negative blood cultures.  Urine culture pending.  Request now received from oncology for Port-A-Cath placement.  Patient is thrombocytopenic and is planning to receive platelet infusion today. Additional hx as below.  She currently denies headache, chest pain, dyspnea, cough, abdominal/back pain, nausea, vomiting or bleeding.  She does have a dry mouth.  She has some mild discomfort in the right arm at prior venipuncture site. Past Medical History:  Diagnosis Date  . AML (acute myeloid leukemia) ([place]) 09/12/2017  . Goals of care, counseling/discussion 09/12/2017  . High cholesterol   . Hypertension   . Stroke [facility])    Past Surgical History:  Procedure Laterality Date  . ABDOMINAL HYSTERECTOMY    . CATARACT EXTRACTION     bilateral  . CESAREAN SECTION        Allergies: Percocet [oxycodone-acetaminophen]  Medications: Prior to Admission medications   Medication Sig Start Date End Date Taking? Authorizing Provider  alendronate (FOSAMAX) 70 MG tablet Take 70 mg by mouth once a week. 05/20/12  Yes [provider]  aspirin EC 81 MG tablet Take 81 mg by mouth 3 (three) times a week.   Yes [provider]  atorvastatin (LIPITOR) 40 MG tablet Take 40 mg by mouth daily.     Yes [provider]  ketorolac (ACULAR) 0.5 % ophthalmic solution Place 1 drop into both eyes 4 (four) times daily.     Yes [provider]  pantoprazole (PROTONIX) 40 MG tablet Take 1 tablet (40 mg total) by mouth daily. Patient taking differently: Take 40 mg by mouth  daily as needed (heartburn).  03/04/15  Yes Robbie Lis, MD  prednisoLONE acetate (PRED FORTE) 1 % ophthalmic suspension Place 1 drop into both eyes 4 (four) times daily.     Yes [provider]  Vitamin D, Ergocalciferol, (DRISDOL) 50000 UNITS CAPS capsule Take 50,000 Units by mouth every 30 (thirty) days.   Yes [provider]  Rivaroxaban (XARELTO STARTER PACK) 15 & 20 MG TBPK Take as directed on package: Start with one '15mg'$  tablet by mouth twice a day with food. On Day 22, switch to one '20mg'$  tablet once a day with food. Patient not taking: Reported on 09/09/2017 03/04/15   Robbie Lis, MD     Vital Signs: BP (!) 99/49 (BP Location: Right Arm)   Pulse 86   Temp 99.7 F (37.6 C) (Oral)   Resp (!) 24   Ht '5\' 4"'$  (1.626 m)   Wt 152 lb (68.9 kg)   SpO2 95%   BMI 26.09 kg/m   Physical Exam awake, alert.  Chest clear to auscultation bilaterally.  Heart with regular rate and rhythm.  Abdomen soft, positive bowel sounds, nontender.  No significant lower extremity edema.  Imaging: Dg Chest Port 1 View  Result Date: 09/12/2017 CLINICAL DATA:  83 year old female with a history of fever EXAM: PORTABLE CHEST 1 VIEW COMPARISON:  09/23/2017 FINDINGS: Cardiomediastinal silhouette unchanged in size and contour. No evidence of central vascular congestion. No pneumothorax or pleural effusion. No confluent airspace disease. No acute displaced fracture. IMPRESSION: Negative for acute cardiopulmonary disease Electronically Signed  By: Corrie Mckusick D.O.   On: 09/12/2017 08:30    Labs:  CBC: Recent Labs    09/08/17 0400 09/09/17 0401 09/11/17 0429 09/12/17 0459  WBC 2.8* 3.4* 3.3* 5.5  HGB 11.6* 12.1 11.6* 10.6*  HCT 35.8* 37.5 36.3 33.1*  PLT 36* 30* 23* 23*    COAGS: Recent Labs    09/03/2017 1919  INR 1.05    BMP: Recent Labs    09/07/17 0340 09/09/17 0401 09/11/17 0429 09/12/17 0459  NA 138 140 137 138  K 3.7 4.2 4.4 3.9  CL 105 103 100* 104  CO2 '27 30  30 27  '$ GLUCOSE 131* 119* 124* 148*  BUN 10 18 26* 14  CALCIUM 8.4* 8.6* 8.7* 8.1*  CREATININE 0.97 1.07* 1.20* 1.05*  GFRNONAA 53* 47* 41* 48*  GFRAA >60 55* 48* 56*    LIVER FUNCTION TESTS: Recent Labs    09/23/2017 1425 09/09/17 0401  BILITOT 0.8 1.1  AST 16 20  ALT 10* 11*  ALKPHOS 66 56  PROT 7.4 7.5  ALBUMIN 3.5 3.2*    Assessment and Plan: Pt with known history of hyperlipidemia, hypertension, prior left lower extremity DVT, prior CVA, osteoporosis and recently diagnosed AML.  She does have some intermittent fevers but currently negative blood cultures.  Urine culture pending.  Request now received from oncology for Port-A-Cath placement.  Patient is thrombocytopenic and is planning to receive platelet infusion today. Risks and benefits of image guided port-a-catheter placement was discussed with the patient/daughter including, but not limited to bleeding, infection, pneumothorax, or fibrin sheath development and need for additional procedures.  All of the patient's questions were answered, patient is agreeable to proceed. Consent signed and in chart.  Procedure tent planned for 6/11.  We will plan to recheck labs in a.m. and if necessary administer additional platelets prior to procedure.She is on IV cefepime.   Electronically Signed: D. Rowe Robert, PA-C 09/12/2017, 11:36 AM   I spent a total of 30 minutes  at the the patient's bedside AND on the patient's hospital floor or unit, greater than 50% of which was counseling/coordinating care for Port-A-Cath placement    Patient ID: Leah Lewis, female   DOB: 10-26-1934, 83 y.o.   MRN: [id number]

## 2017-09-12 NOTE — Progress Notes (Addendum)
The bone marrow report came back late Friday.  The report shows acute myeloid leukemia.  She is really borderline between AML and myelodysplasia.  It is reported that she has 11% blast cells by flow cytometry.  I do not have back the cytogenetics.  I really cannot run the molecular assays while she is in the hospital.  I talked to her today about this.  She is still having some temperatures.  It is possible that the temperatures are from the leukemia itself.  However, this would be a little unusual.  She is on Maxipime.  Cultures were taken.  I am unsure why she has the Foley catheter in.  I would see about removing this.  Again, at her age, she is not a candidate for aggressive induction therapy.  I would favor however that she could tolerate HMA based therapy.  I think this would be very reasonable.  I probably would favor decitabine.  She will need to have a Port-A-Cath in.  I explained this to her.  She is agreeable.  She is complaining of a dry mouth.  I will try her on some Biotene mouth rinse.  I will also consider allopurinol for her.  She seems to be eating decently.  There is no nausea or vomiting.  The real key as for her prognosis is going to be the chromosome abnormalities.  This probably will not come back for another week.  In addition, getting the mutation analysis for FLT3, NPM1, IDH1/2, will really help drive her prognosis.  A nice clinical trial came out that incorporated venetoclax with the decitabine.  I think this would be very interesting.  Whether we use Venetoclax I think will depend upon the cytogenetics and the mutation analysis.  She does have a little bit of swelling in the right arm.  I will check a Doppler to make sure there is no thrombus.  I realize that she has thrombocytopenia.  We may have to give her platelets prior to a Port-A-Cath.  Hopefully, she we will have the Port-A-Cath placed tomorrow.  I would then see about trying to start treatment on  Wednesday.  Leah Haw, MD  2 Thessalonians 3:13  ADDENDUM: I spoke with 2 of her daughters.  I explained to them what the diagnosis was.  I explained to them that this is a situation that we are not going to cure.  Our goal would be to try to improve her blood counts so that she will feel better and that she will not need to be transfused and that she we will have a normal risk of infection, bleeding, etc.  I told him that if there was no treatment, then Leah Lewis probably will not live more than 3 or 4 months.  If treatment does work, then I think we could get her more than a year.  Again, I told her daughters that we are not going to cure this.  Our goal is clearly to try to help her quality of life and that is done by making her blood counts better.  The have a good understanding of the situation.  I answered all of their questions.  It was very nice talking with them.  I just want to make sure that everybody knows what the problem is and what are goal is in treatment.  It is conceivable that Leah Lewis will not want to have any therapy.  If that is the case, I would definitely get hospice involved.  Leah Haw,  MD 

## 2017-09-12 NOTE — Progress Notes (Signed)
Pharmacy Antibiotic Note  Leah Lewis is a 83 y.o. female admitted on 09/09/2017 with febrile neutropenia.  Pharmacy has been consulted for vancomycin dosing. Patient already on cefepime.  Vancomycin being added for ongoing fevers, may be related to leukemia. Previous culture results are unrevealing. No obvious source of infection .   Today, 09/12/2017 Day #3 cefepime.  WBC WNL (ANC = 900)  Renal: SCr slightly elevated  + fevers - repeat BCx ordered   Plan:  Vancomycin 1500mg  x1 then 750mg  IV q24h  Continue cefepime 2gm IV q12h  Daily SCr at this time due to slight elevation  Check levels if remains on vancomucin > 4 days  Follow-up ability to narrow antibiotics.    Height: 5\' 4"  (162.6 cm) Weight: 152 lb (68.9 kg) IBW/kg (Calculated) : 54.7  Temp (24hrs), Avg:100.8 F (38.2 C), Min:98.9 F (37.2 C), Max:103.3 F (39.6 C)  Recent Labs  Lab 09/04/2017 1425  09/07/17 0340 09/08/17 0400 09/09/17 0401 09/11/17 0429 09/12/17 0459  WBC 2.0*   < > 2.8* 2.8* 3.4* 3.3* 5.5  CREATININE 0.95  --  0.97  --  1.07* 1.20* 1.05*   < > = values in this interval not displayed.    Estimated Creatinine Clearance: 40.1 mL/min (A) (by C-G formula based on SCr of 1.05 mg/dL (H)).    Allergies  Allergen Reactions  . Percocet [Oxycodone-Acetaminophen] Other (See Comments)    "patient can take" but prefers not to    Antimicrobials this admission: 6/8 cefepime >> 6/10 vancomycin >>   Dose adjustments this admission:  Microbiology results: 6/3 BCx: NGTD 6/8 BCx: NGTD  6/10 BCx:   Thank you for allowing pharmacy to be a part of this patient's care.  Doreene Eland, PharmD, BCPS.   Pager: [phone] 09/12/2017 1:01 PM

## 2017-09-12 NOTE — Progress Notes (Signed)
Patient to transfer to step down per MD order. Report called to receiving RN.

## 2017-09-13 ENCOUNTER — Inpatient Hospital Stay (HOSPITAL_COMMUNITY): Payer: Medicare Other

## 2017-09-13 LAB — CBC WITH DIFFERENTIAL/PLATELET
Basophils Absolute: 0 K/uL (ref 0.0–0.1)
Basophils Relative: 0 %
Blasts: 3 %
Eosinophils Absolute: 0 10*3/uL (ref 0.0–0.7)
Eosinophils Relative: 0 %
HCT: 32.8 % — ABNORMAL LOW (ref 36.0–46.0)
Hemoglobin: 10.4 g/dL — ABNORMAL LOW (ref 12.0–15.0)
Lymphocytes Relative: 16 %
Lymphs Abs: 1 10*3/uL (ref 0.7–4.0)
MCH: 30.3 pg (ref 26.0–34.0)
MCHC: 31.7 g/dL (ref 30.0–36.0)
MCV: 95.6 fL (ref 78.0–100.0)
Monocytes Absolute: 4.6 K/uL (ref 0.1–1.0)
Monocytes Relative: 70 %
Neutro Abs: 0.9 10*3/uL (ref 1.7–7.7)
Neutrophils Relative %: 14 %
Platelets: 111 K/uL — ABNORMAL LOW (ref 150–400)
RBC: 3.43 MIL/uL — ABNORMAL LOW (ref 3.87–5.11)
RDW: 18.5 % — ABNORMAL HIGH (ref 11.5–15.5)
WBC: 6.5 10*3/uL (ref 4.0–10.5)

## 2017-09-13 LAB — COMPREHENSIVE METABOLIC PANEL WITH GFR
Albumin: 2.5 g/dL — ABNORMAL LOW (ref 3.5–5.0)
Anion gap: 8 (ref 5–15)
GFR calc non Af Amer: 47 mL/min — ABNORMAL LOW (ref >60)
Potassium: 3.6 mmol/L (ref 3.5–5.1)
Total Bilirubin: 0.7 mg/dL (ref 0.3–1.2)
Total Protein: 6.6 g/dL (ref 6.5–8.1)

## 2017-09-13 LAB — BPAM PLATELET PHERESIS
Blood Product Expiration Date: 201906112359
Blood Product Expiration Date: 201906112359
ISSUE DATE / TIME: 201906101614
ISSUE DATE / TIME: 201906101738
Unit Type and Rh: 600
Unit Type and Rh: 6200

## 2017-09-13 LAB — PREPARE PLATELET PHERESIS
Unit division: 0
Unit division: 0

## 2017-09-13 LAB — COMPREHENSIVE METABOLIC PANEL
ALT: 20 U/L (ref 14–54)
AST: 32 U/L (ref 15–41)
Alkaline Phosphatase: 52 U/L (ref 38–126)
BUN: 14 mg/dL (ref 6–20)
CO2: 27 mmol/L (ref 22–32)
Calcium: 8.1 mg/dL — ABNORMAL LOW (ref 8.9–10.3)
Chloride: 106 mmol/L (ref 101–111)
Creatinine, Ser: 1.07 mg/dL — ABNORMAL HIGH (ref 0.44–1.00)
GFR calc Af Amer: 55 mL/min — ABNORMAL LOW (ref >60)
Glucose, Bld: 147 mg/dL — ABNORMAL HIGH (ref 65–99)
Sodium: 141 mmol/L (ref 135–145)

## 2017-09-13 LAB — PROTIME-INR
INR: 1.3
Prothrombin Time: 16 s — ABNORMAL HIGH (ref 11.4–15.2)

## 2017-09-13 LAB — MAGNESIUM: Magnesium: 1.9 mg/dL (ref 1.7–2.4)

## 2017-09-13 MED ORDER — LIDOCAINE HCL 1 % IJ SOLN
INTRAMUSCULAR | Status: AC | PRN
  Administered 2017-09-13: 5 mL via INTRADERMAL

## 2017-09-13 MED ORDER — ADULT MULTIVITAMIN W/MINERALS CH
1.0000 | ORAL_TABLET | Freq: Every day | ORAL | Status: DC
  Administered 2017-09-14 – 2017-10-11 (×24): 1 via ORAL
  Filled 2017-09-13 (×27): qty 1

## 2017-09-13 MED ORDER — LIDOCAINE HCL 1 % IJ SOLN
INTRAMUSCULAR | Status: AC
  Filled 2017-09-13: qty 20

## 2017-09-13 MED ORDER — COLLAGENASE 250 UNIT/GM EX OINT
TOPICAL_OINTMENT | Freq: Every day | CUTANEOUS | Status: DC
  Administered 2017-09-13 – 2017-09-16 (×4): via TOPICAL
  Administered 2017-09-17: 1 via TOPICAL
  Administered 2017-09-18: 10:00:00 via TOPICAL
  Administered 2017-09-19 – 2017-09-20 (×2): 1 via TOPICAL
  Administered 2017-09-21 – 2017-10-05 (×14): via TOPICAL
  Administered 2017-10-06: 1 via TOPICAL
  Administered 2017-10-07 – 2017-10-09 (×3): via TOPICAL
  Administered 2017-10-10: 1 via TOPICAL
  Filled 2017-09-13: qty 30
  Filled 2017-09-13: qty 90
  Filled 2017-09-13 (×2): qty 30

## 2017-09-13 MED ORDER — LIP MEDEX EX OINT
TOPICAL_OINTMENT | CUTANEOUS | Status: AC
  Administered 2017-09-13: 10:00:00
  Filled 2017-09-13: qty 7

## 2017-09-13 NOTE — Progress Notes (Signed)
Nutrition Follow-up  DOCUMENTATION CODES:   Not applicable  INTERVENTION:  - Continue Juven BID and Ensure Enlive BID.  - Will order daily multivitamin with minerals.  - Continue to encourage PO intakes.  - Will order for new weight to be obtained today.   NUTRITION DIAGNOSIS:   Increased nutrient needs related to wound healing as evidenced by estimated needs. -ongoing  GOAL:   Patient will meet greater than or equal to 90% of their needs -unmet recently  MONITOR:   PO intake, Supplement acceptance, Labs, Weight trends, Skin, I & O's  ASSESSMENT:   83 y.o. female with medical history significant of hypertension, prior stroke hx of left DVT.  Patient presented to the emergency department complaining of generalized weakness and fatigue.   No weight since admission on 6/3. NFPE done today and does not show any muscle or fat wasting at this time. Patient was mainly eating 75-100% of meals until the afternoon of 6/8; since that time she has been eating 25-50% of meals. Patient denies eating breakfast this AM and denies abdominal pain or nausea. She states she simply is not feeling hungry. She reports feeling very thirsty; RD provided pt with a cup of water per her request and patient drank the full cup while RD was present.   Plans for Port-a-cath placement for initiation of chemo 2/2 recent dx of acute myeloid leukemia (AML) following bone marrow biopsy on 6/5. Dr. Marin Olp is following patient.      Medications reviewed; 1 packet Miralax BID.  Labs reviewed; creatinine: 1.07 mg/dL, Ca: 8.1 mg/dL, GFR: 55 mL/min.    Diet Order:   Diet Order           Diet Heart Room service appropriate? Yes; Fluid consistency: Thin  Diet effective now          EDUCATION NEEDS:   Not appropriate for education at this time  Skin:  Skin Assessment: Skin Integrity Issues: Skin Integrity Issues:: Stage II Stage II: coccyx  Last BM:  6/11  Height:   Ht Readings from Last 1 Encounters:   09/06/17 '5\' 4"'$  (1.626 m)    Weight:   Wt Readings from Last 1 Encounters:  09/04/2017 152 lb (68.9 kg)    Ideal Body Weight:  54.5 kg  BMI:  Body mass index is 26.09 kg/m.  Estimated Nutritional Needs:   Kcal:  1550-1750  Protein:  60-70g  Fluid:  1.7L/day    Jarome Matin, MS, RD, LDN, CNSC Inpatient Clinical Dietitian Pager # [phone] After hours/weekend pager # [phone]

## 2017-09-13 NOTE — Procedures (Signed)
Interventional Radiology Procedure Note  Procedure: Placement of a right basilic vein PICC, DL, 59YT.  Tip is positioned at the superior cavoatrial junction and catheter is ready for immediate use.  Complications: None Recommendations:  - Ok to use - Do not submerge  - Routine line care - Will need a Port in the near future as more durable access   Signed,  Dulcy Fanny. Earleen Newport, DO

## 2017-09-13 NOTE — Progress Notes (Signed)
PROGRESS NOTE  Leah Lewis WUJ:[id number] DOB: 22-Aug-1934 DOA: 10/02/2017 PCP: Wenda Low, MD  HPI/Recap of past 24 hours: [name] is a 83 year old female with past medical history significant for osteoporosis, hyperlipidemia, DVT on Xarelto who presented to the ED with complaints of generalized weakness.  Pancytopenic and found to have AML.  Oncology following.  09/13/2017: Patient seen and examined at bedside she has no new complaints.  Febrile this morning with T-max of 102.9.  Blood cultures drawn yesterday still in process.  On broad-spectrum IV antibiotics and will continue.  MRI of sacrum ordered to rule out osteomyelitis.  Assessment/Plan: Active Problems:   Pressure injury of skin   AML (acute myeloid leukemia) (HCC)   Goals of care, counseling/discussion  Generalized weakness most likely secondary to AML versus others Oncology consulted and following PT evaluated and recommended home health PT and 24 hours assistance. Fall precautions  Fever of unclear source Continues to be febrile T-max of 102.9 this morning Continue IV cefepime and IV vancomycin MRI of sacrum ordered to rule out osteomyelitis  Unstageable sacral decubitus ulcer Wound care specialist consulted.  Highly appreciated recommendations. Management as stated above  Thrombocytopenia Platelet 111 K from 23k on 09/12/17 Repeat CBC in the morning  Pancytopenia most likely secondary to AML, improving Bone marrow biopsy on 09/07/2017 showed 10% blasts Oncology following Port-A-Cath placement by interventional radiology   Hypertension Blood pressures well stable  Hyperlipidemia Continue statin  History of DVT Previously on Xarelto Aspirin held due to thrombocytopenia Defer to heme oncology to restart oral anticoagulation  Osteoporosis Continue to hold alendronate Continue to hold vitamin D and calcium  Code Status: Full code  Family Communication: Daughter at bedside  Disposition Plan:  Home when clinically stable with home health PT   Consultants:  Oncology  Interventional radiology  Procedures:  Port-A-Cath placement plan today by interventional radiology   Antimicrobials:   IV cefepime IV vancomycin DVT prophylaxis: SCDs   Objective: Vitals:   09/13/17 1140 09/13/17 1200 09/13/17 1300 09/13/17 1400  BP:  (!) 101/44 (!) 116/45 (!) 131/50  Pulse:  81 82 82  Resp:  (!) 28 (!) 26 (!) 23  Temp: 99.6 F (37.6 C)     TempSrc: Oral     SpO2:  93% 91% 93%  Weight:      Height:        Intake/Output Summary (Last 24 hours) at 09/13/2017 1422 Last data filed at 09/13/2017 1100 Gross per 24 hour  Intake 1935.33 ml  Output 3528 ml  Net -1592.67 ml   Filed Weights   09/20/2017 1416 09/13/17 1100  Weight: 68.9 kg (152 lb) 67.5 kg (148 lb 13 oz)    Exam:  . General: 83 y.o. year-old female well developed well nourished in no acute distress.  Alert and interactive. . Cardiovascular: Regular rate and rhythm with no rubs or gallops.  No thyromegaly or JVD noted.   Marland Kitchen Respiratory: Clear to auscultation with no wheezes or rales. Good inspiratory effort. . Abdomen: Soft nontender nondistended with normal bowel sounds x4 quadrants. . Musculoskeletal: No lower extremity edema. 2/4 pulses in all 4 extremities. . Skin: Unstageable sacral decubitus ulcer . Psychiatry: Mood is appropriate for condition and setting   Data Reviewed: CBC: Recent Labs  Lab 09/07/17 0340  09/09/17 0401 09/11/17 0429 09/12/17 0459 09/12/17 2150 09/13/17 0309  WBC 2.8*   < > 3.4* 3.3* 5.5 3.9* 6.5  NEUTROABS 0.8*  --   --   --  0.9*  0.8 0.9  HGB 11.6*   < > 12.1 11.6* 10.6* 10.3* 10.4*  HCT 34.2*   < > 37.5 36.3 33.1* 32.1* 32.8*  MCV 90.7   < > 94.2 94.0 95.7 95.3 95.6  PLT 34*   < > 30* 23* 23* 114* 111*   < > = values in this interval not displayed.   Basic Metabolic Panel: Recent Labs  Lab 09/07/17 0340 09/09/17 0401 09/11/17 0429 09/12/17 0459 09/13/17 0309  NA  138 140 137 138 141  K 3.7 4.2 4.4 3.9 3.6  CL 105 103 100* 104 106  CO2 '27 30 30 27 27  '$ GLUCOSE 131* 119* 124* 148* 147*  BUN 10 18 26* 14 14  CREATININE 0.97 1.07* 1.20* 1.05* 1.07*  CALCIUM 8.4* 8.6* 8.7* 8.1* 8.1*  MG  --   --  2.3  --  1.9  PHOS  --   --   --  2.0*  --    GFR: Estimated Creatinine Clearance: 38.9 mL/min (A) (by C-G formula based on SCr of 1.07 mg/dL (H)). Liver Function Tests: Recent Labs  Lab 09/09/17 0401 09/13/17 0309  AST 20 32  ALT 11* 20  ALKPHOS 56 52  BILITOT 1.1 0.7  PROT 7.5 6.6  ALBUMIN 3.2* 2.5*   No results for input(s): LIPASE, AMYLASE in the last 168 hours. No results for input(s): AMMONIA in the last 168 hours. Coagulation Profile: Recent Labs  Lab 09/13/17 0309  INR 1.30   Cardiac Enzymes: No results for input(s): CKTOTAL, CKMB, CKMBINDEX, TROPONINI in the last 168 hours. BNP (last 3 results) No results for input(s): PROBNP in the last 8760 hours. HbA1C: No results for input(s): HGBA1C in the last 72 hours. CBG: No results for input(s): GLUCAP in the last 168 hours. Lipid Profile: No results for input(s): CHOL, HDL, LDLCALC, TRIG, CHOLHDL, LDLDIRECT in the last 72 hours. Thyroid Function Tests: No results for input(s): TSH, T4TOTAL, FREET4, T3FREE, THYROIDAB in the last 72 hours. Anemia Panel: No results for input(s): VITAMINB12, FOLATE, FERRITIN, TIBC, IRON, RETICCTPCT in the last 72 hours. Urine analysis:    Component Value Date/Time   COLORURINE YELLOW 09/06/2017 0024   APPEARANCEUR HAZY (A) 09/06/2017 0024   LABSPEC 1.008 09/06/2017 0024   PHURINE 7.0 09/06/2017 0024   GLUCOSEU NEGATIVE 09/06/2017 0024   HGBUR NEGATIVE 09/06/2017 0024   BILIRUBINUR NEGATIVE 09/06/2017 0024   KETONESUR NEGATIVE 09/06/2017 0024   PROTEINUR NEGATIVE 09/06/2017 0024   UROBILINOGEN 1.0 05/31/2010 2325   NITRITE NEGATIVE 09/06/2017 0024   LEUKOCYTESUR SMALL (A) 09/06/2017 0024   Sepsis  Labs: '@LABRCNTIP'$ (procalcitonin:4,lacticidven:4)  ) Recent Results (from the past 240 hour(s))  Culture, blood (Routine X 2) w Reflex to ID Panel     Status: None   Collection Time: 09/23/2017  7:19 PM  Result Value Ref Range Status   Specimen Description   Final    BLOOD RIGHT HAND Performed at [facility], [place] [address], [place], [place] [address]    Special Requests   Final    BOTTLES DRAWN AEROBIC ONLY Blood Culture adequate volume Performed at [facility] [address] [place], [place] [address]    Culture   Final    NO GROWTH 5 DAYS Performed at [facility], [place] [address], [place], [place] [address]    Report Status 09/11/2017 FINAL  Final  Culture, blood (Routine X 2) w Reflex to ID Panel     Status: None   Collection Time:  09/26/2017  7:19 PM  Result Value Ref Range Status   Specimen Description   Final    BLOOD LEFT HAND Performed at [facility] [address], [place], [place] [address]    Special Requests   Final    BOTTLES DRAWN AEROBIC ONLY Blood Culture adequate volume Performed at [facility] [address], [place], [place] [address]    Culture   Final    NO GROWTH 5 DAYS Performed at [facility], [place] [address], [place], [place] [address]    Report Status 09/11/2017 FINAL  Final  Culture, blood (routine x 2)     Status: None (Preliminary result)   Collection Time: 09/10/17  8:45 AM  Result Value Ref Range Status   Specimen Description BLOOD LEFT ARM  Final   Special Requests   Final    BOTTLES DRAWN AEROBIC ONLY Blood Culture adequate volume   Culture   Final    NO GROWTH 3 DAYS Performed at [facility], [place] [address] [place], [place] [address]    Report Status PENDING  Incomplete  Culture, blood (routine x 2)     Status: None (Preliminary result)   Collection Time: 09/10/17  8:58 AM  Result Value Ref Range Status   Specimen Description BLOOD  LEFT ARM  Final   Special Requests   Final    BOTTLES DRAWN AEROBIC AND ANAEROBIC Blood Culture adequate volume   Culture   Final    NO GROWTH 3 DAYS Performed at [facility], [address], [place], [place] [address]    Report Status PENDING  Incomplete  Culture, blood (routine x 2)     Status: None (Preliminary result)   Collection Time: 09/12/17 12:50 PM  Result Value Ref Range Status   Specimen Description   Final    BLOOD RIGHT ANTECUBITAL Performed at [facility] [address] [place], [place] [address]    Special Requests   Final    BOTTLES DRAWN AEROBIC ONLY Blood Culture adequate volume Performed at [facility] [address] [place], [place] [address]    Culture   Final    NO GROWTH 1 DAY Performed at [facility], [place] [address] [place], [place] [address]    Report Status PENDING  Incomplete  Culture, blood (routine x 2)     Status: None (Preliminary result)   Collection Time: 09/12/17 12:50 PM  Result Value Ref Range Status   Specimen Description   Final    BLOOD RIGHT HAND Performed at [facility] [address], [place], [place] [address]    Special Requests   Final    BOTTLES DRAWN AEROBIC AND ANAEROBIC Blood Culture adequate volume Performed at [facility] [address] [place], [place] [address]    Culture   Final    NO GROWTH 1 DAY Performed at [facility], [place] [address] [place], [place] [address]    Report Status PENDING  Incomplete  MRSA PCR Screening     Status: None   Collection Time: 09/12/17  3:38 PM  Result Value Ref Range Status   MRSA by PCR NEGATIVE NEGATIVE Final    Comment:        The GeneXpert MRSA Assay (FDA approved for NASAL specimens only), is one component of a comprehensive MRSA colonization surveillance program. It is not intended to diagnose MRSA infection nor to guide or  monitor treatment for MRSA  infections. Performed at [facility], [place] [address], [place], [place] [address]       Studies: No results found.  Scheduled Meds: . allopurinol  100 mg Oral Daily  . antiseptic oral rinse  15 mL Mouth Rinse Q4H  . collagenase   Topical Daily  . feeding supplement (ENSURE ENLIVE)  237 mL Oral BID BM  . ketorolac  1 drop Both Eyes QID  . multivitamin with minerals  1 tablet Oral Daily  . nutrition supplement (JUVEN)  1 packet Oral BID BM  . polyethylene glycol  17 g Oral BID  . prednisoLONE acetate  1 drop Both Eyes QID    Continuous Infusions: . sodium chloride Stopped (09/13/17 1024)  . ceFEPime (MAXIPIME) IV Stopped (09/13/17 2840)  . vancomycin       LOS: 7 days     Kayleen Memos, MD [facility] Pager [phone]  If 7PM-7AM, please contact night-coverage [email or web address] Password [email or web address] 09/13/2017, 2:22 PM

## 2017-09-13 NOTE — Consult Note (Addendum)
[facility] Nurse wound consult note Reason for Consult: Consult requested for sacrum pressure injury.  Pt is emaciated and was frequently incontinent before Purewick was applied to attempt to contain urine. It is difficult to keep wound from becoming soiled related to the close proximity to the rectum.  Wound type: Unstageable Pressure Injury POA: Yes Measurement: 3X1.5X.3cm Wound bed: 90% tightly adhered slough/eschar, 10% red Drainage (amount, consistency, odor) small amt tan drainage, no odor or fluctuance Periwound: Intact skin surrounding, loose peeling edges Dressing procedure/placement/frequency: Pt is on a low airloss bed to reduce pressure.  Santyl ointment to provide enzymatic debridement of nonviable tissue.  Discussed plan of care with patient, no family members present. If source of sepsis has not been determined and possible osteomyelitis is a concern, please consider an MRI to r/o. Please re-consult if further assistance is needed.  Thank-you,  Julien Girt MSN, [place], [place], [place], [place]

## 2017-09-13 NOTE — Progress Notes (Signed)
Leah Lewis  is now in the ICU.  Looks like that temperatures might be coming from these wounds that are on her backside.  I think that she must have been pretty immobile for a while for her to have developed these decubiti.  I do not think that she would have developed these in a matter of 1 week.  I definitely would have her on IV antibiotics.  She is on Maxipime and vancomycin.  I think this is a very good combination.  She is afebrile this morning.  She had platelets yesterday.  Platelet count is 114,000.  She is going for a Port-A-Cath today.  Hopefully, she will be able to start her Dacogen tomorrow.  The labs today her sodium is 141.  Potassium 3.6.  Creatinine 1.07.  Her albumin is 2.5.  Her white cell count is 6.5.  This is a little trouble some.  She has mostly monocytes which I suspect are her leukemic cells.  I am still awaiting the results back from the chromosome studies.  I spoke to Leah Lewis again about her chemotherapy.  I told her that there is no guarantee that this is going to work.  I says that even if it works, it will not cure the leukemia.  Our goal is to try to improve her blood counts so that she will feel better.  She does understand this.  As always, we had a very good prayer session today.  I appreciate all the wonderful care that she will be getting from everybody down in the ICU.  Leah Haw, MD  Psalms 91:6

## 2017-09-14 LAB — BASIC METABOLIC PANEL
Anion gap: 10 (ref 5–15)
BUN: 16 mg/dL (ref 6–20)
CO2: 27 mmol/L (ref 22–32)
Calcium: 8.1 mg/dL — ABNORMAL LOW (ref 8.9–10.3)
Chloride: 104 mmol/L (ref 101–111)
Creatinine, Ser: 1.01 mg/dL — ABNORMAL HIGH (ref 0.44–1.00)
GFR calc Af Amer: 59 mL/min — ABNORMAL LOW (ref >60)
GFR, EST NON AFRICAN AMERICAN: 51 mL/min — AB (ref >60)
Glucose, Bld: 171 mg/dL — ABNORMAL HIGH (ref 65–99)
POTASSIUM: 3.5 mmol/L (ref 3.5–5.1)
Sodium: 141 mmol/L (ref 135–145)

## 2017-09-14 LAB — CBC
HCT: 33.2 % — ABNORMAL LOW (ref 36.0–46.0)
Hemoglobin: 10.5 g/dL — ABNORMAL LOW (ref 12.0–15.0)
MCH: 30.1 pg (ref 26.0–34.0)
MCHC: 31.6 g/dL (ref 30.0–36.0)
MCV: 95.1 fL (ref 78.0–100.0)
PLATELETS: 68 10*3/uL — AB (ref 150–400)
RBC: 3.49 MIL/uL — AB (ref 3.87–5.11)
RDW: 18.8 % — AB (ref 11.5–15.5)
WBC: 7.2 10*3/uL (ref 4.0–10.5)

## 2017-09-14 MED ORDER — SODIUM CHLORIDE 0.9 % IV BOLUS
500.0000 mL | Freq: Once | INTRAVENOUS | Status: AC
  Administered 2017-09-14: 500 mL via INTRAVENOUS

## 2017-09-14 MED ORDER — SODIUM CHLORIDE 0.9% FLUSH: 10.0000 mL | INTRAVENOUS | Status: DC | PRN

## 2017-09-14 MED ORDER — CHLORHEXIDINE GLUCONATE CLOTH 2 % EX PADS
6.0000 | MEDICATED_PAD | Freq: Every day | CUTANEOUS | Status: DC
  Administered 2017-09-14 – 2017-09-22 (×9): 6 via TOPICAL

## 2017-09-14 MED ORDER — SODIUM CHLORIDE 0.9% FLUSH
10.0000 mL | Freq: Two times a day (BID) | INTRAVENOUS | Status: DC
  Administered 2017-09-14 – 2017-09-18 (×5): 10 mL
  Administered 2017-09-19: 20 mL
  Administered 2017-09-19: 10 mL
  Administered 2017-09-20: 20 mL
  Administered 2017-09-20 – 2017-10-10 (×29): 10 mL
  Administered 2017-10-10: 20 mL

## 2017-09-14 MED ORDER — HYDROCODONE-ACETAMINOPHEN 5-325 MG PO TABS
1.0000 | ORAL_TABLET | ORAL | Status: DC | PRN
  Administered 2017-09-14 – 2017-09-16 (×4): 2 via ORAL
  Administered 2017-09-20: 1 via ORAL
  Filled 2017-09-14 (×2): qty 2
  Filled 2017-09-14: qty 1
  Filled 2017-09-14: qty 2
  Filled 2017-09-14: qty 1
  Filled 2017-09-14 (×2): qty 2

## 2017-09-14 MED ORDER — SODIUM CHLORIDE 0.9 % IV SOLN
INTRAVENOUS | Status: DC
  Administered 2017-09-14 – 2017-09-17 (×3): via INTRAVENOUS

## 2017-09-14 NOTE — Progress Notes (Signed)
Ms. Martinique still having a tough time.  She is having some temperatures.  I am going to stop the allopurinol.  It seems like her temperatures may have gotten worse once I started the allopurinol.  We will have to check her uric acid.  She had a PICC line placed not a Port-A-Cath because of the temperatures.  They have so far, all of her cultures have been negative.  She does have the decubiti in her back.  These are being addressed.  We will hopefully start the Dacogen today.  I see that her white cell count is trending upward.  I think this is a clear indication that her leukemia is becoming more active.  Her white cell count is 7.2.  Hemoglobin 10.5.  Platelet count 68,000.  Currently, her temperature is 98.6.  Her blood pressure is 116/65.  Oxygen saturation is 98%.  I cannot find anything new on her physical exam.  Again, we want to get started with the Dacogen today.  She will get this daily for 5 days.  I know that the staff in the ICU are doing a fantastic job with her.  As always, we had a very good prayer session.  [name], MD  Darlyn Chamber 24:7

## 2017-09-14 NOTE — Progress Notes (Signed)
PROGRESS NOTE  Leah Lewis ENI:[id number] DOB: 1934-07-21 DOA: 09/24/2017 PCP: Wenda Low, MD  HPI/Recap of past 24 hours: [name] is a 83 year old female with past medical history significant for osteoporosis, hyperlipidemia, DVT on Xarelto who presented to the ED with complaints of generalized weakness.  Pancytopenic and found to have AML.  Oncology following.  09/13/2017: Patient seen and examined at bedside she has no new complaints.  Febrile this morning with T-max of 102.9.  Blood cultures drawn yesterday still in process.  On broad-spectrum IV antibiotics and will continue.  MRI of sacrum ordered to rule out osteomyelitis.  09/14/2017: Patient seen and examined with her daughter at bedside.  No new complaints.  Will start chemotherapy treatment tomorrow 09/15/2017.  Assessment/Plan: Active Problems:   Pressure injury of skin   AML (acute myeloid leukemia) (HCC)   Goals of care, counseling/discussion  Generalized weakness most likely secondary to AML versus others Oncology consulted and following PT evaluated and recommended home health PT and 24 hours assistance. Fall precautions Encourage increasing p.o. calorie intake Continue Ensure and oral supplements  Persistent fever most likely related to her leukemia No clear source of active infection T-max of 102.7  last night Continue broad-spectrum IV antibiotics IV cefepime and IV vancomycin MRI of sacrum ordered to rule out osteomyelitis-no clear sign of osteomyelitis.  Unstageable sacral decubitus ulcer, appears stable Continue IV antibiotics Wound care specialist consulted.  Highly appreciated recommendations. Management as stated above  Thrombocytopenia post platelet transfusion Platelet dropped from 111K to 68k this morning Platelet 111 K from 23k on 09/12/17 Repeat CBC in the morning  Pancytopenia most likely secondary to AML, improving Bone marrow biopsy on 09/07/2017 showed 10% blasts Oncology  following PICC line placed by interventional radiology Did not get a Port-A-Cath placement due to persistent fevers  Hypertension Blood pressures well stable  Hyperlipidemia Continue statin  History of DVT Previously on Xarelto Aspirin held due to thrombocytopenia Defer to heme oncology to restart oral anticoagulation  Osteoporosis Continue to hold alendronate Continue to hold vitamin D and calcium  Code Status: Full code  Family Communication: Daughter at bedside  Disposition Plan: Home when clinically stable with home health PT   Consultants:  Oncology  Interventional radiology  Procedures:  Port-A-Cath placement plan today by interventional radiology   Antimicrobials: IV cefepime IV vancomycin DVT prophylaxis: SCDs   Objective: Vitals:   09/14/17 1100 09/14/17 1200 09/14/17 1300 09/14/17 1400  BP: (!) 115/53 109/71 (!) 80/36 (!) 104/47  Pulse: (!) 121 (!) 131 97 (!) 109  Resp: (!) 30 (!) 28 (!) 30 (!) 29  Temp:    98.5 F (36.9 C)  TempSrc:    Oral  SpO2: 98% 96% 96% 96%  Weight:      Height:        Intake/Output Summary (Last 24 hours) at 09/14/2017 1415 Last data filed at 09/14/2017 1400 Gross per 24 hour  Intake 510 ml  Output 1600 ml  Net -1090 ml   Filed Weights   09/18/2017 1416 09/13/17 1100  Weight: 68.9 kg (152 lb) 67.5 kg (148 lb 13 oz)    Exam:  . General: 83 y.o. year-old female well-developed well-nourished in no acute distress.  Alert and answers to questions appropriately.  Cardiovascular: Regular rate and rhythm with no rubs or gallops.  No thyromegaly or JVD noted.   Marland Kitchen Respiratory: Clear to auscultation with no wheezes or rales.  Poor inspiratory effort.  Abdomen: Soft nontender nondistended with normal bowel sounds  x4 quadrants. . Musculoskeletal: No lower extremity edema. 2/4 pulses in all 4 extremities. . Skin: Unstageable sacral decubitus ulcer . Psychiatry: Mood is appropriate for condition and setting   Data  Reviewed: CBC: Recent Labs  Lab 09/11/17 0429 09/12/17 0459 09/12/17 2150 09/13/17 0309 09/14/17 0500  WBC 3.3* 5.5 3.9* 6.5 7.2  NEUTROABS  --  0.9* 0.8 0.9  --   HGB 11.6* 10.6* 10.3* 10.4* 10.5*  HCT 36.3 33.1* 32.1* 32.8* 33.2*  MCV 94.0 95.7 95.3 95.6 95.1  PLT 23* 23* 114* 111* 68*   Basic Metabolic Panel: Recent Labs  Lab 09/09/17 0401 09/11/17 0429 09/12/17 0459 09/13/17 0309 09/14/17 0500  NA 140 137 138 141 141  K 4.2 4.4 3.9 3.6 3.5  CL 103 100* 104 106 104  CO2 '30 30 27 27 27  '$ GLUCOSE 119* 124* 148* 147* 171*  BUN 18 26* '14 14 16  '$ CREATININE 1.07* 1.20* 1.05* 1.07* 1.01*  CALCIUM 8.6* 8.7* 8.1* 8.1* 8.1*  MG  --  2.3  --  1.9  --   PHOS  --   --  2.0*  --   --    GFR: Estimated Creatinine Clearance: 41.2 mL/min (A) (by C-G formula based on SCr of 1.01 mg/dL (H)). Liver Function Tests: Recent Labs  Lab 09/09/17 0401 09/13/17 0309  AST 20 32  ALT 11* 20  ALKPHOS 56 52  BILITOT 1.1 0.7  PROT 7.5 6.6  ALBUMIN 3.2* 2.5*   No results for input(s): LIPASE, AMYLASE in the last 168 hours. No results for input(s): AMMONIA in the last 168 hours. Coagulation Profile: Recent Labs  Lab 09/13/17 0309  INR 1.30   Cardiac Enzymes: No results for input(s): CKTOTAL, CKMB, CKMBINDEX, TROPONINI in the last 168 hours. BNP (last 3 results) No results for input(s): PROBNP in the last 8760 hours. HbA1C: No results for input(s): HGBA1C in the last 72 hours. CBG: No results for input(s): GLUCAP in the last 168 hours. Lipid Profile: No results for input(s): CHOL, HDL, LDLCALC, TRIG, CHOLHDL, LDLDIRECT in the last 72 hours. Thyroid Function Tests: No results for input(s): TSH, T4TOTAL, FREET4, T3FREE, THYROIDAB in the last 72 hours. Anemia Panel: No results for input(s): VITAMINB12, FOLATE, FERRITIN, TIBC, IRON, RETICCTPCT in the last 72 hours. Urine analysis:    Component Value Date/Time   COLORURINE YELLOW 09/06/2017 0024   APPEARANCEUR HAZY (A) 09/06/2017  0024   LABSPEC 1.008 09/06/2017 0024   PHURINE 7.0 09/06/2017 0024   GLUCOSEU NEGATIVE 09/06/2017 0024   HGBUR NEGATIVE 09/06/2017 0024   BILIRUBINUR NEGATIVE 09/06/2017 0024   KETONESUR NEGATIVE 09/06/2017 0024   PROTEINUR NEGATIVE 09/06/2017 0024   UROBILINOGEN 1.0 05/31/2010 2325   NITRITE NEGATIVE 09/06/2017 0024   LEUKOCYTESUR SMALL (A) 09/06/2017 0024   Sepsis Labs: '@LABRCNTIP'$ (procalcitonin:4,lacticidven:4)  ) Recent Results (from the past 240 hour(s))  Culture, blood (Routine X 2) w Reflex to ID Panel     Status: None   Collection Time: 09/28/2017  7:19 PM  Result Value Ref Range Status   Specimen Description   Final    BLOOD RIGHT HAND Performed at [facility], [place] [address] [place], [place] [address]    Special Requests   Final    BOTTLES DRAWN AEROBIC ONLY Blood Culture adequate volume Performed at [facility] [address] [place], [place] [address]    Culture   Final    NO GROWTH 5 DAYS Performed at [facility], [place] [address]  [place], [place] [address]    Report Status 09/11/2017 FINAL  Final  Culture, blood (Routine X 2) w Reflex to ID Panel     Status: None   Collection Time: 09/17/2017  7:19 PM  Result Value Ref Range Status   Specimen Description   Final    BLOOD LEFT HAND Performed at [facility] [address], [place], [place] [address]    Special Requests   Final    BOTTLES DRAWN AEROBIC ONLY Blood Culture adequate volume Performed at [facility] [address] [place], [place] [address]    Culture   Final    NO GROWTH 5 DAYS Performed at [facility], [place] [address] [place], [place] [address]    Report Status 09/11/2017 FINAL  Final  Culture, blood (routine x 2)     Status: None (Preliminary result)   Collection Time: 09/10/17  8:45 AM  Result Value Ref Range Status   Specimen Description BLOOD LEFT ARM  Final   Special Requests   Final     BOTTLES DRAWN AEROBIC ONLY Blood Culture adequate volume   Culture   Final    NO GROWTH 4 DAYS Performed at [facility], [place] [address], [place], [place] [address]    Report Status PENDING  Incomplete  Culture, blood (routine x 2)     Status: None (Preliminary result)   Collection Time: 09/10/17  8:58 AM  Result Value Ref Range Status   Specimen Description BLOOD LEFT ARM  Final   Special Requests   Final    BOTTLES DRAWN AEROBIC AND ANAEROBIC Blood Culture adequate volume   Culture   Final    NO GROWTH 4 DAYS Performed at [facility], [address] [place], [place] [address]    Report Status PENDING  Incomplete  Culture, blood (routine x 2)     Status: None (Preliminary result)   Collection Time: 09/12/17 12:50 PM  Result Value Ref Range Status   Specimen Description   Final    BLOOD RIGHT ANTECUBITAL Performed at [facility] [address], [place], [place] [address]    Special Requests   Final    BOTTLES DRAWN AEROBIC ONLY Blood Culture adequate volume Performed at [facility] [address], [place], [place] [address]    Culture   Final    NO GROWTH 2 DAYS Performed at [facility] [address] [place], [place] [address]    Report Status PENDING  Incomplete  Culture, blood (routine x 2)     Status: None (Preliminary result)   Collection Time: 09/12/17 12:50 PM  Result Value Ref Range Status   Specimen Description   Final    BLOOD RIGHT HAND Performed at [facility] [address], [place], [place] [address]    Special Requests   Final    BOTTLES DRAWN AEROBIC AND ANAEROBIC Blood Culture adequate volume Performed at [facility] [address], [place], [place] [address]    Culture   Final    NO GROWTH 2 DAYS Performed at [facility] [address], [place], [place] [address]    Report Status PENDING  Incomplete  MRSA PCR Screening     Status: None    Collection Time: 09/12/17  3:38 PM  Result Value Ref Range Status   MRSA by PCR NEGATIVE NEGATIVE Final    Comment:  The GeneXpert MRSA Assay (FDA approved for NASAL specimens only), is one component of a comprehensive MRSA colonization surveillance program. It is not intended to diagnose MRSA infection nor to guide or monitor treatment for MRSA infections. Performed at [facility], [place] [address], [place], [place] [address]       Studies: Mr Jones Broom Si Joints Wo Contrast  Result Date: 09/13/2017 CLINICAL DATA:  Osteomyelitis of the sacrum suspected. Possible history of lymphoma or leukemia. Pancytopenia. EXAM: MRI PELVIS WITHOUT CONTRAST TECHNIQUE: Multiplanar multisequence MR imaging of the pelvis was performed. No intravenous contrast was administered. COMPARISON:  04/26/2007 pelvis MRI FINDINGS: Urinary Tract: Urinary bladder is unremarkable. No evidence of distal hydroureter. Bowel:  Unremarkable visualized pelvic bowel loops. Vascular/Lymphatic: No pathologically enlarged lymph nodes. No significant vascular abnormality seen. Reproductive:  Hysterectomy.  No adnexal mass. Other:  No free fluid. Musculoskeletal: Patchy heterogeneous marrow pattern of the bony pelvis and sacrum. Slight marrow edema of the right iliac bone posteriorly may reflect stigmata of recent marrow biopsy. Redemonstration of degenerative joint space narrowing of both hips minimal spurring at the femoral head-neck junction bilaterally. There is lower lumbar degenerative disc and facet arthropathy of the included L4-5 and L5-S1 levels. Sacroiliac joints are maintained bilaterally. Pubic symphysis is intact. No marrow signal abnormality suspicious for acute fracture, frank bone destruction or osteomyelitis. Edema of the left obturator internus and pectineus muscles compatible with muscle strain. IMPRESSION: 1. New heterogeneous ill-defined marrow pattern of the included pelvis and sacrum that  may reflect and infiltrative marrow abnormality such as leukemia or lymphoma. 2. Mild marrow edema involving the right iliac bone would be in keeping with the patient's recent bone marrow biopsy site. 3. No conclusive evidence for osteomyelitis. 4. Left pectineus and obturator internus muscle strains. Electronically Signed   By: Ashley Royalty M.D.   On: 09/13/2017 19:02   Ir Picc Placement Right >5 Yrs Inc Img Guide  Result Date: 09/14/2017 INDICATION: 83 year old female referred for PICC for AML. EXAM: PICC LINE PLACEMENT WITH ULTRASOUND AND FLUOROSCOPIC GUIDANCE MEDICATIONS: None ANESTHESIA/SEDATION: None FLUOROSCOPY TIME:  Fluoroscopy Time: 0 minutes 12 seconds (0.9 mGy). COMPLICATIONS: None PROCEDURE: Informed written consent was obtained from the patient after a thorough discussion of the procedural risks, benefits and alternatives. All questions were addressed. Maximal Sterile Barrier Technique was utilized including caps, mask, sterile gowns, sterile gloves, sterile drape, hand hygiene and skin antiseptic. A timeout was performed prior to the initiation of the procedure. Patient was position in the supine position on the fluoroscopy table with the right arm abducted 90 degrees. Ultrasound survey of the upper extremity was performed with images stored and sent to PACs. The right basilic vein was selected for access. Once the patient was prepped and draped in the usual sterile fashion, the skin and subcutaneous tissues were generously infiltrated with 1% lidocaine for local anesthesia. A micropuncture access kit was then used to access the targeted vein. Wire was passed centrally, confirmed to be within the venous system under fluoroscopy. A small stab incision was made with an 11 blade scalpel and the sheath was then placed over the wire. Estimated length of the catheter was then performed with the indwelling wire. Catheter was amputated at 37 cm length and placed with coaxial wire through the peel-away.  Double-lumen, power injectable PICC in the basilic vein. Tip confirmed at the cavoatrial junction, and the catheter is ready for use. Stat lock was placed. Patient tolerated the procedure well and remained hemodynamically stable throughout. No complications  were encountered and no significant blood loss was encountered. IMPRESSION: Status post right upper extremity PICC.  Catheter ready for use. Signed, Dulcy Fanny. Dellia Nims, RPVI [facility] Electronically Signed   By: Corrie Mckusick D.O.   On: 09/14/2017 07:35    Scheduled Meds: . allopurinol  100 mg Oral Daily  . antiseptic oral rinse  15 mL Mouth Rinse Q4H  . Chlorhexidine Gluconate Cloth  6 each Topical Daily  . collagenase   Topical Daily  . feeding supplement (ENSURE ENLIVE)  237 mL Oral BID BM  . ketorolac  1 drop Both Eyes QID  . multivitamin with minerals  1 tablet Oral Daily  . nutrition supplement (JUVEN)  1 packet Oral BID BM  . polyethylene glycol  17 g Oral BID  . prednisoLONE acetate  1 drop Both Eyes QID  . sodium chloride flush  10-40 mL Intracatheter Q12H    Continuous Infusions: . sodium chloride Stopped (09/13/17 1024)  . ceFEPime (MAXIPIME) IV Stopped (09/14/17 7829)  . vancomycin Stopped (09/13/17 1527)     LOS: 8 days     Kayleen Memos, MD [facility] Pager [phone]  If 7PM-7AM, please contact night-coverage [email or web address] Password [email or web address] 09/14/2017, 2:15 PM

## 2017-09-14 NOTE — Progress Notes (Signed)
Physical Therapy Treatment Patient Details Name: Leah Lewis MRN: [id number] DOB: 13-Aug-1934 Today's Date: 09/14/2017    History of Present Illness 83 y.o. female with medical history significant of hypertension, prior stroke hx of left DVT.  Patient presented to the emergency department complaining of generalized weakness and fatigue. Dx of fever,anemia.  New diagnosis of AML-oncology following    PT Comments    BP 86/40 so mobility deferred. Performed BUE/LE exercises in bed with assistance. Will follow.    Follow Up Recommendations  Home health PT;Supervision/Assistance - 24 hour     Equipment Recommendations  None recommended by PT    Recommendations for Other Services       Precautions / Restrictions Precautions Precautions: Other (comment);Fall Precaution Comments: reports no h/o falls in past 1 year; pt is legally blind but can see objects Restrictions Weight Bearing Restrictions: No    Mobility  Bed Mobility                  Transfers                    Ambulation/Gait                 Stairs             Wheelchair Mobility    Modified Rankin (Stroke Patients Only)       Balance                                            Cognition Arousal/Alertness: Awake/alert Behavior During Therapy: WFL for tasks assessed/performed Overall Cognitive Status: Within Functional Limits for tasks assessed                                        Exercises General Exercises - Upper Extremity Shoulder Flexion: AROM;Strengthening;10 reps;Supine General Exercises - Lower Extremity Ankle Circles/Pumps: AROM;Both;10 reps;Supine Short Arc Quad: AAROM;Both;10 reps;Supine Heel Slides: AAROM;Both;10 reps;Supine Hip ABduction/ADduction: AAROM;Both;10 reps;Supine    General Comments        Pertinent Vitals/Pain Pain Assessment: No/denies pain    Home Living                      Prior  Function            PT Goals (current goals can now be found in the care plan section) Acute Rehab PT Goals Patient Stated Goal: return home, be able to do housework PT Goal Formulation: With patient Time For Goal Achievement: 09/20/17 Potential to Achieve Goals: Fair Progress towards PT goals: Not progressing toward goals - comment(hypotension)    Frequency    Min 3X/week      PT Plan Current plan remains appropriate    Co-evaluation              AM-PAC PT "6 Clicks" Daily Activity  Outcome Measure  Difficulty turning over in bed (including adjusting bedclothes, sheets and blankets)?: A Lot Difficulty moving from lying on back to sitting on the side of the bed? : A Lot Difficulty sitting down on and standing up from a chair with arms (e.g., wheelchair, bedside commode, etc,.)?: A Lot Help needed moving to and from a bed to chair (including a wheelchair)?: A Lot Help needed walking in hospital room?:  A Lot Help needed climbing 3-5 steps with a railing? : A Lot 6 Click Score: 12    End of Session   Activity Tolerance: Other (comment)(BP 86/40, mobility deferred) Patient left: with call bell/phone within reach;in bed Nurse Communication: Other (comment)(BP low) PT Visit Diagnosis: Difficulty in walking, not elsewhere classified (R26.2)     Time: 1500-1509 PT Time Calculation (min) (ACUTE ONLY): 9 min  Charges:  $Therapeutic Exercise: 8-22 mins                    G Codes:          Philomena Doheny 09/14/2017, 3:27 PM [phone]

## 2017-09-15 DIAGNOSIS — E7800 Pure hypercholesterolemia, unspecified: Secondary | ICD-10-CM

## 2017-09-15 DIAGNOSIS — I4891 Unspecified atrial fibrillation: Secondary | ICD-10-CM

## 2017-09-15 DIAGNOSIS — R5081 Fever presenting with conditions classified elsewhere: Secondary | ICD-10-CM

## 2017-09-15 HISTORY — DX: Unspecified atrial fibrillation: I48.91

## 2017-09-15 LAB — CULTURE, BLOOD (ROUTINE X 2)
Culture: NO GROWTH
Culture: NO GROWTH
Special Requests: ADEQUATE
Special Requests: ADEQUATE

## 2017-09-15 LAB — BASIC METABOLIC PANEL
Anion gap: 7 (ref 5–15)
BUN: 19 mg/dL (ref 6–20)
CALCIUM: 7.4 mg/dL — AB (ref 8.9–10.3)
CHLORIDE: 108 mmol/L (ref 101–111)
CO2: 27 mmol/L (ref 22–32)
CREATININE: 1.06 mg/dL — AB (ref 0.44–1.00)
GFR, EST AFRICAN AMERICAN: 56 mL/min — AB (ref >60)
GFR, EST NON AFRICAN AMERICAN: 48 mL/min — AB (ref >60)
Glucose, Bld: 157 mg/dL — ABNORMAL HIGH (ref 65–99)
Potassium: 3.3 mmol/L — ABNORMAL LOW (ref 3.5–5.1)
SODIUM: 142 mmol/L (ref 135–145)

## 2017-09-15 LAB — CBC
HCT: 30 % — ABNORMAL LOW (ref 36.0–46.0)
HEMOGLOBIN: 9.6 g/dL — AB (ref 12.0–15.0)
MCH: 30.1 pg (ref 26.0–34.0)
MCHC: 32 g/dL (ref 30.0–36.0)
MCV: 94 fL (ref 78.0–100.0)
PLATELETS: 41 10*3/uL — AB (ref 150–400)
RBC: 3.19 MIL/uL — ABNORMAL LOW (ref 3.87–5.11)
RDW: 19 % — ABNORMAL HIGH (ref 11.5–15.5)
WBC: 7.3 10*3/uL (ref 4.0–10.5)

## 2017-09-15 LAB — URIC ACID: URIC ACID, SERUM: 2 mg/dL — AB (ref 2.3–6.6)

## 2017-09-15 MED ORDER — ALTEPLASE 2 MG IJ SOLR: 2.0000 mg | Freq: Once | INTRAMUSCULAR | Status: DC | PRN

## 2017-09-15 MED ORDER — SODIUM CHLORIDE 0.9 % IV SOLN
20.0000 mg/m2 | Freq: Once | INTRAVENOUS | Status: AC
  Administered 2017-09-15: 35 mg via INTRAVENOUS
  Filled 2017-09-15: qty 7

## 2017-09-15 MED ORDER — POTASSIUM CHLORIDE CRYS ER 20 MEQ PO TBCR
40.0000 meq | EXTENDED_RELEASE_TABLET | Freq: Two times a day (BID) | ORAL | Status: DC
  Administered 2017-09-15: 40 meq via ORAL
  Filled 2017-09-15: qty 2

## 2017-09-15 MED ORDER — SODIUM CHLORIDE 0.9 % IV SOLN: Freq: Once | INTRAVENOUS | Status: DC

## 2017-09-15 MED ORDER — PROCHLORPERAZINE MALEATE 10 MG PO TABS
10.0000 mg | ORAL_TABLET | Freq: Once | ORAL | Status: AC
  Administered 2017-09-15: 10 mg via ORAL
  Filled 2017-09-15: qty 1

## 2017-09-15 MED ORDER — HEPARIN SOD (PORK) LOCK FLUSH 100 UNIT/ML IV SOLN
250.0000 [IU] | Freq: Once | INTRAVENOUS | Status: DC | PRN
  Filled 2017-09-15: qty 2.5

## 2017-09-15 MED ORDER — SODIUM CHLORIDE 0.9% FLUSH
10.0000 mL | INTRAVENOUS | Status: DC | PRN
  Administered 2017-09-16: 10 mL
  Filled 2017-09-15: qty 10

## 2017-09-15 MED ORDER — HEPARIN SOD (PORK) LOCK FLUSH 100 UNIT/ML IV SOLN
500.0000 [IU] | Freq: Once | INTRAVENOUS | Status: DC | PRN
  Filled 2017-09-15: qty 5

## 2017-09-15 MED ORDER — SODIUM CHLORIDE 0.9% FLUSH: 3.0000 mL | INTRAVENOUS | Status: DC | PRN

## 2017-09-15 NOTE — Progress Notes (Signed)
PT Cancellation Note  Patient Details Name: Leah Lewis MRN: [id number] DOB: [birth year]   Cancelled Treatment:     RN in room setting up first round of CHEMO.  Will check back later as schedule permits.  Pt has been evaluated with rec for [facility] PT/24 supervision    Rica Koyanagi  PTA [facility]  Acute  Rehab Pager      [phone]

## 2017-09-15 NOTE — Progress Notes (Addendum)
PROGRESS NOTE  Leah Lewis XEN:[id number] DOB: 19-May-1934 DOA: 09/12/2017 PCP: Wenda Low, MD  HPI/Recap of past 24 hours: [name] is a 83 year old female with past medical history significant for osteoporosis, hyperlipidemia, DVT on Xarelto who presented to the ED with complaints of generalized weakness.  Pancytopenic and found to have AML.  Oncology following.  09/13/2017: Patient seen and examined at bedside she has no new complaints.  Febrile this morning with T-max of 102.9.  Blood cultures drawn yesterday still in process.  On broad-spectrum IV antibiotics and will continue.  MRI of sacrum ordered to rule out osteomyelitis.  09/14/2017: Patient seen and examined with her daughter at bedside.  No new complaints.  Will start chemotherapy treatment tomorrow 09/15/2017.  09/15/17: seen and examined. Appears weak but has no specific complaints. Ordered 12 leads EKG due to IRR on exam. Revealed a-fib. No reported prior hx of afib. Platelet dropped to 41k this am. Denies chest pain, palpitations or dyspnea.  Assessment/Plan: Active Problems:   Pressure injury of skin   AML (acute myeloid leukemia) (HCC)   Goals of care, counseling/discussion  Generalized weakness most likely secondary to AML versus others Oncology following PT evaluated and recommended home health PT and 24 hours assistance. Fall precautions Encourage increasing p.o. calorie intake Continue Ensure and oral supplements  Newly diagnosed afib with rvr No reported prior hx of afib Previous hx of cva and dvt Not antilcoagulated due to thrombocytopenia  Hypokalemia K+ 3.4 repleted w oral kcl Repeat bmp am  Worsening thrombocytopenia plt down to 41k Repeat CBC in the am  Newly diagnosed AML Plan to start chemo today Monitor closely  Persistent fever most likely related to her leukemia No clear source of active infection T-max of 101.2 this afternoon Continue broad-spectrum IV antibiotics IV cefepime and  IV vancomycin MRI of sacrum no clear sign of osteomyelitis.  Unstageable sacral decubitus ulcer, appears stable Continue IV antibiotics Wound care specialist consulted.  Highly appreciated recommendations. Management as stated above  Pancytopenia most likely secondary to AML Bone marrow biopsy on 09/07/2017 showed 10% blasts Oncology following PICC line placed by interventional radiology Did not get a Port-A-Cath placement due to persistent fevers  Hypertension Blood pressures well stable  Hyperlipidemia Continue statin  History of DVT Previously on Xarelto Aspirin held due to thrombocytopenia Defer to heme oncology to restart oral anticoagulation  Hx of CVA Noted  Osteoporosis Continue to hold alendronate Continue to hold vitamin D and calcium  Code Status: Full code  Family Communication: Daughter at bedside  Disposition Plan: Home when clinically stable with home health PT   Consultants:  Oncology  Interventional radiology  Procedures:  Port-A-Cath placement plan today by interventional radiology   Antimicrobials: IV cefepime IV vancomycin DVT prophylaxis: SCDs   Objective: Vitals:   09/15/17 1500 09/15/17 1600 09/15/17 1831 09/15/17 2000  BP:  (!) 123/44    Pulse: 96 98    Resp: (!) 24 (!) 28    Temp: (!) 101.2 F (38.4 C)  99.8 F (37.7 C) 99.5 F (37.5 C)  TempSrc:   Oral Oral  SpO2: 97% 95%    Weight:      Height:        Intake/Output Summary (Last 24 hours) at 09/15/2017 2038 Last data filed at 09/15/2017 1900 Gross per 24 hour  Intake -  Output 2350 ml  Net -2350 ml   Filed Weights   09/26/2017 1416 09/13/17 1100  Weight: 68.9 kg (152 lb) 67.5 kg (148  lb 13 oz)    Exam:  . General: 83 y.o. year-old female WD WN NAD A&O x3.   . Cardiovascular: RRR no rubs or gallops. No thyromegaly or JVD.   Marland Kitchen Respiratory: Clear to auscultation with no wheezes or rales.  Poor inspiratory effort.  Abdomen: Soft nontender nondistended with normal  bowel sounds x4 quadrants. . Musculoskeletal: No lower extremity edema. 2/4 pulses in all 4 extremities. . Skin: Unstageable sacral decubitus ulcer . Psychiatry: Mood is appropriate for condition and setting   Data Reviewed: CBC: Recent Labs  Lab 09/12/17 0459 09/12/17 2150 09/13/17 0309 09/14/17 0500 09/15/17 0446  WBC 5.5 3.9* 6.5 7.2 7.3  NEUTROABS 0.9* 0.8 0.9  --   --   HGB 10.6* 10.3* 10.4* 10.5* 9.6*  HCT 33.1* 32.1* 32.8* 33.2* 30.0*  MCV 95.7 95.3 95.6 95.1 94.0  PLT 23* 114* 111* 68* 41*   Basic Metabolic Panel: Recent Labs  Lab 09/11/17 0429 09/12/17 0459 09/13/17 0309 09/14/17 0500 09/15/17 0446  NA 137 138 141 141 142  K 4.4 3.9 3.6 3.5 3.3*  CL 100* 104 106 104 108  CO2 _0 GLUCOSE 124* 148* 147* 171* 157*  BUN 26* _1 CREATININE 1.20* 1.05* 1.07* 1.01* 1.06*  CALCIUM 8.7* 8.1* 8.1* 8.1* 7.4*  MG 2.3  --  1.9  --   --   PHOS  --  2.0*  --   --   --    GFR: Estimated Creatinine Clearance: 39.3 mL/min (A) (by C-G formula based on SCr of 1.06 mg/dL (H)). Liver Function Tests: Recent Labs  Lab 09/09/17 0401 09/13/17 0309  AST 20 32  ALT 11* 20  ALKPHOS 56 52  BILITOT 1.1 0.7  PROT 7.5 6.6  ALBUMIN 3.2* 2.5*   No results for input(s): LIPASE, AMYLASE in the last 168 hours. No results for input(s): AMMONIA in the last 168 hours. Coagulation Profile: Recent Labs  Lab 09/13/17 0309  INR 1.30   Cardiac Enzymes: No results for input(s): CKTOTAL, CKMB, CKMBINDEX, TROPONINI in the last 168 hours. BNP (last 3 results) No results for input(s): PROBNP in the last 8760 hours. HbA1C: No results for input(s): HGBA1C in the last 72 hours. CBG: No results for input(s): GLUCAP in the last 168 hours. Lipid Profile: No results for input(s): CHOL, HDL, LDLCALC, TRIG, CHOLHDL, LDLDIRECT in the last 72 hours. Thyroid Function Tests: No results for input(s): TSH, T4TOTAL, FREET4, T3FREE, THYROIDAB in the last 72 hours. Anemia  Panel: No results for input(s): VITAMINB12, FOLATE, FERRITIN, TIBC, IRON, RETICCTPCT in the last 72 hours. Urine analysis:    Component Value Date/Time   COLORURINE YELLOW 09/06/2017 0024   APPEARANCEUR HAZY (A) 09/06/2017 0024   LABSPEC 1.008 09/06/2017 0024   PHURINE 7.0 09/06/2017 0024   GLUCOSEU NEGATIVE 09/06/2017 0024   HGBUR NEGATIVE 09/06/2017 0024   BILIRUBINUR NEGATIVE 09/06/2017 0024   KETONESUR NEGATIVE 09/06/2017 0024   PROTEINUR NEGATIVE 09/06/2017 0024   UROBILINOGEN 1.0 05/31/2010 2325   NITRITE NEGATIVE 09/06/2017 0024   LEUKOCYTESUR SMALL (A) 09/06/2017 0024   Sepsis Labs: _2 (procalcitonin:4,lacticidven:4)  ) Recent Results (from the past 240 hour(s))  Culture, blood (routine x 2)     Status: None   Collection Time: 09/10/17  8:45 AM  Result Value Ref Range Status   Specimen Description BLOOD LEFT ARM  Final   Special Requests   Final    BOTTLES DRAWN AEROBIC ONLY Blood Culture adequate volume   Culture  Final    NO GROWTH 5 DAYS Performed at [facility], [place] [address], [place], [place] [address]    Report Status 09/15/2017 FINAL  Final  Culture, blood (routine x 2)     Status: None   Collection Time: 09/10/17  8:58 AM  Result Value Ref Range Status   Specimen Description BLOOD LEFT ARM  Final   Special Requests   Final    BOTTLES DRAWN AEROBIC AND ANAEROBIC Blood Culture adequate volume   Culture   Final    NO GROWTH 5 DAYS Performed at [facility], [place] [address], [place], [place] [address]    Report Status 09/15/2017 FINAL  Final  Culture, blood (routine x 2)     Status: None (Preliminary result)   Collection Time: 09/12/17 12:50 PM  Result Value Ref Range Status   Specimen Description   Final    BLOOD RIGHT ANTECUBITAL Performed at [facility] [address], [place], [place] [address]    Special Requests   Final    BOTTLES DRAWN AEROBIC ONLY Blood Culture adequate volume Performed at [facility] [address] [place], [place] [address]    Culture   Final    NO GROWTH 3 DAYS Performed at [facility], [place] [address], [place], [place] [address]    Report Status PENDING  Incomplete  Culture, blood (routine x 2)     Status: None (Preliminary result)   Collection Time: 09/12/17 12:50 PM  Result Value Ref Range Status   Specimen Description   Final    BLOOD RIGHT HAND Performed at [facility] [address] [place], [place] [address]    Special Requests   Final    BOTTLES DRAWN AEROBIC AND ANAEROBIC Blood Culture adequate volume Performed at [facility] [address], [place], [place] [address]    Culture   Final    NO GROWTH 3 DAYS Performed at [facility], [place] [address] [place], [place] [address]    Report Status PENDING  Incomplete  MRSA PCR Screening     Status: None   Collection Time: 09/12/17  3:38 PM  Result Value Ref Range Status   MRSA by PCR NEGATIVE NEGATIVE Final    Comment:        The GeneXpert MRSA Assay (FDA approved for NASAL specimens only), is one component of a comprehensive MRSA colonization surveillance program. It is not intended to diagnose MRSA infection nor to guide or monitor treatment for MRSA infections. Performed at [facility], [place] [address], [place], [place] [address]       Studies: No results found.  Scheduled Meds: . antiseptic oral rinse  15 mL Mouth Rinse Q4H  . Chlorhexidine Gluconate Cloth  6 each Topical Daily  . collagenase   Topical Daily  . feeding supplement (ENSURE ENLIVE)  237 mL Oral BID BM  . ketorolac  1 drop Both Eyes QID  . multivitamin with minerals  1 tablet Oral Daily  . nutrition supplement (JUVEN)  1 packet Oral BID BM  . polyethylene glycol  17 g Oral BID  . prednisoLONE acetate  1 drop Both Eyes QID  . sodium chloride flush  10-40 mL Intracatheter Q12H    Continuous Infusions: . sodium chloride      . sodium chloride Stopped (09/13/17 1024)  . sodium chloride 100 mL/hr at 09/14/17 1641  . ceFEPime (MAXIPIME) IV Stopped (09/15/17  1741)  . vancomycin Stopped (09/15/17 1549)     LOS: 9 days     Kayleen Memos, MD [facility] Pager [phone]  If 7PM-7AM, please contact night-coverage [email or web address] Password [facility] 09/15/2017, 8:38 PM

## 2017-09-15 NOTE — Progress Notes (Signed)
Dosage and dilution for decitabine verified with Aldean Baker, RN.

## 2017-09-15 NOTE — Progress Notes (Signed)
For right now, Ms. Martinique seems to be feeling a little bit better.  We are not able to start treatment on her yesterday because of staffing issues.  We will be able to start today.  She still having some temperatures.  Again, I have to think that this is coming from her underlying leukemia.  Her labs today show her white count to be 7.3.  Hemoglobin 9.6.  Platelet count 41,000.  Her uric acid is only 2.0.  Her creatinine is 1.06.  Potassium 3.3.  As far as her cultures, they are all negative.  She is not feeling as bad today.  He has had no nausea or vomiting.  She has had no cough.  There is no bleeding.  She has had no diarrhea.  On her physical exam, her temperature is 99.9.  Pulse 93.  Blood pressure 103/48.  Oral exam shows no mucositis.  Lungs are clear bilaterally.  Cardiac exam regular rate and rhythm with no murmurs.  Abdomen is soft.  She has good bowel sounds.  There is no palpable liver or spleen tip.  Extremities shows no clubbing, cyanosis or edema.  Skin exam shows no rashes.  She has coverings on the decubiti on her back.  Ms. Martinique has acute myeloid leukemia.  We will start her Dacogen today.  He will be interesting to see what happens with her temperature spikes wants to start treatment.  The cytogenetics are going to be critical.  Hopefully they will come back next week.  I know that she is getting fantastic care from everybody in the ICU.  [name], MD  Romans 5:3-5

## 2017-09-15 NOTE — Progress Notes (Signed)
Patient tolerated chemotherapy without issue.  Daughter at bedside.  Chemo teaching initiated.  Consent obtained verbally from patient, signature per daughter.

## 2017-09-16 ENCOUNTER — Other Ambulatory Visit: Payer: Self-pay | Admitting: *Deleted

## 2017-09-16 ENCOUNTER — Encounter (HOSPITAL_COMMUNITY): Payer: Self-pay | Admitting: Hematology & Oncology

## 2017-09-16 ENCOUNTER — Encounter: Payer: Self-pay | Admitting: Pharmacist

## 2017-09-16 ENCOUNTER — Other Ambulatory Visit: Payer: Self-pay | Admitting: Hematology & Oncology

## 2017-09-16 DIAGNOSIS — Z7189 Other specified counseling: Secondary | ICD-10-CM

## 2017-09-16 DIAGNOSIS — C9200 Acute myeloblastic leukemia, not having achieved remission: Secondary | ICD-10-CM | POA: Insufficient documentation

## 2017-09-16 DIAGNOSIS — Z515 Encounter for palliative care: Secondary | ICD-10-CM

## 2017-09-16 LAB — CBC WITH DIFFERENTIAL/PLATELET
BASOS PCT: 0 %
Basophils Absolute: 0 10*3/uL (ref 0.0–0.1)
Blasts: 6 %
EOS PCT: 0 %
Eosinophils Absolute: 0 10*3/uL (ref 0.0–0.7)
HEMATOCRIT: 29.1 % — AB (ref 36.0–46.0)
Hemoglobin: 9.5 g/dL — ABNORMAL LOW (ref 12.0–15.0)
LYMPHS ABS: 1.9 10*3/uL (ref 0.7–4.0)
Lymphocytes Relative: 20 %
MCH: 30.7 pg (ref 26.0–34.0)
MCHC: 32.6 g/dL (ref 30.0–36.0)
MCV: 94.2 fL (ref 78.0–100.0)
MONO ABS: 4.8 10*3/uL — AB (ref 0.1–1.0)
Monocytes Relative: 50 %
NEUTROS ABS: 2.3 10*3/uL (ref 1.7–7.7)
Neutrophils Relative %: 24 %
Platelets: 68 10*3/uL — ABNORMAL LOW (ref 150–400)
RBC: 3.09 MIL/uL — AB (ref 3.87–5.11)
RDW: 19.2 % — AB (ref 11.5–15.5)
WBC: 9.5 10*3/uL (ref 4.0–10.5)

## 2017-09-16 LAB — CBC
HCT: 29.6 % — ABNORMAL LOW (ref 36.0–46.0)
Hemoglobin: 9.5 g/dL — ABNORMAL LOW (ref 12.0–15.0)
MCH: 30.2 pg (ref 26.0–34.0)
MCHC: 32.1 g/dL (ref 30.0–36.0)
MCV: 94 fL (ref 78.0–100.0)
PLATELETS: 18 10*3/uL — AB (ref 150–400)
RBC: 3.15 MIL/uL — ABNORMAL LOW (ref 3.87–5.11)
RDW: 19.2 % — ABNORMAL HIGH (ref 11.5–15.5)
WBC: 9 10*3/uL (ref 4.0–10.5)

## 2017-09-16 LAB — COMPREHENSIVE METABOLIC PANEL
ALBUMIN: 2 g/dL — AB (ref 3.5–5.0)
ALK PHOS: 48 U/L (ref 38–126)
ALT: 26 U/L (ref 14–54)
ANION GAP: 8 (ref 5–15)
AST: 40 U/L (ref 15–41)
BUN: 16 mg/dL (ref 6–20)
CALCIUM: 7.6 mg/dL — AB (ref 8.9–10.3)
CHLORIDE: 106 mmol/L (ref 101–111)
CO2: 26 mmol/L (ref 22–32)
Creatinine, Ser: 0.97 mg/dL (ref 0.44–1.00)
GFR calc non Af Amer: 53 mL/min — ABNORMAL LOW (ref >60)
GLUCOSE: 143 mg/dL — AB (ref 65–99)
POTASSIUM: 2.9 mmol/L — AB (ref 3.5–5.1)
SODIUM: 140 mmol/L (ref 135–145)
Total Bilirubin: 0.9 mg/dL (ref 0.3–1.2)
Total Protein: 6 g/dL — ABNORMAL LOW (ref 6.5–8.1)

## 2017-09-16 LAB — TYPE AND SCREEN
ABO/RH(D): A POS
Antibody Screen: POSITIVE
DAT, IGG: NEGATIVE

## 2017-09-16 LAB — URIC ACID: Uric Acid, Serum: 1.8 mg/dL — ABNORMAL LOW (ref 2.3–6.6)

## 2017-09-16 LAB — OCCULT BLOOD X 1 CARD TO LAB, STOOL: FECAL OCCULT BLD: NEGATIVE

## 2017-09-16 MED ORDER — SODIUM CHLORIDE 0.9 % IV SOLN: Freq: Once | INTRAVENOUS | Status: AC

## 2017-09-16 MED ORDER — ALTEPLASE 2 MG IJ SOLR: 2.0000 mg | Freq: Once | INTRAMUSCULAR | Status: DC | PRN

## 2017-09-16 MED ORDER — POTASSIUM CHLORIDE 10 MEQ/100ML IV SOLN
10.0000 meq | INTRAVENOUS | Status: AC
  Administered 2017-09-16 (×3): 10 meq via INTRAVENOUS
  Filled 2017-09-16 (×3): qty 100

## 2017-09-16 MED ORDER — HEPARIN SOD (PORK) LOCK FLUSH 100 UNIT/ML IV SOLN
500.0000 [IU] | Freq: Once | INTRAVENOUS | Status: DC | PRN
  Filled 2017-09-16: qty 5

## 2017-09-16 MED ORDER — DILTIAZEM HCL-DEXTROSE 100-5 MG/100ML-% IV SOLN (PREMIX)
5.0000 mg/h | INTRAVENOUS | Status: DC
  Administered 2017-09-16: 5 mg/h via INTRAVENOUS
  Administered 2017-09-17: 15 mg/h via INTRAVENOUS
  Filled 2017-09-16 (×2): qty 100

## 2017-09-16 MED ORDER — SODIUM CHLORIDE 0.9 % IV SOLN
20.0000 mg/m2 | Freq: Once | INTRAVENOUS | Status: AC
  Administered 2017-09-16: 35 mg via INTRAVENOUS
  Filled 2017-09-16: qty 7

## 2017-09-16 MED ORDER — SODIUM CHLORIDE 0.9 % IV SOLN: Freq: Once | INTRAVENOUS | Status: DC

## 2017-09-16 MED ORDER — SODIUM CHLORIDE 0.9% FLUSH: 10.0000 mL | INTRAVENOUS | Status: DC | PRN

## 2017-09-16 MED ORDER — VENETOCLAX 50 MG PO TABS: ORAL_TABLET | ORAL | 2 refills | Status: DC

## 2017-09-16 MED ORDER — SODIUM CHLORIDE 0.9% FLUSH: 3.0000 mL | INTRAVENOUS | Status: DC | PRN

## 2017-09-16 MED ORDER — MAGNESIUM SULFATE 2 GM/50ML IV SOLN
2.0000 g | Freq: Once | INTRAVENOUS | Status: AC
  Administered 2017-09-16: 2 g via INTRAVENOUS
  Filled 2017-09-16: qty 50

## 2017-09-16 MED ORDER — HEPARIN SOD (PORK) LOCK FLUSH 100 UNIT/ML IV SOLN
250.0000 [IU] | Freq: Once | INTRAVENOUS | Status: DC | PRN
  Filled 2017-09-16: qty 2.5

## 2017-09-16 MED ORDER — POTASSIUM CHLORIDE CRYS ER 20 MEQ PO TBCR
40.0000 meq | EXTENDED_RELEASE_TABLET | Freq: Two times a day (BID) | ORAL | Status: AC
  Administered 2017-09-16 (×2): 40 meq via ORAL
  Filled 2017-09-16 (×2): qty 2

## 2017-09-16 MED ORDER — PROCHLORPERAZINE MALEATE 10 MG PO TABS
10.0000 mg | ORAL_TABLET | Freq: Once | ORAL | Status: AC
  Administered 2017-09-16: 10 mg via ORAL
  Filled 2017-09-16: qty 1

## 2017-09-16 MED ORDER — SODIUM CHLORIDE 0.9 % IV SOLN
1.0000 g | Freq: Two times a day (BID) | INTRAVENOUS | Status: DC
  Administered 2017-09-16 – 2017-09-20 (×9): 1 g via INTRAVENOUS
  Filled 2017-09-16 (×10): qty 1

## 2017-09-16 MED ORDER — LABETALOL HCL 5 MG/ML IV SOLN
5.0000 mg | INTRAVENOUS | Status: DC | PRN
  Filled 2017-09-16: qty 4

## 2017-09-16 MED ORDER — ORAL CARE MOUTH RINSE
15.0000 mL | Freq: Two times a day (BID) | OROMUCOSAL | Status: DC
  Administered 2017-09-16 – 2017-10-11 (×27): 15 mL via OROMUCOSAL

## 2017-09-16 NOTE — Progress Notes (Signed)
Palliative consult received- left message for patient's daughter- Charolotte Eke requesting return call to arrange [place] meeting.   [name], AGNP-C Palliative Medicine  Please call Palliative Medicine team phone with any questions [phone]. For individual providers please see AMION.

## 2017-09-16 NOTE — Progress Notes (Signed)
Decitabine dosages and calculations verified with Drue Dun, RN.

## 2017-09-16 NOTE — Consult Note (Signed)
Consultation Note Date: 09/16/2017   Patient Name: Leah Lewis  DOB: 08/01/1934  MRN: [id number]  Age / Sex: 83 y.o., female  PCP: Wenda Low, MD Referring Physician: Kayleen Memos, DO  Reason for Consultation: Establishing goals of care  HPI/Patient Profile: 83 y.o. female  with past medical history of osteoporosis, hyperlipidemia, occlusive lower left extremity DVT (on Xarelto), CVA, admitted on 10/02/2017 with worsening weakness and fatigue. She was scheduled to be seen by oncology same day of admission but missed the appointment due to weakness. Workup this admission has revealed AML and she has been started on treatment with Dacogen. Palliative medicine consulted for [place]ia.    Clinical Assessment and Goals of Care: I reviewed patient's chart and evaluated patient. She was oriented to person, and able to state she was at "[facility]". She told me she was here because she was "sick", but was unable to give me any further details. She was not oriented to time. Her responses were one worded and very delayed. She moaned and called out, "oh my" during the evaluation.  Her grandson- [name] was at bedside and noted she had been having increasing confusion over the last several months. When I asked her if she wanted to know more about why she was in the hospital, she closed her eyes and did not engage. She denied pain or other complaints. She was unable to participate in goals of care discussion.  I called her listed contact- Charolotte Eke- twice in attempt to arrange [place] meeting. [name] tells me that Albertina Parr is the main point of contact. [name] notes that while his mother- Joycelyn Schmid is also listed- she cannot handle decision making emotionally- and that Albertina Parr should be present for all decision making discussions.  We discussed patient's nutritional and functional status prior to admission. She has been  declining significantly. She was using a walker some to get around the house. She requires significant assistance for ADL's. She was living a mostly bed to chair existence. She has lost a great deal of weight. [name] has also noted changes in her cognitive function with increasing confusion over the last 3-6 months. [name] states that Lamoni used to be "the sharpest one in the family", but he has noticed her forgetting things and becoming confused at times.   [name] states family is concerned about bringing [place] home in her current state. He feels she is going to need 24 hour care.  He noted that his family is interested in meeting with Palliative medicine team for further discussion of [place] and advanced healthcare planning.  Primary Decision Maker NEXT OF KIN - daughter- Charolotte Eke    SUMMARY OF RECOMMENDATIONS -Continue current care for now -I left another message for Albertina Parr requesting return call to schedule GOC meeting     Code Status/Advance Care Planning:  Full code  Palliative Prophylaxis:   Delirium Protocol  Prognosis:    Unable to determine  Discharge Planning: To Be Determined  Primary Diagnoses: Present on Admission: **None**   I have reviewed  the medical record, interviewed the patient and family, and examined the patient. The following aspects are pertinent.  Past Medical History:  Diagnosis Date  . AML (acute myeloid leukemia) ([facility]) 09/12/2017  . Goals of care, counseling/discussion 09/12/2017  . High cholesterol   . Hypertension   . Stroke [facility])    Social History   Socioeconomic History  . Marital status: Single    Spouse name: Not on file  . Number of children: Not on file  . Years of education: Not on file  . Highest education level: Not on file  Occupational History  . Not on file  Social Needs  . Financial resource strain: Not on file  . Food insecurity:    Worry: Not on file    Inability: Not on file  . Transportation needs:      Medical: Not on file    Non-medical: Not on file  Tobacco Use  . Smoking status: Never Smoker  . Smokeless tobacco: Former Systems developer    Types: Chew  Substance and Sexual Activity  . Alcohol use: No  . Drug use: No  . Sexual activity: Not on file  Lifestyle  . Physical activity:    Days per week: Not on file    Minutes per session: Not on file  . Stress: Not on file  Relationships  . Social connections:    Talks on phone: Not on file    Gets together: Not on file    Attends religious service: Not on file    Active member of club or organization: Not on file    Attends meetings of clubs or organizations: Not on file    Relationship status: Not on file  Other Topics Concern  . Not on file  Social History Narrative  . Not on file   Family History  Problem Relation Age of Onset  . Heart attack Mother   . Pneumonia Father   . Parkinsonism Father   . Breast cancer Sister    Scheduled Meds: . antiseptic oral rinse  15 mL Mouth Rinse Q4H  . Chlorhexidine Gluconate Cloth  6 each Topical Daily  . collagenase   Topical Daily  . feeding supplement (ENSURE ENLIVE)  237 mL Oral BID BM  . ketorolac  1 drop Both Eyes QID  . mouth rinse  15 mL Mouth Rinse BID  . multivitamin with minerals  1 tablet Oral Daily  . nutrition supplement (JUVEN)  1 packet Oral BID BM  . polyethylene glycol  17 g Oral BID  . potassium chloride  40 mEq Oral BID  . prednisoLONE acetate  1 drop Both Eyes QID  . sodium chloride flush  10-40 mL Intracatheter Q12H   Continuous Infusions: . sodium chloride    . sodium chloride    . sodium chloride 75 mL/hr at 09/16/17 0811  . meropenem (MERREM) IV Stopped (09/16/17 1420)   PRN Meds:.acetaminophen **OR** acetaminophen, alteplase, heparin lock flush, heparin lock flush, HYDROcodone-acetaminophen, ondansetron (ZOFRAN) IV, sodium chloride flush, sodium chloride flush, sodium chloride flush, sodium chloride flush, sodium chloride flush Medications Prior to  Admission:  Prior to Admission medications   Medication Sig Start Date End Date Taking? Authorizing Provider  alendronate (FOSAMAX) 70 MG tablet Take 70 mg by mouth once a week. 05/20/12  Yes [provider]  aspirin EC 81 MG tablet Take 81 mg by mouth 3 (three) times a week.   Yes [provider]  atorvastatin (LIPITOR) 40 MG tablet Take 40 mg by mouth  daily.     Yes [provider]  ketorolac (ACULAR) 0.5 % ophthalmic solution Place 1 drop into both eyes 4 (four) times daily.     Yes [provider]  pantoprazole (PROTONIX) 40 MG tablet Take 1 tablet (40 mg total) by mouth daily. Patient taking differently: Take 40 mg by mouth daily as needed (heartburn).  03/04/15  Yes Robbie Lis, MD  prednisoLONE acetate (PRED FORTE) 1 % ophthalmic suspension Place 1 drop into both eyes 4 (four) times daily.     Yes [provider]  Vitamin D, Ergocalciferol, (DRISDOL) 50000 UNITS CAPS capsule Take 50,000 Units by mouth every 30 (thirty) days.   Yes [provider]  Rivaroxaban (XARELTO STARTER PACK) 15 & 20 MG TBPK Take as directed on package: Start with one 15mg  tablet by mouth twice a day with food. On Day 22, switch to one 20mg  tablet once a day with food. Patient not taking: Reported on 10/02/2017 03/04/15   Robbie Lis, MD  Venetoclax 50 MG TABS Take 50 mg by mouth daily for 3 days, THEN 100 mg daily for 3 days, THEN 200 mg daily for 3 days, THEN 400 mg daily. 09/16/17 10/25/17  Volanda Napoleon, MD   Allergies  Allergen Reactions  . Percocet [Oxycodone-Acetaminophen] Other (See Comments)    "patient can take" but prefers not to   Review of Systems  Unable to perform ROS: Dementia    Physical Exam  Constitutional: She appears well-developed.  Frail, ill appearing  Neurological:  Lethargic, oriented to person and place  Skin: There is pallor.  Nursing note and vitals reviewed.   Vital Signs: BP (!) 141/74   Pulse 100   Temp (!) 101.7  F (38.7 C) (Oral) Comment: RN notified. Fans and ice packs provided.   Resp (!) 23   Ht 5\' 4"  (1.626 m)   Wt 67.5 kg (148 lb 13 oz)   SpO2 95%   BMI 25.54 kg/m  Pain Scale: 0-10   Pain Score: 4    SpO2: SpO2: 95 % O2 Device:SpO2: 95 % O2 Flow Rate: .O2 Flow Rate (L/min): 0 L/min  IO: Intake/output summary:   Intake/Output Summary (Last 24 hours) at 09/16/2017 1629 Last data filed at 09/16/2017 1448 Gross per 24 hour  Intake 4662.91 ml  Output 3750 ml  Net 912.91 ml    LBM: Last BM Date: 09/15/17 Baseline Weight: Weight: 68.9 kg (152 lb) Most recent weight: Weight: 67.5 kg (148 lb 13 oz)     Palliative Assessment/Data: PPS: 20%     Thank you for this consult. Palliative medicine will continue to follow and assist as needed.   Time In:1545 Time Out: 1700 Time Total: 75 mins Greater than 50%  of this time was spent counseling and coordinating care related to the above assessment and plan.  Signed by: [name], AGNP-C Palliative Medicine    Please contact Palliative Medicine Team phone at [phone] for questions and concerns.  For individual provider: See Shea Evans

## 2017-09-16 NOTE — Progress Notes (Signed)
CRITICAL VALUE ALERT  Critical Value:  Platelet 18  Date & Time Notied:  0639  Provider Notified: B[place]heimer  Orders Received/Actions taken:

## 2017-09-16 NOTE — Progress Notes (Unsigned)
I have spoken with Dr. Marin Olp, and have received the instructions below. Okay to receive chemotherapy today with pltc = 18. Lab orders have been entered for LDH and uric acid. Continue oral potassium ordered by hospitalist for K=2.9, patient is currently experiencing afib, potassium will continue to be monitored closely.

## 2017-09-16 NOTE — Progress Notes (Addendum)
We finally able to get started with treatment for Leah Lewis.  I very much appreciate our oncology chemotherapy nurse for doing this.  I know that it can be very difficult sometimes to have the manpower to do treatment off the oncology floor.  It looks like Leah Lewis went into atrial fibrillation.  I am not sure what could have led to this.  Her potassium today is only 2.9.  I probably would not anticoagulate her because of her thrombocytopenia.  I know this might be an issue.  Her potassium is quite low.  Her potassium is only 2.9.  This clearly needs to be improved and might be a factor with her having the atrial fibrillation.  Of note, we will see about adding venetoclax.  I think this would be a good idea.  I really do not think that tumor lysis is going to be an issue with this.  I very much appreciate pharmacy's help that they are providing.  I would get cardiology to help [place] out in this situation.  I think we really need to get good rate control and possibly try to get her back into sinus rhythm.  She tolerated the first dose well.  She is had no nausea or vomiting.  Her blood work today shows a white cell count to be 9000.  Hemoglobin 9.5.  Platelet count 18,000.  Her uric acid is only 1.8.  Her blood sugar is 143.  Her creatinine is 0.97.  She still has a low-grade temperature.  Yesterday she had a T-max of 102.6.  Her cultures still have been negative.  She still is on IV antibiotics.  Her appetite still is not that great.  We may need to have her on something to perk up her appetite a little bit.  I very much appreciate all the wonderful care that she is getting from everybody down in the ICU.  We still need to make sure that these decubiti in her back are well treated.  I think that the atrial fibrillation is clearly an issue that will affect her quality of life.  If we can somehow control this and may be get her back into sinus rhythm, this I think would really go a long way  to helping her out.  As always, we had a very good prayer session.  [name], MD  Deuteronomy 31:6

## 2017-09-16 NOTE — Progress Notes (Addendum)
PROGRESS NOTE  Leah Lewis WEX:[id number] DOB: December 05, 1934 DOA: 09/13/2017 PCP: Wenda Low, MD  HPI/Recap of past 24 hours: [name] is a 83 year old female with past medical history significant for osteoporosis, hyperlipidemia, DVT on Xarelto who presented to the ED with complaints of generalized weakness.  Pancytopenic and found to have AML.  Oncology following.  09/13/2017: Patient seen and examined at bedside she has no new complaints.  Febrile this morning with T-max of 102.9.  Blood cultures drawn yesterday still in process.  On broad-spectrum IV antibiotics and will continue.  MRI of sacrum ordered to rule out osteomyelitis.  09/14/2017: Patient seen and examined with her daughter at bedside.  No new complaints.  Will start chemotherapy treatment tomorrow 09/15/2017.  09/15/17: seen and examined. Appears weak but has no specific complaints. Ordered 12 leads EKG due to IRR on exam. Revealed a-fib. No reported prior hx of afib. Platelet dropped to 41k this am. Denies chest pain, palpitations or dyspnea.  09/16/17: Worsening thrombocytopenia.  Transfused 1 unit platelet.  Significant hypokalemia, repleted with IV and p.o. potassium with IV magnesium.  Cardiology consulted for A. fib RVR.  Patient seen and examined at her bedside with her daughter present.  Reports aching in her upper extremities bilaterally with no significant swelling or tenderness on palpation.  Discussed goals of care with her daughter.  Patient is still full code.  Palliative care consulted to also address goals of care.  Highly appreciated.  Assessment/Plan: Active Problems:   Pressure injury of skin   AML (acute myeloid leukemia) (HCC)   Goals of care, counseling/discussion  Generalized weakness most likely secondary to AML versus others Oncology following PT evaluated and recommended home health PT and 24 hours assistance. Fall precautions Encourage increasing p.o. calorie intake Continue Ensure and oral  supplements  Newly diagnosed afib with rvr No reported prior hx of afib Previous hx of cva and dvt Not antilcoagulated due to thrombocytopenia Start cardizem drip Start IV labetalol prn for accelerated HTN Cardiology consulted.  Highly appreciated. Continue to monitor on telemetry  Accelerated HTN Recently hypotensive Start IV labetalol '5mg'$  q2H prn for sbp>160 or dbp>105 Continue to monitor on telemetry  Worsening hypokalemia Potassium 2.9 Repleted with p.o. and IV potassium Added IV magnesium 2 g once  Repeat BMP and magnesium level in the morning   Worsening thrombocytopenia Platelet from 41-18 K Ordered 1 unit of platelet Repeat platelet post transfusion 60K  Repeat CBC in the morning  Newly diagnosed AML First chemo session 09/15/17 C/w close monitoring  Persistent fever most likely related to her leukemia No clear source of active infection T-max of 101.2 this afternoon IV cefepime and IV vancomycin changed to meropenem today 09/16/2017  Completed IV cefepime and IV vancomycin MRI of sacrum no clear sign of osteomyelitis.  Unstageable sacral decubitus ulcer, appears stable Continue IV antibiotics Wound care specialist consulted.  Highly appreciated recommendations. Management as stated above  Pancytopenia most likely secondary to AML Bone marrow biopsy on 09/07/2017 showed 10% blasts Oncology following PICC line placed by interventional radiology Did not get a Port-A-Cath placement due to persistent fevers  Hypertension Blood pressures well stable  Hyperlipidemia Continue statin  History of DVT Previously on Xarelto Aspirin and anticoagulation held due to thrombocytopenia  Hx of CVA Noted  Osteoporosis Continue to hold alendronate Continue to hold vitamin D and calcium  Code Status: Full code  Family Communication: Daughter at bedside  Disposition Plan: Home when clinically stable with home health  PT  Consultants:  Oncology  Interventional radiology  Procedures:  Port-A-Cath placement plan today by interventional radiology   Antimicrobials: IV meropenem  DVT prophylaxis: SCDs   Objective: Vitals:   09/16/17 0945 09/16/17 1015 09/16/17 1130 09/16/17 1200  BP: (!) 107/50 (!) 111/55 (!) 129/59 (!) 141/74  Pulse: 94 (!) 108 (!) 104 100  Resp: (!) 29 (!) 29 (!) 25 (!) 23  Temp: 99.6 F (37.6 C) 99.1 F (37.3 C) 99.1 F (37.3 C)   TempSrc: Oral Oral Oral   SpO2: 95% 94% 96% 95%  Weight:      Height:        Intake/Output Summary (Last 24 hours) at 09/16/2017 1531 Last data filed at 09/16/2017 1448 Gross per 24 hour  Intake 4662.91 ml  Output 3750 ml  Net 912.91 ml   Filed Weights   09/09/2017 1416 09/13/17 1100  Weight: 68.9 kg (152 lb) 67.5 kg (148 lb 13 oz)    Exam:  . General: 83 y.o. year-old female well-developed well-nourished in mild acute distress due to generalized weakness.   . Cardiovascular: Irregular rate and rhythm with no rubs or gallops.  No thyromegaly or JVD noted. Marland Kitchen Respiratory: Clear to auscultation with no wheezes or rales.  Poor inspiratory effort.  .  Abdomen: Soft nontender nondistended with normal bowel sounds x4 quadrants. . Musculoskeletal: No lower extremity edema. 2/4 pulses in all 4 extremities. . Skin: Unstageable sacral decubitus ulcer . Psychiatry: Mood is appropriate for condition and setting   Data Reviewed: CBC: Recent Labs  Lab 09/12/17 0459 09/12/17 2150 09/13/17 0309 09/14/17 0500 09/15/17 0446 09/16/17 0454 09/16/17 1355  WBC 5.5 3.9* 6.5 7.2 7.3 9.0 9.5  NEUTROABS 0.9* 0.8 0.9  --   --   --  PENDING  HGB 10.6* 10.3* 10.4* 10.5* 9.6* 9.5* 9.5*  HCT 33.1* 32.1* 32.8* 33.2* 30.0* 29.6* 29.1*  MCV 95.7 95.3 95.6 95.1 94.0 94.0 94.2  PLT 23* 114* 111* 68* 41* 18* 68*   Basic Metabolic Panel: Recent Labs  Lab 09/11/17 0429 09/12/17 0459 09/13/17 0309 09/14/17 0500 09/15/17 0446 09/16/17 0454  NA 137  138 141 141 142 140  K 4.4 3.9 3.6 3.5 3.3* 2.9*  CL 100* 104 106 104 108 106  CO2 '30 27 27 27 27 26  '$ GLUCOSE 124* 148* 147* 171* 157* 143*  BUN 26* '14 14 16 19 16  '$ CREATININE 1.20* 1.05* 1.07* 1.01* 1.06* 0.97  CALCIUM 8.7* 8.1* 8.1* 8.1* 7.4* 7.6*  MG 2.3  --  1.9  --   --   --   PHOS  --  2.0*  --   --   --   --    GFR: Estimated Creatinine Clearance: 42.9 mL/min (by C-G formula based on SCr of 0.97 mg/dL). Liver Function Tests: Recent Labs  Lab 09/13/17 0309 09/16/17 0454  AST 32 40  ALT 20 26  ALKPHOS 52 48  BILITOT 0.7 0.9  PROT 6.6 6.0*  ALBUMIN 2.5* 2.0*   No results for input(s): LIPASE, AMYLASE in the last 168 hours. No results for input(s): AMMONIA in the last 168 hours. Coagulation Profile: Recent Labs  Lab 09/13/17 0309  INR 1.30   Cardiac Enzymes: No results for input(s): CKTOTAL, CKMB, CKMBINDEX, TROPONINI in the last 168 hours. BNP (last 3 results) No results for input(s): PROBNP in the last 8760 hours. HbA1C: No results for input(s): HGBA1C in the last 72 hours. CBG: No results for input(s): GLUCAP in the last 168 hours. Lipid Profile: No  results for input(s): CHOL, HDL, LDLCALC, TRIG, CHOLHDL, LDLDIRECT in the last 72 hours. Thyroid Function Tests: No results for input(s): TSH, T4TOTAL, FREET4, T3FREE, THYROIDAB in the last 72 hours. Anemia Panel: No results for input(s): VITAMINB12, FOLATE, FERRITIN, TIBC, IRON, RETICCTPCT in the last 72 hours. Urine analysis:    Component Value Date/Time   COLORURINE YELLOW 09/06/2017 0024   APPEARANCEUR HAZY (A) 09/06/2017 0024   LABSPEC 1.008 09/06/2017 0024   PHURINE 7.0 09/06/2017 0024   GLUCOSEU NEGATIVE 09/06/2017 0024   HGBUR NEGATIVE 09/06/2017 0024   BILIRUBINUR NEGATIVE 09/06/2017 0024   KETONESUR NEGATIVE 09/06/2017 0024   PROTEINUR NEGATIVE 09/06/2017 0024   UROBILINOGEN 1.0 05/31/2010 2325   NITRITE NEGATIVE 09/06/2017 0024   LEUKOCYTESUR SMALL (A) 09/06/2017 0024   Sepsis  Labs: '@LABRCNTIP'$ (procalcitonin:4,lacticidven:4)  ) Recent Results (from the past 240 hour(s))  Culture, blood (routine x 2)     Status: None   Collection Time: 09/10/17  8:45 AM  Result Value Ref Range Status   Specimen Description BLOOD LEFT ARM  Final   Special Requests   Final    BOTTLES DRAWN AEROBIC ONLY Blood Culture adequate volume   Culture   Final    NO GROWTH 5 DAYS Performed at [facility], [address], [place], [place] [address]    Report Status 09/15/2017 FINAL  Final  Culture, blood (routine x 2)     Status: None   Collection Time: 09/10/17  8:58 AM  Result Value Ref Range Status   Specimen Description BLOOD LEFT ARM  Final   Special Requests   Final    BOTTLES DRAWN AEROBIC AND ANAEROBIC Blood Culture adequate volume   Culture   Final    NO GROWTH 5 DAYS Performed at [facility], [place] [address] [place], [place] [address]    Report Status 09/15/2017 FINAL  Final  Culture, blood (routine x 2)     Status: None (Preliminary result)   Collection Time: 09/12/17 12:50 PM  Result Value Ref Range Status   Specimen Description   Final    BLOOD RIGHT ANTECUBITAL Performed at [facility] [address] [place], [place] [address]    Special Requests   Final    BOTTLES DRAWN AEROBIC ONLY Blood Culture adequate volume Performed at [facility] [address] [place], [place] [address]    Culture   Final    NO GROWTH 4 DAYS Performed at [facility], [place] [address] [place], [place] [address]    Report Status PENDING  Incomplete  Culture, blood (routine x 2)     Status: None (Preliminary result)   Collection Time: 09/12/17 12:50 PM  Result Value Ref Range Status   Specimen Description   Final    BLOOD RIGHT HAND Performed at [facility] [address] [place], [place] [address]    Special Requests   Final    BOTTLES DRAWN AEROBIC AND ANAEROBIC Blood Culture adequate  volume Performed at [facility] [address] [place], [place] [address]    Culture   Final    NO GROWTH 4 DAYS Performed at [facility], [place] [address] [place], [place] [address]    Report Status PENDING  Incomplete  MRSA PCR Screening     Status: None   Collection Time: 09/12/17  3:38 PM  Result Value Ref Range Status   MRSA by PCR NEGATIVE NEGATIVE Final    Comment:  The GeneXpert MRSA Assay (FDA approved for NASAL specimens only), is one component of a comprehensive MRSA colonization surveillance program. It is not intended to diagnose MRSA infection nor to guide or monitor treatment for MRSA infections. Performed at [facility], [place] [address] [place], [place] [address]       Studies: No results found.  Scheduled Meds: . antiseptic oral rinse  15 mL Mouth Rinse Q4H  . Chlorhexidine Gluconate Cloth  6 each Topical Daily  . collagenase   Topical Daily  . feeding supplement (ENSURE ENLIVE)  237 mL Oral BID BM  . ketorolac  1 drop Both Eyes QID  . mouth rinse  15 mL Mouth Rinse BID  . multivitamin with minerals  1 tablet Oral Daily  . nutrition supplement (JUVEN)  1 packet Oral BID BM  . polyethylene glycol  17 g Oral BID  . potassium chloride  40 mEq Oral BID  . prednisoLONE acetate  1 drop Both Eyes QID  . sodium chloride flush  10-40 mL Intracatheter Q12H    Continuous Infusions: . sodium chloride    . sodium chloride    . sodium chloride 75 mL/hr at 09/16/17 0811  . meropenem (MERREM) IV Stopped (09/16/17 1420)  . vancomycin 750 mg (09/16/17 1445)     LOS: 10 days     Kayleen Memos, MD [facility] Pager [phone]  If 7PM-7AM, please contact night-coverage [email or web address] Password [email or web address] 09/16/2017, 3:31 PM

## 2017-09-16 NOTE — Progress Notes (Signed)
Pharmacy Antibiotic Note  Leah Lewis is a 83 y.o. female admitted on 09/29/2017 with febrile neutropenia.  Pharmacy has been consulted for meropenemdosing. Patient already on vancomycin.  Meropenem being added for ongoing fevers as well as decubitus ulcers, may be related to leukemia. Previous culture results are unrevealing. No obvious source of infection .   Today, 09/16/2017  WBC WNL   Renal: SCr 0.97  + fevers - repeat BCx so far negative    Plan:  Continue Vancomycin 750mg  IV q24h  Meropenem 1 gr IV q12h   Check levels if remains on vancomycin > 4 days  Follow-up ability to narrow antibiotics.    Height: 5\' 4"  (162.6 cm) Weight: 148 lb 13 oz (67.5 kg) IBW/kg (Calculated) : 54.7  Temp (24hrs), Avg:99.9 F (37.7 C), Min:99.1 F (37.3 C), Max:101.2 F (38.4 C)  Recent Labs  Lab 09/12/17 0459 09/12/17 1250 09/12/17 1534 09/12/17 1806 09/12/17 2150 09/13/17 0309 09/14/17 0500 09/15/17 0446 09/16/17 0454  WBC 5.5  --   --   --  3.9* 6.5 7.2 7.3 9.0  CREATININE 1.05*  --   --   --   --  1.07* 1.01* 1.06* 0.97  LATICACIDVEN  --  2.7* 3.1* 1.4  --   --   --   --   --     Estimated Creatinine Clearance: 42.9 mL/min (by C-G formula based on SCr of 0.97 mg/dL).    Allergies  Allergen Reactions  . Percocet [Oxycodone-Acetaminophen] Other (See Comments)    "patient can take" but prefers not to    Antimicrobials this admission: 6/8 cefepime >> 6/14 6/10 vancomycin >>  6/14 meropenem >>   Dose adjustments this admission:  Microbiology results: 6/3 BCx: NGF 6/8 BCx: NGTD  6/10 BCx: NGTD 6/10 MRSA PCR: negative   Thank you for allowing pharmacy to be a part of this patient's care.  Royetta Asal, PharmD, BCPS Pager [phone] 09/16/2017 1:04 PM

## 2017-09-17 ENCOUNTER — Encounter (HOSPITAL_COMMUNITY): Payer: Self-pay | Admitting: Internal Medicine

## 2017-09-17 ENCOUNTER — Inpatient Hospital Stay (HOSPITAL_COMMUNITY): Payer: Medicare Other

## 2017-09-17 DIAGNOSIS — E876 Hypokalemia: Secondary | ICD-10-CM

## 2017-09-17 DIAGNOSIS — C9200 Acute myeloblastic leukemia, not having achieved remission: Principal | ICD-10-CM

## 2017-09-17 DIAGNOSIS — I481 Persistent atrial fibrillation: Secondary | ICD-10-CM

## 2017-09-17 LAB — COMPREHENSIVE METABOLIC PANEL
ALBUMIN: 2.2 g/dL — AB (ref 3.5–5.0)
ALT: 46 U/L (ref 14–54)
AST: 73 U/L — AB (ref 15–41)
Alkaline Phosphatase: 57 U/L (ref 38–126)
Anion gap: 7 (ref 5–15)
BUN: 15 mg/dL (ref 6–20)
CHLORIDE: 104 mmol/L (ref 101–111)
CO2: 29 mmol/L (ref 22–32)
Calcium: 7.6 mg/dL — ABNORMAL LOW (ref 8.9–10.3)
Creatinine, Ser: 0.86 mg/dL (ref 0.44–1.00)
GFR calc Af Amer: >60 mL/min (ref >60)
GLUCOSE: 207 mg/dL — AB (ref 65–99)
POTASSIUM: 3.1 mmol/L — AB (ref 3.5–5.1)
SODIUM: 140 mmol/L (ref 135–145)
Total Bilirubin: 1.5 mg/dL — ABNORMAL HIGH (ref 0.3–1.2)
Total Protein: 6 g/dL — ABNORMAL LOW (ref 6.5–8.1)

## 2017-09-17 LAB — CULTURE, BLOOD (ROUTINE X 2)
Culture: NO GROWTH
Culture: NO GROWTH
Special Requests: ADEQUATE
Special Requests: ADEQUATE

## 2017-09-17 LAB — CBC
HCT: 26.6 % — ABNORMAL LOW (ref 36.0–46.0)
Hemoglobin: 8.8 g/dL — ABNORMAL LOW (ref 12.0–15.0)
MCH: 31.1 pg (ref 26.0–34.0)
MCHC: 33.1 g/dL (ref 30.0–36.0)
MCV: 94 fL (ref 78.0–100.0)
PLATELETS: 42 10*3/uL — AB (ref 150–400)
RBC: 2.83 MIL/uL — AB (ref 3.87–5.11)
RDW: 19.4 % — ABNORMAL HIGH (ref 11.5–15.5)
WBC: 14.9 10*3/uL — AB (ref 4.0–10.5)

## 2017-09-17 LAB — LACTIC ACID, PLASMA
LACTIC ACID, VENOUS: 2.1 mmol/L — AB (ref 0.5–1.9)
Lactic Acid, Venous: 2 mmol/L (ref 0.5–1.9)

## 2017-09-17 LAB — LACTATE DEHYDROGENASE: LDH: 539 U/L — AB (ref 98–192)

## 2017-09-17 LAB — PROCALCITONIN: Procalcitonin: 1.6 ng/mL

## 2017-09-17 LAB — MAGNESIUM: MAGNESIUM: 1.9 mg/dL (ref 1.7–2.4)

## 2017-09-17 LAB — URIC ACID: Uric Acid, Serum: 1.8 mg/dL — ABNORMAL LOW (ref 2.3–6.6)

## 2017-09-17 MED ORDER — POTASSIUM CHLORIDE 10 MEQ/100ML IV SOLN
10.0000 meq | INTRAVENOUS | Status: AC
  Administered 2017-09-17 (×4): 10 meq via INTRAVENOUS
  Filled 2017-09-17 (×4): qty 100

## 2017-09-17 MED ORDER — DECITABINE CHEMO INJECTION 50 MG
20.0000 mg/m2 | Freq: Once | INTRAVENOUS | Status: AC
  Administered 2017-09-17: 35 mg via INTRAVENOUS
  Filled 2017-09-17: qty 7

## 2017-09-17 MED ORDER — PROCHLORPERAZINE MALEATE 10 MG PO TABS
10.0000 mg | ORAL_TABLET | Freq: Once | ORAL | Status: AC
  Administered 2017-09-17: 10 mg via ORAL
  Filled 2017-09-17: qty 1

## 2017-09-17 MED ORDER — SODIUM CHLORIDE 0.9% FLUSH: 3.0000 mL | INTRAVENOUS | Status: DC | PRN

## 2017-09-17 MED ORDER — HEPARIN SOD (PORK) LOCK FLUSH 100 UNIT/ML IV SOLN
250.0000 [IU] | Freq: Once | INTRAVENOUS | Status: DC | PRN
  Filled 2017-09-17: qty 2.5

## 2017-09-17 MED ORDER — MAGNESIUM SULFATE 2 GM/50ML IV SOLN
2.0000 g | Freq: Once | INTRAVENOUS | Status: AC
  Administered 2017-09-17: 2 g via INTRAVENOUS
  Filled 2017-09-17: qty 50

## 2017-09-17 MED ORDER — SODIUM CHLORIDE 0.9% FLUSH: 10.0000 mL | INTRAVENOUS | Status: DC | PRN

## 2017-09-17 MED ORDER — POTASSIUM CHLORIDE CRYS ER 20 MEQ PO TBCR
40.0000 meq | EXTENDED_RELEASE_TABLET | Freq: Two times a day (BID) | ORAL | Status: AC
  Administered 2017-09-17 (×2): 40 meq via ORAL
  Filled 2017-09-17 (×2): qty 2

## 2017-09-17 MED ORDER — DILTIAZEM HCL 60 MG PO TABS
60.0000 mg | ORAL_TABLET | Freq: Four times a day (QID) | ORAL | Status: DC
  Administered 2017-09-17 – 2017-09-19 (×10): 60 mg via ORAL
  Filled 2017-09-17 (×10): qty 1

## 2017-09-17 MED ORDER — HEPARIN SOD (PORK) LOCK FLUSH 100 UNIT/ML IV SOLN
500.0000 [IU] | Freq: Once | INTRAVENOUS | Status: DC | PRN
  Filled 2017-09-17: qty 5

## 2017-09-17 MED ORDER — ALTEPLASE 2 MG IJ SOLR: 2.0000 mg | Freq: Once | INTRAMUSCULAR | Status: DC | PRN

## 2017-09-17 MED ORDER — SODIUM CHLORIDE 0.9 % IV SOLN
Freq: Once | INTRAVENOUS | Status: AC
Start: 2017-09-17 — End: 2017-09-17
  Administered 2017-09-17: 14:00:00 via INTRAVENOUS

## 2017-09-17 NOTE — Progress Notes (Signed)
Chemotherapy dosage and calculations checked and reviewed with Regina Baldwin, RN. 

## 2017-09-17 NOTE — Progress Notes (Signed)
Subjective: The patient is seen and examined today.  She is feeling fine today with no concerning complaints.  She is tolerating her current treatment with decitabine fairly well.  She denied having any fever or chills.  She has no nausea, vomiting, diarrhea or constipation.  She denied having any bleeding issues.  She has no chest pain, shortness of breath or hemoptysis.  Objective: Vital signs in last 24 hours: Temp:  [98.3 F (36.8 C)-101.9 F (38.8 C)] 100.6 F (38.1 C) (06/15 0500) Pulse Rate:  [30-150] 130 (06/15 0900) Resp:  [15-34] 25 (06/15 0900) BP: (100-150)/(48-90) 100/48 (06/15 0900) SpO2:  [86 %-100 %] 94 % (06/15 0900)  Intake/Output from previous day: 06/14 0701 - 06/15 0700 In: 3947.4 [P.O.:1520; I.V.:1872.4; Blood:180; IV Piggyback:375] Out: 25 [Urine:1900] Intake/Output this shift: Total I/O In: 936.7 [P.O.:510; I.V.:270; IV Piggyback:156.7] Out: 200 [Urine:200]  General appearance: alert, cooperative, fatigued and no distress Resp: clear to auscultation bilaterally Cardio: regular rate and rhythm, S1, S2 normal, no murmur, click, rub or gallop GI: soft, non-tender; bowel sounds normal; no masses,  no organomegaly Extremities: extremities normal, atraumatic, no cyanosis or edema  Lab Results:  Recent Labs    09/16/17 1355 09/17/17 0500  WBC 9.5 14.9*  HGB 9.5* 8.8*  HCT 29.1* 26.6*  PLT 68* 42*   BMET Recent Labs    09/16/17 0454 09/17/17 0500  NA 140 140  K 2.9* 3.1*  CL 106 104  CO2 26 29  GLUCOSE 143* 207*  BUN 16 15  CREATININE 0.97 0.86  CALCIUM 7.6* 7.6*    Studies/Results: No results found.  Medications: I have reviewed the patient's current medications.   Assessment/Plan: This is a very pleasant 83 years old [name] recently diagnosed with acute myeloid leukemia.  Because of her age the patient is not a candidate for any aggressive standard treatment for acute myeloid leukemia.  She is currently undergoing  treatment with decitabine.  She is started the first dose of this treatment 2 days ago and has been tolerating it fairly well. We will proceed with day #3 today as scheduled. We will continue to monitor her blood count closely during this course of the treatment. Her platelets count are down to 42,000 but we will proceed with the treatment as planned. For the hypokalemia, she will receive potassium supplements by the primary team. Thank you for taking good care of Leah Lewis, I will continue to follow-up the patient with you during the weekend and Dr. Marin Olp will resume her care on Monday.  Please call if you have any questions.    LOS: 11 days    Leah Lewis 09/17/2017

## 2017-09-17 NOTE — Progress Notes (Addendum)
PROGRESS NOTE  Leah Lewis MVH:[id number] DOB: [birth year] DOA: 09/17/2017 PCP: Wenda Low, MD  HPI/Recap of past 24 hours: [name] is a 83 year old female with past medical history significant for osteoporosis, hyperlipidemia, DVT on Xarelto who presented to the ED with complaints of generalized weakness.  Pancytopenic and found to have AML.  Oncology following.  09/13/2017: Patient seen and examined at bedside she has no new complaints.  Febrile this morning with T-max of 102.9.  Blood cultures drawn yesterday still in process.  On broad-spectrum IV antibiotics and will continue.  MRI of sacrum ordered to rule out osteomyelitis.  09/14/2017: Patient seen and examined with her daughter at bedside.  No new complaints.  Will start chemotherapy treatment tomorrow 09/15/2017.  09/15/17: seen and examined. Appears weak but has no specific complaints. Ordered 12 leads EKG due to IRR on exam. Revealed a-fib. No reported prior hx of afib. Platelet dropped to 41k this am. Denies chest pain, palpitations or dyspnea.  09/16/17: Worsening thrombocytopenia.  Transfused 1 unit platelet.  Significant hypokalemia, repleted with IV and p.o. potassium with IV magnesium.  Cardiology consulted for A. fib RVR.  Patient seen and examined at her bedside with her daughter present.  Reports aching in her upper extremities bilaterally with no significant swelling or tenderness on palpation.  Discussed goals of care with her daughter.  Patient is still full code.  Palliative care consulted to also address goals of care.  Highly appreciated.  09/17/17: Patient seen and examined at bedside.  No new complaints.  She denies chest pain dyspnea or palpitations.  Assessment/Plan: Active Problems:   Pressure injury of skin   AML (acute myeloid leukemia) (HCC)   Goals of care, counseling/discussion   AML (acute myeloblastic leukemia) ([place])   Advance care planning   Palliative care by specialist  Generalized weakness  most likely secondary to AML versus others Oncology following PT evaluated and recommended home health PT and 24 hours assistance. Fall precautions Encourage increasing p.o. calorie intake Continue Ensure and oral supplements Out of bed to chair as tolerated  Newly diagnosed afib with rvr No reported prior hx of afib Previous hx of cva and dvt Not antilcoagulated due to thrombocytopenia Continue Cardizem drip Continue IV labetalol prn for accelerated HTN Cardiology consulted.  Highly appreciated. Continue to monitor on telemetry  Accelerated HTN, improving Recently hypotensive Continue IV labetalol '5mg'$  q2H prn for sbp>160 or dbp>105 Continue to monitor on telemetry  Hypokalemia, improving Repleted with p.o. and IV potassium Added IV magnesium 2 g once  Repeat BMP and magnesium level in the morning   Worsening thrombocytopenia Platelet 48 from 62K Post 2 units of platelet transfusions  repeat CBC in the morning  Leukocytosis Could be reactive or associated with her leukemia versus chemotherapy Procalcitonin 1.6 Lactic acid 2.1 Obtain blood cultures x2 Chest x-ray ordered on 09/17/2017 personally reviewed revealed increase in pulmonary vascularity bilaterally with suspicion for pulmonary edema  Acute pulmonary edema Possibly 2/2 to transfusions vs others Stop IV fluid O2 sat  94-98% on RA If hypoxic give Lasix one-time dose, monitor urine output and symptoms  Chronic diastolic CHF Last 2D echo done on 09/07/2017 revealed LVEF 60 to 65% with grade 1 diastolic dysfunction Monitor volume status Continue to monitor strict I's and O's and daily weight  Suspected hemolysis Elevated LDH and elevated total bilirubin Currently undergoing chemotherapy Continue to monitor  Newly diagnosed AML First chemo session 09/15/17 C/w close monitoring  Persistent fever most likely related to her leukemia  No clear source of active infection T-max of 101.2 this afternoon IV cefepime  and IV vancomycin changed to meropenem  09/16/2017  Completed IV cefepime and IV vancomycin MRI of sacrum no clear sign of osteomyelitis. Continue meropenem  Unstageable sacral decubitus ulcer, appears stable Continue IV antibiotics Wound care specialist consulted.  Highly appreciated recommendations. Management as stated above  Pancytopenia most likely secondary to AML Bone marrow biopsy on 09/07/2017 showed 10% blasts Oncology following PICC line placed by interventional radiology Did not get a Port-A-Cath placement due to persistent fevers  Hypertension Blood pressures well stable  Hyperlipidemia Continue statin  History of DVT Previously on Xarelto Aspirin and anticoagulation held due to thrombocytopenia  Hx of CVA Noted  Osteoporosis Continue to hold alendronate Continue to hold vitamin D and calcium  Code Status: Full code  Family Communication: Daughter at bedside  Disposition Plan: Home when clinically stable with home health PT   Consultants:  Oncology  Interventional radiology  Procedures:  Port-A-Cath placement plan today by interventional radiology   Antimicrobials: IV meropenem  DVT prophylaxis: SCDs   Objective: Vitals:   09/17/17 1200 09/17/17 1329 09/17/17 1330 09/17/17 1400  BP: (!) 109/49 (!) 143/110 (!) 129/49 (!) 119/53  Pulse: 89 78 81 80  Resp: (!) 38 (!) 30 (!) 40 (!) 42  Temp: 99.4 F (37.4 C)     TempSrc: Oral     SpO2: 94% 97% 96% 94%  Weight:      Height:        Intake/Output Summary (Last 24 hours) at 09/17/2017 1449 Last data filed at 09/17/2017 1400 Gross per 24 hour  Intake 4229.88 ml  Output 1050 ml  Net 3179.88 ml   Filed Weights   09/04/2017 1416 09/13/17 1100  Weight: 68.9 kg (152 lb) 67.5 kg (148 lb 13 oz)    Exam:  . General: 83 y.o. year-old female well-developed well-nourished no acute distress.  Alert in the setting of dementia. . Cardiovascular: Irregular rate and rhythm with no rubs or gallops.  No  JVD or thyromegaly noted. Marland Kitchen Respiratory: Clear to auscultation with no wheezes or rales.  Poor inspiratory effort.  .  Abdomen: Soft nontender nondistended with normal bowel sounds x4 quadrants. . Musculoskeletal: No lower extremity edema. 2/4 pulses in all 4 extremities. . Skin: Unstageable sacral decubitus ulcer . Psychiatry: Mood is appropriate for condition and setting   Data Reviewed: CBC: Recent Labs  Lab 09/12/17 0459 09/12/17 2150 09/13/17 0309 09/14/17 0500 09/15/17 0446 09/16/17 0454 09/16/17 1355 09/17/17 0500  WBC 5.5 3.9* 6.5 7.2 7.3 9.0 9.5 14.9*  NEUTROABS 0.9* 0.8 0.9  --   --   --  2.3  --   HGB 10.6* 10.3* 10.4* 10.5* 9.6* 9.5* 9.5* 8.8*  HCT 33.1* 32.1* 32.8* 33.2* 30.0* 29.6* 29.1* 26.6*  MCV 95.7 95.3 95.6 95.1 94.0 94.0 94.2 94.0  PLT 23* 114* 111* 68* 41* 18* 68* 42*   Basic Metabolic Panel: Recent Labs  Lab 09/11/17 0429 09/12/17 0459 09/13/17 0309 09/14/17 0500 09/15/17 0446 09/16/17 0454 09/17/17 0500  NA 137 138 141 141 142 140 140  K 4.4 3.9 3.6 3.5 3.3* 2.9* 3.1*  CL 100* 104 106 104 108 106 104  CO2 '30 27 27 27 27 26 29  '$ GLUCOSE 124* 148* 147* 171* 157* 143* 207*  BUN 26* '14 14 16 19 16 15  '$ CREATININE 1.20* 1.05* 1.07* 1.01* 1.06* 0.97 0.86  CALCIUM 8.7* 8.1* 8.1* 8.1* 7.4* 7.6* 7.6*  MG 2.3  --  1.9  --   --   --  1.9  PHOS  --  2.0*  --   --   --   --   --    GFR: Estimated Creatinine Clearance: 48.4 mL/min (by C-G formula based on SCr of 0.86 mg/dL). Liver Function Tests: Recent Labs  Lab 09/13/17 0309 09/16/17 0454 09/17/17 0500  AST 32 40 73*  ALT 20 26 46  ALKPHOS 52 48 57  BILITOT 0.7 0.9 1.5*  PROT 6.6 6.0* 6.0*  ALBUMIN 2.5* 2.0* 2.2*   No results for input(s): LIPASE, AMYLASE in the last 168 hours. No results for input(s): AMMONIA in the last 168 hours. Coagulation Profile: Recent Labs  Lab 09/13/17 0309  INR 1.30   Cardiac Enzymes: No results for input(s): CKTOTAL, CKMB, CKMBINDEX, TROPONINI in the last  168 hours. BNP (last 3 results) No results for input(s): PROBNP in the last 8760 hours. HbA1C: No results for input(s): HGBA1C in the last 72 hours. CBG: No results for input(s): GLUCAP in the last 168 hours. Lipid Profile: No results for input(s): CHOL, HDL, LDLCALC, TRIG, CHOLHDL, LDLDIRECT in the last 72 hours. Thyroid Function Tests: No results for input(s): TSH, T4TOTAL, FREET4, T3FREE, THYROIDAB in the last 72 hours. Anemia Panel: No results for input(s): VITAMINB12, FOLATE, FERRITIN, TIBC, IRON, RETICCTPCT in the last 72 hours. Urine analysis:    Component Value Date/Time   COLORURINE YELLOW 09/06/2017 0024   APPEARANCEUR HAZY (A) 09/06/2017 0024   LABSPEC 1.008 09/06/2017 0024   PHURINE 7.0 09/06/2017 0024   GLUCOSEU NEGATIVE 09/06/2017 0024   HGBUR NEGATIVE 09/06/2017 0024   BILIRUBINUR NEGATIVE 09/06/2017 0024   KETONESUR NEGATIVE 09/06/2017 0024   PROTEINUR NEGATIVE 09/06/2017 0024   UROBILINOGEN 1.0 05/31/2010 2325   NITRITE NEGATIVE 09/06/2017 0024   LEUKOCYTESUR SMALL (A) 09/06/2017 0024   Sepsis Labs: '@LABRCNTIP'$ (procalcitonin:4,lacticidven:4)  ) Recent Results (from the past 240 hour(s))  Culture, blood (routine x 2)     Status: None   Collection Time: 09/10/17  8:45 AM  Result Value Ref Range Status   Specimen Description BLOOD LEFT ARM  Final   Special Requests   Final    BOTTLES DRAWN AEROBIC ONLY Blood Culture adequate volume   Culture   Final    NO GROWTH 5 DAYS Performed at [facility], [address] [place], [place] [address]    Report Status 09/15/2017 FINAL  Final  Culture, blood (routine x 2)     Status: None   Collection Time: 09/10/17  8:58 AM  Result Value Ref Range Status   Specimen Description BLOOD LEFT ARM  Final   Special Requests   Final    BOTTLES DRAWN AEROBIC AND ANAEROBIC Blood Culture adequate volume   Culture   Final    NO GROWTH 5 DAYS Performed at [facility], [place] [address], [place], [place] [address]     Report Status 09/15/2017 FINAL  Final  Culture, blood (routine x 2)     Status: None   Collection Time: 09/12/17 12:50 PM  Result Value Ref Range Status   Specimen Description   Final    BLOOD RIGHT ANTECUBITAL Performed at [facility] [address], [place], [place] [address]    Special Requests   Final    BOTTLES DRAWN AEROBIC ONLY Blood Culture adequate volume Performed at [facility] [address], [place], [place] [address]    Culture   Final    NO GROWTH  5 DAYS Performed at [facility], [place] [address] [place], [place] [address]    Report Status 09/17/2017 FINAL  Final  Culture, blood (routine x 2)     Status: None   Collection Time: 09/12/17 12:50 PM  Result Value Ref Range Status   Specimen Description   Final    BLOOD RIGHT HAND Performed at [facility] [address] [place], [place] [address]    Special Requests   Final    BOTTLES DRAWN AEROBIC AND ANAEROBIC Blood Culture adequate volume Performed at [facility] [address] [place], [place] [address]    Culture   Final    NO GROWTH 5 DAYS Performed at [facility], [place] [address], [place], [place] [address]    Report Status 09/17/2017 FINAL  Final  MRSA PCR Screening     Status: None   Collection Time: 09/12/17  3:38 PM  Result Value Ref Range Status   MRSA by PCR NEGATIVE NEGATIVE Final    Comment:        The GeneXpert MRSA Assay (FDA approved for NASAL specimens only), is one component of a comprehensive MRSA colonization surveillance program. It is not intended to diagnose MRSA infection nor to guide or monitor treatment for MRSA infections. Performed at [facility], [place] [address], [place], [place] [address]       Studies: Dg Chest Port 1 View  Result Date: 09/17/2017 CLINICAL DATA:  Fever EXAM: PORTABLE CHEST 1 VIEW COMPARISON:  09/12/2017 FINDINGS: There is cardiomegaly.  Vascular congestion. Bilateral patchy airspace opacities, left greater than right. This could reflect asymmetric edema or infection. No effusions. No acute bony abnormality. Right PICC line is in place with the tip at the cavoatrial junction. IMPRESSION: Patchy bilateral airspace disease, left greater than right could reflect asymmetric edema or infection. Electronically Signed   By: Rolm Baptise M.D.   On: 09/17/2017 09:38    Scheduled Meds: . antiseptic oral rinse  15 mL Mouth Rinse Q4H  . Chlorhexidine Gluconate Cloth  6 each Topical Daily  . collagenase   Topical Daily  . diltiazem  60 mg Oral Q6H  . feeding supplement (ENSURE ENLIVE)  237 mL Oral BID BM  . ketorolac  1 drop Both Eyes QID  . mouth rinse  15 mL Mouth Rinse BID  . multivitamin with minerals  1 tablet Oral Daily  . nutrition supplement (JUVEN)  1 packet Oral BID BM  . polyethylene glycol  17 g Oral BID  . potassium chloride  40 mEq Oral BID  . prednisoLONE acetate  1 drop Both Eyes QID  . sodium chloride flush  10-40 mL Intracatheter Q12H    Continuous Infusions: . sodium chloride    . sodium chloride    . sodium chloride Stopped (09/17/17 1404)  . meropenem (MERREM) IV Stopped (09/17/17 1016)     LOS: 11 days     Kayleen Memos, MD [facility] Pager [phone]  If 7PM-7AM, please contact night-coverage [email or web address] Password [facility] 09/17/2017, 2:49 PM

## 2017-09-17 NOTE — Progress Notes (Signed)
Patient tolerated chemotherapy well.  

## 2017-09-17 NOTE — Progress Notes (Signed)
CRITICAL VALUE ALERT  Critical Value:  Lactic Acid 2.1  Date & Time Notied:  09/17/2017 1030  Provider Notified: Nevada Crane  Orders Received/Actions taken: No new orders at this time

## 2017-09-17 NOTE — Consult Note (Signed)
CARDIOLOGY CONSULT NOTE     Primary Care Physician: Wenda Low, MD Referring Physician:  Dr Mertie Moores Date: 09/09/2017  Reason for consultation: AFib  Leah Lewis is a 83 y.o. female admitted with AML, now with afib with RVR.  She does not appear to have a history of afib.  She has been previously anticoagulated for DVT. She is admitted with AML.  She has profound thrombocytopenia and also anemia.  She converted to atrial fibrillation while here.  She is not currently symptomatic.  V rates are mostly 90s-110s.  She is currently stable on IV diltiazem.  Anticoagulation has been discontinued due to thrombocytopenia.   Today, she denies symptoms of palpitations, chest pain, shortness of breath, orthopnea, PND, lower extremity edema, dizziness, presyncope, syncope, or neurologic sequela. The patient is tolerating medications without difficulties and is otherwise without complaint today.   Past Medical History:  Diagnosis Date  . AML (acute myeloid leukemia) ([place]) 09/12/2017  . Atrial fibrillation ([place]) 09/15/2017  . Goals of care, counseling/discussion 09/12/2017  . High cholesterol   . Hypertension   . Stroke [facility])    Past Surgical History:  Procedure Laterality Date  . ABDOMINAL HYSTERECTOMY    . CATARACT EXTRACTION     bilateral  . CESAREAN SECTION      . antiseptic oral rinse  15 mL Mouth Rinse Q4H  . Chlorhexidine Gluconate Cloth  6 each Topical Daily  . collagenase   Topical Daily  . diltiazem  60 mg Oral Q6H  . feeding supplement (ENSURE ENLIVE)  237 mL Oral BID BM  . ketorolac  1 drop Both Eyes QID  . mouth rinse  15 mL Mouth Rinse BID  . multivitamin with minerals  1 tablet Oral Daily  . nutrition supplement (JUVEN)  1 packet Oral BID BM  . polyethylene glycol  17 g Oral BID  . potassium chloride  40 mEq Oral BID  . prednisoLONE acetate  1 drop Both Eyes QID  . sodium chloride flush  10-40 mL Intracatheter Q12H   . sodium chloride    . sodium chloride      . sodium chloride 75 mL/hr at 09/17/17 0600  . magnesium sulfate 1 - 4 g bolus IVPB 2 g (09/17/17 0759)  . meropenem (MERREM) IV Stopped (09/16/17 2208)  . potassium chloride      Allergies  Allergen Reactions  . Percocet [Oxycodone-Acetaminophen] Other (See Comments)    "patient can take" but prefers not to    Social History   Socioeconomic History  . Marital status: Single    Spouse name: Not on file  . Number of children: Not on file  . Years of education: Not on file  . Highest education level: Not on file  Occupational History  . Not on file  Social Needs  . Financial resource strain: Not on file  . Food insecurity:    Worry: Not on file    Inability: Not on file  . Transportation needs:    Medical: Not on file    Non-medical: Not on file  Tobacco Use  . Smoking status: Never Smoker  . Smokeless tobacco: Former Systems developer    Types: Chew  Substance and Sexual Activity  . Alcohol use: No  . Drug use: No  . Sexual activity: Not on file  Lifestyle  . Physical activity:    Days per week: Not on file    Minutes per session: Not on file  . Stress: Not on file  Relationships  . Social connections:    Talks on phone: Not on file    Gets together: Not on file    Attends religious service: Not on file    Active member of club or organization: Not on file    Attends meetings of clubs or organizations: Not on file    Relationship status: Not on file  . Intimate partner violence:    Fear of current or ex partner: Not on file    Emotionally abused: Not on file    Physically abused: Not on file    Forced sexual activity: Not on file  Other Topics Concern  . Not on file  Social History Narrative  . Not on file    Family History  Problem Relation Age of Onset  . Heart attack Mother   . Pneumonia Father   . Parkinsonism Father   . Breast cancer Sister     ROS- All systems are reviewed and negative except as per the HPI above  Physical Exam: Telemetry: Vitals:    09/17/17 0600 09/17/17 0630 09/17/17 0700 09/17/17 0800  BP: (!) 105/54 138/66 (!) 134/51 (!) 120/48  Pulse: (!) 103 94 (!) 109 (!) 150  Resp: 20 (!) 34 (!) 32 (!) 30  Temp:      TempSrc:      SpO2: 92% 93% 93% 95%  Weight:      Height:        GEN- The patient is thin, frail, and ill appearing, alert and oriented x 3 today.   Head- normocephalic, atraumatic Eyes- R traumatic pupil Ears- hearing intact Oropharynx- clear Neck- supple, no JVP Lungs- Clear to ausculation bilaterally, normal work of breathing Heart- irregular rate and rhythm  GI- soft, NT, ND, + BS Extremities- no clubbing, cyanosis, or edema MS- diffuse atrophy Skin- no rash or lesion Psych- euthymic mood, full affect Neuro- strength and sensation are intact  EKG:  Afib, V rate 105 bpm, no ischemic changes  Labs:   Lab Results  Component Value Date   WBC 14.9 (H) 09/17/2017   HGB 8.8 (L) 09/17/2017   HCT 26.6 (L) 09/17/2017   MCV 94.0 09/17/2017   PLT 42 (L) 09/17/2017    Recent Labs  Lab 09/17/17 0500  NA 140  K 3.1*  CL 104  CO2 29  BUN 15  CREATININE 0.86  CALCIUM 7.6*  PROT 6.0*  BILITOT 1.5*  ALKPHOS 57  ALT 46  AST 73*  GLUCOSE 207*   Lab Results  Component Value Date   CKTOTAL 176 02/22/2011   CKMB 3.1 02/22/2011   TROPONINI <0.30 02/22/2011    Lab Results  Component Value Date   CHOL 155 04/21/2011   CHOL  06/01/2010    186        ATP III CLASSIFICATION:  <200     mg/dL   Desirable  200-239  mg/dL   Borderline High  >=240    mg/dL   High          CHOL  04/04/2007    191        ATP III CLASSIFICATION:  <200     mg/dL   Desirable  200-239  mg/dL   Borderline High  >=240    mg/dL   High   Lab Results  Component Value Date   HDL 49 04/21/2011   HDL 72 06/01/2010   HDL 39 (L) 04/04/2007   Lab Results  Component Value Date   LDLCALC 88 04/21/2011  LDLCALC (H) 06/01/2010    108        Total Cholesterol/HDL:CHD Risk Coronary Heart Disease Risk Table                      Men   Women  1/2 Average Risk   3.4   3.3  Average Risk       5.0   4.4  2 X Average Risk   9.6   7.1  3 X Average Risk  23.4   11.0        Use the calculated Patient Ratio above and the CHD Risk Table to determine the patient's CHD Risk.        ATP III CLASSIFICATION (LDL):  <100     mg/dL   Optimal  100-129  mg/dL   Near or Above                    Optimal  130-159  mg/dL   Borderline  160-189  mg/dL   High  >190     mg/dL   Very High   LDLCALC (H) 04/04/2007    134        Total Cholesterol/HDL:CHD Risk Coronary Heart Disease Risk Table                     Men   Women  1/2 Average Risk   3.4   3.3   Lab Results  Component Value Date   TRIG 89 04/21/2011   TRIG 29 06/01/2010   TRIG 88 04/04/2007   Lab Results  Component Value Date   CHOLHDL 3.2 04/21/2011   CHOLHDL 2.6 06/01/2010   CHOLHDL 4.9 04/04/2007   No results found for: LDLDIRECT     Echo: 09/07/17 reviewed,  EF 60%, mild AI, mildRA enlargement, LA normal size, mild TR  ASSESSMENT AND PLAN:   1. afib (new onset) The patient has converted to afib in the setting of acute medical illness.  She is currently reasonably rate controlled, stable, and mostly unaware of her AF. chads2vasc sore is at least 6.  She also has h/o DVT.  She is currently off of anticoagulation due to thrombocytopenia and anemia.  I agree that she is currently not a candidate for [place] due to low Plts and anemia.  Once these issues are improved, we should reconsider.  There is probably no role for aspirin in this patient going forward. Continue rate control.  Convert IV diltiazem to oral. We cannot pursue sinus rhythm until she is adequately anticoagulated again.  2. HTN Stable No change required today  3. Hypokalemia Primary team to replete  Ok to transfer to telemetry floor when other issues stabilize. Cardiology to follow  Thompson Grayer, MD 09/17/2017  8:40 AM

## 2017-09-17 NOTE — Progress Notes (Signed)
CRITICAL VALUE ALERT  Critical Value:  Lactic Acid 2  Date & Time Notied:  09/17/2017 0900  Provider Notified: [place] Crane  Orders Received/Actions taken: CXR orders placed.

## 2017-09-18 DIAGNOSIS — I4891 Unspecified atrial fibrillation: Secondary | ICD-10-CM

## 2017-09-18 LAB — COMPREHENSIVE METABOLIC PANEL
ALBUMIN: 2.2 g/dL — AB (ref 3.5–5.0)
ALK PHOS: 59 U/L (ref 38–126)
ALT: 47 U/L (ref 14–54)
AST: 61 U/L — AB (ref 15–41)
Anion gap: 10 (ref 5–15)
BUN: 19 mg/dL (ref 6–20)
CHLORIDE: 105 mmol/L (ref 101–111)
CO2: 27 mmol/L (ref 22–32)
CREATININE: 0.86 mg/dL (ref 0.44–1.00)
Calcium: 7.8 mg/dL — ABNORMAL LOW (ref 8.9–10.3)
GFR calc non Af Amer: >60 mL/min (ref >60)
GLUCOSE: 181 mg/dL — AB (ref 65–99)
Potassium: 3.8 mmol/L (ref 3.5–5.1)
SODIUM: 142 mmol/L (ref 135–145)
Total Bilirubin: 1.3 mg/dL — ABNORMAL HIGH (ref 0.3–1.2)
Total Protein: 6.4 g/dL — ABNORMAL LOW (ref 6.5–8.1)

## 2017-09-18 LAB — CBC
HCT: 28.4 % — ABNORMAL LOW (ref 36.0–46.0)
HEMOGLOBIN: 9.1 g/dL — AB (ref 12.0–15.0)
MCH: 29.8 pg (ref 26.0–34.0)
MCHC: 32 g/dL (ref 30.0–36.0)
MCV: 93.1 fL (ref 78.0–100.0)
Platelets: 23 10*3/uL — CL (ref 150–400)
RBC: 3.05 MIL/uL — AB (ref 3.87–5.11)
RDW: 20 % — ABNORMAL HIGH (ref 11.5–15.5)
WBC: 18.4 10*3/uL — AB (ref 4.0–10.5)

## 2017-09-18 LAB — LACTIC ACID, PLASMA: Lactic Acid, Venous: 2.7 mmol/L (ref 0.5–1.9)

## 2017-09-18 LAB — LACTATE DEHYDROGENASE: LDH: 618 U/L — AB (ref 98–192)

## 2017-09-18 MED ORDER — PROCHLORPERAZINE MALEATE 10 MG PO TABS
10.0000 mg | ORAL_TABLET | Freq: Once | ORAL | Status: AC
  Administered 2017-09-18: 10 mg via ORAL
  Filled 2017-09-18: qty 1

## 2017-09-18 MED ORDER — HEPARIN SOD (PORK) LOCK FLUSH 100 UNIT/ML IV SOLN
250.0000 [IU] | Freq: Once | INTRAVENOUS | Status: DC | PRN
  Filled 2017-09-18: qty 2.5

## 2017-09-18 MED ORDER — HEPARIN SOD (PORK) LOCK FLUSH 100 UNIT/ML IV SOLN
500.0000 [IU] | Freq: Once | INTRAVENOUS | Status: DC | PRN
  Filled 2017-09-18: qty 5

## 2017-09-18 MED ORDER — SODIUM CHLORIDE 0.9% FLUSH: 10.0000 mL | INTRAVENOUS | Status: DC | PRN

## 2017-09-18 MED ORDER — SODIUM CHLORIDE 0.9% FLUSH: 3.0000 mL | INTRAVENOUS | Status: DC | PRN

## 2017-09-18 MED ORDER — ALTEPLASE 2 MG IJ SOLR
2.0000 mg | Freq: Once | INTRAMUSCULAR | Status: AC
  Administered 2017-09-18: 2 mg

## 2017-09-18 MED ORDER — SODIUM CHLORIDE 0.9 % IV SOLN
20.0000 mg/m2 | Freq: Once | INTRAVENOUS | Status: AC
  Administered 2017-09-18: 35 mg via INTRAVENOUS
  Filled 2017-09-18: qty 7

## 2017-09-18 MED ORDER — SODIUM CHLORIDE 0.9 % IV SOLN
Freq: Once | INTRAVENOUS | Status: AC
  Administered 2017-09-18: 15:00:00 via INTRAVENOUS

## 2017-09-18 MED ORDER — ALTEPLASE 2 MG IJ SOLR
2.0000 mg | Freq: Once | INTRAMUSCULAR | Status: DC | PRN
  Filled 2017-09-18: qty 2

## 2017-09-18 NOTE — Progress Notes (Signed)
Subjective: The patient is seen and examined today.  She is feeling fine today with no concerning complaints.  She denied having any fever or chills.  She has no nausea, vomiting, diarrhea or constipation.  She continues to tolerate her treatment with chemotherapy fairly well.  Objective: Vital signs in last 24 hours: Temp:  [98.2 F (36.8 C)-100.3 F (37.9 C)] 98.2 F (36.8 C) (06/16 0400) Pulse Rate:  [61-150] 73 (06/15 2000) Resp:  [25-45] 40 (06/15 2000) BP: (96-152)/(42-110) 134/104 (06/16 0735) SpO2:  [93 %-97 %] 93 % (06/15 2000)  Intake/Output from previous day: 06/15 0701 - 06/16 0700 In: 1715.4 [P.O.:680; I.V.:668.8; IV Piggyback:356.7] Out: 1400 [Urine:1400] Intake/Output this shift: No intake/output data recorded.  General appearance: alert, cooperative, fatigued and no distress Resp: clear to auscultation bilaterally Cardio: irregularly irregular rhythm GI: soft, non-tender; bowel sounds normal; no masses,  no organomegaly Extremities: extremities normal, atraumatic, no cyanosis or edema  Lab Results:  Recent Labs    09/16/17 1355 09/17/17 0500  WBC 9.5 14.9*  HGB 9.5* 8.8*  HCT 29.1* 26.6*  PLT 68* 42*   BMET Recent Labs    09/16/17 0454 09/17/17 0500  NA 140 140  K 2.9* 3.1*  CL 106 104  CO2 26 29  GLUCOSE 143* 207*  BUN 16 15  CREATININE 0.97 0.86  CALCIUM 7.6* 7.6*    Studies/Results: Dg Chest Port 1 View  Result Date: 09/17/2017 CLINICAL DATA:  Fever EXAM: PORTABLE CHEST 1 VIEW COMPARISON:  09/12/2017 FINDINGS: There is cardiomegaly. Vascular congestion. Bilateral patchy airspace opacities, left greater than right. This could reflect asymmetric edema or infection. No effusions. No acute bony abnormality. Right PICC line is in place with the tip at the cavoatrial junction. IMPRESSION: Patchy bilateral airspace disease, left greater than right could reflect asymmetric edema or infection. Electronically Signed   By: Rolm Baptise M.D.   On:  09/17/2017 09:38    Medications: I have reviewed the patient's current medications.  Assessment/Plan: This is a very pleasant 83 years old [name] recently diagnosed with acute myeloid leukemia and currently undergoing treatment with decitabine status post 3 days of treatment of the first cycle.  She has been tolerating this treatment well with no concerning complaints.  I recommended for the patient to proceed with day #4 of her chemotherapy as scheduled today.  Her CBC is still pending but we will monitor closely and consider The patient for supportive transfusion if needed. For the atrial fibrillation she is currently on Cardizem. Dr. Marin Olp will see the patient again starting tomorrow for close monitoring of her condition and chemotherapy.  LOS: 12 days    Leah Lewis 09/18/2017

## 2017-09-18 NOTE — Progress Notes (Signed)
PROGRESS NOTE  Leah Lewis CWU:[id number] DOB: 10-Jun-1934 DOA: 09/24/2017 PCP: Wenda Low, MD  HPI/Recap of past 24 hours: [name] is a 83 year old female with past medical history significant for osteoporosis, hyperlipidemia, DVT on Xarelto who presented to the ED with complaints of generalized weakness.  Pancytopenic and found to have AML.  Oncology following.  09/13/2017: Patient seen and examined at bedside she has no new complaints.  Febrile this morning with T-max of 102.9.  Blood cultures drawn yesterday still in process.  On broad-spectrum IV antibiotics and will continue.  MRI of sacrum ordered to rule out osteomyelitis.  09/14/2017: Patient seen and examined with her daughter at bedside.  No new complaints.  Will start chemotherapy treatment tomorrow 09/15/2017.  09/15/17: seen and examined. Appears weak but has no specific complaints. Ordered 12 leads EKG due to IRR on exam. Revealed a-fib. No reported prior hx of afib. Platelet dropped to 41k this am. Denies chest pain, palpitations or dyspnea.  09/16/17: Worsening thrombocytopenia.  Transfused 1 unit platelet.  Significant hypokalemia, repleted with IV and p.o. potassium with IV magnesium.  Cardiology consulted for A. fib RVR.  Patient seen and examined at her bedside with her daughter present.  Reports aching in her upper extremities bilaterally with no significant swelling or tenderness on palpation.  Discussed goals of care with her daughter.  Patient is still full code.  Palliative care consulted to also address goals of care.  Highly appreciated.  09/17/17: Patient seen and examined at bedside.  No new complaints.  She denies chest pain dyspnea or palpitations.  09/18/2017: Patient seen and examined at bedside.  Reports he feels tired.  Denies chest pain dyspnea palpitations nausea or abdominal pain.  Assessment/Plan: Active Problems:   Pressure injury of skin   AML (acute myeloid leukemia) (HCC)   Goals of care,  counseling/discussion   AML (acute myeloblastic leukemia) ([place])   Advance care planning   Palliative care by specialist  Persistent generalized weakness most likely secondary to AML versus others Oncology following PT evaluated and recommended home health PT and 24 hours assistance. Fall precautions Encourage increasing p.o. calorie intake Continue Ensure and oral supplements Out of bed to chair as tolerated  Worsening thrombocytopenia Likely secondary to AML Platelet 23K from 42K Post 2 unit platelet during this hospitalization Start additional 1 unit platelet on 09/12/2017 Repeat CBC in the morning  Newly diagnosed AML First chemo session 09/15/17 C/w close monitoring  Newly diagnosed afib with rvr Diltiazem drip stopped by cardiology 09/17/2017 Started on p.o. Cardizem 60 mg every 6 hours No reported prior hx of afib Previous hx of cva and dvt Not antilcoagulated due to thrombocytopenia Continue IV labetalol prn for accelerated HTN Cardiology following  Accelerated hypertension, improving Stop IV fluid Continue p.o. Cardizem, labetalol IV as needed Continue IV labetalol 5mg  q2H prn for sbp>160 or dbp>105 Continue to monitor on telemetry  Hypo-kalemia, resolved  Potassium 3.8 from 3.1 Repleted with p.o. and IV potassium Added IV magnesium 2 g once   Worsening leukocytosis WBC 18 from 14k Could be reactive or associated with her leukemia versus treatment Procalcitonin 1.6 Lactic acid 2.1 Blood cultures x2 in process Chest x-ray ordered on 09/17/2017 personally reviewed revealed increase in pulmonary vascularity bilaterally with suspicion for pulmonary edema  Acute pulmonary edema Possibly 2/2 to transfusions vs others Stop IV fluid O2 sat  94-98% on RA If hypoxic give Lasix one-time dose, monitor urine output and symptoms  Chronic diastolic CHF Last 2D echo done  on 09/07/2017 revealed LVEF 60 to 65% with grade 1 diastolic dysfunction Monitor volume  status Continue to monitor strict I's and O's and daily weight  Suspected hemolysis Elevated LDH and elevated total bilirubin Currently undergoing chemotherapy Continue to monitor  Persistent fever most likely related to her leukemia No clear source of active infection IV cefepime and IV vancomycin changed to meropenem  09/16/2017  Completed IV cefepime and IV vancomycin MRI of sacrum no clear sign of osteomyelitis. Continue meropenem  Unstageable sacral decubitus ulcer, appears stable Continue IV antibiotics Wound care specialist consulted.  Highly appreciated recommendations. Management as stated above  Hyperlipidemia Continue statin  History of DVT Previously on Xarelto Aspirin and anticoagulation held due to thrombocytopenia  Hx of CVA Noted  Osteoporosis Continue to hold alendronate Continue to hold vitamin D and calcium  Code Status: Full code  Family Communication: Daughter at bedside  Disposition Plan: Home when clinically stable with home health PT   Consultants:  Oncology  Interventional radiology  Procedures:  None  Antimicrobials: IV meropenem  DVT prophylaxis: SCDs   Objective: Vitals:   09/18/17 0400 09/18/17 0735 09/18/17 0900 09/18/17 1100  BP: (!) 96/44 (!) 134/104 140/81 (!) 139/43  Pulse: 64   (!) 132  Resp: (!) 34  (!) 30 (!) 36  Temp: 98.2 F (36.8 C) 98.2 F (36.8 C)    TempSrc: Oral Oral    SpO2: 98%   96%  Weight:      Height:        Intake/Output Summary (Last 24 hours) at 09/18/2017 1125 Last data filed at 09/18/2017 0800 Gross per 24 hour  Intake 355 ml  Output 1650 ml  Net -1295 ml   Filed Weights   09/09/2017 1416 09/13/17 1100  Weight: 68.9 kg (152 lb) 67.5 kg (148 lb 13 oz)    Exam:  . General: 83 y.o. year-old female well-developed well-nourished in no acute distress.  Alert in the setting of dementia.  Cardiovascular: Irregular rate and rhythm with no rubs or gallops.  No JVD or thyromegaly  noted. Marland Kitchen Respiratory: Clear to auscultation with no wheezes or rales.  Poor inspiratory effort.  .  Abdomen: Soft nontender nondistended with normal bowel sounds x4 quadrants. . Musculoskeletal: No lower extremity edema. 2/4 pulses in all 4 extremities. . Skin: Unstageable sacral decubitus ulcer . Psychiatry: Mood is appropriate for condition and setting   Data Reviewed: CBC: Recent Labs  Lab 09/12/17 0459 09/12/17 2150 09/13/17 0309  09/15/17 0446 09/16/17 0454 09/16/17 1355 09/17/17 0500 09/18/17 0527  WBC 5.5 3.9* 6.5   < > 7.3 9.0 9.5 14.9* 18.4*  NEUTROABS 0.9* 0.8 0.9  --   --   --  2.3  --   --   HGB 10.6* 10.3* 10.4*   < > 9.6* 9.5* 9.5* 8.8* 9.1*  HCT 33.1* 32.1* 32.8*   < > 30.0* 29.6* 29.1* 26.6* 28.4*  MCV 95.7 95.3 95.6   < > 94.0 94.0 94.2 94.0 93.1  PLT 23* 114* 111*   < > 41* 18* 68* 42* 23*   < > = values in this interval not displayed.   Basic Metabolic Panel: Recent Labs  Lab 09/12/17 0459 09/13/17 0309 09/14/17 0500 09/15/17 0446 09/16/17 0454 09/17/17 0500 09/18/17 0527  NA 138 141 141 142 140 140 142  K 3.9 3.6 3.5 3.3* 2.9* 3.1* 3.8  CL 104 106 104 108 106 104 105  CO2 27 27 27 27 26 29 27   GLUCOSE 148* 147*  171* 157* 143* 207* 181*  BUN 14 14 16 19 16 15 19   CREATININE 1.05* 1.07* 1.01* 1.06* 0.97 0.86 0.86  CALCIUM 8.1* 8.1* 8.1* 7.4* 7.6* 7.6* 7.8*  MG  --  1.9  --   --   --  1.9  --   PHOS 2.0*  --   --   --   --   --   --    GFR: Estimated Creatinine Clearance: 48.4 mL/min (by C-G formula based on SCr of 0.86 mg/dL). Liver Function Tests: Recent Labs  Lab 09/13/17 0309 09/16/17 0454 09/17/17 0500 09/18/17 0527  AST 32 40 73* 61*  ALT 20 26 46 47  ALKPHOS 52 48 57 59  BILITOT 0.7 0.9 1.5* 1.3*  PROT 6.6 6.0* 6.0* 6.4*  ALBUMIN 2.5* 2.0* 2.2* 2.2*   No results for input(s): LIPASE, AMYLASE in the last 168 hours. No results for input(s): AMMONIA in the last 168 hours. Coagulation Profile: Recent Labs  Lab 09/13/17 0309   INR 1.30   Cardiac Enzymes: No results for input(s): CKTOTAL, CKMB, CKMBINDEX, TROPONINI in the last 168 hours. BNP (last 3 results) No results for input(s): PROBNP in the last 8760 hours. HbA1C: No results for input(s): HGBA1C in the last 72 hours. CBG: No results for input(s): GLUCAP in the last 168 hours. Lipid Profile: No results for input(s): CHOL, HDL, LDLCALC, TRIG, CHOLHDL, LDLDIRECT in the last 72 hours. Thyroid Function Tests: No results for input(s): TSH, T4TOTAL, FREET4, T3FREE, THYROIDAB in the last 72 hours. Anemia Panel: No results for input(s): VITAMINB12, FOLATE, FERRITIN, TIBC, IRON, RETICCTPCT in the last 72 hours. Urine analysis:    Component Value Date/Time   COLORURINE YELLOW 09/06/2017 0024   APPEARANCEUR HAZY (A) 09/06/2017 0024   LABSPEC 1.008 09/06/2017 0024   PHURINE 7.0 09/06/2017 0024   GLUCOSEU NEGATIVE 09/06/2017 0024   HGBUR NEGATIVE 09/06/2017 0024   BILIRUBINUR NEGATIVE 09/06/2017 0024   KETONESUR NEGATIVE 09/06/2017 0024   PROTEINUR NEGATIVE 09/06/2017 0024   UROBILINOGEN 1.0 05/31/2010 2325   NITRITE NEGATIVE 09/06/2017 0024   LEUKOCYTESUR SMALL (A) 09/06/2017 0024   Sepsis Labs: @LABRCNTIP (procalcitonin:4,lacticidven:4)  ) Recent Results (from the past 240 hour(s))  Culture, blood (routine x 2)     Status: None   Collection Time: 09/10/17  8:45 AM  Result Value Ref Range Status   Specimen Description BLOOD LEFT ARM  Final   Special Requests   Final    BOTTLES DRAWN AEROBIC ONLY Blood Culture adequate volume   Culture   Final    NO GROWTH 5 DAYS Performed at [facility], [address], [place], [place] [address]    Report Status 09/15/2017 FINAL  Final  Culture, blood (routine x 2)     Status: None   Collection Time: 09/10/17  8:58 AM  Result Value Ref Range Status   Specimen Description BLOOD LEFT ARM  Final   Special Requests   Final    BOTTLES DRAWN AEROBIC AND ANAEROBIC Blood Culture adequate volume   Culture    Final    NO GROWTH 5 DAYS Performed at [facility], [place] [address] [place], [place] [address]    Report Status 09/15/2017 FINAL  Final  Culture, blood (routine x 2)     Status: None   Collection Time: 09/12/17 12:50 PM  Result Value Ref Range Status   Specimen Description   Final    BLOOD RIGHT ANTECUBITAL Performed at [facility]  [address] [place], [place] [address]    Special Requests   Final    BOTTLES DRAWN AEROBIC ONLY Blood Culture adequate volume Performed at [facility] [address], [place], [place] [address]    Culture   Final    NO GROWTH 5 DAYS Performed at [facility], [place] [address] [place], [place] [address]    Report Status 09/17/2017 FINAL  Final  Culture, blood (routine x 2)     Status: None   Collection Time: 09/12/17 12:50 PM  Result Value Ref Range Status   Specimen Description   Final    BLOOD RIGHT HAND Performed at [facility] [address] [place], [place] [address]    Special Requests   Final    BOTTLES DRAWN AEROBIC AND ANAEROBIC Blood Culture adequate volume Performed at [facility] [address] [place], [place] [address]    Culture   Final    NO GROWTH 5 DAYS Performed at [facility], [place] [address] [place], [place] [address]    Report Status 09/17/2017 FINAL  Final  MRSA PCR Screening     Status: None   Collection Time: 09/12/17  3:38 PM  Result Value Ref Range Status   MRSA by PCR NEGATIVE NEGATIVE Final    Comment:        The GeneXpert MRSA Assay (FDA approved for NASAL specimens only), is one component of a comprehensive MRSA colonization surveillance program. It is not intended to diagnose MRSA infection nor to guide or monitor treatment for MRSA infections. Performed at [facility], [place] [address], [place], [place] [address]       Studies: No results found.  Scheduled Meds: .  antiseptic oral rinse  15 mL Mouth Rinse Q4H  . Chlorhexidine Gluconate Cloth  6 each Topical Daily  . collagenase   Topical Daily  . decitabine (DACOGEN) CHEMO IV infusion  20 mg/m2 (Treatment Plan Recorded) Intravenous Once  . diltiazem  60 mg Oral Q6H  . feeding supplement (ENSURE ENLIVE)  237 mL Oral BID BM  . ketorolac  1 drop Both Eyes QID  . mouth rinse  15 mL Mouth Rinse BID  . multivitamin with minerals  1 tablet Oral Daily  . nutrition supplement (JUVEN)  1 packet Oral BID BM  . polyethylene glycol  17 g Oral BID  . prednisoLONE acetate  1 drop Both Eyes QID  . prochlorperazine  10 mg Oral Once  . sodium chloride flush  10-40 mL Intracatheter Q12H    Continuous Infusions: . sodium chloride    . sodium chloride    . sodium chloride    . sodium chloride    . meropenem (MERREM) IV Stopped (09/18/17 1029)     LOS: 12 days     Kayleen Memos, MD [facility] Pager [phone]  If 7PM-7AM, please contact night-coverage [email or web address] Password [facility] 09/18/2017, 11:25 AM

## 2017-09-18 NOTE — Progress Notes (Signed)
Progress Note   Subjective   Doing well today, the patient denies CP or SOB. afib heart rates are better conrolled No new concerns  Inpatient Medications    Scheduled Meds: . antiseptic oral rinse  15 mL Mouth Rinse Q4H  . Chlorhexidine Gluconate Cloth  6 each Topical Daily  . collagenase   Topical Daily  . diltiazem  60 mg Oral Q6H  . feeding supplement (ENSURE ENLIVE)  237 mL Oral BID BM  . ketorolac  1 drop Both Eyes QID  . mouth rinse  15 mL Mouth Rinse BID  . multivitamin with minerals  1 tablet Oral Daily  . nutrition supplement (JUVEN)  1 packet Oral BID BM  . polyethylene glycol  17 g Oral BID  . prednisoLONE acetate  1 drop Both Eyes QID  . sodium chloride flush  10-40 mL Intracatheter Q12H   Continuous Infusions: . sodium chloride    . sodium chloride    . meropenem (MERREM) IV Stopped (09/17/17 2230)   PRN Meds: acetaminophen **OR** acetaminophen, alteplase, alteplase, heparin lock flush, heparin lock flush, heparin lock flush, heparin lock flush, HYDROcodone-acetaminophen, labetalol, ondansetron (ZOFRAN) IV, sodium chloride flush, sodium chloride flush, sodium chloride flush, sodium chloride flush, sodium chloride flush, sodium chloride flush, sodium chloride flush   Vital Signs    Vitals:   09/17/17 2000 09/17/17 2007 09/18/17 0004 09/18/17 0400  BP: (!) 115/42  (!) 152/72   Pulse: 73     Resp: (!) 40     Temp:  100.3 F (37.9 C) 99.7 F (37.6 C) 98.2 F (36.8 C)  TempSrc:  Axillary Oral Oral  SpO2: 93%     Weight:      Height:        Intake/Output Summary (Last 24 hours) at 09/18/2017 0730 Last data filed at 09/18/2017 0400 Gross per 24 hour  Intake 1715.42 ml  Output 1400 ml  Net 315.42 ml   Filed Weights   09/26/2017 1416 09/13/17 1100  Weight: 152 lb (68.9 kg) 148 lb 13 oz (67.5 kg)    Telemetry    Afib, V rates now 80s - Personally Reviewed  Physical Exam   GEN- The patient is elderly and frail appearing, sleeping but rouses Head-  normocephalic, atraumatic Eyes-  Sclera clear, conjunctiva pink Ears- hearing intact Oropharynx- clear Neck- supple, Lungs- Clear to ausculation bilaterally, normal work of breathing Heart- irregular rate and rhythm  GI- soft, NT, ND, + BS Extremities- no clubbing, cyanosis, or edema    Labs    Chemistry Recent Labs  Lab 09/13/17 0309  09/15/17 0446 09/16/17 0454 09/17/17 0500  NA 141   < > 142 140 140  K 3.6   < > 3.3* 2.9* 3.1*  CL 106   < > 108 106 104  CO2 27   < > 27 26 29   GLUCOSE 147*   < > 157* 143* 207*  BUN 14   < > 19 16 15   CREATININE 1.07*   < > 1.06* 0.97 0.86  CALCIUM 8.1*   < > 7.4* 7.6* 7.6*  PROT 6.6  --   --  6.0* 6.0*  ALBUMIN 2.5*  --   --  2.0* 2.2*  AST 32  --   --  40 73*  ALT 20  --   --  26 46  ALKPHOS 52  --   --  48 57  BILITOT 0.7  --   --  0.9 1.5*  GFRNONAA 47*   < >  48* 53* >60  GFRAA 55*   < > 56* >60 >60  ANIONGAP 8   < > 7 8 7    < > = values in this interval not displayed.     Hematology Recent Labs  Lab 09/16/17 0454 09/16/17 1355 09/17/17 0500  WBC 9.0 9.5 14.9*  RBC 3.15* 3.09* 2.83*  HGB 9.5* 9.5* 8.8*  HCT 29.6* 29.1* 26.6*  MCV 94.0 94.2 94.0  MCH 30.2 30.7 31.1  MCHC 32.1 32.6 33.1  RDW 19.2* 19.2* 19.4*  PLT 18* 68* 42*    Cardiac EnzymesNo results for input(s): TROPONINI in the last 168 hours. No results for input(s): TROPIPOC in the last 168 hours.      Assessment & Plan    1.  New onset afib Occurs in the setting of acute medical illness Not currently a candidate for anticoagulation Rate controlled with oral diltiazem Would plan rate control strategy until her anemia/ thrombocytopenia are resolved.  Once able to take long term anticoagulation again, we can consider pursuit of sinus rhythm.  2. HTN Stable No change required today  Ok to transfer to telemetry floor once other issues are stabilized.  Thompson Grayer MD, [facility] 09/18/2017 7:30 AM

## 2017-09-18 NOTE — Progress Notes (Signed)
Patient tolerated chemotherapy well.  

## 2017-09-18 NOTE — Progress Notes (Signed)
Chemotherapy dosage and calculations checked and verified with Diana Torres RN. 

## 2017-09-18 NOTE — Progress Notes (Signed)
Palliative:  I called and spoke with daughter, Charolotte Eke, who will speak with her family to arrange a meeting with palliative tomorrow 09/19/17 afternoon. Will await confirmation of time for tomorrow afternoon. Thank you for this consult.   No charge  [name], NP Palliative Medicine Team Pager # [phone] (M-F 8a-5p) Team Phone # [phone] (Nights/Weekends)

## 2017-09-19 ENCOUNTER — Telehealth: Payer: Self-pay | Admitting: Pharmacist

## 2017-09-19 ENCOUNTER — Inpatient Hospital Stay (HOSPITAL_COMMUNITY): Payer: Medicare Other

## 2017-09-19 DIAGNOSIS — I4891 Unspecified atrial fibrillation: Secondary | ICD-10-CM

## 2017-09-19 DIAGNOSIS — R509 Fever, unspecified: Secondary | ICD-10-CM

## 2017-09-19 DIAGNOSIS — D61818 Other pancytopenia: Secondary | ICD-10-CM

## 2017-09-19 LAB — CBC
HCT: 24.9 % — ABNORMAL LOW (ref 36.0–46.0)
Hemoglobin: 8.1 g/dL — ABNORMAL LOW (ref 12.0–15.0)
MCH: 30.5 pg (ref 26.0–34.0)
MCHC: 32.5 g/dL (ref 30.0–36.0)
MCV: 93.6 fL (ref 78.0–100.0)
PLATELETS: 71 10*3/uL — AB (ref 150–400)
RBC: 2.66 MIL/uL — ABNORMAL LOW (ref 3.87–5.11)
RDW: 20.1 % — AB (ref 11.5–15.5)
WBC: 27.3 10*3/uL — ABNORMAL HIGH (ref 4.0–10.5)

## 2017-09-19 LAB — PREPARE PLATELET PHERESIS
Unit division: 0
Unit division: 0

## 2017-09-19 LAB — BPAM PLATELET PHERESIS
Blood Product Expiration Date: 201906152359
Blood Product Expiration Date: 201906172359
ISSUE DATE / TIME: 201906140931
ISSUE DATE / TIME: 201906161456
Unit Type and Rh: 600
Unit Type and Rh: 5100

## 2017-09-19 LAB — LACTIC ACID, PLASMA: LACTIC ACID, VENOUS: 1.5 mmol/L (ref 0.5–1.9)

## 2017-09-19 LAB — OCCULT BLOOD X 1 CARD TO LAB, STOOL: FECAL OCCULT BLD: NEGATIVE

## 2017-09-19 LAB — LACTATE DEHYDROGENASE: LDH: 656 U/L — AB (ref 98–192)

## 2017-09-19 MED ORDER — HEPARIN SOD (PORK) LOCK FLUSH 100 UNIT/ML IV SOLN: 500.0000 [IU] | Freq: Once | INTRAVENOUS | Status: DC | PRN

## 2017-09-19 MED ORDER — ALTEPLASE 2 MG IJ SOLR
2.0000 mg | Freq: Once | INTRAMUSCULAR | Status: DC | PRN
  Filled 2017-09-19: qty 2

## 2017-09-19 MED ORDER — SODIUM CHLORIDE 0.9 % IV SOLN: Freq: Once | INTRAVENOUS | Status: DC

## 2017-09-19 MED ORDER — LIP MEDEX EX OINT
TOPICAL_OINTMENT | CUTANEOUS | Status: AC
  Administered 2017-09-19: 1
  Filled 2017-09-19: qty 7

## 2017-09-19 MED ORDER — SODIUM CHLORIDE 0.9 % IV SOLN
20.0000 mg/m2 | Freq: Once | INTRAVENOUS | Status: AC
  Administered 2017-09-19: 35 mg via INTRAVENOUS
  Filled 2017-09-19: qty 7

## 2017-09-19 MED ORDER — SODIUM CHLORIDE 0.9% FLUSH: 3.0000 mL | INTRAVENOUS | Status: DC | PRN

## 2017-09-19 MED ORDER — HEPARIN SOD (PORK) LOCK FLUSH 100 UNIT/ML IV SOLN: 250.0000 [IU] | Freq: Once | INTRAVENOUS | Status: DC | PRN

## 2017-09-19 MED ORDER — DILTIAZEM HCL ER 90 MG PO CP12
180.0000 mg | ORAL_CAPSULE | Freq: Two times a day (BID) | ORAL | Status: DC
  Administered 2017-09-19 – 2017-09-20 (×2): 180 mg via ORAL
  Filled 2017-09-19 (×3): qty 2

## 2017-09-19 MED ORDER — VENETOCLAX 100 MG PO TABS: 400.0000 mg | ORAL_TABLET | Freq: Every day | ORAL | 2 refills | Status: AC

## 2017-09-19 MED ORDER — PROCHLORPERAZINE MALEATE 10 MG PO TABS
10.0000 mg | ORAL_TABLET | Freq: Once | ORAL | Status: AC
  Administered 2017-09-19: 10 mg via ORAL
  Filled 2017-09-19: qty 1

## 2017-09-19 MED ORDER — SODIUM CHLORIDE 0.9% FLUSH: 10.0000 mL | INTRAVENOUS | Status: DC | PRN

## 2017-09-19 NOTE — Progress Notes (Signed)
Chemotherapy dosage and calculations checked and verified with Nancy Marus, RN.

## 2017-09-19 NOTE — Evaluation (Signed)
Occupational Therapy Evaluation Patient Details Name: Leah Lewis MRN: [id number] DOB: 21-Apr-1934 Today's Date: 09/19/2017    History of Present Illness 83 y.o. female with medical history significant of hypertension, prior stroke hx of left DVT.  Patient presented to the emergency department complaining of generalized weakness and fatigue. Dx of fever,anemia.  New diagnosis of AML-oncology following   Clinical Impression   The patient is noted to have a significant decline in function over last week. Today, patient does not follow  Commands, not engaged in activity, requires 2 total assist for mobility/ADL activity   Noted  Palliative  Meeting is pending.     Follow Up Recommendations  SNF    Equipment Recommendations  None recommended by OT    Recommendations for Other Services       Precautions / Restrictions Precautions Precautions: Other (comment);Fall      Mobility Bed Mobility Overal bed mobility: Needs Assistance Bed Mobility: Supine to Sit;Sit to Supine     Supine to sit: Total assist;+2 for physical assistance Sit to supine: Total assist;+2 for physical assistance   General bed mobility comments: the patient is not really following  commands to assist in mobility  Transfers                 General transfer comment: unable        ADL either performed or assessed with clinical judgement   ADL Overall ADL's : Needs assistance/impaired                                       General ADL Comments: pt is currently dependent     Vision Baseline Vision/History: Wears glasses              Pertinent Vitals/Pain Pain Assessment: Faces Faces Pain Scale: Hurts even more Pain Location: moving R shoulder Pain Descriptors / Indicators: Discomfort;Grimacing;Guarding Pain Intervention(s): Monitored during session        Extremity/Trunk Assessment         Cervical / Trunk Assessment Cervical / Trunk Assessment: Kyphotic    Communication Communication Communication: No difficulties   Cognition Arousal/Alertness: Lethargic   Overall Cognitive Status: Impaired/Different from baseline Area of Impairment: Following commands                               General Comments: the patient is not following commands, she is not engaging in the  treatment.    General Comments               Home Living Family/patient expects to be discharged to:: Private residence Living Arrangements: Children Available Help at Discharge: Family;Available 24 hours/day   Home Access: Ramped entrance                     Home Equipment: Clinical cytogeneticist - 4 wheels   Additional Comments: lives with daughter, other daughter gets pt's groceries      Prior Functioning/Environment Level of Independence: Independent with assistive device(s)        Comments: walks with rollator        OT Problem List: Decreased strength;Decreased activity tolerance;Impaired balance (sitting and/or standing);Decreased safety awareness;Decreased knowledge of use of DME or AE;Impaired vision/perception      OT Treatment/Interventions: Self-care/ADL training    OT Goals(Current goals can be found in the care plan section)  Acute Rehab OT Goals Patient Stated Goal: did not state OT Goal Formulation: With patient Time For Goal Achievement: 09/26/17  OT Frequency: Min 2X/week   Barriers to D/C:               AM-PAC PT "6 Clicks" Daily Activity     Outcome Measure Help from another person eating meals?: Total Help from another person taking care of personal grooming?: Total Help from another person toileting, which includes using toliet, bedpan, or urinal?: Total Help from another person bathing (including washing, rinsing, drying)?: Total Help from another person to put on and taking off regular upper body clothing?: Total Help from another person to put on and taking off regular lower body clothing?: Total 6  Click Score: 6   End of Session Nurse Communication: Mobility status  Activity Tolerance: Patient limited by lethargy Patient left: in bed;with call bell/phone within reach;with bed alarm set  OT Visit Diagnosis: Unsteadiness on feet (R26.81);Muscle weakness (generalized) (M62.81);Other abnormalities of gait and mobility (R26.89)                Time: 1109-1130 OT Time Calculation (min): 21 min Charges:  OT General Charges $OT Visit: 1 Visit OT Evaluation $OT Eval Moderate Complexity: 1 Mod G-Codes:     Leah Lewis, Leah Lewis  Leah Lewis 09/19/2017, 1:42 PM

## 2017-09-19 NOTE — Progress Notes (Signed)
Nutrition Follow-up  DOCUMENTATION CODES:   Not applicable  INTERVENTION:  - Continue Ensure Enlive BID and Juven BID.  - Continue to encourage PO intakes. - Will monitor for POC/GOC following family meeting today.   NUTRITION DIAGNOSIS:   Increased nutrient needs related to wound healing as evidenced by estimated needs. -ongoing  GOAL:   Patient will meet greater than or equal to 90% of their needs -likely unmet on average.   MONITOR:   PO intake, Supplement acceptance, Labs, Weight trends, Skin, I & O's  ASSESSMENT:   83 y.o. female with medical history significant of hypertension, prior stroke hx of left DVT.  Patient presented to the emergency department complaining of generalized weakness and fatigue.   Weight +6 lbs/2.5 kg today compared to 6/11 weight. Estimated nutrition needs updated at this time (using weight from 6/11: 67.5 kg) d/t patient starting chemo and worsening in coccyx wound, per documentation--previously documented as stage 2 now as unstageable, full thickness.   Per chart review, she is accepting both oral nutrition supplements ~75% of time time offered, but unsure how much of these supplements are then being consumed. Per flow sheet, she consumed 25% of breakfast and lunch on 6/14 (263 kcal, 7 grams of protein); 30% of breakfast and 50% of lunch on 6/15 (534 kcal, 16 grams of protein); 10% of breakfast and 25% of lunch on 6/16 (209 kcal, 8 grams of protein).   Per Dr. Juel Burrow note yesterday AM: persistent weakness with PT recommending Home Health PT and 24 hour assistance, worsening thrombocytopenia likely 2/2 AML, first chemo for AML was on 6/13, new dx of afib with RVR, improving hypertension, resolved hypokalemia, worsening leukocytosis, acute pulmonary edema, suspected hemolysis, persistent fever likely d/t AML, osteoporosis.  Palliative Care is following and last note written yesterday afternoon stating plan for family meeting this afternoon.    Medications reviewed; daily multivitamin with minerals, 1 packet Miralax BID. Labs reviewed; Ca: 7.8 mg/dL.  IVF: NS @ 20 mL/hr.    Diet Order:   Diet Order           Diet regular Room service appropriate? Yes; Fluid consistency: Thin  Diet effective now          EDUCATION NEEDS:   Not appropriate for education at this time  Skin:  Skin Assessment: Skin Integrity Issues: Skin Integrity Issues:: Unstageable Stage II: coccyx Unstageable: full thickness to coccyx  Last BM:  6/17  Height:   Ht Readings from Last 1 Encounters:  09/06/17 5\' 4"  (1.626 m)    Weight:   Wt Readings from Last 1 Encounters:  09/19/17 154 lb 5.2 oz (70 kg)    Ideal Body Weight:  54.5 kg  BMI:  Body mass index is 26.49 kg/m.  Estimated Nutritional Needs:   Kcal:  2025-2160 (30-32 kcal/kg)  Protein:  108-120 grams (1.6-1.8 grams/kg)  Fluid:  1.7L/day     Jarome Matin, MS, RD, LDN, CNSC Inpatient Clinical Dietitian Pager # [phone] After hours/weekend pager # [phone]

## 2017-09-19 NOTE — Progress Notes (Addendum)
Palliative:  I met today with Leah Lewis who is aware of who she is, that she is at "[employer]," and that she is sick. She does not give any more details. Unclear how much she understands.   I spoke more with her daughters, Leah Lewis and Leah Lewis, Leah Lewis, and also with son Leah Lewis and grandson Leah Lewis via telephone. They have many questions regarding the treatment choices and options for Leah Lewis. They are very concerned about knowing if the chemotherapy is helping. I told them that it may be too soon to know for sure but shared concerns with her overall condition as well as increasing WBC, fevers, and low platelets. They have many questions that would be best answered by Dr. Marin Olp.   We discussed their concern for nutrition and activity. They brought up the idea of IV nutrition which I shared would not be an option but artificial feeding can be done via tubes in nose or abdomen but this doesn't mean that this should be done. We did discuss overall QOL and I encouraged them to talk and consider their desired aggressiveness of care given her declining functional status and QOL. We discussed code status, artificial feeding, and even comfort care. They seem to understand the importance of having this conversations and Leah Lewis is motivated for family to discuss and have a decision for code status over the next 1-2 days.   We discussed decision making and involving Leah Lewis in these conversations if she is able and willing. They share that she has historically left decisions for healthcare up to them. We discussed legal surrogate decision makers as majority of her reasonably available adult children with lack of documented HCPOA if she is not decisional. She has 5 children in total but only 2 locally. Leah Lewis's 2 sons are very involved in her care as well Leah Lewis and Leah Lewis). I provided them with Hard Choices booklet and will continue conversation. Will ask Dr. Marin Olp to speak with them as well.  Emotional support provided.   Family would like for doctors to call and speak with Leah Lewis [phone] (6 hrs behind in [place]) or if before 1000 am call Leah Lewis [phone].   Exam: Alert and oriented x 2, no distress  [id number]  [name], NP Palliative Medicine Team Pager # [phone] (M-F 8a-5p) Team Phone # [phone] (Nights/Weekends)

## 2017-09-19 NOTE — Progress Notes (Addendum)
Progress Note  Patient Name: Leah Lewis Date of Encounter: 09/19/2017  Primary Cardiologist: Dr. Jeneen Rinks Allred-New   Subjective   Pt not feeling well this AM>>temp now 102.9 this morning. She remains in AF with rates in the 95-115 range. Denies chest pain.   Inpatient Medications    Scheduled Meds: . antiseptic oral rinse  15 mL Mouth Rinse Q4H  . Chlorhexidine Gluconate Cloth  6 each Topical Daily  . collagenase   Topical Daily  . diltiazem  60 mg Oral Q6H  . feeding supplement (ENSURE ENLIVE)  237 mL Oral BID BM  . ketorolac  1 drop Both Eyes QID  . mouth rinse  15 mL Mouth Rinse BID  . multivitamin with minerals  1 tablet Oral Daily  . nutrition supplement (JUVEN)  1 packet Oral BID BM  . polyethylene glycol  17 g Oral BID  . prednisoLONE acetate  1 drop Both Eyes QID  . sodium chloride flush  10-40 mL Intracatheter Q12H   Continuous Infusions: . sodium chloride    . sodium chloride    . sodium chloride    . meropenem (MERREM) IV Stopped (09/18/17 2317)   PRN Meds: acetaminophen **OR** acetaminophen, alteplase, heparin lock flush, heparin lock flush, heparin lock flush, heparin lock flush, heparin lock flush, heparin lock flush, HYDROcodone-acetaminophen, labetalol, ondansetron (ZOFRAN) IV, sodium chloride flush, sodium chloride flush   Vital Signs    Vitals:   09/19/17 0012 09/19/17 0351 09/19/17 0500 09/19/17 0603  BP: (!) 136/59   (!) 138/54  Pulse:      Resp:      Temp:  99.2 F (37.3 C)    TempSrc:  Oral    SpO2:      Weight:   154 lb 5.2 oz (70 kg)   Height:        Intake/Output Summary (Last 24 hours) at 09/19/2017 0737 Last data filed at 09/19/2017 0500 Gross per 24 hour  Intake 1099 ml  Output 2000 ml  Net -901 ml   Filed Weights   09/13/17 1100 09/18/17 1200 09/19/17 0500  Weight: 148 lb 13 oz (67.5 kg) 151 lb 0.2 oz (68.5 kg) 154 lb 5.2 oz (70 kg)   Physical Exam   General: Elderly, frail, NAD Skin: Warm, dry, intact  Head:  Normocephalic, atraumatic, clear, moist mucus membranes. Neck: Negative for carotid bruits. No JVD Lungs:Clear to ausculation bilaterally. No wheezes, rales, or rhonchi. Breathing is unlabored. Cardiovascular: Irregularly irregular with S1 S2. No murmurs, rubs or gallops Abdomen: Soft, non-tender, non-distended with normoactive bowel sounds.  No obvious abdominal masses. MSK: Strength and tone appear decreased for age. 4/4 in all extremities Extremities: No edema. No clubbing or cyanosis. DP/PT pulses 1+ bilaterally Neuro: Alert and oriented to person and place. No focal deficits. No facial asymmetry. MAE spontaneously. Psych: Responds to questions somewhat appropriately with normal affect.    Labs    Chemistry Recent Labs  Lab 09/16/17 0454 09/17/17 0500 09/18/17 0527  NA 140 140 142  K 2.9* 3.1* 3.8  CL 106 104 105  CO2 26 29 27   GLUCOSE 143* 207* 181*  BUN 16 15 19   CREATININE 0.97 0.86 0.86  CALCIUM 7.6* 7.6* 7.8*  PROT 6.0* 6.0* 6.4*  ALBUMIN 2.0* 2.2* 2.2*  AST 40 73* 61*  ALT 26 46 47  ALKPHOS 48 57 59  BILITOT 0.9 1.5* 1.3*  GFRNONAA 53* >60 >60  GFRAA >60 >60 >60  ANIONGAP 8 7 10  Hematology Recent Labs  Lab 09/17/17 0500 09/18/17 0527 09/19/17 0528  WBC 14.9* 18.4* 27.3*  RBC 2.83* 3.05* 2.66*  HGB 8.8* 9.1* 8.1*  HCT 26.6* 28.4* 24.9*  MCV 94.0 93.1 93.6  MCH 31.1 29.8 30.5  MCHC 33.1 32.0 32.5  RDW 19.4* 20.0* 20.1*  PLT 42* 23* 71*   Cardiac EnzymesNo results for input(s): TROPONINI in the last 168 hours. No results for input(s): TROPIPOC in the last 168 hours.   BNPNo results for input(s): BNP, PROBNP in the last 168 hours.   DDimer No results for input(s): DDIMER in the last 168 hours.   Radiology    Dg Chest Port 1 View  Result Date: 09/17/2017 CLINICAL DATA:  Fever EXAM: PORTABLE CHEST 1 VIEW COMPARISON:  09/12/2017 FINDINGS: There is cardiomegaly. Vascular congestion. Bilateral patchy airspace opacities, left greater than right. This  could reflect asymmetric edema or infection. No effusions. No acute bony abnormality. Right PICC line is in place with the tip at the cavoatrial junction. IMPRESSION: Patchy bilateral airspace disease, left greater than right could reflect asymmetric edema or infection. Electronically Signed   By: Rolm Baptise M.D.   On: 09/17/2017 09:38   Telemetry    09/19/17 Atrial fibrillation HR 116 - Personally Reviewed  ECG    09/15/17: Atrial fibrillation HR 105- Personally Reviewed  Cardiac Studies   Echocardiogram 09/07/17: Study Conclusions  - Left ventricle: The cavity size was normal. Wall thickness was   normal. Systolic function was normal. The estimated ejection   fraction was in the range of 60% to 65%. Wall motion was normal;   there were no regional wall motion abnormalities. Doppler   parameters are consistent with abnormal left ventricular   relaxation (grade 1 diastolic dysfunction). The E/e&' ratio is <8,   suggesting normal LV filling pressure. - Aortic valve: Trileaflet. Sclerosis without stenosis.   Transvalvular velocity was minimally increased. There was mild   regurgitation. - Aorta: Ascending aortic diameter: 39 mm (S). - Ascending aorta: The ascending aorta was mildly dilated. - Mitral valve: Mildly thickened leaflets . There was trivial   regurgitation. - Left atrium: The atrium was normal in size. - Right atrium: Mildly dilated. - Tricuspid valve: There was mild regurgitation. - Pulmonary arteries: PA peak pressure: 32 mm Hg (S). - Inferior vena cava: The vessel was normal in size. The   respirophasic diameter changes were in the normal range (>= 50%),   consistent with normal central venous pressure.  Impressions:  - LVEF 60-65%, normal wall thickness, normal wall motion, grade 1   DD, normal LV filling pressure, aortic valve sclerosis with mild   AI, dilated ascending aorta to 3.9 cm, trivial MR, normal LA   size, mild RAE, mild TR, RVSP 32 mmHg, normal  IVC.   Patient Profile     83 y.o. female with a hx of HTN, prior stroke, hx of DVT and current AML who is being followed by Cardiology for new onset atrial fibrillation in the setting of acute illness.   Assessment & Plan    1. New onset atrial fibrillation: -Remains in atrial fibrillation, HR's 95-115 range  -Not currently a candidate for anticoagulation despite CHA2DS2VASc score of 6 secondary to thrombocytopenia in the setting of new onset AML (per primary) -Transitioned to oral diltiazem 60mg  Q6H>>>cosider increasing to 60mg  Q6H if HR remains in the 100's. BP's stable at 138/54>136/59>136/59 -Plan is for rate control until anemia/thrombocytopemia are resolved>>>then could consider anticoagulation>>NSR   -Asymptomatic, denies palpitations  2.  HTN: -Stable, 138/54>136/59>136/59 -Not currently on antihypertensive regimen  -Will continue to follow   3. 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  The patient was seen, examined and discussed with Kathyrn Drown, NP  and I agree with the above.   83 year old female with h/o AML, thrombocytopenia, anemia, new onset a-fib with RVR, today febrile, I would increase Cardizem to SR 180 mg PO BID. No anticoagulation given low platelets.  Ena Dawley, MD 09/19/2017

## 2017-09-19 NOTE — Progress Notes (Signed)
Physical Therapy Treatment Patient Details Name: Leah Lewis MRN: [id number] DOB: Apr 03, 1935 Today's Date: 09/19/2017    History of Present Illness 83 y.o. female with medical history significant of hypertension, prior stroke hx of left DVT.  Patient presented to the emergency department complaining of generalized weakness and fatigue. Dx of fever,anemia.  New diagnosis of AML-oncology following    PT Comments    The patient is noted to have a significant decline in function over last week. Today, patient does not follow  Commands, not engaged in activity, requires 2 total assist for mobility.   Noted  Palliative  Meeting is pending.    Follow Up Recommendations  (unsure since decline)     Equipment Recommendations  None recommended by PT    Recommendations for Other Services       Precautions / Restrictions Precautions Precautions: Other (comment);Fall    Mobility  Bed Mobility Overal bed mobility: Needs Assistance Bed Mobility: Supine to Sit;Sit to Supine     Supine to sit: Total assist;+2 for physical assistance Sit to supine: Total assist;+2 for physical assistance   General bed mobility comments: the patient is not really following  commands to assist in mobility  Transfers                 General transfer comment: unable  Ambulation/Gait                 Stairs             Wheelchair Mobility    Modified Rankin (Stroke Patients Only)       Balance                                            Cognition Arousal/Alertness: Lethargic   Overall Cognitive Status: Impaired/Different from baseline Area of Impairment: Following commands                               General Comments: the patient is not following commands, she is not engaging in the  treatment.       Exercises      General Comments        Pertinent Vitals/Pain Pain Assessment: Faces Faces Pain Scale: Hurts even more Pain  Location: moving R shoulder Pain Descriptors / Indicators: Discomfort;Grimacing;Guarding Pain Intervention(s): Monitored during session    Home Living                      Prior Function            PT Goals (current goals can now be found in the care plan section) Progress towards PT goals: Not progressing toward goals - comment(appears to have declined)    Frequency    Min 2X/week      PT Plan Discharge plan needs to be updated;Frequency needs to be updated    Co-evaluation              AM-PAC PT "6 Clicks" Daily Activity  Outcome Measure  Difficulty turning over in bed (including adjusting bedclothes, sheets and blankets)?: Unable Difficulty moving from lying on back to sitting on the side of the bed? : Unable Difficulty sitting down on and standing up from a chair with arms (e.g., wheelchair, bedside commode, etc,.)?: Unable Help needed moving to and from a  bed to chair (including a wheelchair)?: Total Help needed walking in hospital room?: Total Help needed climbing 3-5 steps with a railing? : Total 6 Click Score: 6    End of Session   Activity Tolerance: Patient limited by lethargy;Other (comment)(appears change in condition to worse) Patient left: in bed;with call bell/phone within reach;with bed alarm set Nurse Communication: Mobility status PT Visit Diagnosis: Difficulty in walking, not elsewhere classified (R26.2)     Time: 1109-1130 PT Time Calculation (min) (ACUTE ONLY): 21 min  Charges:  $Therapeutic Activity: 8-22 mins                    G CodesTresa Lewis PT [phone]    Leah Lewis 09/19/2017, 1:31 PM

## 2017-09-19 NOTE — Telephone Encounter (Signed)
Oral Chemotherapy Pharmacist Encounter   Allopurinol was discontinued for Leah Lewis last week because there was concern that her fevers were related to the start of allopurinol. Since stopping the allopurinol, Leah Lewis has continued to have fevers. Reached out to Dr. Marin Olp about resuming allopurinol therapy, he would like to use rasburicase in the case of uric acid elevation.    Darl Pikes, PharmD, BCPS Hematology/Oncology Clinical Pharmacist [facility] [phone]  09/19/2017 2:06 PM

## 2017-09-19 NOTE — Telephone Encounter (Addendum)
Oral Oncology Pharmacist Encounter  Received new prescription for Venetoclax [facility] [place]) for the treatment of newly diagnosed AML in an elderly patient in conjunction with decitabine, planned duration until disease progression or unacceptable drug toxicity.  CMP/LDH from 09/18/17, CBC from 09/19/17, and uric acid from 09/17/17 assessed, baseline okay will continue to actively monitor for TLS once venetoclax is added. Prescription dose and frequency assessed.   Dosing per Dr. Marin Olp, plan to stretch out taper and administer Venetoclax 100mg  daily for 2 days, 200mg  daily for 2 days, then 400mg  daily. Give with a meal and water at about the same time each day.   Current medication list in Epic reviewed, no DDIs with venetoclax identified.  Prescription has been e-scribed to the [facility].  Darl Pikes, PharmD, BCPS Hematology/Oncology Clinical Pharmacist [facility] [phone]  09/19/2017 10:42 AM

## 2017-09-19 NOTE — Progress Notes (Signed)
PROGRESS NOTE [facility]   Leah Lewis   SEG:[id number] DOB: 1934-06-17  DOA: 09/13/2017 PCP: Wenda Low, MD   Brief Narrative:  Leah Lewis is a 83 year old female with past medical history of osteoporosis, hyperlipidemia, DVT on Xarelto presented to the emergency department complaining of generalized weakness.  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  During hospital stay patient spiking fevers, being treated with antibiotic but obvious source of infection.  Patient developed A. fib with RVR, cardiology consulted and treated with Cardizem.  Thrombocytopenia worsened and she was transfused 1 unit of platelets.  Per oncology poor prognosis and palliative care has been consulted.  Patient has been started on chemotherapy.  Heart rate improving.  Subjective: Patient seen and examined, she has no complaints today.  Denies shortness of breath, palpitations and chest pain.  Continues to spike fever.  Remains in A. fib  Assessment & Plan: AML   Causing generalized weakness and thrombocytopenia, leukocytosis worsened today. Oncology following and started on chemotherapy 6/13. Continue to monitor PT recommending home health PT and 24-hour assistance, fall precautions Encourage oral hydration and nutrition. Palliative care has been consulted, poor prognosis  Thrombocytopenia Secondary to AML Status post 3 units of platelet transfusion during hospital stay No signs of overt bleeding, continue to monitor CBC  New diagnosis A. fib with RVR Cardiology consulted and started on Cardizem drip, she has been transitioned to Cardizem p.o.  Rhythm remains in A. fib with better control.  Continue Cardizem 180 mg twice daily Not on anticoagulation due to thrombocytopenia and anemia.  HTN BP improved Continue Cardizem and labetalol as needed Continue to monitor  Hypokalemia Improved Replete as needed  Persistent  fever No clear source of active infection at this time, patient was treated with IV vancomycin and IV cefepime, subsequently switched to meropenem on 6/14.  There is some infiltrates on chest x-ray from 6/15.  Will check CT of the chest, continue antibiotics for now.  Procalcitonin was elevated.  Fever could be reactive to leukemia.  Patient also have unstageable sacral ulcer, MRI was negative for osteomyelitis.  Continue to monitor  History of DVT Previously on Xarelto, on hold due to thrombocytopenia  Unstageable sacral decubitus ulcer Seems to be stable, patient on antibiotics for possible infectious process however MRI with no signs of osteomyelitis.  Wound care recommendations appreciated  DVT prophylaxis: SCDs Code Status: Full code Family Communication: None at bedside Disposition Plan: To be determined  Consultants:   Oncology  Cardiology  Procedures:   none  Antimicrobials: Anti-infectives (From admission, onward)   Start     Dose/Rate Route Frequency Ordered Stop   09/16/17 1400  meropenem (MERREM) 1 g in sodium chloride 0.9 % 100 mL IVPB     1 g 200 mL/hr over 30 Minutes Intravenous Every 12 hours 09/16/17 1303     09/13/17 1400  vancomycin (VANCOCIN) IVPB 750 mg/150 ml premix  Status:  Discontinued     750 mg 150 mL/hr over 60 Minutes Intravenous Every 24 hours 09/12/17 1302 09/16/17 1620   09/12/17 1400  vancomycin (VANCOCIN) 1,500 mg in sodium chloride 0.9 % 500 mL IVPB     1,500 mg 250 mL/hr over 120 Minutes Intravenous  Once 09/12/17 1302 09/12/17 1600   09/11/17 1800  ceFEPIme (MAXIPIME) 2 g in sodium chloride 0.9 % 100 mL IVPB  Status:  Discontinued     2 g 200 mL/hr over 30 Minutes Intravenous  Every 12 hours 09/11/17 1208 09/16/17 1143   09/10/17 1400  ceFEPIme (MAXIPIME) 2 g in sodium chloride 0.9 % 100 mL IVPB  Status:  Discontinued     2 g 200 mL/hr over 30 Minutes Intravenous Every 8 hours 09/10/17 1253 09/11/17 1208        Objective: Vitals:    09/19/17 1000 09/19/17 1100 09/19/17 1115 09/19/17 1135  BP: (!) 127/53  (!) 116/48 (!) 116/48  Pulse: (!) 103 (!) 101 (!) 103   Resp: (!) 35 (!) 27 (!) 29   Temp:   99.1 F (37.3 C)   TempSrc:   Oral   SpO2: 94% 97% 95%   Weight:      Height:        Intake/Output Summary (Last 24 hours) at 09/19/2017 1152 Last data filed at 09/19/2017 1000 Gross per 24 hour  Intake 1319 ml  Output 1500 ml  Net -181 ml   Filed Weights   09/13/17 1100 09/18/17 1200 09/19/17 0500  Weight: 67.5 kg (148 lb 13 oz) 68.5 kg (151 lb 0.2 oz) 70 kg (154 lb 5.2 oz)    Examination:  General exam: NAD, ill looking  HEENT: OP moist and clear Respiratory system: Decrease BS b/l. Rapid breathing. No wheezes,crackle or rhonchi Cardiovascular system: S1 & S2 heard, Irr Irr. No JVD, murmurs, rubs or gallops Gastrointestinal system: Abdomen is nondistended, soft and nontender. No organomegaly or masses felt. Normal bowel sounds heard. Central nervous system: Alert and oriented. No focal neurological deficits. Extremities: No pedal edema. Symmetric, strength 5/5   Skin: No rashes, lesions or ulcers Psychiatry: Judgement and insight appear normal. Mood & affect appropriate.   Data Reviewed: I have personally reviewed following labs and imaging studies  CBC: Recent Labs  Lab 09/12/17 2150 09/13/17 0309  09/16/17 0454 09/16/17 1355 09/17/17 0500 09/18/17 0527 09/19/17 0528  WBC 3.9* 6.5   < > 9.0 9.5 14.9* 18.4* 27.3*  NEUTROABS 0.8 0.9  --   --  2.3  --   --   --   HGB 10.3* 10.4*   < > 9.5* 9.5* 8.8* 9.1* 8.1*  HCT 32.1* 32.8*   < > 29.6* 29.1* 26.6* 28.4* 24.9*  MCV 95.3 95.6   < > 94.0 94.2 94.0 93.1 93.6  PLT 114* 111*   < > 18* 68* 42* 23* 71*   < > = values in this interval not displayed.   Basic Metabolic Panel: Recent Labs  Lab 09/13/17 0309 09/14/17 0500 09/15/17 0446 09/16/17 0454 09/17/17 0500 09/18/17 0527  NA 141 141 142 140 140 142  K 3.6 3.5 3.3* 2.9* 3.1* 3.8  CL 106 104 108  106 104 105  CO2 27 27 27 26 29 27   GLUCOSE 147* 171* 157* 143* 207* 181*  BUN 14 16 19 16 15 19   CREATININE 1.07* 1.01* 1.06* 0.97 0.86 0.86  CALCIUM 8.1* 8.1* 7.4* 7.6* 7.6* 7.8*  MG 1.9  --   --   --  1.9  --    GFR: Estimated Creatinine Clearance: 49.2 mL/min (by C-G formula based on SCr of 0.86 mg/dL). Liver Function Tests: Recent Labs  Lab 09/13/17 0309 09/16/17 0454 09/17/17 0500 09/18/17 0527  AST 32 40 73* 61*  ALT 20 26 46 47  ALKPHOS 52 48 57 59  BILITOT 0.7 0.9 1.5* 1.3*  PROT 6.6 6.0* 6.0* 6.4*  ALBUMIN 2.5* 2.0* 2.2* 2.2*   No results for input(s): LIPASE, AMYLASE in the last 168 hours. No results  for input(s): AMMONIA in the last 168 hours. Coagulation Profile: Recent Labs  Lab 09/13/17 0309  INR 1.30   Cardiac Enzymes: No results for input(s): CKTOTAL, CKMB, CKMBINDEX, TROPONINI in the last 168 hours. BNP (last 3 results) No results for input(s): PROBNP in the last 8760 hours. HbA1C: No results for input(s): HGBA1C in the last 72 hours. CBG: No results for input(s): GLUCAP in the last 168 hours. Lipid Profile: No results for input(s): CHOL, HDL, LDLCALC, TRIG, CHOLHDL, LDLDIRECT in the last 72 hours. Thyroid Function Tests: No results for input(s): TSH, T4TOTAL, FREET4, T3FREE, THYROIDAB in the last 72 hours. Anemia Panel: No results for input(s): VITAMINB12, FOLATE, FERRITIN, TIBC, IRON, RETICCTPCT in the last 72 hours. Sepsis Labs: Recent Labs  Lab 09/17/17 0740 09/17/17 1000 09/18/17 1220 09/19/17 0850  PROCALCITON 1.60  --   --   --   LATICACIDVEN 2.0* 2.1* 2.7* 1.5    Recent Results (from the past 240 hour(s))  Culture, blood (routine x 2)     Status: None   Collection Time: 09/10/17  8:45 AM  Result Value Ref Range Status   Specimen Description BLOOD LEFT ARM  Final   Special Requests   Final    BOTTLES DRAWN AEROBIC ONLY Blood Culture adequate volume   Culture   Final    NO GROWTH 5 DAYS Performed at [facility],  [address] [place], [place] [address]    Report Status 09/15/2017 FINAL  Final  Culture, blood (routine x 2)     Status: None   Collection Time: 09/10/17  8:58 AM  Result Value Ref Range Status   Specimen Description BLOOD LEFT ARM  Final   Special Requests   Final    BOTTLES DRAWN AEROBIC AND ANAEROBIC Blood Culture adequate volume   Culture   Final    NO GROWTH 5 DAYS Performed at [facility], [place] [address] [place], [place] [address]    Report Status 09/15/2017 FINAL  Final  Culture, blood (routine x 2)     Status: None   Collection Time: 09/12/17 12:50 PM  Result Value Ref Range Status   Specimen Description   Final    BLOOD RIGHT ANTECUBITAL Performed at [place] [address], [place], [place] [address]    Special Requests   Final    BOTTLES DRAWN AEROBIC ONLY Blood Culture adequate volume Performed at [place] [address] [place], [place] [address]    Culture   Final    NO GROWTH 5 DAYS Performed at [facility], [place] [address] [place], [place] [address]    Report Status 09/17/2017 FINAL  Final  Culture, blood (routine x 2)     Status: None   Collection Time: 09/12/17 12:50 PM  Result Value Ref Range Status   Specimen Description   Final    BLOOD RIGHT HAND Performed at [facility] [place] [address], [place], [place] [address]    Special Requests   Final    BOTTLES DRAWN AEROBIC AND ANAEROBIC Blood Culture adequate volume Performed at [facility] [address], [place], [place] [address]    Culture   Final    NO GROWTH 5 DAYS Performed at [facility], [place] [address] [place], [place] [address]    Report Status 09/17/2017 FINAL  Final  MRSA PCR Screening     Status: None   Collection Time: 09/12/17  3:38 PM  Result Value Ref Range Status   MRSA by PCR NEGATIVE NEGATIVE Final    Comment:        The GeneXpert MRSA Assay (FDA approved for NASAL  specimens only), is one component of a comprehensive MRSA colonization surveillance program. It is not intended to diagnose MRSA infection nor to guide or monitor treatment for MRSA infections. Performed at [facility], [place] [address], [place], [place] [address]   Culture, blood (routine x 2)     Status: None (Preliminary result)   Collection Time: 09/17/17  7:30 AM  Result Value Ref Range Status   Specimen Description   Final    BLOOD LEFT HAND Performed at [place] [address] [place], [place] [address]    Special Requests   Final    BOTTLES DRAWN AEROBIC ONLY Blood Culture adequate volume Performed at [facility] [address], [place], [place] [address]    Culture   Final    NO GROWTH < 24 HOURS Performed at [facility] [address], [place], [place] [address]    Report Status PENDING  Incomplete  Culture, blood (routine x 2)     Status: None (Preliminary result)   Collection Time: 09/17/17  7:38 AM  Result Value Ref Range Status   Specimen Description   Final    BLOOD LEFT HAND Performed at [facility] [address], [place], [place] [address]    Special Requests   Final    BOTTLES DRAWN AEROBIC ONLY Blood Culture adequate volume Performed at [facility] [address], [place], [place] [address]    Culture   Final    NO GROWTH < 24 HOURS Performed at [facility] [address], [place], [place] [address]    Report Status PENDING  Incomplete      Radiology Studies: No results found.    Scheduled Meds: . antiseptic oral rinse  15 mL Mouth Rinse Q4H  . Chlorhexidine Gluconate Cloth  6 each Topical Daily  . collagenase   Topical Daily  . decitabine (DACOGEN) CHEMO IV infusion  20 mg/m2 (Treatment Plan Recorded) Intravenous Once  . diltiazem  60 mg Oral Q6H  . feeding supplement (ENSURE ENLIVE)  237 mL Oral BID BM  . ketorolac  1  drop Both Eyes QID  . mouth rinse  15 mL Mouth Rinse BID  . multivitamin with minerals  1 tablet Oral Daily  . nutrition supplement (JUVEN)  1 packet Oral BID BM  . polyethylene glycol  17 g Oral BID  . prednisoLONE acetate  1 drop Both Eyes QID  . sodium chloride flush  10-40 mL Intracatheter Q12H   Continuous Infusions: . sodium chloride    . meropenem (MERREM) IV Stopped (09/19/17 0938)     LOS: 13 days    Time spent: Total of 25 minutes spent with pt, greater than 50% of which was spent in discussion of  treatment, counseling and coordination of care   Chipper Oman, MD Pager: Text Page via [email or web address]   If 7PM-7AM, please contact night-coverage [email or web address] 09/19/2017, 11:52 AM   Note - This record has been created using [employer]. Chart creation errors have been sought, but may not always have been located. Such creation errors do not reflect on the standard of medical care.

## 2017-09-19 NOTE — Progress Notes (Signed)
Pharmacy Antibiotic Note  Leah Lewis is a 83 y.o. female admitted on 10/01/2017 with febrile neutropenia.  Pharmacy has been consulted for meropenemdosing. Patient already on vancomycin.  Meropenem being added for ongoing fevers as well as decubitus ulcers, may be related to leukemia. Previous culture results are unrevealing. No obvious source of infection .   Today, 09/19/2017   Day #10 total abx, Day #4 Meropenem  WBC 27.3, rising  Renal: SCr 0.86, stable  + fevers, Tmax 100.3  Repeat BCx so far negative    Plan:  Continue Meropenem 1 gr IV q12h   Continue to Follow up renal function & cultures    Height: 5\' 4"  (162.6 cm) Weight: 154 lb 5.2 oz (70 kg) IBW/kg (Calculated) : 54.7  Temp (24hrs), Avg:99 F (37.2 C), Min:97.2 F (36.2 C), Max:101.2 F (38.4 C)  Recent Labs  Lab 09/12/17 1534 09/12/17 1806  09/14/17 0500 09/15/17 0446 09/16/17 0454 09/16/17 1355 09/17/17 0500 09/17/17 0740 09/17/17 1000 09/18/17 0527 09/18/17 1220 09/19/17 0528  WBC  --   --    < > 7.2 7.3 9.0 9.5 14.9*  --   --  18.4*  --  27.3*  CREATININE  --   --    < > 1.01* 1.06* 0.97  --  0.86  --   --  0.86  --   --   LATICACIDVEN 3.1* 1.4  --   --   --   --   --   --  2.0* 2.1*  --  2.7*  --    < > = values in this interval not displayed.    Estimated Creatinine Clearance: 49.2 mL/min (by C-G formula based on SCr of 0.86 mg/dL).    Allergies  Allergen Reactions  . Percocet [Oxycodone-Acetaminophen] Other (See Comments)    "patient can take" but prefers not to    Antimicrobials this admission: 6/8 cefepime >> 6/14 6/10 vancomycin >> 6/14 6/14 meropenem >>   Dose adjustments this admission:  Microbiology results: 6/3 BCx: NGF 6/8 BCx: NGF 6/10 BCx: NGF 6/10 MRSA PCR: negative  6/15 Bcx: NGTD  Thank you for allowing pharmacy to be a part of this patient's care.  Peggyann Juba, PharmD, BCPS Pager: [phone] 09/19/2017 8:27 AM

## 2017-09-19 NOTE — Progress Notes (Signed)
It seems like Ms. Martinique is declining.  I have noted that her white blood cell count continues to go up.  This, I think, is indicator of her leukemia.  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  I know that she is trying her best.  I know that everybody is doing what they can do to try to help her out.  Her blood count today shows her white cell count to be 27.3.  Hemoglobin 8.1.  Platelet count 71,000.  Her LDH is 656.  She continues on IV antibiotics.  So far, cultures have been negative.  I am not sure she is eating all that much.  There is no obvious bleeding.  She is still having some temp spikes.  I looked at her blood under the microscope.  I can see obvious leukemic cells.  Again, this is, I think, and ominous factor.  I will have to talk to her daughter today and let her know what might be going on.  I do not think she is started the venetoclax.  She still has the atrial fibrillation.  Cardiology is helping out with this.  I very much appreciate their help.  As always, we had a very good prayer session.  She definitely is more feeble.  [name], MD  Romans 5:3-5

## 2017-09-19 NOTE — Telephone Encounter (Signed)
Oral Chemotherapy Pharmacist Encounter   Called patient's daughter Leah Lewis to complete the initial fill requirements for [place] and discuss medication copay. Patient's daughter stated she wanted to hold off on filling the medication until she spoke with her mother and that she was going to the hospital today.   I let the daughter know that in order to have the medication ready and available to start tomorrow 6/18, I would need to know their decision by 4:30 today. If they decide after that time, we will not be able start the venetoclax until Wednesday 6/19. Ms. Leah Lewis stated her understanding.  Darl Pikes, PharmD, BCPS Hematology/Oncology Clinical Pharmacist [facility] [phone]  09/19/2017 2:46 PM

## 2017-09-20 ENCOUNTER — Inpatient Hospital Stay (HOSPITAL_COMMUNITY): Payer: Medicare Other

## 2017-09-20 LAB — CBC WITH DIFFERENTIAL/PLATELET
Basophils Absolute: 0 10*3/uL (ref 0.0–0.1)
Basophils Relative: 0 %
Blasts: 4 %
Eosinophils Absolute: 0 10*3/uL (ref 0.0–0.7)
Eosinophils Relative: 0 %
HCT: 24.3 % — ABNORMAL LOW (ref 36.0–46.0)
HEMOGLOBIN: 7.8 g/dL — AB (ref 12.0–15.0)
Lymphocytes Relative: 59 %
Lymphs Abs: 16.1 10*3/uL — ABNORMAL HIGH (ref 0.7–4.0)
MCH: 29.9 pg (ref 26.0–34.0)
MCHC: 32.1 g/dL (ref 30.0–36.0)
MCV: 93.1 fL (ref 78.0–100.0)
METAMYELOCYTES PCT: 1 %
Monocytes Absolute: 1.9 10*3/uL — ABNORMAL HIGH (ref 0.1–1.0)
Monocytes Relative: 7 %
Myelocytes: 2 %
NEUTROS ABS: 8.2 10*3/uL — AB (ref 1.7–7.7)
NEUTROS PCT: 27 %
NRBC: 1 /100{WBCs} — AB
Platelets: 48 10*3/uL — ABNORMAL LOW (ref 150–400)
RBC: 2.61 MIL/uL — AB (ref 3.87–5.11)
RDW: 20.3 % — AB (ref 11.5–15.5)
WBC: 27.3 10*3/uL — AB (ref 4.0–10.5)

## 2017-09-20 LAB — COMPREHENSIVE METABOLIC PANEL
ALT: 40 U/L (ref 14–54)
ANION GAP: 10 (ref 5–15)
AST: 44 U/L — ABNORMAL HIGH (ref 15–41)
Albumin: 2.1 g/dL — ABNORMAL LOW (ref 3.5–5.0)
Alkaline Phosphatase: 59 U/L (ref 38–126)
BUN: 27 mg/dL — ABNORMAL HIGH (ref 6–20)
CHLORIDE: 104 mmol/L (ref 101–111)
CO2: 29 mmol/L (ref 22–32)
Calcium: 7.8 mg/dL — ABNORMAL LOW (ref 8.9–10.3)
Creatinine, Ser: 0.93 mg/dL (ref 0.44–1.00)
GFR, EST NON AFRICAN AMERICAN: 56 mL/min — AB (ref >60)
Glucose, Bld: 193 mg/dL — ABNORMAL HIGH (ref 65–99)
POTASSIUM: 3.1 mmol/L — AB (ref 3.5–5.1)
SODIUM: 143 mmol/L (ref 135–145)
Total Bilirubin: 1.5 mg/dL — ABNORMAL HIGH (ref 0.3–1.2)
Total Protein: 6.3 g/dL — ABNORMAL LOW (ref 6.5–8.1)

## 2017-09-20 LAB — URIC ACID: URIC ACID, SERUM: 2.8 mg/dL (ref 2.3–6.6)

## 2017-09-20 LAB — LACTATE DEHYDROGENASE: LDH: 635 U/L — AB (ref 98–192)

## 2017-09-20 LAB — PHOSPHORUS: PHOSPHORUS: 3 mg/dL (ref 2.5–4.6)

## 2017-09-20 MED ORDER — POTASSIUM CHLORIDE CRYS ER 20 MEQ PO TBCR
40.0000 meq | EXTENDED_RELEASE_TABLET | Freq: Once | ORAL | Status: AC
  Administered 2017-09-20: 40 meq via ORAL
  Filled 2017-09-20: qty 2

## 2017-09-20 MED ORDER — LIDOCAINE 5 % EX PTCH
1.0000 | MEDICATED_PATCH | Freq: Every day | CUTANEOUS | Status: DC
  Administered 2017-09-20 – 2017-10-10 (×18): 1 via TRANSDERMAL
  Filled 2017-09-20 (×24): qty 1

## 2017-09-20 MED ORDER — DILTIAZEM HCL ER 90 MG PO CP12
180.0000 mg | ORAL_CAPSULE | Freq: Two times a day (BID) | ORAL | Status: DC
  Administered 2017-09-20 – 2017-09-21 (×3): 180 mg via ORAL
  Filled 2017-09-20 (×4): qty 2

## 2017-09-20 MED ORDER — IBUPROFEN 200 MG PO TABS: 600.0000 mg | ORAL_TABLET | Freq: Three times a day (TID) | ORAL | Status: DC | PRN

## 2017-09-20 MED ORDER — DILTIAZEM HCL ER 180 MG PO CP24
180.0000 mg | ORAL_CAPSULE | Freq: Two times a day (BID) | ORAL | Status: DC
  Filled 2017-09-20: qty 1

## 2017-09-20 NOTE — Progress Notes (Signed)
So far, I really think that she has done about as well as we could expect.  It is still hard to say what we are looking at down the road.  I have to be encouraged by her white blood cell count.  It is 27,000.  However, her monocytes are down quite a bit.  Hopefully, this is an indicator that the Dacogen is toxic to her leukemic cells.  Of note she does have adverse chromosome abnormalities.  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  I think she will start her venetoclax today.  Her uric acid is only 2.8.  In the study that used Dacogen along with venetoclax, there is no tumor lysis that was appreciated.  I think, we should hopefully start to see the white cell count decreased, and her LDH start going down.  Her potassium is 3.1.  Her creatinine is 0.93.  Her uric acid again is only 2.8.  She is still not eating all that much.  We really need to get nutrition into her.  I would hate to have to use a feeding tube.  However, this might be necessary.  There is been no bleeding.  Her fevers are still spiking.  She continues on the IV antibiotics.  So far, her cultures have all been negative.  I know that she is trying real hard.  I know that the staff in the ICU are doing a tremendous job in trying to help her.  Again, she will start the venetoclax today.  Currently, she is afebrile.  Her A. fib is better controlled with a heart rate of 96.  Her blood pressure is 100/64.  We still have a "long way to go".  However, may be, just may be we could be "turning the corner" and start to see her white cell count trend downward.  Again, I cannot think everybody in the ICU enough for all the work that they have put in for Leah Lewis and her family.  [name], MD  3 John 1:2

## 2017-09-20 NOTE — Progress Notes (Signed)
PROGRESS NOTE [facility]   Jamison J Lewis   DJM:[id number] DOB: 02-28-1935  DOA: 09/21/2017 PCP: Wenda Low, MD   Brief Narrative:  Leah Lewis is a 83 year old female with past medical history of osteoporosis, hyperlipidemia, DVT on Xarelto presented to the emergency department complaining of generalized weakness.  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  During hospital stay patient spiking fevers, being treated with antibiotic but obvious source of infection.  Patient developed A. fib with RVR, cardiology consulted and treated with Cardizem.  Thrombocytopenia worsened and she was transfused 1 unit of platelets.  Per oncology poor prognosis and palliative care has been consulted.  Patient has been started on chemotherapy.  Heart rate improving.  Subjective: Patient seen and examined, she reported feeling okay today.  Not eating much.  Afebrile overnight.  Report R shoulder pain.  Heart rate improved, denies chest pain.  Assessment & Plan: AML   Causing generalized weakness and thrombocytopenia, leukocytosis worsened today. Oncology following and started on chemotherapy 6/13.  Planning to start another medication tomorrow PT recommending home health PT and 24-hour assistance, continue fall precautions Encourage oral hydration and nutrition. Palliative care consulted, discussed with family they will decide on DNR status in 1 to 2 days.  Thrombocytopenia Secondary to AML Status post 3 units of platelet transfusion during hospital stay No signs of overt bleeding, continue to monitor CBC  New diagnosis A. fib with RVR - improved Cardiology consulted and initially treated with Cardizem drip, she has been transitioned to Cardizem p.o.  Rhythm remains in A. fib with good control.  Continue Cardizem 180 mg twice daily Not on anticoagulation due to thrombocytopenia and anemia.  HTN Blood pressure stable Continue  Cardizem and labetalol as needed Continue to monitor  Hypokalemia Improved Replete as needed  Persistent fever - improved  No clear source of active infection at this time, patient was treated with IV vancomycin and IV cefepime, subsequently switched to meropenem on 6/14.  There is some infiltrates on chest x-ray from 6/15.  CT chest 6/17 shows peri-Hilliary peribronchiolar groundglass and consolidation with peripheral sparing findings may be related to leukostasis from AML, pulmonary hemorrhage or pulmonary edema.  Procalcitonin was elevated.  MRI was negative for osteomyelitis, she has received 11 days total of antibiotics.  Blood cultures remain negative.  Will discontinue antibiotics and continue to monitor.  History of DVT Previously on Xarelto, on hold due to thrombocytopenia  Unstageable sacral decubitus ulcer Seems to be stable, patient on antibiotics for possible infectious process however MRI with no signs of osteomyelitis.  Wound care recommendations appreciated  R Shoulder pain  Xray shows degenerative diseases  Continue pain control   DVT prophylaxis: SCDs Code Status: Full code Family Communication: None at bedside Disposition Plan: Transfer to oncology floor  Consultants:   Oncology  Cardiology  Procedures:   none  Antimicrobials: Anti-infectives (From admission, onward)   Start     Dose/Rate Route Frequency Ordered Stop   09/16/17 1400  meropenem (MERREM) 1 g in sodium chloride 0.9 % 100 mL IVPB     1 g 200 mL/hr over 30 Minutes Intravenous Every 12 hours 09/16/17 1303     09/13/17 1400  vancomycin (VANCOCIN) IVPB 750 mg/150 ml premix  Status:  Discontinued     750 mg 150 mL/hr over 60 Minutes Intravenous Every 24 hours 09/12/17 1302 09/16/17 1620   09/12/17 1400  vancomycin (VANCOCIN) 1,500 mg in sodium chloride  0.9 % 500 mL IVPB     1,500 mg 250 mL/hr over 120 Minutes Intravenous  Once 09/12/17 1302 09/12/17 1600   09/11/17 1800  ceFEPIme (MAXIPIME) 2  g in sodium chloride 0.9 % 100 mL IVPB  Status:  Discontinued     2 g 200 mL/hr over 30 Minutes Intravenous Every 12 hours 09/11/17 1208 09/16/17 1143   09/10/17 1400  ceFEPIme (MAXIPIME) 2 g in sodium chloride 0.9 % 100 mL IVPB  Status:  Discontinued     2 g 200 mL/hr over 30 Minutes Intravenous Every 8 hours 09/10/17 1253 09/11/17 1208       Objective: Vitals:   09/20/17 0400 09/20/17 0600 09/20/17 0728 09/20/17 0800  BP: (!) 94/50   100/64  Pulse: 91   81  Resp: (!) 43   (!) 40  Temp: 97.9 F (36.6 C)  98.4 F (36.9 C)   TempSrc: Oral  Oral   SpO2: 94%   95%  Weight:  70.1 kg (154 lb 8.7 oz)    Height:        Intake/Output Summary (Last 24 hours) at 09/20/2017 0834 Last data filed at 09/20/2017 0600 Gross per 24 hour  Intake 700 ml  Output 1700 ml  Net -1000 ml   Filed Weights   09/18/17 1200 09/19/17 0500 09/20/17 0600  Weight: 68.5 kg (151 lb 0.2 oz) 70 kg (154 lb 5.2 oz) 70.1 kg (154 lb 8.7 oz)    Examination:  General: Ill Looking,  Cardiovascular: Irr Irr, S1/S2 +, no rubs, no gallops Respiratory: Decrease BS b/l No wheezing or crackles  Abdominal: Soft, NT, ND, bowel sounds + Extremities: no edema, no cyanosis. R Shoulder pain with ROM  Skin: No Rash  Data Reviewed: I have personally reviewed following labs and imaging studies  CBC: Recent Labs  Lab 09/16/17 1355 09/17/17 0500 09/18/17 0527 09/19/17 0528 09/20/17 0517  WBC 9.5 14.9* 18.4* 27.3* 27.3*  NEUTROABS 2.3  --   --   --  8.2*  HGB 9.5* 8.8* 9.1* 8.1* 7.8*  HCT 29.1* 26.6* 28.4* 24.9* 24.3*  MCV 94.2 94.0 93.1 93.6 93.1  PLT 68* 42* 23* 71* 48*   Basic Metabolic Panel: Recent Labs  Lab 09/15/17 0446 09/16/17 0454 09/17/17 0500 09/18/17 0527 09/20/17 0517  NA 142 140 140 142 143  K 3.3* 2.9* 3.1* 3.8 3.1*  CL 108 106 104 105 104  CO2 27 26 29 27 29   GLUCOSE 157* 143* 207* 181* 193*  BUN 19 16 15 19  27*  CREATININE 1.06* 0.97 0.86 0.86 0.93  CALCIUM 7.4* 7.6* 7.6* 7.8* 7.8*    MG  --   --  1.9  --   --   PHOS  --   --   --   --  3.0   GFR: Estimated Creatinine Clearance: 45.6 mL/min (by C-G formula based on SCr of 0.93 mg/dL). Liver Function Tests: Recent Labs  Lab 09/16/17 0454 09/17/17 0500 09/18/17 0527 09/20/17 0517  AST 40 73* 61* 44*  ALT 26 46 47 40  ALKPHOS 48 57 59 59  BILITOT 0.9 1.5* 1.3* 1.5*  PROT 6.0* 6.0* 6.4* 6.3*  ALBUMIN 2.0* 2.2* 2.2* 2.1*   No results for input(s): LIPASE, AMYLASE in the last 168 hours. No results for input(s): AMMONIA in the last 168 hours. Coagulation Profile: No results for input(s): INR, PROTIME in the last 168 hours. Cardiac Enzymes: No results for input(s): CKTOTAL, CKMB, CKMBINDEX, TROPONINI in the last 168 hours. BNP (  last 3 results) No results for input(s): PROBNP in the last 8760 hours. HbA1C: No results for input(s): HGBA1C in the last 72 hours. CBG: No results for input(s): GLUCAP in the last 168 hours. Lipid Profile: No results for input(s): CHOL, HDL, LDLCALC, TRIG, CHOLHDL, LDLDIRECT in the last 72 hours. Thyroid Function Tests: No results for input(s): TSH, T4TOTAL, FREET4, T3FREE, THYROIDAB in the last 72 hours. Anemia Panel: No results for input(s): VITAMINB12, FOLATE, FERRITIN, TIBC, IRON, RETICCTPCT in the last 72 hours. Sepsis Labs: Recent Labs  Lab 09/17/17 0740 09/17/17 1000 09/18/17 1220 09/19/17 0850  PROCALCITON 1.60  --   --   --   LATICACIDVEN 2.0* 2.1* 2.7* 1.5    Recent Results (from the past 240 hour(s))  Culture, blood (routine x 2)     Status: None   Collection Time: 09/10/17  8:45 AM  Result Value Ref Range Status   Specimen Description BLOOD LEFT ARM  Final   Special Requests   Final    BOTTLES DRAWN AEROBIC ONLY Blood Culture adequate volume   Culture   Final    NO GROWTH 5 DAYS Performed at [facility], [address], [place], [place] [address]    Report Status 09/15/2017 FINAL  Final  Culture, blood (routine x 2)     Status: None   Collection  Time: 09/10/17  8:58 AM  Result Value Ref Range Status   Specimen Description BLOOD LEFT ARM  Final   Special Requests   Final    BOTTLES DRAWN AEROBIC AND ANAEROBIC Blood Culture adequate volume   Culture   Final    NO GROWTH 5 DAYS Performed at [facility], [place] [address] [place], [place] [address]    Report Status 09/15/2017 FINAL  Final  Culture, blood (routine x 2)     Status: None   Collection Time: 09/12/17 12:50 PM  Result Value Ref Range Status   Specimen Description   Final    BLOOD RIGHT ANTECUBITAL Performed at [facility] [address], [place], [place] [address]    Special Requests   Final    BOTTLES DRAWN AEROBIC ONLY Blood Culture adequate volume Performed at [facility] [address], [place], [place] [address]    Culture   Final    NO GROWTH 5 DAYS Performed at [facility], [place] [address] [place], [place] [address]    Report Status 09/17/2017 FINAL  Final  Culture, blood (routine x 2)     Status: None   Collection Time: 09/12/17 12:50 PM  Result Value Ref Range Status   Specimen Description   Final    BLOOD RIGHT HAND Performed at [facility] [address], [place], [place] [address]    Special Requests   Final    BOTTLES DRAWN AEROBIC AND ANAEROBIC Blood Culture adequate volume Performed at [facility] [address], [place], [place] [address]    Culture   Final    NO GROWTH 5 DAYS Performed at [facility], [place] [address], [place], [place] [address]    Report Status 09/17/2017 FINAL  Final  MRSA PCR Screening     Status: None   Collection Time: 09/12/17  3:38 PM  Result Value Ref Range Status   MRSA by PCR NEGATIVE NEGATIVE Final    Comment:        The GeneXpert MRSA Assay (FDA approved for NASAL specimens only), is one  component of a comprehensive MRSA colonization surveillance program. It is not intended to diagnose MRSA infection nor  to guide or monitor treatment for MRSA infections. Performed at [facility], [place] [address], [place], [place] [address]   Culture, blood (routine x 2)     Status: None (Preliminary result)   Collection Time: 09/17/17  7:30 AM  Result Value Ref Range Status   Specimen Description   Final    BLOOD LEFT HAND Performed at [facility] [address] [place], [place] [address]    Special Requests   Final    BOTTLES DRAWN AEROBIC ONLY Blood Culture adequate volume Performed at [facility] [address] [place], [place] [address]    Culture   Final    NO GROWTH 2 DAYS Performed at [facility] [address], [place], [place] [address]    Report Status PENDING  Incomplete  Culture, blood (routine x 2)     Status: None (Preliminary result)   Collection Time: 09/17/17  7:38 AM  Result Value Ref Range Status   Specimen Description   Final    BLOOD LEFT HAND Performed at [facility] [address], [place], [place] [address]    Special Requests   Final    BOTTLES DRAWN AEROBIC ONLY Blood Culture adequate volume Performed at [facility] [address], [place], [place] [address]    Culture   Final    NO GROWTH 2 DAYS Performed at [facility] [address], [place], [place] [address]    Report Status PENDING  Incomplete      Radiology Studies: Ct Chest Wo Contrast  Result Date: 09/19/2017 CLINICAL DATA:  Generalized weakness and fatigue. Fever, anemia. AML. EXAM: CT CHEST WITHOUT CONTRAST TECHNIQUE: Multidetector CT imaging of the chest was performed following the standard protocol without IV contrast. COMPARISON:  None. FINDINGS: Cardiovascular: Right PICC tip terminates junction at the SVC. Atherosclerotic calcification of the arterial vasculature, including coronary arteries. Pulmonary arteries and heart are enlarged. No pericardial effusion. Mediastinum/Nodes:  Thyroid is heterogeneous. Mediastinal lymph nodes measure up to 10 mm in the low right paratracheal station. Hilar regions are difficult to evaluate without IV contrast. Subpectoral and axillary lymph nodes are not enlarged by CT size criteria. Esophagus is grossly unremarkable. Lungs/Pleura: Image quality is rather degraded by respiratory motion. There is perihilar peribronchovascular ground-glass and consolidation with peripheral sparing. Septal thickening is not an overwhelming feature. No pleural fluid. Airway is unremarkable. Upper Abdomen: Visualized portions of the liver, adrenal glands, kidneys, spleen, pancreas, stomach and bowel are grossly unremarkable. Musculoskeletal: Degenerative changes in the spine. No worrisome lytic or sclerotic lesions. IMPRESSION: 1. Perihilar peribronchovascular ground-glass and consolidation with peripheral sparing. Findings may be due to leukostasis related to AML, pulmonary hemorrhage or pulmonary edema. 2. Borderline enlarged mediastinal lymph nodes. 3. Aortic atherosclerosis (ICD10-170.0). Coronary artery calcification. 4. Enlarged pulmonary arteries, indicative of pulmonary arterial hypertension. Electronically Signed   By: Lorin Picket M.D.   On: 09/19/2017 16:56      Scheduled Meds: . antiseptic oral rinse  15 mL Mouth Rinse Q4H  . Chlorhexidine Gluconate Cloth  6 each Topical Daily  . collagenase   Topical Daily  . diltiazem  180 mg Oral Q12H  . feeding supplement (ENSURE ENLIVE)  237 mL Oral BID BM  . ketorolac  1 drop Both Eyes QID  . mouth rinse  15 mL Mouth Rinse BID  .  multivitamin with minerals  1 tablet Oral Daily  . nutrition supplement (JUVEN)  1 packet Oral BID BM  . polyethylene glycol  17 g Oral BID  . potassium chloride  40 mEq Oral Once  . prednisoLONE acetate  1 drop Both Eyes QID  . sodium chloride flush  10-40 mL Intracatheter Q12H   Continuous Infusions: . sodium chloride    . meropenem (MERREM) IV Stopped (09/19/17 2314)      LOS: 14 days    Time spent: Total of 25 minutes spent with pt, greater than 50% of which was spent in discussion of  treatment, counseling and coordination of care   Chipper Oman, MD Pager: Text Page via [email or web address]   If 7PM-7AM, please contact night-coverage [email or web address] 09/20/2017, 8:34 AM   Note - This record has been created using [employer]. Chart creation errors have been sought, but may not always have been located. Such creation errors do not reflect on the standard of medical care.

## 2017-09-20 NOTE — Telephone Encounter (Signed)
Oral Chemotherapy Pharmacist Encounter   Spoke with Leah Lewis to follow-up on filling the venetoclax. She stated that other family members wanted to be in charge so she is stepping back from making decisions.  Spoke with [facility] NP who participated in a family meeting yesterday and she suggested I reach out to [name] daughter [name].    Darl Pikes, PharmD, BCPS Hematology/Oncology Clinical Pharmacist [facility] [phone]  09/20/2017 3:29 PM

## 2017-09-20 NOTE — Progress Notes (Signed)
Palliative:  I met again today with Leah Lewis and I caught Leah Lewis on her way out. Leah Lewis shares that she spoke with Dr. Marin Lewis and is encouraged. She continues to be hopeful. She relays that he was pleased that her labs have not worsened and is hopeful that the chemotherapy is working on the cancer cells as it should.   I also spoke with RN who shares that appetite is still poor but will eat a few bites and drink ~1/2 supplements. Right shoulder has been painful and limiting movement. Leah Lewis also endorses right shoulder pain and lifts her right arm but only briefly before she uses her left arm to lift her right arm up a little higher but not above shoulder d/t pain. Will add Lidoderm patch to right shoulder. Consider scheduled Tylenol or Motrin for pain relief if prn not providing relief. I did try and speak with Leah Lewis about poor intake and being sick. She is unable to give me any details about her illness but is able to tell me about her shoulder pain. I did mention to her that if appetite does not improve a feeding tube has been mentioned. I asked what she thought of feeding tubes but she did not respond to me. I asked if she is unsure what she thinks about them and she confirms. I believe decisions will have to be up to majority of reasonably available 5 adult children.   Later: I received call from outpatient pharmacist trying to seek clarity on beginning oral chemotherapy. She has spoken with Leah Lewis who shares that she is not making decisions (she shared this with me yesterday) and pharmacist unsure about plan for oral chemotherapy. Per my conversation I feel family would desire treatment. Since Leah Lewis is not willing to make decisions regarding oral chemotherapy this would be for Leah Lewis who is the only other child that lives locally and "reasonably available." Leah Lewis relies heavily on her sons Leah Lewis and Leah Lewis.   I called and spoke with Leah's son Leah Lewis who  family requested we contact (and his brother Leah Lewis as secondary). I explained my above updates regarding nutrition, shoulder pain limiting physical activity, and Leah Lewis' conversation with Dr. Marin Lewis. Leah Lewis says that his mother Leah Lewis or brother Leah Lewis would be the ones to get the oral chemotherapy.   I have spoken today with daughter Leah Lewis and grandson Leah Lewis as instructed by family. I attempted to call children Leah Lewis (call answered but hung up) and with Leah Lewis (no answer). I also attempted to call grandson Leah Lewis as well. Family dynamics are becoming very difficult at this time.   67 min  Leah Sill, NP Palliative Medicine Team Pager # [phone] (M-F 8a-5p) Team Phone # [phone] (Nights/Weekends)

## 2017-09-20 NOTE — Progress Notes (Addendum)
Progress Note  Patient Name: Leah Lewis Date of Encounter: 09/20/2017  Primary Cardiologist: Dr. Thompson Grayer, MD  Subjective   Pt reports some shoulder pain today. Denies chest pain. HR improved today  Inpatient Medications    Scheduled Meds: . antiseptic oral rinse  15 mL Mouth Rinse Q4H  . Chlorhexidine Gluconate Cloth  6 each Topical Daily  . collagenase   Topical Daily  . diltiazem  180 mg Oral Q12H  . feeding supplement (ENSURE ENLIVE)  237 mL Oral BID BM  . ketorolac  1 drop Both Eyes QID  . mouth rinse  15 mL Mouth Rinse BID  . multivitamin with minerals  1 tablet Oral Daily  . nutrition supplement (JUVEN)  1 packet Oral BID BM  . polyethylene glycol  17 g Oral BID  . prednisoLONE acetate  1 drop Both Eyes QID  . sodium chloride flush  10-40 mL Intracatheter Q12H   Continuous Infusions: . sodium chloride    . meropenem (MERREM) IV Stopped (09/19/17 2314)   PRN Meds: acetaminophen **OR** acetaminophen, alteplase, heparin lock flush, heparin lock flush, HYDROcodone-acetaminophen, labetalol, ondansetron (ZOFRAN) IV, sodium chloride flush, sodium chloride flush   Vital Signs    Vitals:   09/20/17 0000 09/20/17 0400 09/20/17 0600 09/20/17 0728  BP: 102/63     Pulse: 82     Resp: (!) 36     Temp: 98.7 F (37.1 C) 97.9 F (36.6 C)  98.4 F (36.9 C)  TempSrc: Oral Oral  Oral  SpO2: 97%     Weight:   154 lb 8.7 oz (70.1 kg)   Height:        Intake/Output Summary (Last 24 hours) at 09/20/2017 0736 Last data filed at 09/20/2017 0600 Gross per 24 hour  Intake 700 ml  Output 1700 ml  Net -1000 ml   Filed Weights   09/18/17 1200 09/19/17 0500 09/20/17 0600  Weight: 151 lb 0.2 oz (68.5 kg) 154 lb 5.2 oz (70 kg) 154 lb 8.7 oz (70.1 kg)   Physical Exam   General: Frail, elderly, ill-appearing, NAD Skin: Warm, dry, intact  Head: Normocephalic, atraumatic, clear, moist mucus membranes. Neck: Negative for carotid bruits. No JVD Lungs:Clear to  ausculation bilaterally. No wheezes, rales, or rhonchi. Breathing is unlabored. Cardiovascular: Irregularly irregular with S1 S2. No murmurs, rubs, gallops, or LV heave appreciated. Abdomen: Soft, non-tender, non-distended with normoactive bowel sounds. No obvious abdominal masses. MSK: Strength and tone appear normal for age. 5/5 in all extremities Extremities: No edema. No clubbing or cyanosis. DP/PT pulses 2+ bilaterally Neuro: Alert and oriented to self and place. No focal deficits. No facial asymmetry. MAE spontaneously. Psych: Responds to questions somewhat appropriately with normal affect.    Labs    Chemistry Recent Labs  Lab 09/17/17 0500 09/18/17 0527 09/20/17 0517  NA 140 142 143  K 3.1* 3.8 3.1*  CL 104 105 104  CO2 29 27 29   GLUCOSE 207* 181* 193*  BUN 15 19 27*  CREATININE 0.86 0.86 0.93  CALCIUM 7.6* 7.8* 7.8*  PROT 6.0* 6.4* 6.3*  ALBUMIN 2.2* 2.2* 2.1*  AST 73* 61* 44*  ALT 46 47 40  ALKPHOS 57 59 59  BILITOT 1.5* 1.3* 1.5*  GFRNONAA >60 >60 56*  GFRAA >60 >60 >60  ANIONGAP 7 10 10     Hematology Recent Labs  Lab 09/17/17 0500 09/18/17 0527 09/19/17 0528  WBC 14.9* 18.4* 27.3*  RBC 2.83* 3.05* 2.66*  HGB 8.8* 9.1* 8.1*  HCT 26.6* 28.4* 24.9*  MCV 94.0 93.1 93.6  MCH 31.1 29.8 30.5  MCHC 33.1 32.0 32.5  RDW 19.4* 20.0* 20.1*  PLT 42* 23* 71*   Cardiac EnzymesNo results for input(s): TROPONINI in the last 168 hours. No results for input(s): TROPIPOC in the last 168 hours.  BNPNo results for input(s): BNP, PROBNP in the last 168 hours.   DDimer No results for input(s): DDIMER in the last 168 hours.   Radiology    Ct Chest Wo Contrast  Result Date: 09/19/2017 CLINICAL DATA:  Generalized weakness and fatigue. Fever, anemia. AML. EXAM: CT CHEST WITHOUT CONTRAST TECHNIQUE: Multidetector CT imaging of the chest was performed following the standard protocol without IV contrast. COMPARISON:  None. FINDINGS: Cardiovascular: Right PICC tip terminates  junction at the SVC. Atherosclerotic calcification of the arterial vasculature, including coronary arteries. Pulmonary arteries and heart are enlarged. No pericardial effusion. Mediastinum/Nodes: Thyroid is heterogeneous. Mediastinal lymph nodes measure up to 10 mm in the low right paratracheal station. Hilar regions are difficult to evaluate without IV contrast. Subpectoral and axillary lymph nodes are not enlarged by CT size criteria. Esophagus is grossly unremarkable. Lungs/Pleura: Image quality is rather degraded by respiratory motion. There is perihilar peribronchovascular ground-glass and consolidation with peripheral sparing. Septal thickening is not an overwhelming feature. No pleural fluid. Airway is unremarkable. Upper Abdomen: Visualized portions of the liver, adrenal glands, kidneys, spleen, pancreas, stomach and bowel are grossly unremarkable. Musculoskeletal: Degenerative changes in the spine. No worrisome lytic or sclerotic lesions. IMPRESSION: 1. Perihilar peribronchovascular ground-glass and consolidation with peripheral sparing. Findings may be due to leukostasis related to AML, pulmonary hemorrhage or pulmonary edema. 2. Borderline enlarged mediastinal lymph nodes. 3. Aortic atherosclerosis (ICD10-170.0). Coronary artery calcification. 4. Enlarged pulmonary arteries, indicative of pulmonary arterial hypertension. Electronically Signed   By: Lorin Picket M.D.   On: 09/19/2017 16:56   Telemetry     Atrial fibrillation HR 80's today- Personally Reviewed  ECG    No new tracings as of 09/20/17- Personally Reviewed  Cardiac Studies   Echocardiogram 09/07/17: Study Conclusions  - Left ventricle: The cavity size was normal. Wall thickness was normal. Systolic function was normal. The estimated ejection fraction was in the range of 60% to 65%. Wall motion was normal; there were no regional wall motion abnormalities. Doppler parameters are consistent with abnormal left  ventricular relaxation (grade 1 diastolic dysfunction). The E/e&' ratio is <8, suggesting normal LV filling pressure. - Aortic valve: Trileaflet. Sclerosis without stenosis. Transvalvular velocity was minimally increased. There was mild regurgitation. - Aorta: Ascending aortic diameter: 39 mm (S). - Ascending aorta: The ascending aorta was mildly dilated. - Mitral valve: Mildly thickened leaflets . There was trivial regurgitation. - Left atrium: The atrium was normal in size. - Right atrium: Mildly dilated. - Tricuspid valve: There was mild regurgitation. - Pulmonary arteries: PA peak pressure: 32 mm Hg (S). - Inferior vena cava: The vessel was normal in size. The respirophasic diameter changes were in the normal range (>= 50%), consistent with normal central venous pressure.  Impressions:  - LVEF 60-65%, normal wall thickness, normal wall motion, grade 1 DD, normal LV filling pressure, aortic valve sclerosis with mild AI, dilated ascending aorta to 3.9 cm, trivial MR, normal LA size, mild RAE, mild TR, RVSP 32 mmHg, normal IVC.  Patient Profile     83 y.o. female with a hx of HTN, prior stroke, hx of DVT and current AML who is being followed by Cardiology for new onset atrial fibrillation  in the setting of acute illness.   Assessment & Plan    1. New onset atrial fibrillation: -Remains in atrial fibrillation, HR's 80-90 range  -Not currently a candidate for anticoagulation despite CHA2DS2VASc score of 6 secondary to thrombocytopenia in the setting of new onset AML (per primary) -Plan is for rate control until anemia/thrombocytopemia are resolved>>>then could consider anticoagulation>>NSR   -Diltiazem increased yesterday with HR improvement, continue diltiazem SR 180mg  Q12H. BP's soft but stable at 102/63>121/87>124/45. Not much room for titration given lower BP  -Asymptomatic, denies palpitations  2. HTN: -Stable, 102/63>121/87>124/45 -Continue  diltiazem for now and monitor BP closely   3. Thrombocytopenia secondary to AML: -Platlets yesterday, 71>>improved from 23 on 09/18/17 -Per Oncology and Internal Medicine  -Palliative care consulted  4. Hypokalemia: -K+, 3.1 today -Will replete with KDUR 16meq x1 and follow with BMET in AM    Signed, Kathyrn Drown NP-C [place] Pager: [phone] 09/20/2017, 7:36 AM     For questions or updates, please contact   Please consult [email or web address] for contact info under Cardiology/STEMI.  The patient was seen, examined and discussed with Kathyrn Drown, NP  and I agree with the above.   83 year old female with h/o AML, thrombocytopenia, anemia, new onset a-fib with RVR, today febrile, I would continue Cardizem to SR 180 mg PO BID, she is rate controlled now, with ventricular rates in 80'. No anticoagulation given low platelets. We will sign off, call us with questions.   Ena Dawley, MD 09/20/2017

## 2017-09-21 ENCOUNTER — Inpatient Hospital Stay (HOSPITAL_COMMUNITY): Payer: Medicare Other

## 2017-09-21 DIAGNOSIS — C9201 Acute myeloblastic leukemia, in remission: Secondary | ICD-10-CM

## 2017-09-21 LAB — CBC WITH DIFFERENTIAL/PLATELET
BLASTS: 4 %
Basophils Absolute: 0 10*3/uL (ref 0.0–0.1)
Basophils Relative: 0 %
EOS PCT: 0 %
Eosinophils Absolute: 0 10*3/uL (ref 0.0–0.7)
HCT: 22.7 % — ABNORMAL LOW (ref 36.0–46.0)
Hemoglobin: 7.3 g/dL — ABNORMAL LOW (ref 12.0–15.0)
LYMPHS ABS: 1.8 10*3/uL (ref 0.7–4.0)
Lymphocytes Relative: 7 %
MCH: 30.3 pg (ref 26.0–34.0)
MCHC: 32.2 g/dL (ref 30.0–36.0)
MCV: 94.2 fL (ref 78.0–100.0)
MONO ABS: 12 10*3/uL — AB (ref 0.1–1.0)
MYELOCYTES: 5 %
Metamyelocytes Relative: 3 %
Monocytes Relative: 46 %
NEUTROS ABS: 11.2 10*3/uL — AB (ref 1.7–7.7)
NEUTROS PCT: 35 %
Platelets: 17 10*3/uL — CL (ref 150–400)
RBC: 2.41 MIL/uL — AB (ref 3.87–5.11)
RDW: 20.5 % — AB (ref 11.5–15.5)
WBC: 26 10*3/uL — AB (ref 4.0–10.5)

## 2017-09-21 LAB — COMPREHENSIVE METABOLIC PANEL
ALBUMIN: 2 g/dL — AB (ref 3.5–5.0)
ALK PHOS: 57 U/L (ref 38–126)
ALT: 42 U/L (ref 14–54)
ANION GAP: 12 (ref 5–15)
AST: 51 U/L — ABNORMAL HIGH (ref 15–41)
BILIRUBIN TOTAL: 1.7 mg/dL — AB (ref 0.3–1.2)
BUN: 35 mg/dL — ABNORMAL HIGH (ref 6–20)
CALCIUM: 7.5 mg/dL — AB (ref 8.9–10.3)
CO2: 26 mmol/L (ref 22–32)
Chloride: 107 mmol/L (ref 101–111)
Creatinine, Ser: 1.2 mg/dL — ABNORMAL HIGH (ref 0.44–1.00)
GFR calc Af Amer: 48 mL/min — ABNORMAL LOW (ref >60)
GFR, EST NON AFRICAN AMERICAN: 41 mL/min — AB (ref >60)
GLUCOSE: 395 mg/dL — AB (ref 65–99)
POTASSIUM: 4.1 mmol/L (ref 3.5–5.1)
Sodium: 145 mmol/L (ref 135–145)
TOTAL PROTEIN: 5.9 g/dL — AB (ref 6.5–8.1)

## 2017-09-21 LAB — SAVE SMEAR

## 2017-09-21 LAB — URIC ACID: URIC ACID, SERUM: 3.3 mg/dL (ref 2.3–6.6)

## 2017-09-21 LAB — GLUCOSE, CAPILLARY: GLUCOSE-CAPILLARY: 333 mg/dL — AB (ref 65–99)

## 2017-09-21 LAB — PHOSPHORUS: PHOSPHORUS: 2.6 mg/dL (ref 2.5–4.6)

## 2017-09-21 LAB — PREPARE RBC (CROSSMATCH)

## 2017-09-21 LAB — LACTATE DEHYDROGENASE: LDH: 619 U/L — ABNORMAL HIGH (ref 98–192)

## 2017-09-21 MED ORDER — SODIUM CHLORIDE 0.9% IV SOLUTION: Freq: Once | INTRAVENOUS | Status: DC

## 2017-09-21 MED ORDER — IBUPROFEN 200 MG PO TABS: 600.0000 mg | ORAL_TABLET | Freq: Three times a day (TID) | ORAL | Status: DC | PRN

## 2017-09-21 MED ORDER — HYDROCODONE-ACETAMINOPHEN 5-325 MG PO TABS: 1.0000 | ORAL_TABLET | ORAL | Status: DC | PRN

## 2017-09-21 MED ORDER — FUROSEMIDE 10 MG/ML IJ SOLN
20.0000 mg | Freq: Once | INTRAMUSCULAR | Status: AC
  Administered 2017-09-21: 20 mg via INTRAVENOUS
  Filled 2017-09-21: qty 2

## 2017-09-21 MED ORDER — ALTEPLASE 2 MG IJ SOLR
2.0000 mg | Freq: Once | INTRAMUSCULAR | Status: AC
  Administered 2017-09-21: 2 mg
  Filled 2017-09-21: qty 2

## 2017-09-21 MED ORDER — INSULIN ASPART 100 UNIT/ML ~~LOC~~ SOLN
15.0000 [IU] | Freq: Once | SUBCUTANEOUS | Status: AC
  Administered 2017-09-21: 15 [IU] via SUBCUTANEOUS

## 2017-09-21 NOTE — Progress Notes (Signed)
PROGRESS NOTE    Leah Lewis  JOA:[id number] DOB: 05/05/1934 DOA: 09/26/2017 PCP: Wenda Low, MD  Brief Narrative: 83 year old female with past medical history of osteoporosis, hyperlipidemia, DVT on Xarelto presented to the emergency department complaining of generalized weakness.  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  During hospital stay patient spiking fevers, being treated with antibiotic but obvious source of infection.  Patient developed A. fib with RVR, cardiology consulted and treated with Cardizem.  Thrombocytopenia worsened and she was transfused 1 unit of platelets.  Per oncology poor prognosis and palliative care has been consulted.  Patient has been started on chemotherapy.  Heart rate improving.     Assessment & Plan:   Active Problems:   Pressure injury of skin   AML (acute myeloid leukemia) (HCC)   Goals of care, counseling/discussion   AML (acute myeloblastic leukemia) ([place])   Advance care planning   Palliative care by specialist   Fever   Pancytopenia ([place])   Atrial fibrillation with RVR (HCC)  AML   Causing generalized weakness and thrombocytopenia, leukocytosis worsened today. Oncology following and started on chemotherapy 6/13.  Planning to start another medication tomorrow PT recommending home health PT and 24-hour assistance, continue fall precautions Encourage oral hydration and nutrition. Palliative care consulted, discussed with family they will decide on DNR status in 1 to 2 days.  Thrombocytopenia/anemia Secondary to AML Status post 3 units of platelet transfusion during hospital stay No signs of overt bleeding, continue to monitor CBC Platelet transfusion and packed RBCs ordered for today.  New diagnosis A. fib with RVR - improved Cardiology consulted and initially treated with Cardizem drip, she has been transitioned to Cardizem p.o.  Rhythm remains in A. fib with good  control.  Continue Cardizem 180 mg twice daily Not on anticoagulation due to thrombocytopenia and anemia.  HTN Blood pressure stable Continue Cardizem and labetalol as needed Continue to monitor  Hypokalemia Improved Replete as needed  Persistent fever - improved  No clear source of active infection at this time, patient was treated with IV vancomycin and IV cefepime, subsequently switched to meropenem on 6/14.  There is some infiltrates on chest x-ray from 6/15.  CT chest 6/17 shows peri-Hilliary peribronchiolar groundglass and consolidation with peripheral sparing findings may be related to leukostasis from AML, pulmonary hemorrhage or pulmonary edema.  Procalcitonin was elevated.  MRI was negative for osteomyelitis, she has received 11 days total of antibiotics.  Blood cultures remain negative.  Will discontinue antibiotics and continue to monitor. Stat chest x-ray ordered this morning to some consolidation since patient received 11 days of antibiotics and will not restart her on antibiotics at this time but place her on oxygen as needed.  History of DVT Previously on Xarelto, on hold due to thrombocytopenia  Unstageable sacral decubitus ulcer Seems to be stable, patient on antibiotics for possible infectious process however MRI with no signs of osteomyelitis.  Wound care recommendations appreciated  R Shoulder pain  Xray shows degenerative diseases  Continue pain control        DVT prophylaxis: SCD Code Status:FULL Family Communication:DW DAUGHTER [name].  She states she is not cooking or on she does not know who the POA is at this time Disposition Plan:TBD   Consultants: ONC  Procedures: NONE Antimicrobials: NONE Subjective: RN reports that patient reports that this morning she was increased from 2 L of oxygen she is resting in bed with eyes closed not  following commands.   Objective: Vitals:   09/21/17 0523 09/21/17 0526 09/21/17 0550 09/21/17 1051  BP:  (!) 108/52   100/69  Pulse: 85  90 87  Resp: (!) 44   (!) 32  Temp: 99.1 F (37.3 C)     TempSrc: Axillary     SpO2: (!) 89%  95% 100%  Weight:  74.1 kg (163 lb 5.8 oz)    Height:        Intake/Output Summary (Last 24 hours) at 09/21/2017 1112 Last data filed at 09/21/2017 0134 Gross per 24 hour  Intake 240 ml  Output 450 ml  Net -210 ml   Filed Weights   09/19/17 0500 09/20/17 0600 09/21/17 0526  Weight: 70 kg (154 lb 5.2 oz) 70.1 kg (154 lb 8.7 oz) 74.1 kg (163 lb 5.8 oz)    Examination:  General exam: Appears calm and comfortable  Respiratory system: Clear to auscultation. Respiratory effort normal. Cardiovascular system: S1 & S2 heard, RRR. No JVD, murmurs, rubs, gallops or clicks. No pedal edema. Gastrointestinal system: Abdomen is nondistended, soft and nontender. No organomegaly or masses felt. Normal bowel sounds heard. Central nervous system: Alert and oriented. No focal neurological deficits. Extremities: Symmetric 5 x 5 power. Skin: No rashes, lesions or ulcers Psychiatry: Judgement and insight appear normal. Mood & affect appropriate.     Data Reviewed: I have personally reviewed following labs and imaging studies  CBC: Recent Labs  Lab 09/16/17 1355 09/17/17 0500 09/18/17 0527 09/19/17 0528 09/20/17 0517 09/21/17 0341  WBC 9.5 14.9* 18.4* 27.3* 27.3* 26.0*  NEUTROABS 2.3  --   --   --  8.2* 11.2*  HGB 9.5* 8.8* 9.1* 8.1* 7.8* 7.3*  HCT 29.1* 26.6* 28.4* 24.9* 24.3* 22.7*  MCV 94.2 94.0 93.1 93.6 93.1 94.2  PLT 68* 42* 23* 71* 48* 17*   Basic Metabolic Panel: Recent Labs  Lab 09/16/17 0454 09/17/17 0500 09/18/17 0527 09/20/17 0517 09/21/17 0341  NA 140 140 142 143 145  K 2.9* 3.1* 3.8 3.1* 4.1  CL 106 104 105 104 107  CO2 26 29 27 29 26   GLUCOSE 143* 207* 181* 193* 395*  BUN 16 15 19  27* 35*  CREATININE 0.97 0.86 0.86 0.93 1.20*  CALCIUM 7.6* 7.6* 7.8* 7.8* 7.5*  MG  --  1.9  --   --   --   PHOS  --   --   --  3.0 2.6    GFR: Estimated Creatinine Clearance: 36.3 mL/min (A) (by C-G formula based on SCr of 1.2 mg/dL (H)). Liver Function Tests: Recent Labs  Lab 09/16/17 0454 09/17/17 0500 09/18/17 0527 09/20/17 0517 09/21/17 0341  AST 40 73* 61* 44* 51*  ALT 26 46 47 40 42  ALKPHOS 48 57 59 59 57  BILITOT 0.9 1.5* 1.3* 1.5* 1.7*  PROT 6.0* 6.0* 6.4* 6.3* 5.9*  ALBUMIN 2.0* 2.2* 2.2* 2.1* 2.0*   No results for input(s): LIPASE, AMYLASE in the last 168 hours. No results for input(s): AMMONIA in the last 168 hours. Coagulation Profile: No results for input(s): INR, PROTIME in the last 168 hours. Cardiac Enzymes: No results for input(s): CKTOTAL, CKMB, CKMBINDEX, TROPONINI in the last 168 hours. BNP (last 3 results) No results for input(s): PROBNP in the last 8760 hours. HbA1C: No results for input(s): HGBA1C in the last 72 hours. CBG: Recent Labs  Lab 09/21/17 0656  GLUCAP 333*   Lipid Profile: No results for input(s): CHOL, HDL, LDLCALC, TRIG, CHOLHDL, LDLDIRECT in the last  72 hours. Thyroid Function Tests: No results for input(s): TSH, T4TOTAL, FREET4, T3FREE, THYROIDAB in the last 72 hours. Anemia Panel: No results for input(s): VITAMINB12, FOLATE, FERRITIN, TIBC, IRON, RETICCTPCT in the last 72 hours. Sepsis Labs: Recent Labs  Lab 09/17/17 0740 09/17/17 1000 09/18/17 1220 09/19/17 0850  PROCALCITON 1.60  --   --   --   LATICACIDVEN 2.0* 2.1* 2.7* 1.5    Recent Results (from the past 240 hour(s))  Culture, blood (routine x 2)     Status: None   Collection Time: 09/12/17 12:50 PM  Result Value Ref Range Status   Specimen Description   Final    BLOOD RIGHT ANTECUBITAL Performed at [facility], [place] [address] [place], [place] [address]    Special Requests   Final    BOTTLES DRAWN AEROBIC ONLY Blood Culture adequate volume Performed at [facility] [address] [place], [place] [address]    Culture   Final    NO GROWTH 5  DAYS Performed at [facility], [place] [address] [place], [place] [address]    Report Status 09/17/2017 FINAL  Final  Culture, blood (routine x 2)     Status: None   Collection Time: 09/12/17 12:50 PM  Result Value Ref Range Status   Specimen Description   Final    BLOOD RIGHT HAND Performed at [facility] [address], [place], [place] [address]    Special Requests   Final    BOTTLES DRAWN AEROBIC AND ANAEROBIC Blood Culture adequate volume Performed at [facility] [address], [place], [place] [address]    Culture   Final    NO GROWTH 5 DAYS Performed at [facility], [place] [address] [place], [place] [address]    Report Status 09/17/2017 FINAL  Final  MRSA PCR Screening     Status: None   Collection Time: 09/12/17  3:38 PM  Result Value Ref Range Status   MRSA by PCR NEGATIVE NEGATIVE Final    Comment:        The GeneXpert MRSA Assay (FDA approved for NASAL specimens only), is one component of a comprehensive MRSA colonization surveillance program. It is not intended to diagnose MRSA infection nor to guide or monitor treatment for MRSA infections. Performed at [facility], [place] [address], [place], [place] [address]   Culture, blood (routine x 2)     Status: None (Preliminary result)   Collection Time: 09/17/17  7:30 AM  Result Value Ref Range Status   Specimen Description   Final    BLOOD LEFT HAND Performed at [facility] [address], [place], [place] [address]    Special Requests   Final    BOTTLES DRAWN AEROBIC ONLY Blood Culture adequate volume Performed at [facility] [address], [place], [place] [address]    Culture   Final    NO GROWTH 3 DAYS Performed at [facility], [place] [address], [place], [place] [address]    Report Status PENDING  Incomplete  Culture, blood (routine x 2)     Status: None (Preliminary result)    Collection Time: 09/17/17  7:38 AM  Result Value Ref Range Status   Specimen Description   Final    BLOOD LEFT HAND Performed at [facility] [address] [place], [place] [address]    Special Requests   Final    BOTTLES  DRAWN AEROBIC ONLY Blood Culture adequate volume Performed at [facility] [address], [place], [place] [address]    Culture   Final    NO GROWTH 3 DAYS Performed at [facility], [place] [address], [place], [place] [address]    Report Status PENDING  Incomplete         Radiology Studies: Ct Chest Wo Contrast  Result Date: 09/19/2017 CLINICAL DATA:  Generalized weakness and fatigue. Fever, anemia. AML. EXAM: CT CHEST WITHOUT CONTRAST TECHNIQUE: Multidetector CT imaging of the chest was performed following the standard protocol without IV contrast. COMPARISON:  None. FINDINGS: Cardiovascular: Right PICC tip terminates junction at the SVC. Atherosclerotic calcification of the arterial vasculature, including coronary arteries. Pulmonary arteries and heart are enlarged. No pericardial effusion. Mediastinum/Nodes: Thyroid is heterogeneous. Mediastinal lymph nodes measure up to 10 mm in the low right paratracheal station. Hilar regions are difficult to evaluate without IV contrast. Subpectoral and axillary lymph nodes are not enlarged by CT size criteria. Esophagus is grossly unremarkable. Lungs/Pleura: Image quality is rather degraded by respiratory motion. There is perihilar peribronchovascular ground-glass and consolidation with peripheral sparing. Septal thickening is not an overwhelming feature. No pleural fluid. Airway is unremarkable. Upper Abdomen: Visualized portions of the liver, adrenal glands, kidneys, spleen, pancreas, stomach and bowel are grossly unremarkable. Musculoskeletal: Degenerative changes in the spine. No worrisome lytic or sclerotic lesions. IMPRESSION: 1. Perihilar peribronchovascular ground-glass and  consolidation with peripheral sparing. Findings may be due to leukostasis related to AML, pulmonary hemorrhage or pulmonary edema. 2. Borderline enlarged mediastinal lymph nodes. 3. Aortic atherosclerosis (ICD10-170.0). Coronary artery calcification. 4. Enlarged pulmonary arteries, indicative of pulmonary arterial hypertension. Electronically Signed   By: Lorin Picket M.D.   On: 09/19/2017 16:56   Dg Chest Port 1 View  Result Date: 09/21/2017 CLINICAL DATA:  Shortness of breath and fever. Acute myeloid leukemia, atrial fibrillation. EXAM: PORTABLE CHEST 1 VIEW COMPARISON:  Chest x-ray of September 17, 2017 and chest CT scan of September 19, 2017. FINDINGS: The lungs are adequately inflated. The new confluent airspace opacities are present greatest on the left. There is no pleural effusion or pneumothorax. The heart is enlarged. The pulmonary vascularity is indistinct but definite cephalization is not observed. There is calcification in the wall of the thoracic aorta. The right-sided PICC line tip projects over the midportion of the SVC. IMPRESSION: Interval development of airspace opacities greatest on the left most compatible with pneumonia. Underlying low-grade CHF with stable cardiomegaly. Thoracic aortic atherosclerosis. Electronically Signed   By: David  Lewis M.D.   On: 09/21/2017 08:20   Dg Shoulder Right Port  Result Date: 09/20/2017 CLINICAL DATA:  Right shoulder pain with difficulty raising the arm. No known injury. EXAM: PORTABLE RIGHT SHOULDER COMPARISON:  Limited views of the right shoulder from a scout radiograph of the CT scan of the chest of September 19, 2017 FINDINGS: The bones are subjectively adequately mineralized. The glenohumeral joint space is grossly normal. There is moderate narrowing of the AC joint. The subacromial subdeltoid space is normal. There is a small amount of calcification in the region of the insertion of the rotator cuff on the greater tuberosity of the humerus. The observed  portions of the right lung exhibit increased interstitial and patchy airspace opacities. IMPRESSION: Degenerative change of the right shoulder centered on the [facility] joint. No acute bony abnormality. Electronically Signed   By: David  Lewis M.D.   On: 09/20/2017 09:04        Scheduled  Meds: . sodium chloride   Intravenous Once  . sodium chloride   Intravenous Once  . antiseptic oral rinse  15 mL Mouth Rinse Q4H  . Chlorhexidine Gluconate Cloth  6 each Topical Daily  . collagenase   Topical Daily  . diltiazem  180 mg Oral Q12H  . feeding supplement (ENSURE ENLIVE)  237 mL Oral BID BM  . furosemide  20 mg Intravenous Once  . ketorolac  1 drop Both Eyes QID  . lidocaine  1 patch Transdermal QAC breakfast  . mouth rinse  15 mL Mouth Rinse BID  . multivitamin with minerals  1 tablet Oral Daily  . nutrition supplement (JUVEN)  1 packet Oral BID BM  . polyethylene glycol  17 g Oral BID  . prednisoLONE acetate  1 drop Both Eyes QID  . sodium chloride flush  10-40 mL Intracatheter Q12H   Continuous Infusions: . sodium chloride       LOS: 15 days     Georgette Shell, MD [facility]   If 7PM-7AM, please contact night-coverage [email or web address] Password [email or web address] 09/21/2017, 11:12 AM

## 2017-09-21 NOTE — Progress Notes (Signed)
Leah Lewis is now out of the ICU.  She is up on 6 E.  She is having some issues with tachypnea.  She does not appear to be in any distress.  Her O2 saturation earlier was 89%.  She had 2 L nasal cannula placed and the oxygen saturation up to 95%.  I am not sure she wants to wear the supplemental oxygen.  She has not yet started the venetoclax.  Apparently, there is no one in her family who is willing to sign for it.  I am absolutely clueless as to why they are not willing to sign for this.  I will have to talk to them.  Her white cell count I think has peaked.  It is now 26,000.  Her LDH is trending downward at 619.  Her calcium is 7.5.  Albumin is 2.0.  Her glucose is 395.  Her platelet count is 17,000.  Her hemoglobin is 7.3.  We will had to transfuse red cells and platelets.  I have to believe that the Dacogen is starting to work.  I am not sure why she seems to be a little bit lethargic.  She really cannot move her right arm.  I would not think that she has had any type of cerebrovascular event.  Again, having the Venetoclax on board would really help [place] out.  Again I will have to talk to her family about this.  If they do not want to give her venetoclax, then I will have to really talk to them about the end-of-life issues and address CODE STATUS.  Studies have definitely shown that the combination of the Dacogen with venetoclax is quite beneficial.  I know that this is a somewhat tedious situation.  We are doing our best to try to get Leah Lewis through this initial therapy.  I just want to try to help her quality of life if possible.  [name], MD  Deuteronomy 31:6

## 2017-09-21 NOTE — Care Management Important Message (Signed)
Important Message  Patient Details  Name: Leah Lewis MRN: [id number] Date of Birth: 1934-05-26   Medicare Important Message Given:  Yes    Kerin Salen 09/21/2017, 10:38 AMImportant Message  Patient Details  Name: Leah Lewis MRN: [id number] Date of Birth: 1934-09-27   Medicare Important Message Given:  Yes    Kerin Salen 09/21/2017, 10:38 AM

## 2017-09-21 NOTE — Progress Notes (Signed)
Per [facility] rep, pt active with them for HHRN/PT/SW/Aide. Will need resumption orders at discharge. Marney Doctor RN,BSN,NCM [phone]

## 2017-09-21 NOTE — Telephone Encounter (Signed)
Oral Chemotherapy Pharmacist Encounter   Mr. Leah Lewis returned my phone call. He had various question about treatment options that are best answered by an MD. Provided Leah with the number number for the [facility]. He knows to give me a call if they want to proceed with venetoclax.   Darl Pikes, PharmD, BCPS Hematology/Oncology Clinical Pharmacist [facility] [phone]  09/21/2017 12:15 PM

## 2017-09-21 NOTE — Progress Notes (Signed)
First unit of PRBC's complete temp 100.0 from 98.7 DR Rodena Piety paged to make aware of increased temp. MD suggestion to give prn tylenol dose.

## 2017-09-21 NOTE — Progress Notes (Signed)
Palliative:  I met with Joycelyn Schmid- patient's daughter and who has been deemed as the primary decision maker at this point as she is the only one really willing and available to make decisions.   I answered Margaret's questions related to artificial feeding, overall prognosis and illness trajectory. I reviewed with Joycelyn Schmid the information from Dr. Antonieta Pert notes- that patient's counts appear to be improving- and would perhaps improve more with the addition of the oral chemotherapy.  However, her overall condition does not appear to be improving. She had some difficulty breathing, she is not eating. She has not been out of bed since admission. Her cognitive status is not improving- she is communicating very little.   Margaret for now decides to continue aggressive medical care- including a trial feeding tube. She agrees to limited time trial of 3 days to see if this will help improve patient's condition.   We also discussed that if she wishes aggressive care then the venetoclax needs to be picked up. Joycelyn Schmid agreed to get this.  Code status was discussed. We discussed that if patient decompensated to the point where she no pulse and was not breathing- CPR and intubation with mechanical ventilation would likely not bring her back to a more functional status than her current state. I did make recommendation for DNR status in the event patient experienced pulseless state without respiration with otherwise full scope care. Joycelyn Schmid wishes to keep patient full code for now.   [name], AGNP-C Palliative Medicine  Please call Palliative Medicine team phone with any questions [phone]. For individual providers please see AMION. Time in: 1610 Time out: 1700 Total time: 50 mins Prolonged services billed: Yes

## 2017-09-21 NOTE — Progress Notes (Signed)
Inpatient Diabetes Program Recommendations  AACE/ADA: New Consensus Statement on Inpatient Glycemic Control (2015)  Target Ranges:  Prepandial:   less than 140 mg/dL      Peak postprandial:   less than 180 mg/dL (1-2 hours)      Critically ill patients:  140 - 180 mg/dL   Lab Results  Component Value Date   GLUCAP 333 (H) 09/21/2017   HGBA1C (H) 04/03/2007    6.2 (NOTE)   The ADA recommends the following therapeutic goals for glycemic   control related to Hgb A1C measurement:   Goal of Therapy:   < 7.0% Hgb A1C   Action Suggested:  > 8.0% Hgb A1C   Ref:  Diabetes Care, 22, Suppl. 1, 1999    Review of Glycemic Control  Diabetes history: None Outpatient Diabetes medications: N/A Current orders for Inpatient glycemic control: None  6/19 - 395, 333 mg/dL Consider adding low-dose basal and Novolog correction  Inpatient Diabetes Program Recommendations:     Lantus 5 units QHS Novolog 0-9 units tidwc  Will continue to follow.   Thank you. Lorenda Peck, RD, LDN, CDE Inpatient Diabetes Coordinator [phone]

## 2017-09-21 NOTE — Progress Notes (Signed)
Unable to draw labs from PICC Iv team and lab to be made aware

## 2017-09-21 NOTE — Progress Notes (Signed)
Critical value - platelets 17 trh notified

## 2017-09-21 NOTE — Telephone Encounter (Signed)
Oral Chemotherapy Pharmacist Encounter   Yesterday afternoon 09/20/17 I attempted to reach [name], the phone number in Epic for her is not in service. Then called Isaiah Martinique and left him a VM.   Mr. Martinique returned my call after I was gone for the day. This morning I again called [name] and left another VM.   Yesterday, I spoke with Roselyn Reef Dr. Antonieta Pert RN and informed her of all of the information on the contact issues with the family. The family may need a call directly from Dr. Marin Olp in order for the venetoclax to proceed. Will await further instruction.  Darl Pikes, PharmD, BCPS Hematology/Oncology Clinical Pharmacist [facility] [phone]  09/21/2017 10:44 AM

## 2017-09-22 ENCOUNTER — Encounter (HOSPITAL_COMMUNITY): Payer: Self-pay | Admitting: Internal Medicine

## 2017-09-22 DIAGNOSIS — B370 Candidal stomatitis: Secondary | ICD-10-CM

## 2017-09-22 HISTORY — DX: Candidal stomatitis: B37.0

## 2017-09-22 LAB — TYPE AND SCREEN
ABO/RH(D): A POS
ANTIBODY SCREEN: POSITIVE
DAT, IGG: POSITIVE
Unit division: 0
Unit division: 0

## 2017-09-22 LAB — COMPREHENSIVE METABOLIC PANEL
ALK PHOS: 56 U/L (ref 38–126)
ALT: 41 U/L (ref 14–54)
ANION GAP: 11 (ref 5–15)
AST: 50 U/L — ABNORMAL HIGH (ref 15–41)
Albumin: 2.1 g/dL — ABNORMAL LOW (ref 3.5–5.0)
BUN: 35 mg/dL — ABNORMAL HIGH (ref 6–20)
CALCIUM: 7.3 mg/dL — AB (ref 8.9–10.3)
CO2: 28 mmol/L (ref 22–32)
Chloride: 102 mmol/L (ref 101–111)
Creatinine, Ser: 1.01 mg/dL — ABNORMAL HIGH (ref 0.44–1.00)
GFR, EST AFRICAN AMERICAN: 59 mL/min — AB (ref >60)
GFR, EST NON AFRICAN AMERICAN: 51 mL/min — AB (ref >60)
Glucose, Bld: 242 mg/dL — ABNORMAL HIGH (ref 65–99)
Potassium: 3.6 mmol/L (ref 3.5–5.1)
SODIUM: 141 mmol/L (ref 135–145)
TOTAL PROTEIN: 6.2 g/dL — AB (ref 6.5–8.1)
Total Bilirubin: 1.7 mg/dL — ABNORMAL HIGH (ref 0.3–1.2)

## 2017-09-22 LAB — CULTURE, BLOOD (ROUTINE X 2)
CULTURE: NO GROWTH
Culture: NO GROWTH
Special Requests: ADEQUATE
Special Requests: ADEQUATE

## 2017-09-22 LAB — BPAM RBC
BLOOD PRODUCT EXPIRATION DATE: 201907102359
Blood Product Expiration Date: 201907152359
ISSUE DATE / TIME: 201906191551
ISSUE DATE / TIME: 201906192358
UNIT TYPE AND RH: 6200
Unit Type and Rh: 600

## 2017-09-22 LAB — CBC WITH DIFFERENTIAL/PLATELET
Basophils Absolute: 0 10*3/uL (ref 0.0–0.1)
Basophils Relative: 0 %
Blasts: 3 %
EOS ABS: 0 10*3/uL (ref 0.0–0.7)
Eosinophils Relative: 0 %
HCT: 30.3 % — ABNORMAL LOW (ref 36.0–46.0)
Hemoglobin: 10 g/dL — ABNORMAL LOW (ref 12.0–15.0)
LYMPHS ABS: 3.8 10*3/uL (ref 0.7–4.0)
Lymphocytes Relative: 32 %
MCH: 30.3 pg (ref 26.0–34.0)
MCHC: 33 g/dL (ref 30.0–36.0)
MCV: 91.8 fL (ref 78.0–100.0)
MONO ABS: 5.8 10*3/uL — AB (ref 0.1–1.0)
MONOS PCT: 48 %
NEUTROS ABS: 2 10*3/uL (ref 1.7–7.7)
Neutrophils Relative %: 17 %
PLATELETS: 23 10*3/uL — AB (ref 150–400)
RBC: 3.3 MIL/uL — AB (ref 3.87–5.11)
RDW: 18.6 % — ABNORMAL HIGH (ref 11.5–15.5)
WBC: 12 10*3/uL — AB (ref 4.0–10.5)

## 2017-09-22 LAB — PHOSPHORUS: PHOSPHORUS: 2.8 mg/dL (ref 2.5–4.6)

## 2017-09-22 LAB — OCCULT BLOOD X 1 CARD TO LAB, STOOL: Fecal Occult Bld: NEGATIVE

## 2017-09-22 LAB — GLUCOSE, CAPILLARY: Glucose-Capillary: 216 mg/dL — ABNORMAL HIGH (ref 65–99)

## 2017-09-22 LAB — URIC ACID: URIC ACID, SERUM: 3.6 mg/dL (ref 2.3–6.6)

## 2017-09-22 LAB — LACTATE DEHYDROGENASE: LDH: 707 U/L — ABNORMAL HIGH (ref 98–192)

## 2017-09-22 MED ORDER — ALTEPLASE 2 MG IJ SOLR
2.0000 mg | Freq: Once | INTRAMUSCULAR | Status: AC
  Administered 2017-09-22: 2 mg
  Filled 2017-09-22: qty 2

## 2017-09-22 MED ORDER — NYSTATIN 500000 UNITS PO TABS
500000.0000 [IU] | ORAL_TABLET | Freq: Three times a day (TID) | ORAL | Status: DC
  Filled 2017-09-22: qty 1

## 2017-09-22 MED ORDER — FLUCONAZOLE 100MG IVPB
100.0000 mg | INTRAVENOUS | Status: DC
  Administered 2017-09-22 – 2017-09-25 (×4): 100 mg via INTRAVENOUS
  Filled 2017-09-22 (×5): qty 50

## 2017-09-22 MED ORDER — DILTIAZEM HCL ER 60 MG PO CP12
60.0000 mg | ORAL_CAPSULE | Freq: Two times a day (BID) | ORAL | Status: DC
  Administered 2017-09-22 – 2017-09-26 (×7): 60 mg via ORAL
  Filled 2017-09-22 (×9): qty 1

## 2017-09-22 MED ORDER — NYSTATIN 100000 UNIT/ML MT SUSP
5.0000 mL | Freq: Three times a day (TID) | OROMUCOSAL | Status: DC
  Administered 2017-09-22 – 2017-10-11 (×41): 500000 [IU] via ORAL
  Filled 2017-09-22 (×42): qty 5

## 2017-09-22 MED ORDER — ENSURE ENLIVE PO LIQD
237.0000 mL | Freq: Three times a day (TID) | ORAL | Status: DC
  Administered 2017-09-22 – 2017-10-11 (×24): 237 mL via ORAL

## 2017-09-22 MED ORDER — SODIUM CHLORIDE 0.9 % IV SOLN
INTRAVENOUS | Status: DC
  Administered 2017-09-22 – 2017-09-24 (×3): via INTRAVENOUS

## 2017-09-22 NOTE — Progress Notes (Signed)
Physical Therapy Treatment Patient Details Name: Zalaya J Martinique MRN: [id number] DOB: 11-10-1934 Today's Date: 09/22/2017    History of Present Illness 83 y.o. female with medical history significant of hypertension, prior stroke hx of left DVT.  Patient presented to the emergency department complaining of generalized weakness and fatigue. Dx of fever,anemia.  New diagnosis of AML-oncology following    PT Comments    The patient continues to be  Very debilitated compared to  Just after admission. Patient has difficulty engaging in therapy, no balance control noted today. Continue PT efforts   Follow Up Recommendations  SNF     Equipment Recommendations  None recommended by PT    Recommendations for Other Services       Precautions / Restrictions Precautions Precautions: Other (comment);Fall    Mobility  Bed Mobility Overal bed mobility: Needs Assistance Bed Mobility: Supine to Sit;Sit to Supine     Supine to sit: Total assist;+2 for physical assistance Sit to supine: Total assist;+2 for physical assistance   General bed mobility comments: total A +2 for all mobility:  rolling, supine<> sit  Transfers                    Ambulation/Gait                 Stairs             Wheelchair Mobility    Modified Rankin (Stroke Patients Only)       Balance Overall balance assessment: Needs assistance   Sitting balance-Leahy Scale: Zero Sitting balance - Comments: patient  did not consistently place UE's for support, kept moving the arms, listing backward to right. no responses                                    Cognition Arousal/Alertness: Awake/alert   Overall Cognitive Status: Impaired/Different from baseline Area of Impairment: Following commands                               General Comments: very slow to respond; less awareness of R UE when asked to move, intermittent following commands      Exercises      General Comments        Pertinent Vitals/Pain Faces Pain Scale: Hurts even more Pain Location: RUE and back Pain Descriptors / Indicators: Discomfort;Grimacing;Guarding Pain Intervention(s): Monitored during session;Limited activity within patient's tolerance    Home Living                      Prior Function            PT Goals (current goals can now be found in the care plan section) Progress towards PT goals: Not progressing toward goals - comment(significant decline since admission)    Frequency    Min 2X/week      PT Plan Current plan remains appropriate    Co-evaluation PT/OT/SLP Co-Evaluation/Treatment: Yes Reason for Co-Treatment: Complexity of the patient's impairments (multi-system involvement) PT goals addressed during session: Mobility/safety with mobility OT goals addressed during session: ADL's and self-care      AM-PAC PT "6 Clicks" Daily Activity  Outcome Measure  Difficulty turning over in bed (including adjusting bedclothes, sheets and blankets)?: Unable Difficulty moving from lying on back to sitting on the side of the bed? : Unable Difficulty sitting  down on and standing up from a chair with arms (e.g., wheelchair, bedside commode, etc,.)?: Unable Help needed moving to and from a bed to chair (including a wheelchair)?: Total Help needed walking in hospital room?: Total Help needed climbing 3-5 steps with a railing? : Total 6 Click Score: 6    End of Session   Activity Tolerance: Patient limited by lethargy Patient left: in bed;with call bell/phone within reach;with bed alarm set Nurse Communication: Mobility status PT Visit Diagnosis: Difficulty in walking, not elsewhere classified (R26.2)     Time: 1460-4799 PT Time Calculation (min) (ACUTE ONLY): 18 min  Charges:  $Therapeutic Activity: 8-22 mins                    G CodesTresa Endo PT [phone]    Claretha Cooper 09/22/2017, 3:37 PM

## 2017-09-22 NOTE — Progress Notes (Signed)
Occupational Therapy Treatment Patient Details Name: Leah Lewis MRN: [id number] DOB: [birth year] Today's Date: 09/22/2017    History of present illness 83 y.o. female with medical history significant of hypertension, prior stroke hx of left DVT.  Patient presented to the emergency department complaining of generalized weakness and fatigue. Dx of fever,anemia.  New diagnosis of AML-oncology following   OT comments  Pt agreeable to therapy but profoundly weak.  Unable to stabilize trunk in sitting. +2 needed for all mobility and LB adls.  Pt fearful of movement  Follow Up Recommendations  SNF    Equipment Recommendations  None recommended by OT    Recommendations for Other Services      Precautions / Restrictions Restrictions Weight Bearing Restrictions: No       Mobility Bed Mobility               General bed mobility comments: total A +2 for all mobility:  rolling, supine<> sit  Transfers                 General transfer comment: unable    Balance Overall balance assessment: Needs assistance   Sitting balance-Leahy Scale: Zero                                     ADL either performed or assessed with clinical judgement   ADL                                         General ADL Comments: total A for adls except grooming max A only for face     Vision       Perception     Praxis      Cognition Arousal/Alertness: Awake/alert Behavior During Therapy: WFL for tasks assessed/performed Overall Cognitive Status: Impaired/Different from baseline                                 General Comments: very slow to respond; less awareness of R UE when asked to move        Exercises     Shoulder Instructions       General Comments      Pertinent Vitals/ Pain       Pain Assessment: Faces Faces Pain Scale: Hurts even more Pain Location: RUE and back Pain Intervention(s): Limited activity within  patient's tolerance;Monitored during session;Repositioned  Home Living                                          Prior Functioning/Environment              Frequency  Min 2X/week        Progress Toward Goals  OT Goals(current goals can now be found in the care plan section)  Progress towards OT goals: (goals set today)  ADL Goals Pt Will Perform Eating: with max assist;sitting;bed level Pt Will Perform Grooming: with max assist;bed level(2 activities) Additional ADL Goal #1: pt will sit unsupported eob x 2 minutes with mod A in preparation for transfers and she will roll to R with max A for adls  Plan      Co-evaluation  PT/OT/SLP Co-Evaluation/Treatment: Yes Reason for Co-Treatment: Complexity of the patient's impairments (multi-system involvement) PT goals addressed during session: Mobility/safety with mobility OT goals addressed during session: Strengthening/ROM      AM-PAC PT "6 Clicks" Daily Activity     Outcome Measure   Help from another person eating meals?: Total Help from another person taking care of personal grooming?: Total Help from another person toileting, which includes using toliet, bedpan, or urinal?: Total Help from another person bathing (including washing, rinsing, drying)?: Total Help from another person to put on and taking off regular upper body clothing?: Total Help from another person to put on and taking off regular lower body clothing?: Total 6 Click Score: 6    End of Session    OT Visit Diagnosis: Unsteadiness on feet (R26.81);Muscle weakness (generalized) (M62.81);Other abnormalities of gait and mobility (R26.89)   Activity Tolerance Patient limited by pain;Patient limited by fatigue   Patient Left in bed;with call bell/phone within reach;with bed alarm set   Nurse Communication          Time: [phone] OT Time Calculation (min): 19 min  Charges: OT General Charges $OT Visit: 1 Visit OT  Treatments $Therapeutic Activity: (no charge; co tx)  [name], OTR/L [phone] 09/22/2017   Leah Lewis 09/22/2017, 9:33 AM

## 2017-09-22 NOTE — Progress Notes (Signed)
Daily Progress Note   Patient Name: Leah Lewis       Date: 09/22/2017 DOB: Oct 23, 1934  Age: 83 y.o. MRN#: [id number] Attending Physician: Georgette Shell, MD Primary Care Physician: Wenda Low, MD Admit Date: 09/19/2017  Reason for Consultation/Follow-up: Establishing goals of care  Subjective: Patient more awake and alert this morning s/p transfusion of PRBC. Per nursing she ate 40% of breakfast. Leukocytosis is improving. This is great improvement. Note per Dr. Antonieta Pert note- some thrush- this could also be affecting po intake. No family at bedside. Noted difficulty contacting primary contact and decision making regarding obtaining oral chemotherapy.  ROS  Length of Stay: 16  Current Medications: Scheduled Meds:  . antiseptic oral rinse  15 mL Mouth Rinse Q4H  . Chlorhexidine Gluconate Cloth  6 each Topical Daily  . collagenase   Topical Daily  . diltiazem  180 mg Oral Q12H  . feeding supplement (ENSURE ENLIVE)  237 mL Oral BID BM  . ketorolac  1 drop Both Eyes QID  . lidocaine  1 patch Transdermal QAC breakfast  . mouth rinse  15 mL Mouth Rinse BID  . multivitamin with minerals  1 tablet Oral Daily  . nutrition supplement (JUVEN)  1 packet Oral BID BM  . polyethylene glycol  17 g Oral BID  . prednisoLONE acetate  1 drop Both Eyes QID  . sodium chloride flush  10-40 mL Intracatheter Q12H    Continuous Infusions: . sodium chloride    . sodium chloride 50 mL/hr at 09/22/17 0839  . fluconazole (DIFLUCAN) IV      PRN Meds: acetaminophen **OR** acetaminophen, alteplase, heparin lock flush, heparin lock flush, HYDROcodone-acetaminophen, ibuprofen, labetalol, ondansetron (ZOFRAN) IV, sodium chloride flush, sodium chloride flush  Physical Exam          Vital Signs:  BP (!) 109/58 (BP Location: Left Arm)   Pulse 72   Temp 99.1 F (37.3 C) (Oral)   Resp (!) 24   Ht 5\' 4"  (1.626 m)   Wt 69.3 kg (152 lb 12.5 oz)   SpO2 95%   BMI 26.22 kg/m  SpO2: SpO2: 95 % O2 Device: O2 Device: Nasal Cannula O2 Flow Rate: O2 Flow Rate (L/min): 2 L/min  Intake/output summary:   Intake/Output Summary (Last 24 hours) at 09/22/2017 1111 Last data filed at 09/22/2017  7972 Gross per 24 hour  Intake 1913.92 ml  Output 1900 ml  Net 13.92 ml   LBM: Last BM Date: (pt unable to recall) Baseline Weight: Weight: 68.9 kg (152 lb) Most recent weight: Weight: 69.3 kg (152 lb 12.5 oz)       Palliative Assessment/Data: PPS: 20%    Flowsheet Rows     Most Recent Value  Intake Tab  Referral Department  Hospitalist  Unit at Time of Referral  Med/Surg Unit  Palliative Care Primary Diagnosis  Neurology  Date Notified  09/15/17  Palliative Care Type  New Palliative care  Reason for referral  Clarify Goals of Care  Date of Admission  09/06/2017  Date first seen by Palliative Care  09/16/17  # of days Palliative referral response time  1 Day(s)  # of days IP prior to Palliative referral  10  Clinical Assessment  Psychosocial & Spiritual Assessment  Palliative Care Outcomes      Patient Active Problem List   Diagnosis Date Noted  . Fever   . Pancytopenia ([place])   . Atrial fibrillation with RVR ([place])   . AML (acute myeloblastic leukemia) ([place])   . Advance care planning   . Palliative care by specialist   . AML (acute myeloid leukemia) ([place]) 09/12/2017  . Goals of care, counseling/discussion 09/12/2017  . Pressure injury of skin 09/06/2017  . DVT (deep venous thrombosis), left 03/02/2015  . DVT (deep venous thrombosis), unspecified laterality 03/02/2015  . GERD (gastroesophageal reflux disease) 03/02/2015  . Chest pain 04/20/2011  . Cervical radiculopathy 04/20/2011  . Hypertension   . Stroke ([place])   . High cholesterol     Palliative Care Assessment & Plan    Patient Profile: 83 y.o. female  with past medical history of osteoporosis, hyperlipidemia, occlusive lower left extremity DVT (on Xarelto), CVA, admitted on 09/09/2017 with worsening weakness and fatigue. She was scheduled to be seen by oncology same day of admission but missed the appointment due to weakness. Workup this admission has revealed AML and she has been started on treatment with Dacogen. Palliative medicine consulted for [place].      Assessment/Recommendations/Plan   PMT will attempt to contact patient's daughter Joycelyn Schmid who has agreed to be patient's primary decision maker for now  Goals of Care and Additional Recommendations:  Limitations on Scope of Treatment: Full Scope Treatment  Code Status:  Full code  Prognosis:   Unable to determine  Discharge Planning:  To Be Determined  Care plan was discussed with patient's RN  Thank you for allowing the Palliative Medicine Team to assist in the care of this patient.   Time In: 1100 Time Out: 1120 Total Time 20 mins Prolonged Time Billed no      Greater than 50%  of this time was spent counseling and coordinating care related to the above assessment and plan.  [name], AGNP-C Palliative Medicine   Please contact Palliative Medicine Team phone at [phone] for questions and concerns.

## 2017-09-22 NOTE — Progress Notes (Signed)
Phone note: Non face to face encounter  I called and spoke again with Joycelyn Schmid-  We discussed the issue of not starting [place] on the oral chemotherapy yet. She states that she wants patient to be eating or have artificial nutrition started before eating. After discussing this with Dr. Zigmund Daniel and evaluating patient I discussed with Joycelyn Schmid that patient appears to be eating more on her own now and there is no indication for artificial feeding at this time.   Joycelyn Schmid continued to ask questions about patient's prognosis at her diagnosis and the effects of chemotherapy on her overall functional status. We agreed that her questions are best addressed by Dr. Marin Olp himself. I contacted Dr. Antonieta Pert office and gave them Margaret's information with a request to contact her.   Recommendations:   1. No artificial feeding 2. Continue full scope care 3. PMT will continue to keep lines of communication open with Joycelyn Schmid and assist with [place]. Joycelyn Schmid wishes to continue full code status for now.   [name], AGNP-C Palliative Medicine  Please call Palliative Medicine team phone with any questions [phone]. For individual providers please see AMION.  Total time: 45 mins

## 2017-09-22 NOTE — Progress Notes (Signed)
Ms. Martinique might be a little bit more alert this morning.  She did receive 2 units of packed red blood cells yesterday.  She got some platelets.  There is no blood counts back yet today.  Her family still has not yet agreed to the venetoclax.  I am trying to get hold of the family met her that is the "spokesman."  I have no clue as to what the issue is.  I think the Venetoclax is incredibly important and should be very well tolerated.  She has finished the 5 days of Dacogen.  I think she is done well.  Of note, her temperature curve is slowly coming down.  Hopefully, this is another indicator that her leukemia is responding.  I am not sure how much she is really eating.  I know that nutritional therapy is seen her.  It looks like she may have little bit of thrush in the oral cavity.  Her oral mucosa is incredibly dry.  We will see about getting her on some IV fluid.  I will start some Diflucan.  She has a atrial fibrillation.  She really is not a candidate for anticoagulation because of her performance status and her thrombocytopenia.  She still has decreased movement of the right arm.  There is no diarrhea.  Her urine does look a little concentrated.  She was on some IV antibiotics.  I think she was on Merrem.  Her cultures have all been negative.  This is been stopped.  He will be very fasting to see what her counts are today.  I really have to talk to the family about the venetoclax.  Again I am not sure what the hesitation is with using this.  I think the study is incredibly clear as to the benefit that phonetic looks has to been added to the Dacogen.  Her one daughter was in the room.  We had a great prayer session.  [name], MD  Psalm 91:1-2

## 2017-09-22 NOTE — Progress Notes (Addendum)
PROGRESS NOTE    Leah Lewis  DDU:[id number] DOB: [birth year] DOA: 09/10/2017 PCP: Wenda Low, MD   Brief Narrative:83 year old female with past medical history of osteoporosis, hyperlipidemia, DVT on Xarelto presented to the emergency department complaining of generalized weakness. 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 During hospital stay patient spiking fevers, being treated with antibiotic but obvious source of infection. Patient developed A. fib with RVR, cardiology consulted and treated with Cardizem. Thrombocytopenia worsened and she was transfused 1 unit of platelets. Per oncology poor prognosis and palliative care has been consulted. Patient has been started on chemotherapy. Heart rate improving.     Assessment & Plan:   Active Problems:   Pressure injury of skin   AML (acute myeloid leukemia) (HCC)   Goals of care, counseling/discussion   AML (acute myeloblastic leukemia) ([place])   Advance care planning   Palliative care by specialist   Fever   Pancytopenia ([place])   Atrial fibrillation with RVR (HCC) AML  Causing generalized weakness and thrombocytopenia, leukocytosis improved to 12.0 today. Oncology following and started on chemotherapy 6/13.Planning to start Venetoclax pending family approval and consent.  Palliative care spoke with Joycelyn Schmid today andwas wanting a feeding tube placed for nutrition.  I will hold off on on feeding tube as of now as she is more awake  and is eating better.  And moreover with her low platelets chances of bleeding is higher. PT recommending home health PT and 24-hour assistance,continuefall precautions Encourage oral hydration and nutrition. Palliative care following.  Thrombocytopenia/anemia status post blood transfusion and platelet transfusion.  Hemoglobin increased to 10. Secondary to AML No signs of overt bleeding, continue to monitor CBC  New diagnosis A.  fib with RVR-improved Cardiology consulted andinitially treated withCardizem drip, she has been transitioned to Cardizem p.o. Rhythm remains in A. fib with goodcontrol. Continue Cardizem 180 mg twice daily Not on anticoagulation due to thrombocytopenia and anemia.  HTN Blood pressure stable Continue Cardizem and labetalol as needed Continue to monitor  Hypokalemia Improved Replete as needed  Persistent fever- improved No clear source of active infection at this time, patient was treated with IV vancomycin and IV cefepime, subsequently switched to meropenem on 6/14. There is some infiltrates on chest x-ray from 6/15.CT chest 6/17 shows peri-Hilliary peribronchiolar groundglass and consolidation with peripheral sparing findings may be related to leukostasis from AML, pulmonary hemorrhage or pulmonary edema.  Procalcitonin was elevated.MRI was negative for osteomyelitis, she has received 11 days total of antibiotics. Blood cultures remain negative. Will discontinue antibiotics and continue to monitor. Stat chest x-ray ordered this morning to some consolidation since patient received 11 days of antibiotics and will not restart her on antibiotics at this time but place her on oxygen as needed.  History of DVT Previously on Xarelto, on hold due to thrombocytopenia  Unstageable sacral decubitus ulcer Seems to be stable, patient on antibiotics for possible infectious process however MRI with no signs of osteomyelitis. Wound care recommendations appreciated  THRUSH add nystatin to diflucan         DVT prophylaxis: scd Code Status: full Family Communication:no family in room. Disposition Plan: tbd Consultants: Oncology, palliative care  Procedures:none Antimicrobials: None  Subjective: Resting in bed oriented to hospital answer questions appropriately in no acute distress.   Objective: Vitals:   09/22/17 0003 09/22/17 0049 09/22/17 0413 09/22/17 1040  BP:  109/64 101/61 (!) 105/55 (!) 109/58  Pulse: 90 73 70 72  Resp: Marland Kitchen)  32 (!) 28 (!) 24   Temp: 98.1 F (36.7 C) 99.2 F (37.3 C) 99.1 F (37.3 C)   TempSrc: Oral Oral Oral   SpO2: 97% 100% 95%   Weight:   69.3 kg (152 lb 12.5 oz)   Height:        Intake/Output Summary (Last 24 hours) at 09/22/2017 1057 Last data filed at 09/22/2017 0857 Gross per 24 hour  Intake 1913.92 ml  Output 1900 ml  Net 13.92 ml   Filed Weights   09/20/17 0600 09/21/17 0526 09/22/17 0413  Weight: 70.1 kg (154 lb 8.7 oz) 74.1 kg (163 lb 5.8 oz) 69.3 kg (152 lb 12.5 oz)    Examination: Resting in bed in no acute distress.  Answered my questions appropriately.  General exam: Appears calm and comfortable  Respiratory system: Clear to auscultation. Respiratory effort normal. Cardiovascular system: S1 & S2 heard, RRR. No JVD, murmurs, rubs, gallops or clicks. No pedal edema. Gastrointestinal system: Abdomen is nondistended, soft and nontender. No organomegaly or masses felt. Normal bowel sounds heard. Central nervous system: Alert and oriented. No focal neurological deficits. Extremities: Symmetric 5 x 5 power. Skin: Sacral pressure ulcer covered with dressing.    Data Reviewed: I have personally reviewed following labs and imaging studies  CBC: Recent Labs  Lab 09/16/17 1355  09/18/17 0527 09/19/17 0528 09/20/17 0517 09/21/17 0341 09/22/17 0743  WBC 9.5   < > 18.4* 27.3* 27.3* 26.0* 12.0*  NEUTROABS 2.3  --   --   --  8.2* 11.2* 2.0  HGB 9.5*   < > 9.1* 8.1* 7.8* 7.3* 10.0*  HCT 29.1*   < > 28.4* 24.9* 24.3* 22.7* 30.3*  MCV 94.2   < > 93.1 93.6 93.1 94.2 91.8  PLT 68*   < > 23* 71* 48* 17* 23*   < > = values in this interval not displayed.   Basic Metabolic Panel: Recent Labs  Lab 09/17/17 0500 09/18/17 0527 09/20/17 0517 09/21/17 0341 09/22/17 0743  NA 140 142 143 145 141  K 3.1* 3.8 3.1* 4.1 3.6  CL 104 105 104 107 102  CO2 29 27 29 26 28   GLUCOSE 207* 181* 193* 395* 242*  BUN 15  19 27* 35* 35*  CREATININE 0.86 0.86 0.93 1.20* 1.01*  CALCIUM 7.6* 7.8* 7.8* 7.5* 7.3*  MG 1.9  --   --   --   --   PHOS  --   --  3.0 2.6 2.8   GFR: Estimated Creatinine Clearance: 41.7 mL/min (A) (by C-G formula based on SCr of 1.01 mg/dL (H)). Liver Function Tests: Recent Labs  Lab 09/17/17 0500 09/18/17 0527 09/20/17 0517 09/21/17 0341 09/22/17 0743  AST 73* 61* 44* 51* 50*  ALT 46 47 40 42 41  ALKPHOS 57 59 59 57 56  BILITOT 1.5* 1.3* 1.5* 1.7* 1.7*  PROT 6.0* 6.4* 6.3* 5.9* 6.2*  ALBUMIN 2.2* 2.2* 2.1* 2.0* 2.1*   No results for input(s): LIPASE, AMYLASE in the last 168 hours. No results for input(s): AMMONIA in the last 168 hours. Coagulation Profile: No results for input(s): INR, PROTIME in the last 168 hours. Cardiac Enzymes: No results for input(s): CKTOTAL, CKMB, CKMBINDEX, TROPONINI in the last 168 hours. BNP (last 3 results) No results for input(s): PROBNP in the last 8760 hours. HbA1C: No results for input(s): HGBA1C in the last 72 hours. CBG: Recent Labs  Lab 09/21/17 0656 09/22/17 0737  GLUCAP 333* 216*   Lipid Profile: No results for input(s):  CHOL, HDL, LDLCALC, TRIG, CHOLHDL, LDLDIRECT in the last 72 hours. Thyroid Function Tests: No results for input(s): TSH, T4TOTAL, FREET4, T3FREE, THYROIDAB in the last 72 hours. Anemia Panel: No results for input(s): VITAMINB12, FOLATE, FERRITIN, TIBC, IRON, RETICCTPCT in the last 72 hours. Sepsis Labs: Recent Labs  Lab 09/17/17 0740 09/17/17 1000 09/18/17 1220 09/19/17 0850  PROCALCITON 1.60  --   --   --   LATICACIDVEN 2.0* 2.1* 2.7* 1.5    Recent Results (from the past 240 hour(s))  Culture, blood (routine x 2)     Status: None   Collection Time: 09/12/17 12:50 PM  Result Value Ref Range Status   Specimen Description   Final    BLOOD RIGHT ANTECUBITAL Performed at [facility], [place] [address], [place], [place] [address]    Special Requests   Final    BOTTLES DRAWN AEROBIC  ONLY Blood Culture adequate volume Performed at [place] [address] [place], [place] [address]    Culture   Final    NO GROWTH 5 DAYS Performed at [facility], [place] [address], [place], [place] [address]    Report Status 09/17/2017 FINAL  Final  Culture, blood (routine x 2)     Status: None   Collection Time: 09/12/17 12:50 PM  Result Value Ref Range Status   Specimen Description   Final    BLOOD RIGHT HAND Performed at [place] [address], [place], [place] [address]    Special Requests   Final    BOTTLES DRAWN AEROBIC AND ANAEROBIC Blood Culture adequate volume Performed at [place] [address], [place], [place] [address]    Culture   Final    NO GROWTH 5 DAYS Performed at [facility], [place] [address], [place], [place] [address]    Report Status 09/17/2017 FINAL  Final  MRSA PCR Screening     Status: None   Collection Time: 09/12/17  3:38 PM  Result Value Ref Range Status   MRSA by PCR NEGATIVE NEGATIVE Final    Comment:        The GeneXpert MRSA Assay (FDA approved for NASAL specimens only), is one component of a comprehensive MRSA colonization surveillance program. It is not intended to diagnose MRSA infection nor to guide or monitor treatment for MRSA infections. Performed at [facility], [place] [address] [place], [place] [address]   Culture, blood (routine x 2)     Status: None (Preliminary result)   Collection Time: 09/17/17  7:30 AM  Result Value Ref Range Status   Specimen Description BLOOD LEFT HAND  Final   Special Requests   Final    BOTTLES DRAWN AEROBIC ONLY Blood Culture adequate volume Performed at [facility] [address] [place], [place] [address]    Culture NO GROWTH 4 DAYS  Final   Report Status PENDING  Incomplete  Culture, blood (routine x 2)     Status: None (Preliminary result)   Collection Time: 09/17/17   7:38 AM  Result Value Ref Range Status   Specimen Description BLOOD LEFT HAND  Final   Special Requests   Final    BOTTLES DRAWN AEROBIC ONLY Blood Culture adequate volume Performed at [place] [address], [place], [place] [address]    Culture NO GROWTH 4 DAYS  Final   Report Status PENDING  Incomplete  Radiology Studies: Dg Chest Port 1 View  Result Date: 09/21/2017 CLINICAL DATA:  Shortness of breath and fever. Acute myeloid leukemia, atrial fibrillation. EXAM: PORTABLE CHEST 1 VIEW COMPARISON:  Chest x-ray of September 17, 2017 and chest CT scan of September 19, 2017. FINDINGS: The lungs are adequately inflated. The new confluent airspace opacities are present greatest on the left. There is no pleural effusion or pneumothorax. The heart is enlarged. The pulmonary vascularity is indistinct but definite cephalization is not observed. There is calcification in the wall of the thoracic aorta. The right-sided PICC line tip projects over the midportion of the SVC. IMPRESSION: Interval development of airspace opacities greatest on the left most compatible with pneumonia. Underlying low-grade CHF with stable cardiomegaly. Thoracic aortic atherosclerosis. Electronically Signed   By: David  Lewis M.D.   On: 09/21/2017 08:20        Scheduled Meds: . antiseptic oral rinse  15 mL Mouth Rinse Q4H  . Chlorhexidine Gluconate Cloth  6 each Topical Daily  . collagenase   Topical Daily  . diltiazem  180 mg Oral Q12H  . feeding supplement (ENSURE ENLIVE)  237 mL Oral BID BM  . ketorolac  1 drop Both Eyes QID  . lidocaine  1 patch Transdermal QAC breakfast  . mouth rinse  15 mL Mouth Rinse BID  . multivitamin with minerals  1 tablet Oral Daily  . nutrition supplement (JUVEN)  1 packet Oral BID BM  . polyethylene glycol  17 g Oral BID  . prednisoLONE acetate  1 drop Both Eyes QID  . sodium chloride flush  10-40 mL Intracatheter Q12H   Continuous Infusions: . sodium  chloride    . sodium chloride 50 mL/hr at 09/22/17 0839  . fluconazole (DIFLUCAN) IV       LOS: 16 days     Georgette Shell, MD [facility]  If 7PM-7AM, please contact night-coverage [email or web address] Password [facility] 09/22/2017, 10:57 AM

## 2017-09-23 DIAGNOSIS — L89150 Pressure ulcer of sacral region, unstageable: Secondary | ICD-10-CM

## 2017-09-23 LAB — PREPARE PLATELET PHERESIS
UNIT DIVISION: 0
Unit division: 0

## 2017-09-23 LAB — COMPREHENSIVE METABOLIC PANEL
ALK PHOS: 63 U/L (ref 38–126)
ALT: 47 U/L (ref 14–54)
ANION GAP: 9 (ref 5–15)
AST: 52 U/L — ABNORMAL HIGH (ref 15–41)
Albumin: 2.1 g/dL — ABNORMAL LOW (ref 3.5–5.0)
BUN: 22 mg/dL — ABNORMAL HIGH (ref 6–20)
CALCIUM: 7.3 mg/dL — AB (ref 8.9–10.3)
CHLORIDE: 102 mmol/L (ref 101–111)
CO2: 30 mmol/L (ref 22–32)
CREATININE: 0.83 mg/dL (ref 0.44–1.00)
Glucose, Bld: 194 mg/dL — ABNORMAL HIGH (ref 65–99)
Potassium: 3.4 mmol/L — ABNORMAL LOW (ref 3.5–5.1)
SODIUM: 141 mmol/L (ref 135–145)
Total Bilirubin: 2.3 mg/dL — ABNORMAL HIGH (ref 0.3–1.2)
Total Protein: 6.1 g/dL — ABNORMAL LOW (ref 6.5–8.1)

## 2017-09-23 LAB — CBC WITH DIFFERENTIAL/PLATELET
BASOS ABS: 0 10*3/uL (ref 0.0–0.1)
BASOS PCT: 0 %
Blasts: 4 %
EOS PCT: 0 %
Eosinophils Absolute: 0 10*3/uL (ref 0.0–0.7)
HCT: 28.7 % — ABNORMAL LOW (ref 36.0–46.0)
Hemoglobin: 9.3 g/dL — ABNORMAL LOW (ref 12.0–15.0)
LYMPHS PCT: 18 %
Lymphs Abs: 2.1 10*3/uL (ref 0.7–4.0)
MCH: 29.9 pg (ref 26.0–34.0)
MCHC: 32.4 g/dL (ref 30.0–36.0)
MCV: 92.3 fL (ref 78.0–100.0)
MONO ABS: 7.5 10*3/uL (ref 0.1–1.0)
Monocytes Relative: 66 %
Neutro Abs: 1.8 10*3/uL (ref 1.7–7.7)
Neutrophils Relative %: 16 %
PLATELETS: 35 10*3/uL — AB (ref 150–400)
RBC: 3.11 MIL/uL — AB (ref 3.87–5.11)
RDW: 18.4 % — AB (ref 11.5–15.5)
WBC: 11.5 10*3/uL — AB (ref 4.0–10.5)

## 2017-09-23 LAB — BPAM PLATELET PHERESIS
BLOOD PRODUCT EXPIRATION DATE: 201906202359
BLOOD PRODUCT EXPIRATION DATE: 201906202359
ISSUE DATE / TIME: 201906191411
ISSUE DATE / TIME: 201906202229
UNIT TYPE AND RH: 7300
UNIT TYPE AND RH: 7300

## 2017-09-23 LAB — URIC ACID: URIC ACID, SERUM: 2.8 mg/dL (ref 2.3–6.6)

## 2017-09-23 LAB — PHOSPHORUS: PHOSPHORUS: 2.2 mg/dL — AB (ref 2.5–4.6)

## 2017-09-23 LAB — LACTATE DEHYDROGENASE: LDH: 657 U/L — AB (ref 98–192)

## 2017-09-23 MED FILL — VENCLEXTA 100 MG TABS: 100 | 30 days supply | Qty: 120 | Fill #0

## 2017-09-23 NOTE — Progress Notes (Signed)
Non face to face encounter-  Received call from patient's daughter Joycelyn Schmid and patient's grandson [name]. Margaret had questions regarding patient's ventoclax prescription to which I directed her to call Dr. Antonieta Pert office for the best information.  Joycelyn Schmid states she is conflicted and asks, "why was she given first chemo treatment if it isn't making her better?"  I attempted to help her see that treatments are not always guaranteed to work, they come with side effects, and that providers will typically always offer what best treatment are available to the patients and it is up to patient's and families to weigh risks and benefits of treatments and decide how they wish to proceed.  Margaret then asked for a review of what pills she is receiving while in the hospital- we discussed that patient had been prescribed cardizem for A fib- Joycelyn Schmid stated patient had never had atrial fib before and that she was hesitant to believe me that this is what it is prescribed for for her Mom. Before I was able to research patient's chart to let Joycelyn Schmid know this was a new diagnosis this admission- Joycelyn Schmid stated she was going to call patient's primary care giver and ended our conversation abruptly.  I was unable to engage Joycelyn Schmid in [facility] discussion or address code status.   [name], AGNP-C Palliative Medicine  Please call Palliative Medicine team phone with any questions [phone]. For individual providers please see AMION.  Total time: 30 mins

## 2017-09-23 NOTE — Progress Notes (Signed)
Unfortunately, it is hard to say how much progress we are now making with Leah Lewis.  Her white cell count is come down.  I am encouraged by this.  In talking to her family, I just am not sure that they will agree for her to be on venetoclax.  I spoke with her daughter Leah Lewis yesterday.  It sounds like she was more willing to agree to sign for the venetoclax.  Leah Lewis just is not eating.  I am not sure what the problem is with respect to her not eating.  There is no nausea or vomiting.  I do find it incredibly interesting that her temperature spikes have come down quite nicely.  Again I have to believe that the temperature spikes are secondary to the leukemia.  I think she had a little bit of thrush in her mouth.  We started her on Diflucan yesterday.  Her mouth does look better today.  Her blood counts today show her white cell count to be 11.5.  Hemoglobin 9.3.  Platelet count 35,000.  Her LDH is 657.  Her creatinine is 0.83.  On her physical exam, her temperature is 98.  Pulse 83.  Blood pressure 115/60.  Her head and neck exam shows no adenopathy.  She has no obvious intraoral lesions.  I do not see any thrush.  Her mouth does not appear to be as dry.  Her lungs sound pretty good.  Her cardiac exam is regular rate and rhythm but not going nearly as fast.  Her abdomen is soft.  Bowel sounds are slightly decreased.  I cannot palpate any spleen on her.  Extremities shows some muscle atrophy.  Leah Lewis has AML.  By her cytogenetics, she has adverse risk factors.  It would not surprise me if this is a secondary type of AML.  Again, the family has different perspectives amongst themselves as to what needs to be done.  I am not sure as to what else we can do for Leah Lewis.  Her white cell count has responded as I expected.  How long we will see a response to the Dacogen alone is hard to say.  There was some question about her having a feeding tube put in.  I think this is actually not a  bad idea but I think it would be risky as she would likely pull on it and then she would be at risk for aspirating.  I think she aspirated, this would be incredibly morbid for her.  Leah Lewis certainly cannot go home.  I know her family is incredibly concerned and would try to help as much as possible but I think there is way too much going on with her for her to be cared for at home.  As always, we had our prayer session.  I am praying hard for her.  I just wish that we could be using the Venetoclax with the Dacogen.  I just pray that she becomes more responsive.  She is still a full code.  I also worry about this because if something were to happen to her, and she was put on life support, she would never come off life support.  [name], MD  2 Corinthians 1:3-4

## 2017-09-23 NOTE — Progress Notes (Signed)
Daily Progress Note   Patient Name: Leah Lewis       Date: 09/23/2017 DOB: 06-15-1934  Age: 83 y.o. MRN#: [id number] Attending Physician: Georgette Shell, MD Primary Care Physician: Wenda Low, MD Admit Date: 09/27/2017  Reason for Consultation/Follow-up: Establishing goals of care  Subjective: Patient in bed, being cleaned by techs. Daughter Joycelyn Schmid at bedside, upset by pressure wound that she sees on sacrum- noted in chart wound was present on admission. Attempted to review chart with Joycelyn Schmid and engage in [place] discussion- however, Joycelyn Schmid left and stated she was "going to the second floor" to ask them about patient's wound.   ROS  Length of Stay: 17  Current Medications: Scheduled Meds:  . Chlorhexidine Gluconate Cloth  6 each Topical Daily  . collagenase   Topical Daily  . diltiazem  60 mg Oral Q12H  . feeding supplement (ENSURE ENLIVE)  237 mL Oral TID BM  . ketorolac  1 drop Both Eyes QID  . lidocaine  1 patch Transdermal QAC breakfast  . mouth rinse  15 mL Mouth Rinse BID  . multivitamin with minerals  1 tablet Oral Daily  . nutrition supplement (JUVEN)  1 packet Oral BID BM  . nystatin  5 mL Oral Q8H  . polyethylene glycol  17 g Oral BID  . prednisoLONE acetate  1 drop Both Eyes QID  . sodium chloride flush  10-40 mL Intracatheter Q12H    Continuous Infusions: . sodium chloride    . sodium chloride 50 mL/hr at 09/23/17 1234  . fluconazole (DIFLUCAN) IV 100 mg (09/23/17 1234)    PRN Meds: acetaminophen **OR** acetaminophen, alteplase, heparin lock flush, heparin lock flush, HYDROcodone-acetaminophen, labetalol, ondansetron (ZOFRAN) IV, sodium chloride flush, sodium chloride flush  Physical Exam          Vital Signs: BP 121/81 (BP Location: Left Arm)    Pulse 87   Temp 97.8 F (36.6 C) (Oral)   Resp 16   Ht 5\' 4"  (1.626 m)   Wt 74.5 kg (164 lb 3.9 oz)   SpO2 94%   BMI 28.19 kg/m  SpO2: SpO2: 94 % O2 Device: O2 Device: Nasal Cannula O2 Flow Rate: O2 Flow Rate (L/min): 2 L/min  Intake/output summary:   Intake/Output Summary (Last 24 hours) at 09/23/2017 1450 Last data filed at 09/23/2017 1443  Gross per 24 hour  Intake 2120.67 ml  Output 1750 ml  Net 370.67 ml   LBM: Last BM Date: 09/22/17 Baseline Weight: Weight: 68.9 kg (152 lb) Most recent weight: Weight: 74.5 kg (164 lb 3.9 oz)       Palliative Assessment/Data: PPS: 20%    Flowsheet Rows     Most Recent Value  Intake Tab  Referral Department  Hospitalist  Unit at Time of Referral  Med/Surg Unit  Palliative Care Primary Diagnosis  Neurology  Date Notified  09/15/17  Palliative Care Type  New Palliative care  Reason for referral  Clarify Goals of Care  Date of Admission  09/23/2017  Date first seen by Palliative Care  09/16/17  # of days Palliative referral response time  1 Day(s)  # of days IP prior to Palliative referral  10  Clinical Assessment  Psychosocial & Spiritual Assessment  Palliative Care Outcomes      Patient Active Problem List   Diagnosis Date Noted  . Thrush 09/22/2017  . Fever   . Pancytopenia ([place])   . Atrial fibrillation with RVR ([place])   . AML (acute myeloblastic leukemia) ([place])   . Advance care planning   . Palliative care by specialist   . AML (acute myeloid leukemia) ([place]) 09/12/2017  . Goals of care, counseling/discussion 09/12/2017  . Pressure injury of skin 09/06/2017  . DVT (deep venous thrombosis), left 03/02/2015  . DVT (deep venous thrombosis), unspecified laterality 03/02/2015  . GERD (gastroesophageal reflux disease) 03/02/2015  . Chest pain 04/20/2011  . Cervical radiculopathy 04/20/2011  . Hypertension   . Stroke ([place])   . High cholesterol     Palliative Care Assessment & Plan   Patient Profile: 83  y.o.femalewith past medical history of osteoporosis, hyperlipidemia, occlusive lower left extremity DVT (on Xarelto), CVA,admitted on 6/3/2019with worsening weakness and fatigue.She was scheduled to be seen by oncology same day of admission but missed the appointment due to weakness. Workup this admission has revealed AML and she has been started on treatment with Dacogen. Palliative medicine consulted for [place].  Assessment/Recommendations/Plan   Per discussion with Joycelyn Schmid and [name] by phone this morning they are contacting Dr. Antonieta Pert office to try and obtain a new prescription for oral chemotherapy d/t Albertina Parr has it and will not give it to them  Patient has shown very limited improvement in her functional status since admission. However, family members do not appear to be ready to transition to full comfort care- recommend continue current scope of care  If patient is dc'd to SNF- would recommend Palliative oupatient referral  PMT will shadow over weekend and f/u on Tuesday- please page if there is an immediate need in the interim  Goals of Care and Additional Recommendations:  Limitations on Scope of Treatment: Full Scope Treatment  Code Status:  Full code  Prognosis:   Unable to determine  Discharge Planning:  [place] for rehab with Palliative care service follow-up  Care plan was discussed with Joycelyn Schmid- patient's daughter.   Thank you for allowing the Palliative Medicine Team to assist in the care of this patient.   Time In: 1330 Time Out: 1355 Total Time 25 mins Prolonged Time Billed no      Greater than 50%  of this time was spent counseling and coordinating care related to the above assessment and plan.  [name], AGNP-C Palliative Medicine   Please contact Palliative Medicine Team phone at [phone] for questions and concerns.

## 2017-09-23 NOTE — Progress Notes (Signed)
Pt refuses medications and assessments. She stated " I ve told you to leave me alone and I"m not taking any pills, so leave me alone"

## 2017-09-23 NOTE — Progress Notes (Signed)
PROGRESS NOTE    Leah Lewis  OZY:[id number] DOB: 08-Aug-1934 DOA: 09/18/2017 PCP: Wenda Low, MD  Brief Narrative43 year old female with past medical history of osteoporosis, hyperlipidemia, DVT on Xarelto presented to the emergency department complaining of generalized weakness. 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 During hospital stay patient spiking fevers, being treated with antibiotic but obvious source of infection. Patient developed A. fib with RVR, cardiology consulted and treated with Cardizem. Thrombocytopenia worsened and she was transfused 1 unit of platelets. Per oncology poor prognosis and palliative care has been consulted. Patient has been started on chemotherapy. Heart rate improving.      Assessment & Plan:   Active Problems:   Pressure injury of skin   AML (acute myeloid leukemia) (HCC)   Goals of care, counseling/discussion   AML (acute myeloblastic leukemia) ([place])   Advance care planning   Palliative care by specialist   Fever   Pancytopenia ([place])   Atrial fibrillation with RVR ([place])   Thrush AML -family still undecided on the new chemo at this time.  Oncology following.  C count improving platelets and hemoglobin improving. Encourage oral hydration and nutrition. Palliative care following.  Thrombocytopenia/anemia status post blood transfusion and platelet transfusion.  Hemoglobin increased to 10. Secondary to AML No signs of overt bleeding, continue to monitor CBC  New diagnosis A. fib with RVR-improved Cardiology consulted andinitially treated withCardizem drip, she has been transitioned to Cardizem p.o. Rhythm remains in A. fib with goodcontrol. Continue Cardizem at a  Lower dose due to soft blood pressure. Not on anticoagulation due to thrombocytopenia and anemia.  HTN soft decrease Cardizem yesterday.      DVT prophylaxis:  Code Status: Full code Family  Communication: None Disposition Plan: Since patient family refusing the new chemo will require SNF placement upon discharge. Consultants:  Oncology palliative care Procedures: None Antimicrobials: None  Subjective: Patient resting in bed, she has not had a breakfast yet but treated currently that she is going to eat a little later.  When I spoke to her about placing a tube for feeding she said NO she doesn't want one.   Objective: Vitals:   09/22/17 2301 09/22/17 2355 09/23/17 0424 09/23/17 1329  BP: 118/70 117/60 115/60 121/81  Pulse: 92 90 83 87  Resp: (!) 22 (!) 24 16 16   Temp: 98.5 F (36.9 C) 98.3 F (36.8 C) 98 F (36.7 C) 97.8 F (36.6 C)  TempSrc: Oral Oral Oral Oral  SpO2: 100% 97% 98% 94%  Weight:   74.5 kg (164 lb 3.9 oz)   Height:        Intake/Output Summary (Last 24 hours) at 09/23/2017 1344 Last data filed at 09/23/2017 0619 Gross per 24 hour  Intake 1760.67 ml  Output 1750 ml  Net 10.67 ml   Filed Weights   09/21/17 0526 09/22/17 0413 09/23/17 0424  Weight: 74.1 kg (163 lb 5.8 oz) 69.3 kg (152 lb 12.5 oz) 74.5 kg (164 lb 3.9 oz)    Examination:  General exam: Appears calm and comfortable  Respiratory system: Clear to auscultation. Respiratory effort normal. Cardiovascular system: S1 & S2 heard, RRR. No JVD, murmurs, rubs, gallops or clicks. No pedal edema. Gastrointestinal system: Abdomen is nondistended, soft and nontender. No organomegaly or masses felt. Normal bowel sounds heard. Central nervous system: Alert and oriented. No focal neurological deficits. Extremities: Symmetric 5 x 5 power. Skin: No rashes, lesions or ulcers Psychiatry: Judgement and insight appear  normal. Mood & affect appropriate.     Data Reviewed: I have personally reviewed following labs and imaging studies  CBC: Recent Labs  Lab 09/16/17 1355  09/19/17 0528 09/20/17 0517 09/21/17 0341 09/22/17 0743 09/23/17 0421  WBC 9.5   < > 27.3* 27.3* 26.0* 12.0* 11.5*    NEUTROABS 2.3  --   --  8.2* 11.2* 2.0 1.8  HGB 9.5*   < > 8.1* 7.8* 7.3* 10.0* 9.3*  HCT 29.1*   < > 24.9* 24.3* 22.7* 30.3* 28.7*  MCV 94.2   < > 93.6 93.1 94.2 91.8 92.3  PLT 68*   < > 71* 48* 17* 23* 35*   < > = values in this interval not displayed.   Basic Metabolic Panel: Recent Labs  Lab 09/17/17 0500 09/18/17 0527 09/20/17 0517 09/21/17 0341 09/22/17 0743 09/23/17 0421  NA 140 142 143 145 141 141  K 3.1* 3.8 3.1* 4.1 3.6 3.4*  CL 104 105 104 107 102 102  CO2 29 27 29 26 28 30   GLUCOSE 207* 181* 193* 395* 242* 194*  BUN 15 19 27* 35* 35* 22*  CREATININE 0.86 0.86 0.93 1.20* 1.01* 0.83  CALCIUM 7.6* 7.8* 7.8* 7.5* 7.3* 7.3*  MG 1.9  --   --   --   --   --   PHOS  --   --  3.0 2.6 2.8 2.2*   GFR: Estimated Creatinine Clearance: 52.5 mL/min (by C-G formula based on SCr of 0.83 mg/dL). Liver Function Tests: Recent Labs  Lab 09/18/17 0527 09/20/17 0517 09/21/17 0341 09/22/17 0743 09/23/17 0421  AST 61* 44* 51* 50* 52*  ALT 47 40 42 41 47  ALKPHOS 59 59 57 56 63  BILITOT 1.3* 1.5* 1.7* 1.7* 2.3*  PROT 6.4* 6.3* 5.9* 6.2* 6.1*  ALBUMIN 2.2* 2.1* 2.0* 2.1* 2.1*   No results for input(s): LIPASE, AMYLASE in the last 168 hours. No results for input(s): AMMONIA in the last 168 hours. Coagulation Profile: No results for input(s): INR, PROTIME in the last 168 hours. Cardiac Enzymes: No results for input(s): CKTOTAL, CKMB, CKMBINDEX, TROPONINI in the last 168 hours. BNP (last 3 results) No results for input(s): PROBNP in the last 8760 hours. HbA1C: No results for input(s): HGBA1C in the last 72 hours. CBG: Recent Labs  Lab 09/21/17 0656 09/22/17 0737  GLUCAP 333* 216*   Lipid Profile: No results for input(s): CHOL, HDL, LDLCALC, TRIG, CHOLHDL, LDLDIRECT in the last 72 hours. Thyroid Function Tests: No results for input(s): TSH, T4TOTAL, FREET4, T3FREE, THYROIDAB in the last 72 hours. Anemia Panel: No results for input(s): VITAMINB12, FOLATE, FERRITIN,  TIBC, IRON, RETICCTPCT in the last 72 hours. Sepsis Labs: Recent Labs  Lab 09/17/17 0740 09/17/17 1000 09/18/17 1220 09/19/17 0850  PROCALCITON 1.60  --   --   --   LATICACIDVEN 2.0* 2.1* 2.7* 1.5    Recent Results (from the past 240 hour(s))  Culture, blood (routine x 2)     Status: None   Collection Time: 09/17/17  7:30 AM  Result Value Ref Range Status   Specimen Description   Final    BLOOD LEFT HAND Performed at [facility], [place] [address] [place], [place] [address]    Special Requests   Final    BOTTLES DRAWN AEROBIC ONLY Blood Culture adequate volume Performed at [facility] [address], [place], [place] [address]    Culture   Final    NO GROWTH 5 DAYS Performed  at [facility], [place] [address], [place], [place] [address]    Report Status 09/22/2017 FINAL  Final  Culture, blood (routine x 2)     Status: None   Collection Time: 09/17/17  7:38 AM  Result Value Ref Range Status   Specimen Description   Final    BLOOD LEFT HAND Performed at [facility] [address], [place], [place] [address]    Special Requests   Final    BOTTLES DRAWN AEROBIC ONLY Blood Culture adequate volume Performed at [facility] [address] [place], [place] [address]    Culture   Final    NO GROWTH 5 DAYS Performed at [facility], [place] [address] [place], [place] [address]    Report Status 09/22/2017 FINAL  Final         Radiology Studies: No results found.      Scheduled Meds: . Chlorhexidine Gluconate Cloth  6 each Topical Daily  . collagenase   Topical Daily  . diltiazem  60 mg Oral Q12H  . feeding supplement (ENSURE ENLIVE)  237 mL Oral TID BM  . ketorolac  1 drop Both Eyes QID  . lidocaine  1 patch Transdermal QAC breakfast  . mouth rinse  15 mL Mouth Rinse BID  . multivitamin with minerals  1 tablet Oral Daily  . nutrition supplement (JUVEN)  1 packet Oral BID BM    . nystatin  5 mL Oral Q8H  . polyethylene glycol  17 g Oral BID  . prednisoLONE acetate  1 drop Both Eyes QID  . sodium chloride flush  10-40 mL Intracatheter Q12H   Continuous Infusions: . sodium chloride    . sodium chloride 50 mL/hr at 09/23/17 1234  . fluconazole (DIFLUCAN) IV 100 mg (09/23/17 1234)     LOS: 17 days    Georgette Shell, MD [facility] If 7PM-7AM, please contact night-coverage [email or web address] Password [email or web address] 09/23/2017, 1:44 PM

## 2017-09-24 DIAGNOSIS — D696 Thrombocytopenia, unspecified: Secondary | ICD-10-CM

## 2017-09-24 DIAGNOSIS — D61818 Other pancytopenia: Secondary | ICD-10-CM

## 2017-09-24 DIAGNOSIS — B370 Candidal stomatitis: Secondary | ICD-10-CM

## 2017-09-24 DIAGNOSIS — R7989 Other specified abnormal findings of blood chemistry: Secondary | ICD-10-CM

## 2017-09-24 LAB — URIC ACID: Uric Acid, Serum: 2.7 mg/dL (ref 2.3–6.6)

## 2017-09-24 LAB — CBC WITH DIFFERENTIAL/PLATELET
Band Neutrophils: 2 %
Basophils Absolute: 0 10*3/uL (ref 0.0–0.1)
Basophils Relative: 0 %
Blasts: 12 %
Eosinophils Absolute: 0 10*3/uL (ref 0.0–0.7)
Eosinophils Relative: 0 %
HCT: 27.5 % — ABNORMAL LOW (ref 36.0–46.0)
Hemoglobin: 8.9 g/dL — ABNORMAL LOW (ref 12.0–15.0)
LYMPHS ABS: 4.4 10*3/uL — AB (ref 0.7–4.0)
LYMPHS PCT: 38 %
MCH: 30.4 pg (ref 26.0–34.0)
MCHC: 32.4 g/dL (ref 30.0–36.0)
MCV: 93.9 fL (ref 78.0–100.0)
MONOS PCT: 24 %
Monocytes Absolute: 2.8 10*3/uL — ABNORMAL HIGH (ref 0.1–1.0)
Myelocytes: 7 %
Neutro Abs: 3 10*3/uL (ref 1.7–7.7)
Neutrophils Relative %: 17 %
Platelets: 8 10*3/uL — CL (ref 150–400)
RBC: 2.93 MIL/uL — AB (ref 3.87–5.11)
RDW: 18 % — ABNORMAL HIGH (ref 11.5–15.5)
WBC: 11.6 10*3/uL — AB (ref 4.0–10.5)
nRBC: 1 /100 WBC — ABNORMAL HIGH

## 2017-09-24 LAB — COMPREHENSIVE METABOLIC PANEL
ALT: 58 U/L — ABNORMAL HIGH (ref 14–54)
ANION GAP: 10 (ref 5–15)
AST: 68 U/L — ABNORMAL HIGH (ref 15–41)
Albumin: 1.8 g/dL — ABNORMAL LOW (ref 3.5–5.0)
Alkaline Phosphatase: 67 U/L (ref 38–126)
BUN: 22 mg/dL — ABNORMAL HIGH (ref 6–20)
CHLORIDE: 99 mmol/L — AB (ref 101–111)
CO2: 30 mmol/L (ref 22–32)
Calcium: 7.3 mg/dL — ABNORMAL LOW (ref 8.9–10.3)
Creatinine, Ser: 0.84 mg/dL (ref 0.44–1.00)
GFR calc non Af Amer: >60 mL/min (ref >60)
Glucose, Bld: 240 mg/dL — ABNORMAL HIGH (ref 65–99)
POTASSIUM: 3.1 mmol/L — AB (ref 3.5–5.1)
SODIUM: 139 mmol/L (ref 135–145)
Total Bilirubin: 2.7 mg/dL — ABNORMAL HIGH (ref 0.3–1.2)
Total Protein: 5.7 g/dL — ABNORMAL LOW (ref 6.5–8.1)

## 2017-09-24 LAB — LACTATE DEHYDROGENASE: LDH: 650 U/L — AB (ref 98–192)

## 2017-09-24 LAB — PHOSPHORUS: Phosphorus: 2.6 mg/dL (ref 2.5–4.6)

## 2017-09-24 MED ORDER — HEPARIN SOD (PORK) LOCK FLUSH 100 UNIT/ML IV SOLN
500.0000 [IU] | Freq: Every day | INTRAVENOUS | Status: DC | PRN
  Filled 2017-09-24: qty 5

## 2017-09-24 MED ORDER — SODIUM CHLORIDE 0.9 % IV SOLN
250.0000 mL | Freq: Once | INTRAVENOUS | Status: AC
  Administered 2017-09-24: 250 mL via INTRAVENOUS

## 2017-09-24 MED ORDER — ACETAMINOPHEN 325 MG PO TABS
650.0000 mg | ORAL_TABLET | Freq: Once | ORAL | Status: AC
  Administered 2017-09-24: 650 mg via ORAL
  Filled 2017-09-24: qty 2

## 2017-09-24 MED ORDER — HEPARIN SOD (PORK) LOCK FLUSH 100 UNIT/ML IV SOLN
250.0000 [IU] | INTRAVENOUS | Status: DC | PRN
  Filled 2017-09-24: qty 2.5

## 2017-09-24 MED ORDER — SODIUM CHLORIDE 0.9 % IV SOLN: Freq: Once | INTRAVENOUS | Status: DC

## 2017-09-24 MED ORDER — SODIUM CHLORIDE 0.9% FLUSH: 10.0000 mL | INTRAVENOUS | Status: DC | PRN

## 2017-09-24 MED ORDER — SODIUM CHLORIDE 0.9% FLUSH
3.0000 mL | INTRAVENOUS | Status: AC | PRN
  Administered 2017-09-26: 3 mL

## 2017-09-24 NOTE — Progress Notes (Signed)
PROGRESS NOTE    Leah Lewis  YQI:[id number] DOB: [birth year] DOA: 09/10/2017 PCP: Wenda Low, MD  Brief Narrative:83 year old female with past medical history of osteoporosis, hyperlipidemia, DVT on Xarelto presented to the emergency department complaining of generalized weakness. 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 During hospital stay patient spiking fevers, being treated with antibiotic but obvious source of infection. Patient developed A. fib with RVR, cardiology consulted and treated with Cardizem. Thrombocytopenia worsened and she was transfused 1 unit of platelets. Per oncology poor prognosis and palliative care has been consulted. Patient has been started on chemotherapy. Heart rate improving.      Assessment & Plan:   Active Problems:   Pressure injury of skin   AML (acute myeloid leukemia) (HCC)   Goals of care, counseling/discussion   AML (acute myeloblastic leukemia) ([employer])   Advance care planning   Palliative care by specialist   Fever   Pancytopenia ([place])   Atrial fibrillation with RVR ([place])   Thrush  AML -family still undecided on the new chemo at this time.  Oncology following.  Platelets down to 8 again.  Hemoglobin and WBC count stable.  Will arrange for platelet transfusion. Encourage oral hydration and nutrition. Palliative carefollowing.  Thrombocytopenia/anemiastatus post blood transfusion and platelet transfusion. Secondary to AML No signs of overt bleeding, continue to monitor CBC  New diagnosis A. fib with RVR-improved Cardiology consulted andinitially treated withCardizem drip, she has been transitioned to Cardizem p.o. Rhythm remains in A. fib with goodcontrol. Continue Cardizem at a  Lower dose due to soft blood pressure. Not on anticoagulation due to thrombocytopenia and anemia.  HTN soft decrease Cardizem yesterday.   unstageable sacral pressure  ulcer-patient was seen by wound care 09/13/2017.  This wound was present upon admission.  Continued low air loss mattress and Santyl for now.       DVT prophylaxis scd Code Status: Full code Family Communication:none Disposition Plan: Patient's family still not in agreement or have given consent for the second chemotherapy.  Patient will likely need to be discharged to a skilled nursing facility early next week.  Palliative care to follow her at the facility.   Consultants: Oncology, palliative care  Procedures: None Antimicrobials: None  Subjective: Resting in bed awake answer my questions appropriately nurse tech reported that she ate half of her oatmeal and drank half of the ginger ale this morning.   Objective: Vitals:   09/23/17 0424 09/23/17 1329 09/23/17 2050 09/24/17 0452  BP: 115/60 121/81 106/69 (!) 113/55  Pulse: 83 87 88 97  Resp: 16 16 14 18   Temp: 98 F (36.7 C) 97.8 F (36.6 C) 98.7 F (37.1 C) 98.5 F (36.9 C)  TempSrc: Oral Oral Oral Oral  SpO2: 98% 94% 97% 100%  Weight: 74.5 kg (164 lb 3.9 oz)   73.9 kg (162 lb 14.7 oz)  Height:        Intake/Output Summary (Last 24 hours) at 09/24/2017 1043 Last data filed at 09/24/2017 0600 Gross per 24 hour  Intake 1560 ml  Output 2750 ml  Net -1190 ml   Filed Weights   09/22/17 0413 09/23/17 0424 09/24/17 0452  Weight: 69.3 kg (152 lb 12.5 oz) 74.5 kg (164 lb 3.9 oz) 73.9 kg (162 lb 14.7 oz)    Examination:  General exam: Appears calm and comfortable  Respiratory system: decreased breath sounds at b/l bases auscultation. Respiratory effort normal. Cardiovascular system: S1 & S2 heard, RRR.  No JVD, murmurs, rubs, gallops or clicks. No pedal edema. Gastrointestinal system: Abdomen is nondistended, soft and nontender. No organomegaly or masses felt. Normal bowel sounds heard. Central nervous system: Alert and oriented. No focal neurological deficits. Extremities: Symmetric 5 x 5 power. Skin: No rashes,  lesions or ulcers Psychiatry: Judgement and insight appear normal. Mood & affect appropriate.     Data Reviewed: I have personally reviewed following labs and imaging studies  CBC: Recent Labs  Lab 09/20/17 0517 09/21/17 0341 09/22/17 0743 09/23/17 0421 09/24/17 0500  WBC 27.3* 26.0* 12.0* 11.5* 11.6*  NEUTROABS 8.2* 11.2* 2.0 1.8 3.0  HGB 7.8* 7.3* 10.0* 9.3* 8.9*  HCT 24.3* 22.7* 30.3* 28.7* 27.5*  MCV 93.1 94.2 91.8 92.3 93.9  PLT 48* 17* 23* 35* 8*   Basic Metabolic Panel: Recent Labs  Lab 09/20/17 0517 09/21/17 0341 09/22/17 0743 09/23/17 0421 09/24/17 0500  NA 143 145 141 141 139  K 3.1* 4.1 3.6 3.4* 3.1*  CL 104 107 102 102 99*  CO2 29 26 28 30 30   GLUCOSE 193* 395* 242* 194* 240*  BUN 27* 35* 35* 22* 22*  CREATININE 0.93 1.20* 1.01* 0.83 0.84  CALCIUM 7.8* 7.5* 7.3* 7.3* 7.3*  PHOS 3.0 2.6 2.8 2.2* 2.6   GFR: Estimated Creatinine Clearance: 51.7 mL/min (by C-G formula based on SCr of 0.84 mg/dL). Liver Function Tests: Recent Labs  Lab 09/20/17 0517 09/21/17 0341 09/22/17 0743 09/23/17 0421 09/24/17 0500  AST 44* 51* 50* 52* 68*  ALT 40 42 41 47 58*  ALKPHOS 59 57 56 63 67  BILITOT 1.5* 1.7* 1.7* 2.3* 2.7*  PROT 6.3* 5.9* 6.2* 6.1* 5.7*  ALBUMIN 2.1* 2.0* 2.1* 2.1* 1.8*   No results for input(s): LIPASE, AMYLASE in the last 168 hours. No results for input(s): AMMONIA in the last 168 hours. Coagulation Profile: No results for input(s): INR, PROTIME in the last 168 hours. Cardiac Enzymes: No results for input(s): CKTOTAL, CKMB, CKMBINDEX, TROPONINI in the last 168 hours. BNP (last 3 results) No results for input(s): PROBNP in the last 8760 hours. HbA1C: No results for input(s): HGBA1C in the last 72 hours. CBG: Recent Labs  Lab 09/21/17 0656 09/22/17 0737  GLUCAP 333* 216*   Lipid Profile: No results for input(s): CHOL, HDL, LDLCALC, TRIG, CHOLHDL, LDLDIRECT in the last 72 hours. Thyroid Function Tests: No results for input(s): TSH,  T4TOTAL, FREET4, T3FREE, THYROIDAB in the last 72 hours. Anemia Panel: No results for input(s): VITAMINB12, FOLATE, FERRITIN, TIBC, IRON, RETICCTPCT in the last 72 hours. Sepsis Labs: Recent Labs  Lab 09/18/17 1220 09/19/17 0850  LATICACIDVEN 2.7* 1.5    Recent Results (from the past 240 hour(s))  Culture, blood (routine x 2)     Status: None   Collection Time: 09/17/17  7:30 AM  Result Value Ref Range Status   Specimen Description   Final    BLOOD LEFT HAND Performed at [facility] [address] [place], [place] [address]    Special Requests   Final    BOTTLES DRAWN AEROBIC ONLY Blood Culture adequate volume Performed at [facility] [address] [place], [place] [address]    Culture   Final    NO GROWTH 5 DAYS Performed at [facility], [place] [address], [place], [place] [address]    Report Status 09/22/2017 FINAL  Final  Culture, blood (routine x 2)     Status: None   Collection Time: 09/17/17  7:38 AM  Result  Value Ref Range Status   Specimen Description   Final    BLOOD LEFT HAND Performed at [facility] [address], [place], [place] [address]    Special Requests   Final    BOTTLES DRAWN AEROBIC ONLY Blood Culture adequate volume Performed at [facility] [address] [place], [place] [address]    Culture   Final    NO GROWTH 5 DAYS Performed at [facility], [place] [address], [place], [place] [address]    Report Status 09/22/2017 FINAL  Final         Radiology Studies: No results found.      Scheduled Meds: . collagenase   Topical Daily  . diltiazem  60 mg Oral Q12H  . feeding supplement (ENSURE ENLIVE)  237 mL Oral TID BM  . ketorolac  1 drop Both Eyes QID  . lidocaine  1 patch Transdermal QAC breakfast  . mouth rinse  15 mL Mouth Rinse BID  . multivitamin with minerals  1 tablet Oral Daily  . nutrition supplement (JUVEN)  1 packet Oral BID BM    . nystatin  5 mL Oral Q8H  . polyethylene glycol  17 g Oral BID  . prednisoLONE acetate  1 drop Both Eyes QID  . sodium chloride flush  10-40 mL Intracatheter Q12H   Continuous Infusions: . sodium chloride    . sodium chloride 50 mL/hr at 09/23/17 1234  . fluconazole (DIFLUCAN) IV Stopped (09/23/17 1335)     LOS: 18 days      Georgette Shell, MD [facility]  If 7PM-7AM, please contact night-coverage [email or web address] Password [email or web address] 09/24/2017, 10:43 AM

## 2017-09-24 NOTE — Progress Notes (Signed)
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 Marin Olp for oncology.  Recently started on Dacogen for her first cycle of treatment from 6/12  through 6/17. Has history of osteoporosis, dyslipidemia, DVT was previously on Xarelto but held due to from thrombocytopenia who got admitted with generalized weakness and was noted to have pancytopenia due to acute myeloid leukemia.  Has been in the hospital since 09/06/2015.  Receiving empiric antibiotics for fevers.  Also noted to have newly noted atrial fibrillation with RVR cardiology following. Patient on the hospitalist service.   OBJECTIVE:  PHYSICAL EXAMINATION: . Vitals:   09/23/17 0424 09/23/17 1329 09/23/17 2050 09/24/17 0452  BP: 115/60 121/81 106/69 (!) 113/55  Pulse: 83 87 88 97  Resp: '16 16 14 18  '$ Temp: 98 F (36.7 C) 97.8 F (36.6 C) 98.7 F (37.1 C) 98.5 F (36.9 C)  TempSrc: Oral Oral Oral Oral  SpO2: 98% 94% 97% 100%  Weight: 164 lb 3.9 oz (74.5 kg)   162 lb 14.7 oz (73.9 kg)  Height:       Filed Weights   09/22/17 0413 09/23/17 0424 09/24/17 0452  Weight: 152 lb 12.5 oz (69.3 kg) 164 lb 3.9 oz (74.5 kg) 162 lb 14.7 oz (73.9 kg)   .Body mass index is 27.97 kg/m.  GENERAL:alert, in no acute distress and comfortable SKIN: skin color, texture, turgor are normal, no rashes or significant lesions EYES: normal, conjunctiva are pink and non-injected, sclera clear OROPHARYNX:no exudate, no erythema and lips, no overt thrush at this time. NECK: supple, no JVD, LYMPH:  no palpable lymphadenopathy in the cervical, axillary LUNGS: coarse breath sounds HEART: regular rate & rhythm ABDOMEN: abdomen soft, non-tender, normoactive bowel sounds  Musculoskeletal:trace pedal edema PSYCH: alert & oriented x 3 with fluent speech NEURO: no focal motor/sensory  deficits  MEDICAL HISTORY:  Past Medical History:  Diagnosis Date  . AML (acute myeloid leukemia) ([place]) 09/12/2017  . Atrial fibrillation ([place]) 09/15/2017  . Goals of care, counseling/discussion 09/12/2017  . High cholesterol   . Hypertension   . Stroke ([place])   . Thrush 09/22/2017    SURGICAL HISTORY: Past Surgical History:  Procedure Laterality Date  . ABDOMINAL HYSTERECTOMY    . CATARACT EXTRACTION     bilateral  . CESAREAN SECTION      SOCIAL HISTORY: Social History   Socioeconomic History  . Marital status: Single    Spouse name: Not on file  . Number of children: Not on file  . Years of education: Not on file  . Highest education level: Not on file  Occupational History  . Not on file  Social Needs  . Financial resource strain: Not on file  . Food insecurity:    Worry: Not on file    Inability: Not on file  . Transportation needs:    Medical: Not on file    Non-medical: Not on file  Tobacco Use  . Smoking status: Never Smoker  . Smokeless tobacco: Former Systems developer    Types: Chew  Substance and Sexual Activity  . Alcohol use: No  . Drug use: No  . Sexual activity: Not on file  Lifestyle  . Physical activity:    Days per week: Not on file    Minutes per session: Not on file  . Stress: Not on file  Relationships  . Social  connections:    Talks on phone: Not on file    Gets together: Not on file    Attends religious service: Not on file    Active member of club or organization: Not on file    Attends meetings of clubs or organizations: Not on file    Relationship status: Not on file  . Intimate partner violence:    Fear of current or ex partner: Not on file    Emotionally abused: Not on file    Physically abused: Not on file    Forced sexual activity: Not on file  Other Topics Concern  . Not on file  Social History Narrative  . Not on file    FAMILY HISTORY: Family History  Problem Relation Age of Onset  . Heart attack Mother   . Pneumonia Father    . Parkinsonism Father   . Breast cancer Sister     ALLERGIES:  is allergic to percocet [oxycodone-acetaminophen].  MEDICATIONS:  Scheduled Meds: . collagenase   Topical Daily  . diltiazem  60 mg Oral Q12H  . feeding supplement (ENSURE ENLIVE)  237 mL Oral TID BM  . ketorolac  1 drop Both Eyes QID  . lidocaine  1 patch Transdermal QAC breakfast  . mouth rinse  15 mL Mouth Rinse BID  . multivitamin with minerals  1 tablet Oral Daily  . nutrition supplement (JUVEN)  1 packet Oral BID BM  . nystatin  5 mL Oral Q8H  . polyethylene glycol  17 g Oral BID  . prednisoLONE acetate  1 drop Both Eyes QID  . sodium chloride flush  10-40 mL Intracatheter Q12H   Continuous Infusions: . sodium chloride    . sodium chloride 50 mL/hr at 09/23/17 1234  . fluconazole (DIFLUCAN) IV Stopped (09/23/17 1335)   PRN Meds:.acetaminophen **OR** acetaminophen, alteplase, heparin lock flush, heparin lock flush, HYDROcodone-acetaminophen, labetalol, ondansetron (ZOFRAN) IV, sodium chloride flush, sodium chloride flush  REVIEW OF SYSTEMS:    10 Point review of Systems was done is negative except as noted above.   LABORATORY DATA:  I have reviewed the data as listed  . CBC Latest Ref Rng & Units 09/24/2017 09/23/2017 09/22/2017  WBC 4.0 - 10.5 K/uL 11.6(H) 11.5(H) 12.0(H)  Hemoglobin 12.0 - 15.0 g/dL 8.9(L) 9.3(L) 10.0(L)  Hematocrit 36.0 - 46.0 % 27.5(L) 28.7(L) 30.3(L)  Platelets 150 - 400 K/uL 8(LL) 35(L) 23(LL)    CMP Latest Ref Rng & Units 09/24/2017 09/23/2017 09/22/2017  Glucose 65 - 99 mg/dL 240(H) 194(H) 242(H)  BUN 6 - 20 mg/dL 22(H) 22(H) 35(H)  Creatinine 0.44 - 1.00 mg/dL 0.84 0.83 1.01(H)  Sodium 135 - 145 mmol/L 139 141 141  Potassium 3.5 - 5.1 mmol/L 3.1(L) 3.4(L) 3.6  Chloride 101 - 111 mmol/L 99(L) 102 102  CO2 22 - 32 mmol/L '30 30 28  '$ Calcium 8.9 - 10.3 mg/dL 7.3(L) 7.3(L) 7.3(L)  Total Protein 6.5 - 8.1 g/dL 5.7(L) 6.1(L) 6.2(L)  Total Bilirubin 0.3 - 1.2 mg/dL 2.7(H) 2.3(H)  1.7(H)  Alkaline Phos 38 - 126 U/L 67 63 56  AST 15 - 41 U/L 68(H) 52(H) 50(H)  ALT 14 - 54 U/L 58(H) 47 41    Component     Latest Ref Rng & Units 09/24/2017  LDH     98 - 192 U/L 650 (H)  Uric Acid, Serum     2.3 - 6.6 mg/dL 2.7      RADIOGRAPHIC STUDIES: I have personally reviewed the radiological images as listed and  agreed with the findings in the report. Dg Chest 2 View  Result Date: 09/18/2017 CLINICAL DATA:  Generalized weakness EXAM: CHEST - 2 VIEW COMPARISON:  03/02/2015 FINDINGS: The lungs are well inflated. The cardiomediastinal contours are normal. Hazy opacities in the right lung base are unchanged. There is no pleural effusion or pneumothorax. IMPRESSION: No active cardiopulmonary disease. Electronically Signed   By: Ulyses Jarred M.D.   On: 09/19/2017 15:25   Ct Chest Wo Contrast  Result Date: 09/19/2017 CLINICAL DATA:  Generalized weakness and fatigue. Fever, anemia. AML. EXAM: CT CHEST WITHOUT CONTRAST TECHNIQUE: Multidetector CT imaging of the chest was performed following the standard protocol without IV contrast. COMPARISON:  None. FINDINGS: Cardiovascular: Right PICC tip terminates junction at the SVC. Atherosclerotic calcification of the arterial vasculature, including coronary arteries. Pulmonary arteries and heart are enlarged. No pericardial effusion. Mediastinum/Nodes: Thyroid is heterogeneous. Mediastinal lymph nodes measure up to 10 mm in the low right paratracheal station. Hilar regions are difficult to evaluate without IV contrast. Subpectoral and axillary lymph nodes are not enlarged by CT size criteria. Esophagus is grossly unremarkable. Lungs/Pleura: Image quality is rather degraded by respiratory motion. There is perihilar peribronchovascular ground-glass and consolidation with peripheral sparing. Septal thickening is not an overwhelming feature. No pleural fluid. Airway is unremarkable. Upper Abdomen: Visualized portions of the liver, adrenal glands, kidneys,  spleen, pancreas, stomach and bowel are grossly unremarkable. Musculoskeletal: Degenerative changes in the spine. No worrisome lytic or sclerotic lesions. IMPRESSION: 1. Perihilar peribronchovascular ground-glass and consolidation with peripheral sparing. Findings may be due to leukostasis related to AML, pulmonary hemorrhage or pulmonary edema. 2. Borderline enlarged mediastinal lymph nodes. 3. Aortic atherosclerosis (ICD10-170.0). Coronary artery calcification. 4. Enlarged pulmonary arteries, indicative of pulmonary arterial hypertension. Electronically Signed   By: Lorin Picket M.D.   On: 09/19/2017 16:56   Mr Sacrum Si Joints Wo Contrast  Result Date: 09/13/2017 CLINICAL DATA:  Osteomyelitis of the sacrum suspected. Possible history of lymphoma or leukemia. Pancytopenia. EXAM: MRI PELVIS WITHOUT CONTRAST TECHNIQUE: Multiplanar multisequence MR imaging of the pelvis was performed. No intravenous contrast was administered. COMPARISON:  04/26/2007 pelvis MRI FINDINGS: Urinary Tract: Urinary bladder is unremarkable. No evidence of distal hydroureter. Bowel:  Unremarkable visualized pelvic bowel loops. Vascular/Lymphatic: No pathologically enlarged lymph nodes. No significant vascular abnormality seen. Reproductive:  Hysterectomy.  No adnexal mass. Other:  No free fluid. Musculoskeletal: Patchy heterogeneous marrow pattern of the bony pelvis and sacrum. Slight marrow edema of the right iliac bone posteriorly may reflect stigmata of recent marrow biopsy. Redemonstration of degenerative joint space narrowing of both hips minimal spurring at the femoral head-neck junction bilaterally. There is lower lumbar degenerative disc and facet arthropathy of the included L4-5 and L5-S1 levels. Sacroiliac joints are maintained bilaterally. Pubic symphysis is intact. No marrow signal abnormality suspicious for acute fracture, frank bone destruction or osteomyelitis. Edema of the left obturator internus and pectineus muscles  compatible with muscle strain. IMPRESSION: 1. New heterogeneous ill-defined marrow pattern of the included pelvis and sacrum that may reflect and infiltrative marrow abnormality such as leukemia or lymphoma. 2. Mild marrow edema involving the right iliac bone would be in keeping with the patient's recent bone marrow biopsy site. 3. No conclusive evidence for osteomyelitis. 4. Left pectineus and obturator internus muscle strains. Electronically Signed   By: Ashley Royalty M.D.   On: 09/13/2017 19:02   Ct Biopsy  Result Date: 09/07/2017 INDICATION: Lymphoma versus leukemia, pancytopenia EXAM: CT GUIDED RIGHT ILIAC BONE MARROW ASPIRATION AND CORE BIOPSY  Date:  09/07/2017 09/07/2017 12:00 pm Radiologist:  M. Daryll Brod, MD Guidance:  CT FLUOROSCOPY TIME:  Fluoroscopy Time: None. MEDICATIONS: 1% lidocaine local ANESTHESIA/SEDATION: 0.5 mg IV Versed; 25 mcg IV Fentanyl Moderate Sedation Time:  10 minutes The patient was continuously monitored during the procedure by the interventional radiology nurse under my direct supervision. CONTRAST:  None. COMPLICATIONS: None PROCEDURE: Informed consent was obtained from the patient following explanation of the procedure, risks, benefits and alternatives. The patient understands, agrees and consents for the procedure. All questions were addressed. A time out was performed. The patient was positioned prone and non-contrast localization CT was performed of the pelvis to demonstrate the iliac marrow spaces. Maximal barrier sterile technique utilized including caps, mask, sterile gowns, sterile gloves, large sterile drape, hand hygiene, and Betadine prep. Under sterile conditions and local anesthesia, an 11 gauge coaxial bone biopsy needle was advanced into the right iliac marrow space. Needle position was confirmed with CT imaging. Initially, bone marrow aspiration was performed. Next, the 11 gauge outer cannula was utilized to obtain a right iliac bone marrow core biopsy. Needle was  removed. Hemostasis was obtained with compression. The patient tolerated the procedure well. Samples were prepared with the cytotechnologist. No immediate complications. IMPRESSION: CT guided right iliac bone marrow aspiration and core biopsy. Electronically Signed   By: Jerilynn Mages.  Shick M.D.   On: 09/07/2017 12:23   Dg Chest Port 1 View  Result Date: 09/21/2017 CLINICAL DATA:  Shortness of breath and fever. Acute myeloid leukemia, atrial fibrillation. EXAM: PORTABLE CHEST 1 VIEW COMPARISON:  Chest x-ray of September 17, 2017 and chest CT scan of September 19, 2017. FINDINGS: The lungs are adequately inflated. The new confluent airspace opacities are present greatest on the left. There is no pleural effusion or pneumothorax. The heart is enlarged. The pulmonary vascularity is indistinct but definite cephalization is not observed. There is calcification in the wall of the thoracic aorta. The right-sided PICC line tip projects over the midportion of the SVC. IMPRESSION: Interval development of airspace opacities greatest on the left most compatible with pneumonia. Underlying low-grade CHF with stable cardiomegaly. Thoracic aortic atherosclerosis. Electronically Signed   By: David  Martinique M.D.   On: 09/21/2017 08:20   Dg Chest Port 1 View  Result Date: 09/17/2017 CLINICAL DATA:  Fever EXAM: PORTABLE CHEST 1 VIEW COMPARISON:  09/12/2017 FINDINGS: There is cardiomegaly. Vascular congestion. Bilateral patchy airspace opacities, left greater than right. This could reflect asymmetric edema or infection. No effusions. No acute bony abnormality. Right PICC line is in place with the tip at the cavoatrial junction. IMPRESSION: Patchy bilateral airspace disease, left greater than right could reflect asymmetric edema or infection. Electronically Signed   By: Rolm Baptise M.D.   On: 09/17/2017 09:38   Dg Chest Port 1 View  Result Date: 09/12/2017 CLINICAL DATA:  83 year old female with a history of fever EXAM: PORTABLE CHEST 1 VIEW  COMPARISON:  09/04/2017 FINDINGS: Cardiomediastinal silhouette unchanged in size and contour. No evidence of central vascular congestion. No pneumothorax or pleural effusion. No confluent airspace disease. No acute displaced fracture. IMPRESSION: Negative for acute cardiopulmonary disease Electronically Signed   By: Corrie Mckusick D.O.   On: 09/12/2017 08:30   Dg Shoulder Right Port  Result Date: 09/20/2017 CLINICAL DATA:  Right shoulder pain with difficulty raising the arm. No known injury. EXAM: PORTABLE RIGHT SHOULDER COMPARISON:  Limited views of the right shoulder from a scout radiograph of the CT scan of the chest of September 19, 2017  FINDINGS: The bones are subjectively adequately mineralized. The glenohumeral joint space is grossly normal. There is moderate narrowing of the AC joint. The subacromial subdeltoid space is normal. There is a small amount of calcification in the region of the insertion of the rotator cuff on the greater tuberosity of the humerus. The observed portions of the right lung exhibit increased interstitial and patchy airspace opacities. IMPRESSION: Degenerative change of the right shoulder centered on the [facility] joint. No acute bony abnormality. Electronically Signed   By: David  Martinique M.D.   On: 09/20/2017 09:04   Ct Bone Marrow Biopsy & Aspiration  Result Date: 09/07/2017 INDICATION: Lymphoma versus leukemia, pancytopenia EXAM: CT GUIDED RIGHT ILIAC BONE MARROW ASPIRATION AND CORE BIOPSY Date:  09/07/2017 09/07/2017 12:00 pm Radiologist:  Jerilynn Mages. Daryll Brod, MD Guidance:  CT FLUOROSCOPY TIME:  Fluoroscopy Time: None. MEDICATIONS: 1% lidocaine local ANESTHESIA/SEDATION: 0.5 mg IV Versed; 25 mcg IV Fentanyl Moderate Sedation Time:  10 minutes The patient was continuously monitored during the procedure by the interventional radiology nurse under my direct supervision. CONTRAST:  None. COMPLICATIONS: None PROCEDURE: Informed consent was obtained from the patient following explanation of the  procedure, risks, benefits and alternatives. The patient understands, agrees and consents for the procedure. All questions were addressed. A time out was performed. The patient was positioned prone and non-contrast localization CT was performed of the pelvis to demonstrate the iliac marrow spaces. Maximal barrier sterile technique utilized including caps, mask, sterile gowns, sterile gloves, large sterile drape, hand hygiene, and Betadine prep. Under sterile conditions and local anesthesia, an 11 gauge coaxial bone biopsy needle was advanced into the right iliac marrow space. Needle position was confirmed with CT imaging. Initially, bone marrow aspiration was performed. Next, the 11 gauge outer cannula was utilized to obtain a right iliac bone marrow core biopsy. Needle was removed. Hemostasis was obtained with compression. The patient tolerated the procedure well. Samples were prepared with the cytotechnologist. No immediate complications. IMPRESSION: CT guided right iliac bone marrow aspiration and core biopsy. Electronically Signed   By: Jerilynn Mages.  Shick M.D.   On: 09/07/2017 12:23   Ir Picc Placement Right >5 Yrs Inc Img Guide  Result Date: 09/14/2017 INDICATION: 83 year old female referred for PICC for AML. EXAM: PICC LINE PLACEMENT WITH ULTRASOUND AND FLUOROSCOPIC GUIDANCE MEDICATIONS: None ANESTHESIA/SEDATION: None FLUOROSCOPY TIME:  Fluoroscopy Time: 0 minutes 12 seconds (0.9 mGy). COMPLICATIONS: None PROCEDURE: Informed written consent was obtained from the patient after a thorough discussion of the procedural risks, benefits and alternatives. All questions were addressed. Maximal Sterile Barrier Technique was utilized including caps, mask, sterile gowns, sterile gloves, sterile drape, hand hygiene and skin antiseptic. A timeout was performed prior to the initiation of the procedure. Patient was position in the supine position on the fluoroscopy table with the right arm abducted 90 degrees. Ultrasound survey  of the upper extremity was performed with images stored and sent to PACs. The right basilic vein was selected for access. Once the patient was prepped and draped in the usual sterile fashion, the skin and subcutaneous tissues were generously infiltrated with 1% lidocaine for local anesthesia. A micropuncture access kit was then used to access the targeted vein. Wire was passed centrally, confirmed to be within the venous system under fluoroscopy. A small stab incision was made with an 11 blade scalpel and the sheath was then placed over the wire. Estimated length of the catheter was then performed with the indwelling wire. Catheter was amputated at 37 cm length and placed with  coaxial wire through the peel-away. Double-lumen, power injectable PICC in the basilic vein. Tip confirmed at the cavoatrial junction, and the catheter is ready for use. Stat lock was placed. Patient tolerated the procedure well and remained hemodynamically stable throughout. No complications were encountered and no significant blood loss was encountered. IMPRESSION: Status post right upper extremity PICC.  Catheter ready for use. Signed, Dulcy Fanny. Dellia Nims, RPVI [facility] Electronically Signed   By: Corrie Mckusick D.O.   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 Received 1st cycle of dacogen 6/12-6/17 2) Pancytopenia due to AML. Anticpation additional nadir from dacogen around D10-14 3) Oral thrush - on fluconazole 4) Afib with RVR 5) s/p fever with empiric abx - no fevers in 24h PLAN -on dacogen per Dr Marin Olp. -appreciate help from hospitalist team -continue fluconazole prophylaxis, if neutropenia add Acyclovir for prophylaxis -daily CBC /diff/platelet, bmp -transfuse CMV neg, irradiated blood products only -transfuse platelet if <10K or actively bleeding -order 1 apheresis unit for  today -transfuse hgb for <8 -rpt pan culture if febrile -overall grave prognosis. Palliative care following.  6) Hypokalemia - K 3.1 -  mx per hospitalist  7) abnormal LFTs ?related to dacogen  I spent 73mnutes counseling the patient face to face. The total time spent in the appointment was 35 minutes and more than 50% was on counseling and direct patient cares.    GSullivan LoneMD MGothamAAHIVMS SPenn State Hershey Rehabilitation HospitalCNix Health Care SystemHematology/Oncology Physician CCent[facility] (Office):       [phone](Work cell):  [phone](Fax):           [phone] 09/24/2017 10:22 AM

## 2017-09-24 NOTE — Progress Notes (Signed)
CRITICAL VALUE ALERT  Critical Value:  PLT 8  Date & Time Notied:  09/24/2017   Provider Notified: Dr Rodena Piety  Orders Received/Actions taken:

## 2017-09-25 ENCOUNTER — Inpatient Hospital Stay (HOSPITAL_COMMUNITY): Payer: Medicare Other

## 2017-09-25 DIAGNOSIS — I482 Chronic atrial fibrillation: Secondary | ICD-10-CM

## 2017-09-25 DIAGNOSIS — Z803 Family history of malignant neoplasm of breast: Secondary | ICD-10-CM

## 2017-09-25 DIAGNOSIS — R945 Abnormal results of liver function studies: Secondary | ICD-10-CM

## 2017-09-25 DIAGNOSIS — Z886 Allergy status to analgesic agent status: Secondary | ICD-10-CM

## 2017-09-25 LAB — URINALYSIS, ROUTINE W REFLEX MICROSCOPIC
Bilirubin Urine: NEGATIVE
GLUCOSE, UA: 150 mg/dL — AB
KETONES UR: 5 mg/dL — AB
LEUKOCYTES UA: NEGATIVE
NITRITE: NEGATIVE
PH: 6 (ref 5.0–8.0)
Protein, ur: 30 mg/dL — AB
Specific Gravity, Urine: 1.013 (ref 1.005–1.030)

## 2017-09-25 LAB — COMPREHENSIVE METABOLIC PANEL
ALBUMIN: 1.9 g/dL — AB (ref 3.5–5.0)
ALT: 49 U/L (ref 14–54)
ANION GAP: 10 (ref 5–15)
AST: 46 U/L — ABNORMAL HIGH (ref 15–41)
Alkaline Phosphatase: 65 U/L (ref 38–126)
BUN: 28 mg/dL — ABNORMAL HIGH (ref 6–20)
CO2: 32 mmol/L (ref 22–32)
Calcium: 7.7 mg/dL — ABNORMAL LOW (ref 8.9–10.3)
Chloride: 103 mmol/L (ref 101–111)
Creatinine, Ser: 0.76 mg/dL (ref 0.44–1.00)
GFR calc non Af Amer: >60 mL/min (ref >60)
GLUCOSE: 178 mg/dL — AB (ref 65–99)
POTASSIUM: 3 mmol/L — AB (ref 3.5–5.1)
SODIUM: 145 mmol/L (ref 135–145)
Total Bilirubin: 2.1 mg/dL — ABNORMAL HIGH (ref 0.3–1.2)
Total Protein: 5.7 g/dL — ABNORMAL LOW (ref 6.5–8.1)

## 2017-09-25 LAB — CBC WITH DIFFERENTIAL/PLATELET
BASOS ABS: 0 10*3/uL (ref 0.0–0.1)
Basophils Relative: 0 %
EOS PCT: 0 %
Eosinophils Absolute: 0 10*3/uL (ref 0.0–0.7)
HCT: 26.5 % — ABNORMAL LOW (ref 36.0–46.0)
HEMOGLOBIN: 8.5 g/dL — AB (ref 12.0–15.0)
LYMPHS ABS: 2.3 10*3/uL (ref 0.7–4.0)
Lymphocytes Relative: 32 %
MCH: 30.5 pg (ref 26.0–34.0)
MCHC: 32.1 g/dL (ref 30.0–36.0)
MCV: 95 fL (ref 78.0–100.0)
MONO ABS: 4.1 10*3/uL — AB (ref 0.1–1.0)
MONOS PCT: 56 %
NEUTROS PCT: 12 %
Neutro Abs: 0.9 10*3/uL — ABNORMAL LOW (ref 1.7–7.7)
Platelets: 55 10*3/uL — ABNORMAL LOW (ref 150–400)
RBC: 2.79 MIL/uL — AB (ref 3.87–5.11)
RDW: 17.8 % — ABNORMAL HIGH (ref 11.5–15.5)
WBC: 7.3 10*3/uL (ref 4.0–10.5)

## 2017-09-25 LAB — URIC ACID: URIC ACID, SERUM: 3.1 mg/dL (ref 2.3–6.6)

## 2017-09-25 LAB — PHOSPHORUS: Phosphorus: 2.7 mg/dL (ref 2.5–4.6)

## 2017-09-25 LAB — LACTATE DEHYDROGENASE: LDH: 563 U/L — AB (ref 98–192)

## 2017-09-25 MED ORDER — IBUPROFEN 200 MG PO TABS
400.0000 mg | ORAL_TABLET | Freq: Once | ORAL | Status: AC
  Administered 2017-09-25: 400 mg via ORAL
  Filled 2017-09-25: qty 2

## 2017-09-25 MED ORDER — POTASSIUM CHLORIDE CRYS ER 20 MEQ PO TBCR: 40.0000 meq | EXTENDED_RELEASE_TABLET | Freq: Once | ORAL | Status: DC

## 2017-09-25 MED ORDER — POTASSIUM CHLORIDE CRYS ER 20 MEQ PO TBCR
40.0000 meq | EXTENDED_RELEASE_TABLET | ORAL | Status: AC
  Administered 2017-09-25 (×2): 40 meq via ORAL
  Filled 2017-09-25 (×2): qty 2

## 2017-09-25 NOTE — Progress Notes (Signed)
In and Out cath performed to collect UA and culture.

## 2017-09-25 NOTE — Progress Notes (Signed)
Marland Kitchen   HEMATOLOGY/ONCOLOGY INPATIENT PROGRESS NOTE  Date of Service: 09/25/2017  Inpatient Attending: .Georgette Shell, MD   SUBJECTIVE  Patient resting comfortably in bed. Platelet much improved s/p platelet transfusion yesterday. No issues with bleeding, fevers, CP or shortness of breath. Resting comfortably in bed.   OBJECTIVE:  PHYSICAL EXAMINATION: . Vitals:   09/24/17 1754 09/24/17 1829 09/24/17 2000 09/25/17 0536  BP: 96/70 (!) 104/57 106/63 112/65  Pulse: 91 91 81 80  Resp: '16 18 12 18  '$ Temp: 97.8 F (36.6 C) 99.6 F (37.6 C) 97.8 F (36.6 C) 97.9 F (36.6 C)  TempSrc: Oral Oral Oral Oral  SpO2: 100% 99% 100% 100%  Weight:      Height:       Filed Weights   09/22/17 0413 09/23/17 0424 09/24/17 0452  Weight: 152 lb 12.5 oz (69.3 kg) 164 lb 3.9 oz (74.5 kg) 162 lb 14.7 oz (73.9 kg)   .Body mass index is 27.97 kg/m. [facility] Kitchen GENERAL:alert, in no acute distress and comfortable SKIN: no acute rashe EYES: conjunctiva are pink and non-injected, sclera anicteric OROPHARYNX: MMM NECK: supple, no JVD LYMPH:  no palpable lymphadenopathy in the cervical, axillary or inguinal regions LUNGS: clear to auscultation b/l with normal respiratory effort HEART: regular rate & rhythm ABDOMEN:  normoactive bowel sounds , non tender Extremity: no pedal edema PSYCH: alert & oriented x 3 with fluent speech NEURO: no focal motor/sensory deficits   MEDICAL HISTORY:  Past Medical History:  Diagnosis Date  . AML (acute myeloid leukemia) ([place]) 09/12/2017  . Atrial fibrillation ([place]) 09/15/2017  . Goals of care, counseling/discussion 09/12/2017  . High cholesterol   . Hypertension   . Stroke ([place])   . Thrush 09/22/2017    SURGICAL HISTORY: Past Surgical History:  Procedure Laterality Date  . ABDOMINAL HYSTERECTOMY    . CATARACT EXTRACTION     bilateral  . CESAREAN SECTION      SOCIAL HISTORY: Social History   Socioeconomic History  . Marital status: Single    Spouse  name: Not on file  . Number of children: Not on file  . Years of education: Not on file  . Highest education level: Not on file  Occupational History  . Not on file  Social Needs  . Financial resource strain: Not on file  . Food insecurity:    Worry: Not on file    Inability: Not on file  . Transportation needs:    Medical: Not on file    Non-medical: Not on file  Tobacco Use  . Smoking status: Never Smoker  . Smokeless tobacco: Former Systems developer    Types: Chew  Substance and Sexual Activity  . Alcohol use: No  . Drug use: No  . Sexual activity: Not on file  Lifestyle  . Physical activity:    Days per week: Not on file    Minutes per session: Not on file  . Stress: Not on file  Relationships  . Social connections:    Talks on phone: Not on file    Gets together: Not on file    Attends religious service: Not on file    Active member of club or organization: Not on file    Attends meetings of clubs or organizations: Not on file    Relationship status: Not on file  . Intimate partner violence:    Fear of current or ex partner: Not on file    Emotionally abused: Not on file    Physically abused: Not  on file    Forced sexual activity: Not on file  Other Topics Concern  . Not on file  Social History Narrative  . Not on file    FAMILY HISTORY: Family History  Problem Relation Age of Onset  . Heart attack Mother   . Pneumonia Father   . Parkinsonism Father   . Breast cancer Sister     ALLERGIES:  is allergic to percocet [oxycodone-acetaminophen].  MEDICATIONS:  Scheduled Meds: . collagenase   Topical Daily  . diltiazem  60 mg Oral Q12H  . feeding supplement (ENSURE ENLIVE)  237 mL Oral TID BM  . ketorolac  1 drop Both Eyes QID  . lidocaine  1 patch Transdermal QAC breakfast  . mouth rinse  15 mL Mouth Rinse BID  . multivitamin with minerals  1 tablet Oral Daily  . nutrition supplement (JUVEN)  1 packet Oral BID BM  . nystatin  5 mL Oral Q8H  . polyethylene glycol   17 g Oral BID  . potassium chloride  40 mEq Oral Q4H  . prednisoLONE acetate  1 drop Both Eyes QID  . sodium chloride flush  10-40 mL Intracatheter Q12H   Continuous Infusions: . sodium chloride    . sodium chloride 50 mL/hr at 09/24/17 1254  . sodium chloride    . fluconazole (DIFLUCAN) IV Stopped (09/24/17 1355)   PRN Meds:.acetaminophen **OR** acetaminophen, alteplase, heparin lock flush, heparin lock flush, heparin lock flush, heparin lock flush, HYDROcodone-acetaminophen, labetalol, ondansetron (ZOFRAN) IV, sodium chloride flush, sodium chloride flush, sodium chloride flush, sodium chloride flush  REVIEW OF SYSTEMS:    10 Point review of Systems was done is negative except as noted above.   LABORATORY DATA:  I have reviewed the data as listed  . CBC Latest Ref Rng & Units 09/25/2017 09/24/2017 09/23/2017  WBC 4.0 - 10.5 K/uL 7.3 11.6(H) 11.5(H)  Hemoglobin 12.0 - 15.0 g/dL 8.5(L) 8.9(L) 9.3(L)  Hematocrit 36.0 - 46.0 % 26.5(L) 27.5(L) 28.7(L)  Platelets 150 - 400 K/uL 55(L) 8(LL) 35(L)    CMP Latest Ref Rng & Units 09/25/2017 09/24/2017 09/23/2017  Glucose 65 - 99 mg/dL 178(H) 240(H) 194(H)  BUN 6 - 20 mg/dL 28(H) 22(H) 22(H)  Creatinine 0.44 - 1.00 mg/dL 0.76 0.84 0.83  Sodium 135 - 145 mmol/L 145 139 141  Potassium 3.5 - 5.1 mmol/L 3.0(L) 3.1(L) 3.4(L)  Chloride 101 - 111 mmol/L 103 99(L) 102  CO2 22 - 32 mmol/L 32 30 30  Calcium 8.9 - 10.3 mg/dL 7.7(L) 7.3(L) 7.3(L)  Total Protein 6.5 - 8.1 g/dL 5.7(L) 5.7(L) 6.1(L)  Total Bilirubin 0.3 - 1.2 mg/dL 2.1(H) 2.7(H) 2.3(H)  Alkaline Phos 38 - 126 U/L 65 67 63  AST 15 - 41 U/L 46(H) 68(H) 52(H)  ALT 14 - 54 U/L 49 58(H) 47     RADIOGRAPHIC STUDIES: I have personally reviewed the radiological images as listed and agreed with the findings in the report. Dg Chest 2 View  Result Date: 09/21/2017 CLINICAL DATA:  Generalized weakness EXAM: CHEST - 2 VIEW COMPARISON:  03/02/2015 FINDINGS: The lungs are well inflated. The  cardiomediastinal contours are normal. Hazy opacities in the right lung base are unchanged. There is no pleural effusion or pneumothorax. IMPRESSION: No active cardiopulmonary disease. Electronically Signed   By: Ulyses Jarred M.D.   On: 09/29/2017 15:25   Ct Chest Wo Contrast  Result Date: 09/19/2017 CLINICAL DATA:  Generalized weakness and fatigue. Fever, anemia. AML. EXAM: CT CHEST WITHOUT CONTRAST TECHNIQUE: Multidetector CT imaging  of the chest was performed following the standard protocol without IV contrast. COMPARISON:  None. FINDINGS: Cardiovascular: Right PICC tip terminates junction at the SVC. Atherosclerotic calcification of the arterial vasculature, including coronary arteries. Pulmonary arteries and heart are enlarged. No pericardial effusion. Mediastinum/Nodes: Thyroid is heterogeneous. Mediastinal lymph nodes measure up to 10 mm in the low right paratracheal station. Hilar regions are difficult to evaluate without IV contrast. Subpectoral and axillary lymph nodes are not enlarged by CT size criteria. Esophagus is grossly unremarkable. Lungs/Pleura: Image quality is rather degraded by respiratory motion. There is perihilar peribronchovascular ground-glass and consolidation with peripheral sparing. Septal thickening is not an overwhelming feature. No pleural fluid. Airway is unremarkable. Upper Abdomen: Visualized portions of the liver, adrenal glands, kidneys, spleen, pancreas, stomach and bowel are grossly unremarkable. Musculoskeletal: Degenerative changes in the spine. No worrisome lytic or sclerotic lesions. IMPRESSION: 1. Perihilar peribronchovascular ground-glass and consolidation with peripheral sparing. Findings may be due to leukostasis related to AML, pulmonary hemorrhage or pulmonary edema. 2. Borderline enlarged mediastinal lymph nodes. 3. Aortic atherosclerosis (ICD10-170.0). Coronary artery calcification. 4. Enlarged pulmonary arteries, indicative of pulmonary arterial hypertension.  Electronically Signed   By: Lorin Picket M.D.   On: 09/19/2017 16:56   Mr Sacrum Si Joints Wo Contrast  Result Date: 09/13/2017 CLINICAL DATA:  Osteomyelitis of the sacrum suspected. Possible history of lymphoma or leukemia. Pancytopenia. EXAM: MRI PELVIS WITHOUT CONTRAST TECHNIQUE: Multiplanar multisequence MR imaging of the pelvis was performed. No intravenous contrast was administered. COMPARISON:  04/26/2007 pelvis MRI FINDINGS: Urinary Tract: Urinary bladder is unremarkable. No evidence of distal hydroureter. Bowel:  Unremarkable visualized pelvic bowel loops. Vascular/Lymphatic: No pathologically enlarged lymph nodes. No significant vascular abnormality seen. Reproductive:  Hysterectomy.  No adnexal mass. Other:  No free fluid. Musculoskeletal: Patchy heterogeneous marrow pattern of the bony pelvis and sacrum. Slight marrow edema of the right iliac bone posteriorly may reflect stigmata of recent marrow biopsy. Redemonstration of degenerative joint space narrowing of both hips minimal spurring at the femoral head-neck junction bilaterally. There is lower lumbar degenerative disc and facet arthropathy of the included L4-5 and L5-S1 levels. Sacroiliac joints are maintained bilaterally. Pubic symphysis is intact. No marrow signal abnormality suspicious for acute fracture, frank bone destruction or osteomyelitis. Edema of the left obturator internus and pectineus muscles compatible with muscle strain. IMPRESSION: 1. New heterogeneous ill-defined marrow pattern of the included pelvis and sacrum that may reflect and infiltrative marrow abnormality such as leukemia or lymphoma. 2. Mild marrow edema involving the right iliac bone would be in keeping with the patient's recent bone marrow biopsy site. 3. No conclusive evidence for osteomyelitis. 4. Left pectineus and obturator internus muscle strains. Electronically Signed   By: Ashley Royalty M.D.   On: 09/13/2017 19:02   Ct Biopsy  Result Date:  09/07/2017 INDICATION: Lymphoma versus leukemia, pancytopenia EXAM: CT GUIDED RIGHT ILIAC BONE MARROW ASPIRATION AND CORE BIOPSY Date:  09/07/2017 09/07/2017 12:00 pm Radiologist:  Jerilynn Mages. Daryll Brod, MD Guidance:  CT FLUOROSCOPY TIME:  Fluoroscopy Time: None. MEDICATIONS: 1% lidocaine local ANESTHESIA/SEDATION: 0.5 mg IV Versed; 25 mcg IV Fentanyl Moderate Sedation Time:  10 minutes The patient was continuously monitored during the procedure by the interventional radiology nurse under my direct supervision. CONTRAST:  None. COMPLICATIONS: None PROCEDURE: Informed consent was obtained from the patient following explanation of the procedure, risks, benefits and alternatives. The patient understands, agrees and consents for the procedure. All questions were addressed. A time out was performed. The patient was positioned prone and  non-contrast localization CT was performed of the pelvis to demonstrate the iliac marrow spaces. Maximal barrier sterile technique utilized including caps, mask, sterile gowns, sterile gloves, large sterile drape, hand hygiene, and Betadine prep. Under sterile conditions and local anesthesia, an 11 gauge coaxial bone biopsy needle was advanced into the right iliac marrow space. Needle position was confirmed with CT imaging. Initially, bone marrow aspiration was performed. Next, the 11 gauge outer cannula was utilized to obtain a right iliac bone marrow core biopsy. Needle was removed. Hemostasis was obtained with compression. The patient tolerated the procedure well. Samples were prepared with the cytotechnologist. No immediate complications. IMPRESSION: CT guided right iliac bone marrow aspiration and core biopsy. Electronically Signed   By: Jerilynn Mages.  Shick M.D.   On: 09/07/2017 12:23   Dg Chest Port 1 View  Result Date: 09/21/2017 CLINICAL DATA:  Shortness of breath and fever. Acute myeloid leukemia, atrial fibrillation. EXAM: PORTABLE CHEST 1 VIEW COMPARISON:  Chest x-ray of September 17, 2017 and chest  CT scan of September 19, 2017. FINDINGS: The lungs are adequately inflated. The new confluent airspace opacities are present greatest on the left. There is no pleural effusion or pneumothorax. The heart is enlarged. The pulmonary vascularity is indistinct but definite cephalization is not observed. There is calcification in the wall of the thoracic aorta. The right-sided PICC line tip projects over the midportion of the SVC. IMPRESSION: Interval development of airspace opacities greatest on the left most compatible with pneumonia. Underlying low-grade CHF with stable cardiomegaly. Thoracic aortic atherosclerosis. Electronically Signed   By: David  Martinique M.D.   On: 09/21/2017 08:20   Dg Chest Port 1 View  Result Date: 09/17/2017 CLINICAL DATA:  Fever EXAM: PORTABLE CHEST 1 VIEW COMPARISON:  09/12/2017 FINDINGS: There is cardiomegaly. Vascular congestion. Bilateral patchy airspace opacities, left greater than right. This could reflect asymmetric edema or infection. No effusions. No acute bony abnormality. Right PICC line is in place with the tip at the cavoatrial junction. IMPRESSION: Patchy bilateral airspace disease, left greater than right could reflect asymmetric edema or infection. Electronically Signed   By: Rolm Baptise M.D.   On: 09/17/2017 09:38   Dg Chest Port 1 View  Result Date: 09/12/2017 CLINICAL DATA:  83 year old female with a history of fever EXAM: PORTABLE CHEST 1 VIEW COMPARISON:  09/20/2017 FINDINGS: Cardiomediastinal silhouette unchanged in size and contour. No evidence of central vascular congestion. No pneumothorax or pleural effusion. No confluent airspace disease. No acute displaced fracture. IMPRESSION: Negative for acute cardiopulmonary disease Electronically Signed   By: Corrie Mckusick D.O.   On: 09/12/2017 08:30   Dg Shoulder Right Port  Result Date: 09/20/2017 CLINICAL DATA:  Right shoulder pain with difficulty raising the arm. No known injury. EXAM: PORTABLE RIGHT SHOULDER  COMPARISON:  Limited views of the right shoulder from a scout radiograph of the CT scan of the chest of September 19, 2017 FINDINGS: The bones are subjectively adequately mineralized. The glenohumeral joint space is grossly normal. There is moderate narrowing of the AC joint. The subacromial subdeltoid space is normal. There is a small amount of calcification in the region of the insertion of the rotator cuff on the greater tuberosity of the humerus. The observed portions of the right lung exhibit increased interstitial and patchy airspace opacities. IMPRESSION: Degenerative change of the right shoulder centered on the [facility] joint. No acute bony abnormality. Electronically Signed   By: David  Martinique M.D.   On: 09/20/2017 09:04   Ct Bone Marrow Biopsy &  Aspiration  Result Date: 09/07/2017 INDICATION: Lymphoma versus leukemia, pancytopenia EXAM: CT GUIDED RIGHT ILIAC BONE MARROW ASPIRATION AND CORE BIOPSY Date:  09/07/2017 09/07/2017 12:00 pm Radiologist:  Jerilynn Mages. Daryll Brod, MD Guidance:  CT FLUOROSCOPY TIME:  Fluoroscopy Time: None. MEDICATIONS: 1% lidocaine local ANESTHESIA/SEDATION: 0.5 mg IV Versed; 25 mcg IV Fentanyl Moderate Sedation Time:  10 minutes The patient was continuously monitored during the procedure by the interventional radiology nurse under my direct supervision. CONTRAST:  None. COMPLICATIONS: None PROCEDURE: Informed consent was obtained from the patient following explanation of the procedure, risks, benefits and alternatives. The patient understands, agrees and consents for the procedure. All questions were addressed. A time out was performed. The patient was positioned prone and non-contrast localization CT was performed of the pelvis to demonstrate the iliac marrow spaces. Maximal barrier sterile technique utilized including caps, mask, sterile gowns, sterile gloves, large sterile drape, hand hygiene, and Betadine prep. Under sterile conditions and local anesthesia, an 11 gauge coaxial bone biopsy needle  was advanced into the right iliac marrow space. Needle position was confirmed with CT imaging. Initially, bone marrow aspiration was performed. Next, the 11 gauge outer cannula was utilized to obtain a right iliac bone marrow core biopsy. Needle was removed. Hemostasis was obtained with compression. The patient tolerated the procedure well. Samples were prepared with the cytotechnologist. No immediate complications. IMPRESSION: CT guided right iliac bone marrow aspiration and core biopsy. Electronically Signed   By: Jerilynn Mages.  Shick M.D.   On: 09/07/2017 12:23   Ir Picc Placement Right >5 Yrs Inc Img Guide  Result Date: 09/14/2017 INDICATION: 83 year old female referred for PICC for AML. EXAM: PICC LINE PLACEMENT WITH ULTRASOUND AND FLUOROSCOPIC GUIDANCE MEDICATIONS: None ANESTHESIA/SEDATION: None FLUOROSCOPY TIME:  Fluoroscopy Time: 0 minutes 12 seconds (0.9 mGy). COMPLICATIONS: None PROCEDURE: Informed written consent was obtained from the patient after a thorough discussion of the procedural risks, benefits and alternatives. All questions were addressed. Maximal Sterile Barrier Technique was utilized including caps, mask, sterile gowns, sterile gloves, sterile drape, hand hygiene and skin antiseptic. A timeout was performed prior to the initiation of the procedure. Patient was position in the supine position on the fluoroscopy table with the right arm abducted 90 degrees. Ultrasound survey of the upper extremity was performed with images stored and sent to PACs. The right basilic vein was selected for access. Once the patient was prepped and draped in the usual sterile fashion, the skin and subcutaneous tissues were generously infiltrated with 1% lidocaine for local anesthesia. A micropuncture access kit was then used to access the targeted vein. Wire was passed centrally, confirmed to be within the venous system under fluoroscopy. A small stab incision was made with an 11 blade scalpel and the sheath was then  placed over the wire. Estimated length of the catheter was then performed with the indwelling wire. Catheter was amputated at 37 cm length and placed with coaxial wire through the peel-away. Double-lumen, power injectable PICC in the basilic vein. Tip confirmed at the cavoatrial junction, and the catheter is ready for use. Stat lock was placed. Patient tolerated the procedure well and remained hemodynamically stable throughout. No complications were encountered and no significant blood loss was encountered. IMPRESSION: Status post right upper extremity PICC.  Catheter ready for use. Signed, Dulcy Fanny. Dellia Nims, RPVI [facility] Electronically Signed   By: Corrie Mckusick D.O.   On: 09/14/2017 07:35    ASSESSMENT & PLAN:   1) AML with complex cytogenetics  in an elderly female - grave prognosis. Received 1st cycle of dacogen 6/12-6/17 2) Pancytopenia due to AML. Anticpation additional nadir from dacogen around D10-14 3) Oral thrush - on fluconazole 4) Afib with RVR 5) s/p fever with empiric abx - no fevers in 24h 6) thrombocytopenia platelets better with transfusion 8k---> 55k PLAN -labs reviewed today. -daily CBC /diff/platelet, bmp -transfuse CMV neg, irradiated blood products only -transfuse platelet if <10K or actively bleeding -order 1 apheresis unit for today -transfuse hgb for <8 -rpt pan culture if febrile -overall grave prognosis. Palliative care following. -further oncologic mx per dr Jonette Eva  6) Hypokalemia - K 3.1 -  mx per hospitalist  7) abnormal LFTs ?related to dacogen - improved -monitor  Dr Jonette Eva will be continuing to see the patient from tomorrow.    Sullivan Lone MD [place] AAHIVMS [name] Hematology/Oncology Physician [facility]  (Office):       [phone] (Work cell):  [phone] (Fax):           [phone]  09/25/2017 10:17 AM

## 2017-09-25 NOTE — Consult Note (Addendum)
WOC follow-up: Bedside nurse states family members are concerned that Sacrum wound is not making progress.  Refer to previous [facility] progress notes on 6/11 for assessment, measurements, and plan of care. Palliative care consult was pending at that time. Pt has not been made comfort care at this time.  Pt has been receiving Santyl for enzymatic debridement. PT can begin hydrotherapy Q Mon-Sat to assist with removal of nonviable tissue. If this process is painful and pt is unable to tolerate, then it can be discontinued.  This treatment is not available as an outpatient. Please re-consult if further assistance is needed.  Thank-you,  Julien Girt MSN, [place], [place], [place], [place]

## 2017-09-25 NOTE — Progress Notes (Signed)
PT NOTE- WILL INITIATE HYDROTHERPY- BEGINNING 09/26/17. Leah Lewis PT [phone]

## 2017-09-25 NOTE — Clinical Social Work Note (Addendum)
Received call back from pt's daughter [name]. Informed her of CSW role and goal of assisting with DC planning/disposition. Daughter states that she and pt's other daughter Joycelyn Schmid have plans to discuss treatment plan and pt's prognosis with oncology 09/26/17 and states they will contact CSW afterward.  Clinical Social Work Assessment  Patient Details  Name: Leah Lewis MRN: [id number] Date of Birth: [birth year]  Date of referral:  09/25/17               Reason for consult:  Facility Placement                Permission sought to share information with:  Family Supports Permission granted to share information::     Name::     daughters Joycelyn Schmid and Albertina Parr, grandsons Isaiah and Hernandez::     Relationship::     Contact Information:     Housing/Transportation Living arrangements for the past 2 months:  Single Family Home Source of Information:  Medical Team Patient Interpreter Needed:  None Criminal Activity/Legal Involvement Pertinent to Current Situation/Hospitalization:  No - Comment as needed Significant Relationships:  Adult Children, Other Family Members, Community Support Lives with:  Adult Children Do you feel safe going back to the place where you live?  (UTA) Need for family participation in patient care:  Yes (Comment)(pt not oriented)  Care giving concerns:  Pt admitted from home where she resides with her daughter. Has had complicated hospitalization- has AML with poor prognosis.  Had Home Health prior to admission. Has declined in functional status throughout hospitalization.    Social Worker assessment / plan:  CSW consulted to assess potential SNF placement. CSW has been informed of pt's status and medical treatment/prognosis throughout her hospitalization, however have not had direct conversations with pt or family thus far. Left voicemail for daughter Albertina Parr and attempted to reach daughter Joycelyn Schmid- voicemail box not set up.  Per team, family  dynamics and identifying decision maker has been challenging throughout hospitalization.  Pt at this time not oriented/alert enough to discuss DC plans and treatment goals which will affect this. Will continue to attempt to discuss with family members.    Employment status:  Retired Engineer, maintenance (IT)) PT Recommendations:  [place] / Referral to community resources:     Patient/Family's Response to care:  UTA  Patient/Family's Understanding of and Emotional Response to Diagnosis, Current Treatment, and Prognosis:  UTA  Emotional Assessment Appearance:  Appears stated age Attitude/Demeanor/Rapport:  (UTA) Affect (typically observed):  Calm Orientation:  (UTA (oriented to self per chart)) Alcohol / Substance use:  Not Applicable Psych involvement (Current and /or in the community):  No (Comment)  Discharge Needs  Concerns to be addressed:  Discharge Planning Concerns, Decision making concerns Readmission within the last 30 days:  No Current discharge risk:  (assessing) Barriers to Discharge:  Continued Medical Work up   [employer], LCSW 09/25/2017, 9:31 AM  [phone] weekend coverage for [phone]

## 2017-09-25 NOTE — Progress Notes (Signed)
PROGRESS NOTE    Leah Lewis  KZS:[id number] DOB: [birth year] DOA: 09/22/2017 PCP: Wenda Low, MD  Brief Narrative:83 year old female with past medical history of osteoporosis, hyperlipidemia, DVT on Xarelto presented to the emergency department complaining of generalized weakness. 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 During hospital stay patient spiking fevers, being treated with antibiotic but obvious source of infection. Patient developed A. fib with RVR, cardiology consulted and treated with Cardizem. Thrombocytopenia worsened and she was transfused 1 unit of platelets. Per oncology poor prognosis and palliative care has been consulted. Patient has been started on chemotherapy. Heart rate improving.     Assessment & Plan:   Active Problems:   Pressure injury of skin   AML (acute myeloid leukemia) (HCC)   Goals of care, counseling/discussion   AML (acute myeloblastic leukemia) ([place])   Advance care planning   Palliative care by specialist   Fever   Pancytopenia ([place])   Atrial fibrillation with RVR ([place])   Thrush   Thrombocytopenia ([place])  AML -family still undecided on the new chemo at this time. Oncology following.  Platelets up after transfusion. Hemoglobin and WBC count stable.  Will arrange for platelet transfusion. Encourage oral hydration and nutrition. Palliative carefollowing.  Thrombocytopenia/anemiastatus post blood transfusion and platelet transfusion. Secondary to AML No signs of overt bleeding, continue to monitor CBC  New diagnosis A. fib with RVR-improved Cardiology consulted andinitially treated withCardizem drip, she has been transitioned to Cardizem p.o. Rhythm remains in A. fib with goodcontrol. Continue Cardizem at a Lower dose due to soft blood pressure. Not on anticoagulation due to thrombocytopenia and anemia.  HTNsoft decrease Cardizem yesterday.   unstageable  sacral pressure ulcer-patient was seen by wound care 09/13/2017.  This wound was present upon admission.  Continued low air loss mattress and Santyl for now.       DVT prophylaxis scd Code Status full Family Communication: none Disposition Plan: Plan discharge to SNF tomorrow since family have not agreed to the chemo over 1 week  Consultants: Oncology and palliative care Procedures: None Antimicrobials: None  Subjective: Patient is a week followed resting in bed in no acute distress she told me she wants to eat oatmeal this morning and she wants to get out of bed she knows she is in the hospital she denies any shortness of breath cough chest pain abdominal pain nausea vomiting or diarrhea.  Objective: Vitals:   09/24/17 1754 09/24/17 1829 09/24/17 2000 09/25/17 0536  BP: 96/70 (!) 104/57 106/63 112/65  Pulse: 91 91 81 80  Resp: 16 18 12 18   Temp: 97.8 F (36.6 C) 99.6 F (37.6 C) 97.8 F (36.6 C) 97.9 F (36.6 C)  TempSrc: Oral Oral Oral Oral  SpO2: 100% 99% 100% 100%  Weight:      Height:        Intake/Output Summary (Last 24 hours) at 09/25/2017 1013 Last data filed at 09/25/2017 0500 Gross per 24 hour  Intake 2355.33 ml  Output 2000 ml  Net 355.33 ml   Filed Weights   09/22/17 0413 09/23/17 0424 09/24/17 0452  Weight: 69.3 kg (152 lb 12.5 oz) 74.5 kg (164 lb 3.9 oz) 73.9 kg (162 lb 14.7 oz)    Examination:  General exam: Appears calm and comfortable  Respiratory system: Clear to auscultation. Respiratory effort normal. Cardiovascular system: S1 & S2 heard, RRR. No JVD, murmurs, rubs, gallops or clicks. No pedal edema. Gastrointestinal system: Abdomen is nondistended, soft  and nontender. No organomegaly or masses felt. Normal bowel sounds heard. Central nervous system: Alert and oriented. No focal neurological deficits. Extremities: Symmetric 5 x 5 power. Skin: No rashes, lesions or ulcers Psychiatry: Judgement and insight appear normal. Mood & affect  appropriate.     Data Reviewed: I have personally reviewed following labs and imaging studies  CBC: Recent Labs  Lab 09/21/17 0341 09/22/17 0743 09/23/17 0421 09/24/17 0500 09/25/17 0350  WBC 26.0* 12.0* 11.5* 11.6* 7.3  NEUTROABS 11.2* 2.0 1.8 3.0 PENDING  HGB 7.3* 10.0* 9.3* 8.9* 8.5*  HCT 22.7* 30.3* 28.7* 27.5* 26.5*  MCV 94.2 91.8 92.3 93.9 95.0  PLT 17* 23* 35* 8* 55*   Basic Metabolic Panel: Recent Labs  Lab 09/21/17 0341 09/22/17 0743 09/23/17 0421 09/24/17 0500 09/25/17 0350  NA 145 141 141 139 145  K 4.1 3.6 3.4* 3.1* 3.0*  CL 107 102 102 99* 103  CO2 26 28 30 30  32  GLUCOSE 395* 242* 194* 240* 178*  BUN 35* 35* 22* 22* 28*  CREATININE 1.20* 1.01* 0.83 0.84 0.76  CALCIUM 7.5* 7.3* 7.3* 7.3* 7.7*  PHOS 2.6 2.8 2.2* 2.6 2.7   GFR: Estimated Creatinine Clearance: 54.3 mL/min (by C-G formula based on SCr of 0.76 mg/dL). Liver Function Tests: Recent Labs  Lab 09/21/17 0341 09/22/17 0743 09/23/17 0421 09/24/17 0500 09/25/17 0350  AST 51* 50* 52* 68* 46*  ALT 42 41 47 58* 49  ALKPHOS 57 56 63 67 65  BILITOT 1.7* 1.7* 2.3* 2.7* 2.1*  PROT 5.9* 6.2* 6.1* 5.7* 5.7*  ALBUMIN 2.0* 2.1* 2.1* 1.8* 1.9*   No results for input(s): LIPASE, AMYLASE in the last 168 hours. No results for input(s): AMMONIA in the last 168 hours. Coagulation Profile: No results for input(s): INR, PROTIME in the last 168 hours. Cardiac Enzymes: No results for input(s): CKTOTAL, CKMB, CKMBINDEX, TROPONINI in the last 168 hours. BNP (last 3 results) No results for input(s): PROBNP in the last 8760 hours. HbA1C: No results for input(s): HGBA1C in the last 72 hours. CBG: Recent Labs  Lab 09/21/17 0656 09/22/17 0737  GLUCAP 333* 216*   Lipid Profile: No results for input(s): CHOL, HDL, LDLCALC, TRIG, CHOLHDL, LDLDIRECT in the last 72 hours. Thyroid Function Tests: No results for input(s): TSH, T4TOTAL, FREET4, T3FREE, THYROIDAB in the last 72 hours. Anemia Panel: No results  for input(s): VITAMINB12, FOLATE, FERRITIN, TIBC, IRON, RETICCTPCT in the last 72 hours. Sepsis Labs: Recent Labs  Lab 09/18/17 1220 09/19/17 0850  LATICACIDVEN 2.7* 1.5    Recent Results (from the past 240 hour(s))  Culture, blood (routine x 2)     Status: None   Collection Time: 09/17/17  7:30 AM  Result Value Ref Range Status   Specimen Description   Final    BLOOD LEFT HAND Performed at [facility] [address] [place], [place] [address]    Special Requests   Final    BOTTLES DRAWN AEROBIC ONLY Blood Culture adequate volume Performed at [facility] [address] [place], [place] [address]    Culture   Final    NO GROWTH 5 DAYS Performed at [facility], [place] [address] [place], [place] [address]    Report Status 09/22/2017 FINAL  Final  Culture, blood (routine x 2)     Status: None   Collection Time: 09/17/17  7:38 AM  Result Value Ref Range Status   Specimen Description   Final    BLOOD LEFT  HAND Performed at [facility], [place] [address] [place], [place] [address]    Special Requests   Final    BOTTLES DRAWN AEROBIC ONLY Blood Culture adequate volume Performed at [facility] [address] [place], [place] [address]    Culture   Final    NO GROWTH 5 DAYS Performed at [facility], [place] [address] [place], [place] [address]    Report Status 09/22/2017 FINAL  Final         Radiology Studies: No results found.      Scheduled Meds: . collagenase   Topical Daily  . diltiazem  60 mg Oral Q12H  . feeding supplement (ENSURE ENLIVE)  237 mL Oral TID BM  . ketorolac  1 drop Both Eyes QID  . lidocaine  1 patch Transdermal QAC breakfast  . mouth rinse  15 mL Mouth Rinse BID  . multivitamin with minerals  1 tablet Oral Daily  . nutrition supplement (JUVEN)  1 packet Oral BID BM  . nystatin  5 mL Oral Q8H  . polyethylene glycol  17 g Oral BID  . potassium  chloride  40 mEq Oral Q4H  . prednisoLONE acetate  1 drop Both Eyes QID  . sodium chloride flush  10-40 mL Intracatheter Q12H   Continuous Infusions: . sodium chloride    . sodium chloride 50 mL/hr at 09/24/17 1254  . sodium chloride    . fluconazole (DIFLUCAN) IV Stopped (09/24/17 1355)     LOS: 19 days       Georgette Shell, MD [facility] Pager [phone]  If 7PM-7AM, please contact night-coverage [email or web address] Password [email or web address] 09/25/2017, 10:14 AM

## 2017-09-26 ENCOUNTER — Telehealth: Payer: Self-pay | Admitting: *Deleted

## 2017-09-26 LAB — CBC WITH DIFFERENTIAL/PLATELET
BASOS PCT: 0 %
Basophils Absolute: 0 10*3/uL (ref 0.0–0.1)
Blasts: 12 %
EOS ABS: 0.1 10*3/uL (ref 0.0–0.7)
Eosinophils Relative: 1 %
HCT: 25.3 % — ABNORMAL LOW (ref 36.0–46.0)
HEMOGLOBIN: 7.9 g/dL — AB (ref 12.0–15.0)
Lymphocytes Relative: 20 %
Lymphs Abs: 1.4 10*3/uL (ref 0.7–4.0)
MCH: 30 pg (ref 26.0–34.0)
MCHC: 31.2 g/dL (ref 30.0–36.0)
MCV: 96.2 fL (ref 78.0–100.0)
METAMYELOCYTES PCT: 3 %
MONOS PCT: 38 %
Monocytes Absolute: 2.7 10*3/uL — ABNORMAL HIGH (ref 0.1–1.0)
Myelocytes: 15 %
NEUTROS ABS: 2.1 10*3/uL (ref 1.7–7.7)
NRBC: 1 /100{WBCs} — AB
Neutrophils Relative %: 11 %
Platelets: 12 10*3/uL — CL (ref 150–400)
RBC: 2.63 MIL/uL — ABNORMAL LOW (ref 3.87–5.11)
RDW: 17.8 % — ABNORMAL HIGH (ref 11.5–15.5)
WBC: 7.1 10*3/uL (ref 4.0–10.5)

## 2017-09-26 LAB — COMPREHENSIVE METABOLIC PANEL
ALBUMIN: 1.7 g/dL — AB (ref 3.5–5.0)
ALK PHOS: 61 U/L (ref 38–126)
ALT: 40 U/L (ref 14–54)
AST: 34 U/L (ref 15–41)
Anion gap: 9 (ref 5–15)
BUN: 37 mg/dL — ABNORMAL HIGH (ref 6–20)
CALCIUM: 7.9 mg/dL — AB (ref 8.9–10.3)
CO2: 29 mmol/L (ref 22–32)
CREATININE: 0.88 mg/dL (ref 0.44–1.00)
Chloride: 108 mmol/L (ref 101–111)
GFR calc non Af Amer: 60 mL/min — ABNORMAL LOW (ref >60)
Glucose, Bld: 195 mg/dL — ABNORMAL HIGH (ref 65–99)
Potassium: 3.8 mmol/L (ref 3.5–5.1)
SODIUM: 146 mmol/L — AB (ref 135–145)
Total Bilirubin: 2.2 mg/dL — ABNORMAL HIGH (ref 0.3–1.2)
Total Protein: 5.4 g/dL — ABNORMAL LOW (ref 6.5–8.1)

## 2017-09-26 LAB — BASIC METABOLIC PANEL
ANION GAP: 11 (ref 5–15)
BUN: 33 mg/dL — AB (ref 6–20)
CO2: 30 mmol/L (ref 22–32)
Calcium: 8.3 mg/dL — ABNORMAL LOW (ref 8.9–10.3)
Chloride: 105 mmol/L (ref 101–111)
Creatinine, Ser: 0.91 mg/dL (ref 0.44–1.00)
GFR, EST NON AFRICAN AMERICAN: 58 mL/min — AB (ref >60)
Glucose, Bld: 374 mg/dL — ABNORMAL HIGH (ref 65–99)
POTASSIUM: 3.8 mmol/L (ref 3.5–5.1)
SODIUM: 146 mmol/L — AB (ref 135–145)

## 2017-09-26 LAB — LACTATE DEHYDROGENASE: LDH: 492 U/L — ABNORMAL HIGH (ref 98–192)

## 2017-09-26 LAB — PREPARE PLATELET PHERESIS: Unit division: 0

## 2017-09-26 LAB — BPAM PLATELET PHERESIS
BLOOD PRODUCT EXPIRATION DATE: 201906242359
ISSUE DATE / TIME: 201906221729
UNIT TYPE AND RH: 6200

## 2017-09-26 LAB — URIC ACID
URIC ACID, SERUM: 3.4 mg/dL (ref 2.3–6.6)
Uric Acid, Serum: 3.5 mg/dL (ref 2.3–6.6)

## 2017-09-26 LAB — PHOSPHORUS
PHOSPHORUS: 2.3 mg/dL — AB (ref 2.5–4.6)
PHOSPHORUS: 2.1 mg/dL — AB (ref 2.5–4.6)

## 2017-09-26 MED ORDER — METOPROLOL TARTRATE 25 MG PO TABS
25.0000 mg | ORAL_TABLET | Freq: Two times a day (BID) | ORAL | Status: DC
  Administered 2017-09-26 – 2017-09-29 (×6): 25 mg via ORAL
  Filled 2017-09-26 (×7): qty 1

## 2017-09-26 MED ORDER — CEFEPIME HCL 2 G IJ SOLR
2.0000 g | Freq: Two times a day (BID) | INTRAMUSCULAR | Status: DC
  Administered 2017-09-26 – 2017-10-03 (×14): 2 g via INTRAVENOUS
  Filled 2017-09-26 (×14): qty 2

## 2017-09-26 MED ORDER — DEXTROSE-NACL 5-0.45 % IV SOLN
INTRAVENOUS | Status: AC
  Administered 2017-09-27: 09:00:00 via INTRAVENOUS

## 2017-09-26 MED ORDER — DEXTROSE-NACL 5-0.45 % IV SOLN
INTRAVENOUS | Status: DC
  Administered 2017-09-26 – 2017-09-27 (×2): via INTRAVENOUS

## 2017-09-26 MED ORDER — VENETOCLAX 100 MG PO TABS
200.0000 mg | ORAL_TABLET | Freq: Every day | ORAL | Status: AC
  Administered 2017-09-29: 200 mg via ORAL
  Filled 2017-09-26: qty 2

## 2017-09-26 MED ORDER — VENETOCLAX 100 MG PO TABS
400.0000 mg | ORAL_TABLET | Freq: Every day | ORAL | Status: DC
  Administered 2017-09-30 – 2017-10-05 (×3): 400 mg via ORAL

## 2017-09-26 MED ORDER — DEXTROSE-NACL 5-0.45 % IV SOLN: INTRAVENOUS | Status: DC

## 2017-09-26 MED ORDER — VENETOCLAX 100 MG PO TABS
100.0000 mg | ORAL_TABLET | Freq: Every day | ORAL | Status: AC
  Administered 2017-09-26 – 2017-09-27 (×2): 100 mg via ORAL

## 2017-09-26 MED ORDER — SODIUM CHLORIDE 0.9 % IV SOLN
1.0000 g | Freq: Two times a day (BID) | INTRAVENOUS | Status: DC
  Administered 2017-09-26: 1 g via INTRAVENOUS
  Filled 2017-09-26: qty 1

## 2017-09-26 MED ORDER — DEXTROSE-NACL 5-0.45 % IV SOLN: INTRAVENOUS | Status: AC

## 2017-09-26 MED ORDER — MIRTAZAPINE 15 MG PO TABS
7.5000 mg | ORAL_TABLET | Freq: Every day | ORAL | Status: DC
  Administered 2017-09-26 – 2017-09-29 (×4): 7.5 mg via ORAL
  Filled 2017-09-26 (×4): qty 1

## 2017-09-26 MED ORDER — DEXTROSE-NACL 5-0.45 % IV SOLN
INTRAVENOUS | Status: DC
  Administered 2017-09-26: 11:00:00 via INTRAVENOUS

## 2017-09-26 NOTE — Progress Notes (Signed)
PT HYDROTHERAPY EVAL AND TREATMENT NOTE  09/26/17 1500  Subjective Assessment  Patient and Family Stated Goals none stated, no family present  Date of Onset  (present on admission)  Prior Treatments dressing changes per nursing  Evaluation and Treatment  Evaluation and Treatment Procedures Explained to Patient/Family Yes  Evaluation and Treatment Procedures Patient unable to consent due to mental status  Pressure Injury 09/26/17 Unstageable - Full thickness tissue loss in which the base of the ulcer is covered by slough (yellow, tan, gray, green or brown) and/or eschar (tan, brown or black) in the wound bed. *PT ONLY* sacral pressure injury  Date First Assessed/Time First Assessed: 09/26/17 1130   Location: Sacrum  Location Orientation: Mid  Staging: Unstageable - Full thickness tissue loss in which the base of the ulcer is covered by slough (yellow, tan, gray, green or brown) and/or esch...  Dressing Type Moist to dry (santyl)  Site / Wound Assessment Brown;Yellow (gray)  % Wound base Red or Granulating 0%  % Wound base Yellow/Fibrinous Exudate 100%  Peri-wound Assessment Erythema (blanchable)  Wound Length (cm) 5 cm  Wound Width (cm) 3.5 cm  Wound Depth (cm) 1.5 cm  Wound Surface Area (cm^2) 17.5 cm^2  Wound Volume (cm^3) 26.25 cm^3  Margins Unattached edges (unapproximated)  Drainage Amount Minimal  Drainage Description Purulent  Treatment Hydrotherapy (Pulse lavage);Packing (Saline gauze) (santyl and alleyn)  Hydrotherapy  Pulsed lavage therapy - wound location sacrum  Pulsed Lavage with Suction (psi) 4 psi  Pulsed Lavage with Suction - Normal Saline Used 1000 mL  Pulsed Lavage Tip Tip with splash shield  Wound Therapy - Assess/Plan/Recommendations  Wound Therapy - Clinical Statement Pt with unstageable pressure injury to sacrum, unable to visualize wound base d/t slough, purulent odor present;  pt will benefit from hydrotherapy to aide in healing and decr bioburden;   Wound  Therapy - Functional Problem List limited mobility  Factors Delaying/Impairing Wound Healing Immobility  Hydrotherapy Plan Dressing change;Patient/family education;Pulsatile lavage with suction;Debridement  Wound Therapy - Frequency 6X / week  Wound Therapy - Follow Up Recommendations Skilled nursing facility  Wound Plan hydrotherapy 6x/wk; caution used with frequent wound checks during pls lavage with light suction/4psi used today d/t pt platelet count low with pt at risk for bleeding;   Wound Therapy Goals - Improve the function of patient's integumentary system by progressing the wound(s) through the phases of wound healing by:  Decrease Necrotic Tissue to 75  Decrease Necrotic Tissue - Progress Goal set today  Increase Granulation Tissue to 25  Increase Granulation Tissue - Progress Goal set today  Goals/treatment plan/discharge plan were made with and agreed upon by patient/family No, Patient unable to participate in goals/treatment/discharge plan and family unavailable  Time For Goal Achievement 2 weeks  Wound Therapy - Potential for Goals Poor  Kenyon Ana, PT Pager: [phone] 09/26/2017

## 2017-09-26 NOTE — Progress Notes (Signed)
Nutrition Follow-up  DOCUMENTATION CODES:   Not applicable  INTERVENTION:    Ensure Enlive po BID, each supplement provides 350 kcal and 20 grams of protein  Magic cup TID with meals, each supplement provides 290 kcal and 9 grams of protein  Recommended placement of small bore feeding tube if full scope of treatment is desired.   NUTRITION DIAGNOSIS:   Increased nutrient needs related to wound healing as evidenced by estimated needs.  Ongoing  GOAL:   Patient will meet greater than or equal to 90% of their needs  Not meeting  MONITOR:   PO intake, Supplement acceptance, Labs, Weight trends, Skin, I & O's  REASON FOR ASSESSMENT:   Malnutrition Screening Tool    ASSESSMENT:   83 y.o. female with medical history significant of hypertension, prior stroke hx of left DVT.  Patient presented to the emergency department complaining of generalized weakness and fatigue.    Pt sleeping upon follow up. Spoke with family at bedside who states pt is not consuming much at meal times. Meal completions charted as 10% for her last two meals. RD observed lunch tray at bedside with only bites of peaches consumed. Ensure was 25% consumed. GOC still pending. If intake does not improve would recommend consideration of nutrition support if full scope of treatment is desired. Will add Magic cup on trays in attempts to maximize calories and protein.   Weight noted to increase 8 lb since last RD visit on 6/17 (154 lb to 162 lb). Could not obtain Nutrition-Focused physical exam at this time due to pt sleeping.   Medications reviewed and include: MVI with minerals, perdnisolone Labs reviewed: Na 146 (H) Phosphorus 2.3 (L)  Diet Order:   Diet Order           Diet regular Room service appropriate? Yes; Fluid consistency: Thin  Diet effective now          EDUCATION NEEDS:   Not appropriate for education at this time  Skin:  Skin Assessment: Skin Integrity Issues: Skin Integrity Issues::  Unstageable Stage II: coccyx Unstageable: full thickness to coccyx  Last BM:  09/25/17  Height:   Ht Readings from Last 1 Encounters:  09/06/17 5\' 4"  (1.626 m)    Weight:   Wt Readings from Last 1 Encounters:  09/24/17 162 lb 14.7 oz (73.9 kg)    Ideal Body Weight:  54.5 kg  BMI:  Body mass index is 27.97 kg/m.  Estimated Nutritional Needs:   Kcal:  2025-2160 (30-32 kcal/kg)  Protein:  108-120 grams (1.6-1.8 grams/kg)  Fluid:  1.7L/day    Mariana Single RD, LDN Clinical Nutrition Pager # [phone]

## 2017-09-26 NOTE — Telephone Encounter (Signed)
Received call from [facility] requesting a second opinion at either [facility] or [facility]. Explained to the patient that this office doesn't coordinate second opinions, but we would provide whatever medical records theses institutions needed when an appointment was made. Leah Lewis states she had the numbers to [facility] and [facility]. I encouraged her to call those locations and try to schedule a second opinion appointment. Explained that records can be shared between these facilities with care everywhere but we would send whatever additional information might be needed. She thanked me for the information and she will proceed with additional opinions.  Dr Marin Olp informed of the above.

## 2017-09-26 NOTE — Progress Notes (Signed)
CRITICAL VALUE ALERT  Critical Value:  Platelets 15,000  Date & Time Notified: 6/24/169 5379  Provider Notified: Silas Sacramento NP   Orders Received/Actions taken: oncology to follow up

## 2017-09-26 NOTE — Progress Notes (Signed)
Leah Lewis is much more alert and talkative this morning.  Her family was with her.  I had a nice talk with them about the use of venetoclax. I explained why Venetoclax was going to be helpful when added to the decitabine.  She is responded nicely to decitabine.  Her temperature had come down.  However, she appeared to have a spike of 103.1 yesterday.  She had a chest x-ray yesterday.  It looks like she has pneumonia on the left side.  I do not see that she is on any antibiotics.  I will go ahead and get her on antibiotics.  I would use Merrem.  Her white cell count has come down quite nicely.  Is now 7.1.  Her platelet count is 12,000.  Her hemoglobin is 7.9.  Her LDH is down to 492.  She now has a air mattress to help with her decubiti.  I think this is a great idea.  She still has the atrial fibrillation.  Her heart is better controlled.  Again, I answered a lot of the questions the family had.  I told him that I would just wanted to offer Leah Lewis the best treatment to try to help get her leukemia and to remission.  I would have to say she has a high risk leukemia given the cytogenetic abnormalities.  I just do not think she is ready to be moved because of this pneumonia.  I think this is a problem.  We will have to be aggressive in trying to treat this.  Hopefully, the Venetoclax will get started today.  [name], MD  1 Timothy 4:10

## 2017-09-26 NOTE — Progress Notes (Signed)
Pharmacy Antibiotic Note  Leah Lewis is a 83 y.o. female admitted on 09/25/2017 with AML getting chemotherapy and already completed a course of antibiotics for possible infected decubitus ulcer.  Now,  Pharmacy has been consulted for Cefepime dosing for febrile neutropenia.  Tm 103.1 WBC 7.1 ANC 2.1 SCr 0.88  Plan:  Cefepime 2g IV q12h  Follow up renal fxn, culture results, and clinical course.  F/u ability to de-escalate antibiotics.   Height: 5\' 4"  (162.6 cm) Weight: 162 lb 14.7 oz (73.9 kg) IBW/kg (Calculated) : 54.7  Temp (24hrs), Avg:100.9 F (38.3 C), Min:97.9 F (36.6 C), Max:103.1 F (39.5 C)  Recent Labs  Lab 09/19/17 0850  09/22/17 0743 09/23/17 0421 09/24/17 0500 09/25/17 0350 09/26/17 0336  WBC  --    < > 12.0* 11.5* 11.6* 7.3 7.1  CREATININE  --    < > 1.01* 0.83 0.84 0.76 0.88  LATICACIDVEN 1.5  --   --   --   --   --   --    < > = values in this interval not displayed.    Estimated Creatinine Clearance: 49.4 mL/min (by C-G formula based on SCr of 0.88 mg/dL).    Allergies  Allergen Reactions  . Percocet [Oxycodone-Acetaminophen] Other (See Comments)    "patient can take" but prefers not to    Antimicrobials this admission: 6/8 cefepime >> 6/14, resumed 6/24 >>  6/10 vancomycin >> 6/14 6/14 meropenem >> 6/18 6/19 fluconazole (thrush) >> 6/24 (DC d/t drug-drug interaction with Venetoclax, continue nystatin alone)  Dose adjustments this admission:   Microbiology results: 6/3 BCx: NGF 6/8 BCx: NGF 6/10 BCx: NGF 6/10 MRSA PCR: negative  6/15 Bcx: NGF 6/23 BCx:    Thank you for allowing pharmacy to be a part of this patient's care.  Gretta Arab PharmD, BCPS Pager [phone] 09/26/2017 7:47 AM

## 2017-09-26 NOTE — Care Management Important Message (Signed)
Important Message  Patient Details  Name: Leah Lewis MRN: [id number] Date of Birth: Dec 10, 1934   Medicare Important Message Given:  Yes    Kerin Salen 09/26/2017, 10:49 AMImportant Message  Patient Details  Name: Leah Lewis MRN: [id number] Date of Birth: 28-Nov-1934   Medicare Important Message Given:  Yes    Kerin Salen 09/26/2017, 10:49 AM

## 2017-09-26 NOTE — Progress Notes (Signed)
PT Cancellation Note  Patient Details Name: Leah Lewis MRN: [id number] DOB: [birth year]   Cancelled Treatment:    Reason Eval/Treat Not Completed: Medical issues which prohibited therapy(low platelets--contraindication to activity/exercise)   [facility] 09/26/2017, 4:23 PM

## 2017-09-26 NOTE — Progress Notes (Signed)
PROGRESS NOTE    Leah Lewis  VHQ:[id number] DOB: [birth year] DOA: 09/08/2017 PCP: Wenda Low, MD   Brief Narrative:83 year old female with past medical history of osteoporosis, hyperlipidemia, DVT on Xarelto presented to the emergency department complaining of generalized weakness. 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 During hospital stay patient spiking fevers, being treated with antibiotic but obvious source of infection. Patient developed A. fib with RVR, cardiology consulted and treated with Cardizem. Thrombocytopenia worsened and she was transfused 1 unit of platelets. Per oncology poor prognosis and palliative care has been consulted. Patient has been started on chemotherapy. Heart rate improving.     Assessment & Plan:   Active Problems:   Pressure injury of skin   AML (acute myeloid leukemia) (HCC)   Goals of care, counseling/discussion   AML (acute myeloblastic leukemia) ([place])   Advance care planning   Palliative care by specialist   Fever   Pancytopenia ([place])   Atrial fibrillation with RVR (HCC)   Thrush   Thrombocytopenia (HCC)   1] AML status post Decitabine.  Patient to be started on venetoclax today.  Her WBC count is normal platelet dropped again to 12,000.  Hemoglobin is 7.9.  Per pharmacy discussion with Dr. Marin Olp he wants to hold off on platelet transfusion today.  She spiked a temp of 103.1 last night and fever work-up was started again.  Her chest x-ray shows changes consistent with pneumonia again.  At baseline her chest x-ray was abnormal.  Patient currently started on cefepime for possible pneumonia.  She remains on 2  L of oxygen without changes.  She denies any cough.  She has not had any further fever.  Blood cultures from yesterday 09/25/2017 are pending.  Patient is status post platelet transfusion and blood transfusion.   Patient with minimal p.o. intake.  When I discussed her  with her this morning about increasing the p.o. intake and that we discussed about placing a tube for nutrition patient adamantly refused that she does not want a tube in her nose or in her stomach for feeding.  Her platelets are too low to do and procedure at this time.  Consider Remeron to increase her appetite.  Her overall prognosis remains grim at this time.  2] atrial fibrillation patient is on Cardizem will change to beta-blocker since  Cardizem will interact with a new chemo.  3] hypertension continue low-dose Lopressor.  4] unstageable sacral pressure ulcer without any improvement-wound therapy has recommended hydrotherapy.  Continue low-air-loss mattress.     DVT prophylaxis:scd  Code Status: Full code Family Communication: None Disposition Plan TBD Consultants:  Oncology, palliative care Procedures: None Antimicrobials cefepime  Subjective: Patient resting in bed in no acute distress does not want to eat breakfast this morning she does not want oatmeal.  Denies any nausea vomiting or diarrhea at this time.   Objective: Vitals:   09/25/17 2102 09/26/17 0057 09/26/17 0558 09/26/17 1437  BP: (!) 88/63 (!) 90/56 96/63 (!) 100/59  Pulse: 86 74  95  Resp: (!) 32  19   Temp: 100.1 F (37.8 C)  97.9 F (36.6 C) 100.3 F (37.9 C)  TempSrc: Oral  Oral Oral  SpO2: 100%   100%  Weight:      Height:        Intake/Output Summary (Last 24 hours) at 09/26/2017 1558 Last data filed at 09/26/2017 1024 Gross per 24 hour  Intake 240 ml  Output 1800  ml  Net -1560 ml   Filed Weights   09/22/17 0413 09/23/17 0424 09/24/17 0452  Weight: 69.3 kg (152 lb 12.5 oz) 74.5 kg (164 lb 3.9 oz) 73.9 kg (162 lb 14.7 oz)    Examination:  General exam: Appears calm and comfortable  Respiratory system: Clear to auscultation. Respiratory effort normal. Cardiovascular system: S1 & S2 heard, RRR. No JVD, murmurs, rubs, gallops or clicks. No pedal edema. Gastrointestinal system: Abdomen is  nondistended, soft and nontender. No organomegaly or masses felt. Normal bowel sounds heard. Central nervous system: Alert and oriented. No focal neurological deficits. Extremities: Symmetric 5 x 5 power. Skin: No rashes, lesions or ulcers Psychiatry: Judgement and insight appear normal. Mood & affect appropriate.     Data Reviewed: I have personally reviewed following labs and imaging studies  CBC: Recent Labs  Lab 09/22/17 0743 09/23/17 0421 09/24/17 0500 09/25/17 0350 09/26/17 0336  WBC 12.0* 11.5* 11.6* 7.3 7.1  NEUTROABS 2.0 1.8 3.0 0.9* 2.1  HGB 10.0* 9.3* 8.9* 8.5* 7.9*  HCT 30.3* 28.7* 27.5* 26.5* 25.3*  MCV 91.8 92.3 93.9 95.0 96.2  PLT 23* 35* 8* 55* 12*   Basic Metabolic Panel: Recent Labs  Lab 09/22/17 0743 09/23/17 0421 09/24/17 0500 09/25/17 0350 09/26/17 0336  NA 141 141 139 145 146*  K 3.6 3.4* 3.1* 3.0* 3.8  CL 102 102 99* 103 108  CO2 28 30 30  32 29  GLUCOSE 242* 194* 240* 178* 195*  BUN 35* 22* 22* 28* 37*  CREATININE 1.01* 0.83 0.84 0.76 0.88  CALCIUM 7.3* 7.3* 7.3* 7.7* 7.9*  PHOS 2.8 2.2* 2.6 2.7 2.3*   GFR: Estimated Creatinine Clearance: 49.4 mL/min (by C-G formula based on SCr of 0.88 mg/dL). Liver Function Tests: Recent Labs  Lab 09/22/17 0743 09/23/17 0421 09/24/17 0500 09/25/17 0350 09/26/17 0336  AST 50* 52* 68* 46* 34  ALT 41 47 58* 49 40  ALKPHOS 56 63 67 65 61  BILITOT 1.7* 2.3* 2.7* 2.1* 2.2*  PROT 6.2* 6.1* 5.7* 5.7* 5.4*  ALBUMIN 2.1* 2.1* 1.8* 1.9* 1.7*   No results for input(s): LIPASE, AMYLASE in the last 168 hours. No results for input(s): AMMONIA in the last 168 hours. Coagulation Profile: No results for input(s): INR, PROTIME in the last 168 hours. Cardiac Enzymes: No results for input(s): CKTOTAL, CKMB, CKMBINDEX, TROPONINI in the last 168 hours. BNP (last 3 results) No results for input(s): PROBNP in the last 8760 hours. HbA1C: No results for input(s): HGBA1C in the last 72 hours. CBG: Recent Labs  Lab  09/21/17 0656 09/22/17 0737  GLUCAP 333* 216*   Lipid Profile: No results for input(s): CHOL, HDL, LDLCALC, TRIG, CHOLHDL, LDLDIRECT in the last 72 hours. Thyroid Function Tests: No results for input(s): TSH, T4TOTAL, FREET4, T3FREE, THYROIDAB in the last 72 hours. Anemia Panel: No results for input(s): VITAMINB12, FOLATE, FERRITIN, TIBC, IRON, RETICCTPCT in the last 72 hours. Sepsis Labs: No results for input(s): PROCALCITON, LATICACIDVEN in the last 168 hours.  Recent Results (from the past 240 hour(s))  Culture, blood (routine x 2)     Status: None   Collection Time: 09/17/17  7:30 AM  Result Value Ref Range Status   Specimen Description   Final    BLOOD LEFT HAND Performed at [facility] [address], [place], [place] [address]    Special Requests   Final    BOTTLES DRAWN AEROBIC ONLY Blood Culture adequate volume Performed at [facility] [address]  [place], [place] [address]    Culture   Final    NO GROWTH 5 DAYS Performed at [facility], [place] [address] [place], [place] [address]    Report Status 09/22/2017 FINAL  Final  Culture, blood (routine x 2)     Status: None   Collection Time: 09/17/17  7:38 AM  Result Value Ref Range Status   Specimen Description   Final    BLOOD LEFT HAND Performed at [facility] [address] [place], [place] [address]    Special Requests   Final    BOTTLES DRAWN AEROBIC ONLY Blood Culture adequate volume Performed at [facility] [address], [place], [place] [address]    Culture   Final    NO GROWTH 5 DAYS Performed at [facility], [place] [address], [place], [place] [address]    Report Status 09/22/2017 FINAL  Final         Radiology Studies: Dg Chest 1 View  Result Date: 09/25/2017 CLINICAL DATA:  Fever. EXAM: CHEST  1 VIEW COMPARISON:  09/21/2017. FINDINGS: Stable enlarged cardiac silhouette. Stable patchy opacity in  the left mid lung zone with mildly increased patchy opacity in the left lower lung zone. Interval small amount of patchy opacity beneath the minor fissure on the right. Stable mildly prominent interstitial markings. Thoracic spine degenerative changes. Right PICC tip in the inferior aspect of the superior vena cava approximately 1.5 cm above the superior cavoatrial junction. IMPRESSION: 1. Mildly increased left lung pneumonia. 2. Interval small amount of probable pneumonia in the right mid to lower lung zone, possibly in the right middle lobe. 3. Stable cardiomegaly and mild chronic interstitial lung disease. Electronically Signed   By: Claudie Revering M.D.   On: 09/25/2017 21:02        Scheduled Meds: . collagenase   Topical Daily  . feeding supplement (ENSURE ENLIVE)  237 mL Oral TID BM  . ketorolac  1 drop Both Eyes QID  . lidocaine  1 patch Transdermal QAC breakfast  . mouth rinse  15 mL Mouth Rinse BID  . metoprolol tartrate  25 mg Oral BID  . multivitamin with minerals  1 tablet Oral Daily  . nystatin  5 mL Oral Q8H  . polyethylene glycol  17 g Oral BID  . prednisoLONE acetate  1 drop Both Eyes QID  . sodium chloride flush  10-40 mL Intracatheter Q12H  . venetoclax  100 mg Oral Daily   Followed by  . [START ON 09/28/2017] venetoclax  200 mg Oral Daily   Followed by  . [START ON 09/30/2017] venetoclax  400 mg Oral Daily   Continuous Infusions: . ceFEPime (MAXIPIME) IV    . dextrose 5 % and 0.45% NaCl 75 mL/hr at 09/26/17 1151   Followed by  . dextrose 5 % and 0.45% NaCl    . [START ON 09/27/2017] dextrose 5 % and 0.45% NaCl       LOS: 20 days     Georgette Shell, MD [facility]  If 7PM-7AM, please contact night-coverage [email or web address] Password [facility] 09/26/2017, 3:58 PM

## 2017-09-27 LAB — BASIC METABOLIC PANEL
Anion gap: 11 (ref 5–15)
BUN: 35 mg/dL — AB (ref 8–23)
CALCIUM: 7.9 mg/dL — AB (ref 8.9–10.3)
CO2: 31 mmol/L (ref 22–32)
CREATININE: 0.84 mg/dL (ref 0.44–1.00)
Chloride: 103 mmol/L (ref 98–111)
GFR calc Af Amer: >60 mL/min (ref >60)
GLUCOSE: 268 mg/dL — AB (ref 70–99)
POTASSIUM: 3.1 mmol/L — AB (ref 3.5–5.1)
SODIUM: 145 mmol/L (ref 135–145)

## 2017-09-27 LAB — GLUCOSE, CAPILLARY: GLUCOSE-CAPILLARY: 349 mg/dL — AB (ref 70–99)

## 2017-09-27 LAB — CBC WITH DIFFERENTIAL/PLATELET
Basophils Absolute: 0 10*3/uL (ref 0.0–0.1)
Basophils Relative: 0 %
Blasts: 13 %
Eosinophils Absolute: 0 10*3/uL (ref 0.0–0.7)
Eosinophils Relative: 0 %
HCT: 21.9 % — ABNORMAL LOW (ref 36.0–46.0)
Hemoglobin: 6.8 g/dL — CL (ref 12.0–15.0)
LYMPHS PCT: 16 %
Lymphs Abs: 2.2 10*3/uL (ref 0.7–4.0)
MCH: 30 pg (ref 26.0–34.0)
MCHC: 31.1 g/dL (ref 30.0–36.0)
MCV: 96.5 fL (ref 78.0–100.0)
METAMYELOCYTES PCT: 3 %
MONO ABS: 6.3 10*3/uL — AB (ref 0.1–1.0)
MONOS PCT: 45 %
Myelocytes: 9 %
NEUTROS ABS: 3.6 10*3/uL (ref 1.7–7.7)
Neutrophils Relative %: 14 %
Platelets: 6 10*3/uL — CL (ref 150–400)
RBC: 2.27 MIL/uL — ABNORMAL LOW (ref 3.87–5.11)
RDW: 18 % — AB (ref 11.5–15.5)
WBC: 14 10*3/uL — ABNORMAL HIGH (ref 4.0–10.5)
nRBC: 2 /100 WBC — ABNORMAL HIGH

## 2017-09-27 LAB — COMPREHENSIVE METABOLIC PANEL
ALBUMIN: 1.5 g/dL — AB (ref 3.5–5.0)
ALT: 31 U/L (ref 0–44)
AST: 25 U/L (ref 15–41)
Alkaline Phosphatase: 66 U/L (ref 38–126)
Anion gap: 8 (ref 5–15)
BUN: 30 mg/dL — AB (ref 8–23)
CHLORIDE: 104 mmol/L (ref 98–111)
CO2: 29 mmol/L (ref 22–32)
CREATININE: 0.86 mg/dL (ref 0.44–1.00)
Calcium: 7.7 mg/dL — ABNORMAL LOW (ref 8.9–10.3)
GFR calc non Af Amer: >60 mL/min (ref >60)
Glucose, Bld: 481 mg/dL — ABNORMAL HIGH (ref 70–99)
Potassium: 3.3 mmol/L — ABNORMAL LOW (ref 3.5–5.1)
SODIUM: 141 mmol/L (ref 135–145)
Total Bilirubin: 1.7 mg/dL — ABNORMAL HIGH (ref 0.3–1.2)
Total Protein: 5.1 g/dL — ABNORMAL LOW (ref 6.5–8.1)

## 2017-09-27 LAB — URIC ACID
URIC ACID, SERUM: 3.5 mg/dL (ref 2.5–7.1)
Uric Acid, Serum: 4.3 mg/dL (ref 2.5–7.1)

## 2017-09-27 LAB — LACTATE DEHYDROGENASE: LDH: 474 U/L — AB (ref 98–192)

## 2017-09-27 LAB — PHOSPHORUS
PHOSPHORUS: 4 mg/dL (ref 2.5–4.6)
Phosphorus: 2.7 mg/dL (ref 2.5–4.6)

## 2017-09-27 LAB — PREPARE RBC (CROSSMATCH)

## 2017-09-27 MED ORDER — SODIUM CHLORIDE 0.9 % IV SOLN
INTRAVENOUS | Status: DC
  Administered 2017-09-28 – 2017-09-30 (×8): via INTRAVENOUS

## 2017-09-27 MED ORDER — POTASSIUM CHLORIDE 10 MEQ/100ML IV SOLN
10.0000 meq | INTRAVENOUS | Status: DC
  Administered 2017-09-27: 10 meq via INTRAVENOUS

## 2017-09-27 MED ORDER — INSULIN ASPART 100 UNIT/ML ~~LOC~~ SOLN
0.0000 [IU] | Freq: Four times a day (QID) | SUBCUTANEOUS | Status: DC
  Administered 2017-09-27: 7 [IU] via SUBCUTANEOUS
  Administered 2017-09-28: 2 [IU] via SUBCUTANEOUS
  Administered 2017-09-28: 5 [IU] via SUBCUTANEOUS
  Administered 2017-09-28: 2 [IU] via SUBCUTANEOUS
  Administered 2017-09-28: 3 [IU] via SUBCUTANEOUS
  Administered 2017-09-29: 9 [IU] via SUBCUTANEOUS
  Administered 2017-09-29: 2 [IU] via SUBCUTANEOUS
  Administered 2017-09-29: 3 [IU] via SUBCUTANEOUS
  Administered 2017-09-29: 2 [IU] via SUBCUTANEOUS
  Administered 2017-09-30 (×2): 3 [IU] via SUBCUTANEOUS
  Administered 2017-09-30 – 2017-10-01 (×4): 2 [IU] via SUBCUTANEOUS
  Administered 2017-10-01: 7 [IU] via SUBCUTANEOUS
  Administered 2017-10-01 – 2017-10-02 (×2): 2 [IU] via SUBCUTANEOUS
  Administered 2017-10-02: 5 [IU] via SUBCUTANEOUS
  Administered 2017-10-02 (×2): 3 [IU] via SUBCUTANEOUS
  Administered 2017-10-03 (×3): 2 [IU] via SUBCUTANEOUS
  Administered 2017-10-03: 3 [IU] via SUBCUTANEOUS
  Administered 2017-10-04: 7 [IU] via SUBCUTANEOUS
  Administered 2017-10-04 (×2): 3 [IU] via SUBCUTANEOUS
  Administered 2017-10-04 – 2017-10-05 (×2): 2 [IU] via SUBCUTANEOUS
  Administered 2017-10-05 (×2): 3 [IU] via SUBCUTANEOUS
  Administered 2017-10-05 (×2): 2 [IU] via SUBCUTANEOUS
  Administered 2017-10-06: 3 [IU] via SUBCUTANEOUS
  Administered 2017-10-06: 2 [IU] via SUBCUTANEOUS
  Administered 2017-10-07: 3 [IU] via SUBCUTANEOUS
  Administered 2017-10-07 – 2017-10-08 (×3): 2 [IU] via SUBCUTANEOUS
  Administered 2017-10-08 (×2): 5 [IU] via SUBCUTANEOUS
  Administered 2017-10-08: 1 [IU] via SUBCUTANEOUS
  Administered 2017-10-08 – 2017-10-09 (×3): 5 [IU] via SUBCUTANEOUS
  Administered 2017-10-09: 3 [IU] via SUBCUTANEOUS
  Administered 2017-10-10: 5 [IU] via SUBCUTANEOUS
  Administered 2017-10-10 (×2): 2 [IU] via SUBCUTANEOUS
  Administered 2017-10-10 – 2017-10-11 (×2): 3 [IU] via SUBCUTANEOUS
  Administered 2017-10-11 (×2): 5 [IU] via SUBCUTANEOUS

## 2017-09-27 MED ORDER — POTASSIUM CHLORIDE 10 MEQ/100ML IV SOLN
10.0000 meq | INTRAVENOUS | Status: AC
  Filled 2017-09-27: qty 100

## 2017-09-27 MED ORDER — SODIUM CHLORIDE 0.9 % IV SOLN
INTRAVENOUS | Status: DC
  Administered 2017-09-28 – 2017-09-30 (×3): via INTRAVENOUS

## 2017-09-27 MED ORDER — SODIUM CHLORIDE 0.9% IV SOLUTION
Freq: Once | INTRAVENOUS | Status: AC
  Administered 2017-09-27: 12:00:00 via INTRAVENOUS

## 2017-09-27 MED ORDER — FUROSEMIDE 10 MG/ML IJ SOLN
20.0000 mg | Freq: Once | INTRAMUSCULAR | Status: AC
  Administered 2017-09-27: 20 mg via INTRAVENOUS
  Filled 2017-09-27: qty 2

## 2017-09-27 NOTE — Progress Notes (Signed)
CRITICAL VALUE ALERT  Critical Value:  Hgb. 6.8  Date & Time Notied:  09/27/17 @ 0732  Provider Notified: Dr. Coralee Pesa  Orders Received/Actions taken: Orders for 2u PRBCs already ordered.

## 2017-09-27 NOTE — Progress Notes (Signed)
   09/27/17 9983-3825 Hydrotherapy treatment note  Subjective Assessment  Subjective OH, my arm hurts.  Patient and Family Stated Goals none stated, no family present  Date of Onset  (present on admission)  Prior Treatments dressing changes per nursing  Evaluation and Treatment  Evaluation and Treatment Procedures Explained to Patient/Family Yes  Evaluation and Treatment Procedures Patient unable to consent due to mental status  Pressure Injury 09/26/17 Unstageable - Full thickness tissue loss in which the base of the ulcer is covered by slough (yellow, tan, gray, green or brown) and/or eschar (tan, brown or black) in the wound bed. *PT ONLY* sacral pressure injury  Date First Assessed/Time First Assessed: 09/26/17 1130   Location: Sacrum  Location Orientation: Mid  Staging: Unstageable - Full thickness tissue loss in which the base of the ulcer is covered by slough (yellow, tan, gray, green or brown) and/or esch...  Dressing Type Moist to dry (santyl)  Site / Wound Assessment Brown;Yellow (gray)  % Wound base Red or Granulating 0%  % Wound base Yellow/Fibrinous Exudate 100%  Peri-wound Assessment Erythema (blanchable);Maceration  Wound Length (cm) 5 cm  Wound Width (cm) 3.5 cm  Wound Depth (cm) 1.5 cm  Wound Surface Area (cm^2) 17.5 cm^2  Wound Volume (cm^3) 26.25 cm^3  Margins Unattached edges (unapproximated)  Drainage Amount Minimal  Drainage Description Purulent  Treatment Debridement (Selective);Hydrotherapy (Pulse lavage);Packing (Saline gauze) (santyl,  Allevyn)  Hydrotherapy  Pulsed lavage therapy - wound location sacrum  Pulsed Lavage with Suction (psi) 4 psi  Pulsed Lavage with Suction - Normal Saline Used 1000 mL  Pulsed Lavage Tip Tip with splash shield  Wound Therapy - Assess/Plan/Recommendations  Wound Therapy - Clinical Statement Pt with unstageable pressure injury to sacrum, unable to visualize wound base d/t slough, purulent odor present;  pt will benefit from  hydrotherapy to aide in healing and decr bioburden;   Wound Therapy - Functional Problem List limited mobility  Factors Delaying/Impairing Wound Healing Immobility  Hydrotherapy Plan Dressing change;Patient/family education;Pulsatile lavage with suction;Debridement  Wound Therapy - Frequency 6X / week  Wound Therapy - Follow Up Recommendations Skilled nursing facility  Wound Plan hydrotherapy 6x/wk; caution used with frequent wound checks during pulse lavage with light suction/4psi used today d/t pt platelet count low with pt at risk for bleeding;   Wound Therapy Goals - Improve the function of patient's integumentary system by progressing the wound(s) through the phases of wound healing by:  Decrease Necrotic Tissue to 75  Decrease Necrotic Tissue - Progress Not progressing  Increase Granulation Tissue to 25  Increase Granulation Tissue - Progress Mot progressing  Goals/treatment plan/discharge plan were made with and agreed upon by patient/family No, Patient unable to participate in goals/treatment/discharge plan and family unavailable  Time For Goal Achievement 2 weeks  Wound Therapy - Potential for Goals Poor  Leah Lewis PT [phone]

## 2017-09-27 NOTE — Progress Notes (Signed)
Occupational Therapy Treatment Patient Details Name: Leah Lewis MRN: [id number] DOB: 1935-04-01 Today's Date: 09/27/2017    History of present illness 83 y.o. female with medical history significant of hypertension, prior stroke hx of left DVT.  Patient presented to the emergency department complaining of generalized weakness and fatigue. Dx of fever,anemia.  New diagnosis of AML-oncology following   OT comments  OT session focused on self feeding- pt max A holding cup  Follow Up Recommendations  SNF    Equipment Recommendations  None recommended by OT    Recommendations for Other Services      Precautions / Restrictions Precautions Precaution Comments: reports no h/o falls in past 1 year; pt is legally blind but can see objects       Mobility Bed Mobility Overal bed mobility: Needs Assistance Bed Mobility: Rolling Rolling: Total assist         General bed mobility comments: total A of OT to reposition in bed  Transfers                      Balance Overall balance assessment: Needs assistance   Sitting balance-Leahy Scale: Zero Sitting balance - Comments: patient  did not consistently place UE's for support, kept moving the arms, listing backward to right. no responses                                   ADL either performed or assessed with clinical judgement   ADL Overall ADL's : Needs assistance/impaired Eating/Feeding: Maximal assistance;Bed level Eating/Feeding Details (indicate cue type and reason): positioned in bed with pillows on each side.  Pt able to hold cup with both hands and get drink to mouth.  Pt not able to hold spoon- may be also related to pts vision. Grooming: Wash/dry face;Maximal assistance;Bed level                                       Vision Baseline Vision/History: Wears glasses            Cognition Arousal/Alertness: Lethargic Behavior During Therapy: Flat affect Overall Cognitive  Status: Impaired/Different from baseline                                                     Pertinent Vitals/ Pain       Faces Pain Scale: No hurt     Prior Functioning/Environment              Frequency  Min 2X/week        Progress Toward Goals  OT Goals(current goals can now be found in the care plan section)  Progress towards OT goals: Progressing toward goals     Plan Discharge plan remains appropriate       AM-PAC PT "6 Clicks" Daily Activity     Outcome Measure   Help from another person eating meals?: Total Help from another person taking care of personal grooming?: Total Help from another person toileting, which includes using toliet, bedpan, or urinal?: Total Help from another person bathing (including washing, rinsing, drying)?: Total Help from another person to put on and taking off regular upper body clothing?: Total Help  from another person to put on and taking off regular lower body clothing?: Total 6 Click Score: 6    End of Session    OT Visit Diagnosis: Unsteadiness on feet (R26.81);Muscle weakness (generalized) (M62.81);Other abnormalities of gait and mobility (R26.89)   Activity Tolerance Patient limited by fatigue   Patient Left in bed;with call bell/phone within reach;with bed alarm set   Nurse Communication Mobility status        Time: 8657-8469 OT Time Calculation (min): 20 min  Charges: OT General Charges $OT Visit: 1 Visit OT Treatments $Self Care/Home Management : 8-22 mins  [place], [place]   Payton Mccallum D 09/27/2017, 11:05 AM

## 2017-09-27 NOTE — Telephone Encounter (Signed)
Oral Chemotherapy Pharmacist Encounter  Patient Education I spoke with patient's daughter Joycelyn Schmid yesterday 09/26/17 for overview of new oral chemotherapy medication: Venclexta (venetoclax) for the treatment of newly diagnosed AML, planned duration until disease progression or unacceptable drug toxicity.   Counseled on administration, dosing, side effects, monitoring, drug-food interactions, safe handling, storage, and disposal.  Side effects include but not limited to: decreased wbc, diarrhea, N/V, fatigue, electrolyte changes.    Reviewed with patient importance of keeping a medication schedule and plan for any missed doses.  Margaret voiced understanding and appreciation. All questions answered.  Because Ms. Shirely Martinique is inpatient, her medication was picked up by inpatient pharmacist and stored in [facility] inpatient pharmacy for inpatient administration. Joycelyn Schmid knows that they will need need leave with the medication when her mom is discharged.  Darl Pikes, PharmD, BCPS Hematology/Oncology Clinical Pharmacist [facility] [phone]  09/27/2017 8:25 AM

## 2017-09-27 NOTE — Progress Notes (Signed)
PROGRESS NOTE    Leah Lewis  AST:[id number] DOB: 1935/02/05 DOA: 09/09/2017 PCP: Wenda Low, MD   Brief Narrative:83 year old female with past medical history of osteoporosis, hyperlipidemia, DVT on Xarelto presented to the emergency department complaining of generalized weakness. 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 During hospital stay patient spiking fevers, being treated with antibiotic but obvious source of infection. Patient developed A. fib with RVR, cardiology consulted and treated with Cardizem. Thrombocytopenia worsened and she was transfused 1 unit of platelets. Per oncology poor prognosis and palliative care has been consulted. Patient has been started on chemotherapy. Heart rate improving.    Assessment & Plan:   Active Problems:   Pressure injury of skin   AML (acute myeloid leukemia) (HCC)   Goals of care, counseling/discussion   AML (acute myeloblastic leukemia) ([place])   Advance care planning   Palliative care by specialist   Fever   Pancytopenia ([place])   Atrial fibrillation with RVR (HCC)   Thrush   Thrombocytopenia (HCC)  1] AML status post Decitabine.  Patient to be started on venetoclax 6/24.  Her WBC count is INCREASING , platelet dropped again to 6.  Hemoglobin DROPPED TO 6.8.  Platelet transfusion and RBC transfusion ordered by Dr. Marin Olp. Her chest x-ray shows changes consistent with pneumonia again.  At baseline her chest x-ray was abnormal.  Patient currently started on cefepime for possible pneumonia.  She remains on 2  L of oxygen without changes.  She denies any cough.  She has not had any further fever.  Blood cultures from yesterday 09/25/2017 are pending.    Patient with minimal p.o. intake.  When I discussed her with her this morning about increasing the p.o. intake and that we discussed about placing a tube for nutrition patient adamantly refused that she does not want a tube in  her nose or in her stomach for feeding.  Her platelets are too low to do and procedure at this time.  added  Remeron to increase her appetite.  Her overall prognosis remains grim at this time.albumin 1.5.  2] atrial fibrillation patient is on Cardizem will change to beta-blocker since  Cardizem will interact with a new chemo.  3] hypertension continue low-dose Lopressor.  4] unstageable sacral pressure ulcer without any improvement-wound therapy has recommended hydrotherapy.  Continue low-air-loss mattress.PT UNABLE TO do hydrotherapy yest.     DVT prophylaxis:scd Code Status full  Family Communication: none Disposition Plan: tbd per onc Consultants: onc, palliative care  Procedures: none Antimicrobials: cefepime Subjective:resting in bed in nad.answered my questions appropriately  Objective: Vitals:   09/27/17 0222 09/27/17 0514 09/27/17 1109 09/27/17 1145  BP:  110/60 (!) 90/58 (!) 98/52  Pulse:  92 94 87  Resp:  (!) 32 20 18  Temp:  (!) 97.3 F (36.3 C) 99.2 F (37.3 C) 98.2 F (36.8 C)  TempSrc:  Axillary Oral Axillary  SpO2:  96% 100% 96%  Weight: 66.2 kg (146 lb)     Height:        Intake/Output Summary (Last 24 hours) at 09/27/2017 1224 Last data filed at 09/27/2017 0942 Gross per 24 hour  Intake 1657.92 ml  Output 1400 ml  Net 257.92 ml   Filed Weights   09/23/17 0424 09/24/17 0452 09/27/17 0222  Weight: 74.5 kg (164 lb 3.9 oz) 73.9 kg (162 lb 14.7 oz) 66.2 kg (146 lb)    Examination:  General exam: Appears calm and comfortable  Respiratory system: Clear to auscultation. Respiratory effort normal. Cardiovascular system: S1 & S2 heard, RRR. No JVD, murmurs, rubs, gallops or clicks. No pedal edema. Gastrointestinal system: Abdomen is nondistended, soft and nontender. No organomegaly or masses felt. Normal bowel sounds heard. Central nervous system: Alert and oriented. No focal neurological deficits. Extremities: Symmetric 5 x 5 power. Skin unstageable  sacral pressure ulcer with no granulation tissue    Data Reviewed: I have personally reviewed following labs and imaging studies  CBC: Recent Labs  Lab 09/23/17 0421 09/24/17 0500 09/25/17 0350 09/26/17 0336 09/27/17 0505  WBC 11.5* 11.6* 7.3 7.1 14.0*  NEUTROABS 1.8 3.0 0.9* 2.1 3.6  HGB 9.3* 8.9* 8.5* 7.9* 6.8*  HCT 28.7* 27.5* 26.5* 25.3* 21.9*  MCV 92.3 93.9 95.0 96.2 96.5  PLT 35* 8* 55* 12* 6*   Basic Metabolic Panel: Recent Labs  Lab 09/24/17 0500 09/25/17 0350 09/26/17 0336 09/26/17 2200 09/27/17 0505  NA 139 145 146* 146* 141  K 3.1* 3.0* 3.8 3.8 3.3*  CL 99* 103 108 105 104  CO2 30 32 29 30 29   GLUCOSE 240* 178* 195* 374* 481*  BUN 22* 28* 37* 33* 30*  CREATININE 0.84 0.76 0.88 0.91 0.86  CALCIUM 7.3* 7.7* 7.9* 8.3* 7.7*  PHOS 2.6 2.7 2.3* 2.1* 2.7   GFR: Estimated Creatinine Clearance: 48 mL/min (by C-G formula based on SCr of 0.86 mg/dL). Liver Function Tests: Recent Labs  Lab 09/23/17 0421 09/24/17 0500 09/25/17 0350 09/26/17 0336 09/27/17 0505  AST 52* 68* 46* 34 25  ALT 47 58* 49 40 31  ALKPHOS 63 67 65 61 66  BILITOT 2.3* 2.7* 2.1* 2.2* 1.7*  PROT 6.1* 5.7* 5.7* 5.4* 5.1*  ALBUMIN 2.1* 1.8* 1.9* 1.7* 1.5*   No results for input(s): LIPASE, AMYLASE in the last 168 hours. No results for input(s): AMMONIA in the last 168 hours. Coagulation Profile: No results for input(s): INR, PROTIME in the last 168 hours. Cardiac Enzymes: No results for input(s): CKTOTAL, CKMB, CKMBINDEX, TROPONINI in the last 168 hours. BNP (last 3 results) No results for input(s): PROBNP in the last 8760 hours. HbA1C: No results for input(s): HGBA1C in the last 72 hours. CBG: Recent Labs  Lab 09/21/17 0656 09/22/17 0737  GLUCAP 333* 216*   Lipid Profile: No results for input(s): CHOL, HDL, LDLCALC, TRIG, CHOLHDL, LDLDIRECT in the last 72 hours. Thyroid Function Tests: No results for input(s): TSH, T4TOTAL, FREET4, T3FREE, THYROIDAB in the last 72  hours. Anemia Panel: No results for input(s): VITAMINB12, FOLATE, FERRITIN, TIBC, IRON, RETICCTPCT in the last 72 hours. Sepsis Labs: No results for input(s): PROCALCITON, LATICACIDVEN in the last 168 hours.  Recent Results (from the past 240 hour(s))  Culture, blood (Routine X 2) w Reflex to ID Panel     Status: None (Preliminary result)   Collection Time: 09/25/17  7:28 PM  Result Value Ref Range Status   Specimen Description   Final    BLOOD LEFT ANTECUBITAL Performed at [facility] [address] [place], [place] [address]    Special Requests   Final    BOTTLES DRAWN AEROBIC AND ANAEROBIC Blood Culture adequate volume Performed at [facility] [address] [place], [place] [address]    Culture   Final    NO GROWTH 1 DAY Performed at [facility], [place] [address] [place], [place] [address]    Report Status PENDING  Incomplete  Culture, blood (Routine X 2) w Reflex to ID Panel  Status: None (Preliminary result)   Collection Time: 09/25/17  7:28 PM  Result Value Ref Range Status   Specimen Description   Final    BLOOD BLOOD LEFT HAND Performed at [facility] [address] [place], [place] [address]    Special Requests   Final    BOTTLES DRAWN AEROBIC AND ANAEROBIC Blood Culture results may not be optimal due to an inadequate volume of blood received in culture bottles Performed at [facility] [address], [place], [place] [address]    Culture   Final    NO GROWTH 1 DAY Performed at [facility], [place] [address] [place], [place] [address]    Report Status PENDING  Incomplete         Radiology Studies: Dg Chest 1 View  Result Date: 09/25/2017 CLINICAL DATA:  Fever. EXAM: CHEST  1 VIEW COMPARISON:  09/21/2017. FINDINGS: Stable enlarged cardiac silhouette. Stable patchy opacity in the left mid lung zone with mildly increased patchy opacity in the left lower lung  zone. Interval small amount of patchy opacity beneath the minor fissure on the right. Stable mildly prominent interstitial markings. Thoracic spine degenerative changes. Right PICC tip in the inferior aspect of the superior vena cava approximately 1.5 cm above the superior cavoatrial junction. IMPRESSION: 1. Mildly increased left lung pneumonia. 2. Interval small amount of probable pneumonia in the right mid to lower lung zone, possibly in the right middle lobe. 3. Stable cardiomegaly and mild chronic interstitial lung disease. Electronically Signed   By: Claudie Revering M.D.   On: 09/25/2017 21:02        Scheduled Meds: . collagenase   Topical Daily  . feeding supplement (ENSURE ENLIVE)  237 mL Oral TID BM  . furosemide  20 mg Intravenous Once  . insulin aspart  0-9 Units Subcutaneous Q6H  . ketorolac  1 drop Both Eyes QID  . lidocaine  1 patch Transdermal QAC breakfast  . mouth rinse  15 mL Mouth Rinse BID  . metoprolol tartrate  25 mg Oral BID  . mirtazapine  7.5 mg Oral QHS  . multivitamin with minerals  1 tablet Oral Daily  . nystatin  5 mL Oral Q8H  . polyethylene glycol  17 g Oral BID  . prednisoLONE acetate  1 drop Both Eyes QID  . sodium chloride flush  10-40 mL Intracatheter Q12H  . [START ON 09/28/2017] venetoclax  200 mg Oral Daily   Followed by  . [START ON 09/30/2017] venetoclax  400 mg Oral Daily   Continuous Infusions: . ceFEPime (MAXIPIME) IV Stopped (09/27/17 1153)  . dextrose 5 % and 0.45% NaCl 50 mL/hr at 09/27/17 0646     LOS: 21 days      Georgette Shell, MD [facility] If 7PM-7AM, please contact night-coverage [email or web address] Password [facility] 09/27/2017, 12:24 PM

## 2017-09-27 NOTE — Progress Notes (Signed)
According to the flowsheet, the patient has had a 16 lb. weight loss since 6/22.  She refused all PO intake last night, except for water.

## 2017-09-27 NOTE — Progress Notes (Signed)
Leah Lewis was started on the venetoclax yesterday.  Her dose is being titrated up.  She has had a drop in her blood counts.  Her platelet count is 6000.  Her hemoglobin probably is going to be less than 7.  Her blood sugars are incredibly high.  I am sure this is from the IV fluid that she is on.  Her LDH is still coming down.  Her LDH was 474.  Her white cell count went from 7000 up 14,000.  Hopefully, this is not a reflection of her leukemia becoming resilient already.    It is hard to say how much she is really eating.  Her albumin is only 1.5.  This is somewhat troublesome for me.  She is on Maxipime.  She is afebrile this morning.  There is no obvious bleeding.  I am not sure how much physical therapy she really is taking.  I hope that we can get the Venetoclax to start working quickly.  On our exam, her temperature is 97.3.  Pulse 92.  Blood pressure 110/60.  Her lungs sound clear.  Cardiac exam regular rate and rhythm consistent with the fibrillation.  Abdomen is soft.  There is no fluid wave.  There is no palpable liver or spleen tip.  Extremities shows no clubbing, cyanosis or edema.  Skin exam shows no rashes, ecchymoses or petechia.  She still is on the air mattress for her decubiti.  I think it will be hard for her decubiti to heal given her nutritional status being somewhat suboptimal.  [name], MD

## 2017-09-28 ENCOUNTER — Inpatient Hospital Stay (HOSPITAL_COMMUNITY): Payer: Medicare Other

## 2017-09-28 LAB — COMPREHENSIVE METABOLIC PANEL
ALT: 32 U/L (ref 0–44)
AST: 33 U/L (ref 15–41)
Albumin: 1.7 g/dL — ABNORMAL LOW (ref 3.5–5.0)
Alkaline Phosphatase: 72 U/L (ref 38–126)
Anion gap: 11 (ref 5–15)
BILIRUBIN TOTAL: 2 mg/dL — AB (ref 0.3–1.2)
BUN: 30 mg/dL — AB (ref 8–23)
CHLORIDE: 100 mmol/L (ref 98–111)
CO2: 32 mmol/L (ref 22–32)
Calcium: 7.6 mg/dL — ABNORMAL LOW (ref 8.9–10.3)
Creatinine, Ser: 0.78 mg/dL (ref 0.44–1.00)
Glucose, Bld: 218 mg/dL — ABNORMAL HIGH (ref 70–99)
Potassium: 2.8 mmol/L — ABNORMAL LOW (ref 3.5–5.1)
Sodium: 143 mmol/L (ref 135–145)
TOTAL PROTEIN: 5.8 g/dL — AB (ref 6.5–8.1)

## 2017-09-28 LAB — PREPARE PLATELET PHERESIS
UNIT DIVISION: 0
UNIT DIVISION: 0

## 2017-09-28 LAB — CBC WITH DIFFERENTIAL/PLATELET
BASOS PCT: 0 %
BLASTS: 11 %
Basophils Absolute: 0 10*3/uL (ref 0.0–0.1)
Eosinophils Absolute: 0 10*3/uL (ref 0.0–0.7)
Eosinophils Relative: 0 %
HEMATOCRIT: 31.1 % — AB (ref 36.0–46.0)
Hemoglobin: 10.4 g/dL — ABNORMAL LOW (ref 12.0–15.0)
LYMPHS ABS: 2 10*3/uL (ref 0.7–4.0)
Lymphocytes Relative: 27 %
MCH: 30.6 pg (ref 26.0–34.0)
MCHC: 33.4 g/dL (ref 30.0–36.0)
MCV: 91.5 fL (ref 78.0–100.0)
Metamyelocytes Relative: 3 %
Monocytes Absolute: 2.3 10*3/uL — ABNORMAL HIGH (ref 0.1–1.0)
Monocytes Relative: 30 %
Myelocytes: 15 %
NEUTROS ABS: 2.4 10*3/uL (ref 1.7–7.7)
NEUTROS PCT: 14 %
NRBC: 1 /100{WBCs} — AB
Platelets: 44 10*3/uL — ABNORMAL LOW (ref 150–400)
RBC: 3.4 MIL/uL — ABNORMAL LOW (ref 3.87–5.11)
RDW: 16.8 % — ABNORMAL HIGH (ref 11.5–15.5)
WBC: 7.5 10*3/uL (ref 4.0–10.5)

## 2017-09-28 LAB — GLUCOSE, CAPILLARY
GLUCOSE-CAPILLARY: 194 mg/dL — AB (ref 70–99)
GLUCOSE-CAPILLARY: 287 mg/dL — AB (ref 70–99)
Glucose-Capillary: 279 mg/dL — ABNORMAL HIGH (ref 70–99)
Glucose-Capillary: 181 mg/dL — ABNORMAL HIGH (ref 70–99)
Glucose-Capillary: 213 mg/dL — ABNORMAL HIGH (ref 70–99)

## 2017-09-28 LAB — BPAM RBC
Blood Product Expiration Date: 201907232359
Blood Product Expiration Date: 201907232359
ISSUE DATE / TIME: 201906251523
ISSUE DATE / TIME: 201906252356
UNIT TYPE AND RH: 6200
Unit Type and Rh: 6200

## 2017-09-28 LAB — TYPE AND SCREEN
ABO/RH(D): A POS
Antibody Screen: NEGATIVE
Unit division: 0
Unit division: 0

## 2017-09-28 LAB — PHOSPHORUS
PHOSPHORUS: 3.4 mg/dL (ref 2.5–4.6)
PHOSPHORUS: 3.3 mg/dL (ref 2.5–4.6)

## 2017-09-28 LAB — BASIC METABOLIC PANEL
Anion gap: 10 (ref 5–15)
BUN: 27 mg/dL — AB (ref 8–23)
CHLORIDE: 102 mmol/L (ref 98–111)
CO2: 30 mmol/L (ref 22–32)
CREATININE: 0.78 mg/dL (ref 0.44–1.00)
Calcium: 7.4 mg/dL — ABNORMAL LOW (ref 8.9–10.3)
GFR calc Af Amer: >60 mL/min (ref >60)
GFR calc non Af Amer: >60 mL/min (ref >60)
GLUCOSE: 232 mg/dL — AB (ref 70–99)
Potassium: 3.7 mmol/L (ref 3.5–5.1)
SODIUM: 142 mmol/L (ref 135–145)

## 2017-09-28 LAB — BPAM PLATELET PHERESIS
BLOOD PRODUCT EXPIRATION DATE: 201906282359
Blood Product Expiration Date: 201906282359
ISSUE DATE / TIME: 201906251123
ISSUE DATE / TIME: 201906251312
UNIT TYPE AND RH: 6200
Unit Type and Rh: 6200

## 2017-09-28 LAB — LACTATE DEHYDROGENASE: LDH: 490 U/L — ABNORMAL HIGH (ref 98–192)

## 2017-09-28 LAB — URIC ACID
URIC ACID, SERUM: 3.8 mg/dL (ref 2.5–7.1)
Uric Acid, Serum: 3.6 mg/dL (ref 2.5–7.1)

## 2017-09-28 MED ORDER — POTASSIUM CHLORIDE 20 MEQ/15ML (10%) PO SOLN
40.0000 meq | Freq: Once | ORAL | Status: DC
  Filled 2017-09-28: qty 30

## 2017-09-28 MED ORDER — POTASSIUM CHLORIDE 10 MEQ/100ML IV SOLN
10.0000 meq | INTRAVENOUS | Status: AC
  Administered 2017-09-28 (×4): 10 meq via INTRAVENOUS
  Filled 2017-09-28 (×4): qty 100

## 2017-09-28 NOTE — Progress Notes (Signed)
   09/28/17 1500  Pressure Injury 09/26/17 Unstageable - Full thickness tissue loss in which the base of the ulcer is covered by slough (yellow, tan, gray, green or brown) and/or eschar (tan, brown or black) in the wound bed. *PT ONLY* sacral pressure injury  Date First Assessed/Time First Assessed: 09/26/17 1130   Location: Sacrum  Location Orientation: Mid  Staging: Unstageable - Full thickness tissue loss in which the base of the ulcer is covered by slough (yellow, tan, gray, green or brown) and/or esch...  Dressing Type Moist to dry (santyl)  Site / Wound Assessment Brown;Yellow (gray)  % Wound base Red or Granulating 0%  % Wound base Yellow/Fibrinous Exudate 100%  Peri-wound Assessment Erythema (blanchable);Maceration  Margins Unattached edges (unapproximated)  Drainage Amount Minimal  Drainage Description Purulent  Hydrotherapy  Pulsed lavage therapy - wound location sacrum  Pulsed Lavage with Suction (psi) 4 psi  Pulsed Lavage with Suction - Normal Saline Used 1000 mL  Pulsed Lavage Tip Tip with splash shield  Wound Therapy - Assess/Plan/Recommendations  Wound Therapy - Clinical Statement Pt with unstageable pressure injury to sacrum, unable to visualize wound base d/t slough, purulent odor present;  pt will benefit from hydrotherapy to aide in healing and decr bioburden;   Wound Therapy - Functional Problem List limited mobility  Factors Delaying/Impairing Wound Healing Immobility  Hydrotherapy Plan Dressing change;Patient/family education;Pulsatile lavage with suction;Debridement  Wound Therapy - Frequency 6X / week  Wound Therapy - Follow Up Recommendations Skilled nursing facility  Wound Plan hydrotherapy 6x/wk; caution used with frequent wound checks during pls lavage with light suction/4psi used today d/t pt platelet count low with pt at risk for bleeding;   Wound Therapy Goals - Improve the function of patient's integumentary system by progressing the wound(s) through the  phases of wound healing by:  Decrease Necrotic Tissue to 75  Increase Granulation Tissue to 25  Goals/treatment plan/discharge plan were made with and agreed upon by patient/family No, Patient unable to participate in goals/treatment/discharge plan and family unavailable  Time For Goal Achievement 2 weeks  Wound Therapy - Potential for Goals Poor   Leah Lewis  PTA [facility]  Acute  Rehab Pager      [phone]

## 2017-09-28 NOTE — Progress Notes (Signed)
Tried several times to administer the oral chemo to the patient, she has been refusing to take it, at one time she hid the pills in her hand, the other time she claimed she swallowed it, but she pocketed it in her mouth, another attempt was made and she spit out the meds.

## 2017-09-28 NOTE — Progress Notes (Signed)
Leah Lewis is a little bit more perky today.  She is not happy about the air mattress that she is on.  She says that it bothers her shoulders.  She has had no issues with the Venetoclax.  There is no tumor lysis.  There is no labs back yet.  There is no obvious cough or shortness of breath.  She probably needs another chest x-ray to evaluate for the pneumonia.  She is on IV antibiotics for this.  I do not see any obvious thrush in her oral cavity.  She has had no bleeding.  I think wound care is helping with the decubiti on her back.  She is afebrile.  Her blood pressure is 102/60.  Her heart rate is 70.  I do not think there is been any issues with diarrhea.  Again we have to see what the CBC looks like.  [name], MD  Romans 5:3-5

## 2017-09-28 NOTE — Progress Notes (Signed)
Received call from pt's daughter Joycelyn Schmid this morning following up about disposition plans. Daughter reports family still undecided, however they are interested in SNF (specifically [facility] only facility they are interested in thus far) and requested CSW make referral. Obtained PASRR and completed FL2 for SNF referral. Will continue following to assist pt and family with planning.  Sharren Bridge, MSW, LCSW Clinical Social Work 09/28/2017 [phone]

## 2017-09-28 NOTE — Progress Notes (Signed)
Daily Progress Note   Patient Name: Leah Lewis       Date: 09/28/2017 DOB: 24-Apr-1934  Age: 83 y.o. MRN#: [id number] Attending Physician: Mariel Aloe, MD Primary Care Physician: Wenda Low, MD Admit Date: 09/16/2017  Reason for Consultation/Follow-up: Establishing goals of care  Subjective: Patient in bed. No complaints of pain. I again attempted to engage patient in [place] and EOL discussion. She declined to engage.  When I asked her if she could have anything at all right now, what would she want- she replied, "Jesus". She then turned her head and told me she was going to take a nap. Noted per RN she is declining medications.  I called Joycelyn Schmid for continued [place] discussion. Per Joycelyn Schmid plan is to continue chemotherapy and pursue rehab at [facility]. Joycelyn Schmid has contacted [facility] for consult as well.  Joycelyn Schmid continues to wish for aggressive medical care for Leah Lewis.   ROS  Length of Stay: 22  Current Medications: Scheduled Meds:  . collagenase   Topical Daily  . feeding supplement (ENSURE ENLIVE)  237 mL Oral TID BM  . insulin aspart  0-9 Units Subcutaneous Q6H  . ketorolac  1 drop Both Eyes QID  . lidocaine  1 patch Transdermal QAC breakfast  . mouth rinse  15 mL Mouth Rinse BID  . metoprolol tartrate  25 mg Oral BID  . mirtazapine  7.5 mg Oral QHS  . multivitamin with minerals  1 tablet Oral Daily  . nystatin  5 mL Oral Q8H  . polyethylene glycol  17 g Oral BID  . prednisoLONE acetate  1 drop Both Eyes QID  . sodium chloride flush  10-40 mL Intracatheter Q12H  . venetoclax  200 mg Oral Daily   Followed by  . [START ON 09/30/2017] venetoclax  400 mg Oral Daily    Continuous Infusions: . sodium chloride 50 mL/hr at 09/28/17 0044  . sodium chloride 75 mL/hr at  09/28/17 0819  . ceFEPime (MAXIPIME) IV Stopped (09/28/17 1140)    PRN Meds: acetaminophen **OR** acetaminophen, alteplase, heparin lock flush, heparin lock flush, heparin lock flush, heparin lock flush, HYDROcodone-acetaminophen, labetalol, ondansetron (ZOFRAN) IV, sodium chloride flush, sodium chloride flush, sodium chloride flush  Physical Exam  Constitutional:  frail  Musculoskeletal:  weakness  Neurological:  alert  Skin: Skin is warm and  dry.  Sacral decubitis  Nursing note and vitals reviewed.           Vital Signs: BP (!) 102/58 (BP Location: Left Arm)   Pulse 70   Temp 98.9 F (37.2 C) (Axillary)   Resp (!) 28   Ht 5\' 4"  (1.626 m)   Wt 68.9 kg (152 lb)   SpO2 100%   BMI 26.09 kg/m  SpO2: SpO2: 100 % O2 Device: O2 Device: Nasal Cannula O2 Flow Rate: O2 Flow Rate (L/min): 2.5 L/min  Intake/output summary:   Intake/Output Summary (Last 24 hours) at 09/28/2017 1212 Last data filed at 09/28/2017 1017 Gross per 24 hour  Intake 3494.83 ml  Output 900 ml  Net 2594.83 ml   LBM: Last BM Date: 09/25/17 Baseline Weight: Weight: 68.9 kg (152 lb) Most recent weight: Weight: 68.9 kg (152 lb)       Palliative Assessment/Data: PPS: 20%    Flowsheet Rows     Most Recent Value  Intake Tab  Referral Department  Hospitalist  Unit at Time of Referral  Med/Surg Unit  Palliative Care Primary Diagnosis  Neurology  Date Notified  09/15/17  Palliative Care Type  New Palliative care  Reason for referral  Clarify Goals of Care  Date of Admission  09/07/2017  Date first seen by Palliative Care  09/16/17  # of days Palliative referral response time  1 Day(s)  # of days IP prior to Palliative referral  10  Clinical Assessment  Psychosocial & Spiritual Assessment  Palliative Care Outcomes      Patient Active Problem List   Diagnosis Date Noted  . Thrombocytopenia ([place])   . Thrush 09/22/2017  . Fever   . Pancytopenia ([facility])   . Atrial fibrillation with RVR ([place])   . AML  (acute myeloblastic leukemia) ([place])   . Advance care planning   . Palliative care by specialist   . AML (acute myeloid leukemia) ([place]) 09/12/2017  . Goals of care, counseling/discussion 09/12/2017  . Pressure injury of skin 09/06/2017  . DVT (deep venous thrombosis), left 03/02/2015  . DVT (deep venous thrombosis), unspecified laterality 03/02/2015  . GERD (gastroesophageal reflux disease) 03/02/2015  . Chest pain 04/20/2011  . Cervical radiculopathy 04/20/2011  . Hypertension   . Stroke ([place])   . High cholesterol     Palliative Care Assessment & Plan   Patient Profile: 83 y.o. female  with past medical history of osteoporosis, hyperlipidemia, occlusive lower left extremity DVT (on Xarelto), CVA, admitted on 09/28/2017 with worsening weakness and fatigue. She was scheduled to be seen by oncology same day of admission but missed the appointment due to weakness. Workup this admission has revealed AML and she has been started on treatment with Dacogen and Venetoclax. Palliative medicine consulted for [place].    Assessment/Recommendations/Plan   Continue current level of care- family wishes to pursue full aggressive care including full code status- hopeful for d/c to SNF  Recommend Palliative follow at SNF when discharged  PMT will continue to shadow and intervene if needed  Goals of Care and Additional Recommendations:  Limitations on Scope of Treatment: Full Scope Treatment  Code Status:  Full code  Prognosis:   Unable to determine  Discharge Planning:  [place] [facility] for rehab with Palliative care service follow-up  Care plan was discussed with Margaret Lewis.  Thank you for allowing the Palliative Medicine Team to assist in the care of this patient.   Time In: 1145 Time Out: 1220 Total Time 35 minutes  Prolonged Time Billed no      Greater than 50%  of this time was spent counseling and coordinating care related to the above assessment and plan.  [name], AGNP-C Palliative Medicine   Please contact Palliative Medicine Team phone at [phone] for questions and concerns.

## 2017-09-28 NOTE — NC FL2 (Signed)
[place] LEVEL OF CARE SCREENING TOOL     IDENTIFICATION  Patient Name: Leah Lewis Birthdate: 12-25-1934 Sex: female Admission Date (Current Location): 09/20/2017  [facility] and [place] Number:  Herbalist and Address:  [facility],  [address] [place], [place]      Provider Number: [id number]  Attending Physician Name and Address:  Mariel Aloe, MD  Relative Name and Phone Number:       Current Level of Care: Hospital Recommended Level of Care: [place] Prior Approval Number:    Date Approved/Denied:   PASRR Number: [id number]  Discharge Plan: SNF    Current Diagnoses: Patient Active Problem List   Diagnosis Date Noted  . Thrombocytopenia ([place])   . Thrush 09/22/2017  . Fever   . Pancytopenia ([place])   . Atrial fibrillation with RVR ([place])   . AML (acute myeloblastic leukemia) ([place])   . Advance care planning   . Palliative care by specialist   . AML (acute myeloid leukemia) ([place]) 09/12/2017  . Goals of care, counseling/discussion 09/12/2017  . Pressure injury of skin 09/06/2017  . DVT (deep venous thrombosis), left 03/02/2015  . DVT (deep venous thrombosis), unspecified laterality 03/02/2015  . GERD (gastroesophageal reflux disease) 03/02/2015  . Chest pain 04/20/2011  . Cervical radiculopathy 04/20/2011  . Hypertension   . Stroke ([place])   . High cholesterol     Orientation RESPIRATION BLADDER Height & Weight     Self, Situation, Place  O2(2.5 L) Incontinent Weight: 152 lb (68.9 kg) Height:  5\' 4"  (162.6 cm)  BEHAVIORAL SYMPTOMS/MOOD NEUROLOGICAL BOWEL NUTRITION STATUS      Incontinent Diet(regular diet)  AMBULATORY STATUS COMMUNICATION OF NEEDS Skin   Extensive Assist Verbally PU Stage and Appropriate Care(unstageable pressure injury buttocks- hydrotherapy (moist to dry dressings changes))                       Personal Care Assistance Level of Assistance  Bathing, Feeding, Dressing  Bathing Assistance: Maximum assistance Feeding assistance: Limited assistance Dressing Assistance: Maximum assistance     Functional Limitations Info  Sight, Hearing, Speech Sight Info: Adequate Hearing Info: Adequate Speech Info: Adequate    SPECIAL CARE FACTORS FREQUENCY  PT (By licensed PT), OT (By licensed OT)     PT Frequency: 5x OT Frequency: 5x            Contractures Contractures Info: Not present    Additional Factors Info  Code Status, Allergies Code Status Info: Full Allergies Info: Percocet Oxycodone-acetaminophen           Current Medications (09/28/2017):  This is the current hospital active medication list Current Facility-Administered Medications  Medication Dose Route Frequency Provider Last Rate Last Dose  . 0.9 %  sodium chloride infusion   Intravenous Continuous Georgette Shell, MD 50 mL/hr at 09/28/17 0044    . 0.9 %  sodium chloride infusion   Intravenous Q24H Georgette Shell, MD 75 mL/hr at 09/28/17 [phone]    . acetaminophen (TYLENOL) tablet 650 mg  650 mg Oral Q6H PRN Patrecia Pour, Christean Grief, MD   650 mg at 09/28/17 6237   Or  . acetaminophen (TYLENOL) suppository 650 mg  650 mg Rectal Q6H PRN Patrecia Pour, Christean Grief, MD   650 mg at 09/21/17 1856  . alteplase (CATHFLO ACTIVASE) injection 2 mg  2 mg Intracatheter Once PRN Patrecia Pour, Christean Grief, MD      . ceFEPIme (MAXIPIME) 2 g  in sodium chloride 0.9 % 100 mL IVPB  2 g Intravenous Q12H Shade, Christine E, RPH 200 mL/hr at 09/28/17 1017 2 g at 09/28/17 1017  . collagenase (SANTYL) ointment   Topical Daily Georgette Shell, MD      . feeding supplement (ENSURE ENLIVE) (ENSURE ENLIVE) liquid 237 mL  237 mL Oral TID BM Georgette Shell, MD   237 mL at 09/27/17 2044  . heparin lock flush 100 unit/mL  500 Units Intracatheter Once PRN Patrecia Pour, Christean Grief, MD      . heparin lock flush 100 unit/mL  250 Units Intracatheter Once PRN Patrecia Pour, Christean Grief, MD      . heparin lock flush 100 unit/mL  500  Units Intracatheter Daily PRN Brunetta Genera, MD      . heparin lock flush 100 unit/mL  250 Units Intracatheter PRN Brunetta Genera, MD      . HYDROcodone-acetaminophen (NORCO/VICODIN) 5-325 MG per tablet 1-2 tablet  1-2 tablet Oral Q4H PRN Georgette Shell, MD      . insulin aspart (novoLOG) injection 0-9 Units  0-9 Units Subcutaneous Q6H Georgette Shell, MD   2 Units at 09/28/17 [phone]  . ketorolac (ACULAR) 0.5 % ophthalmic solution 1 drop  1 drop Both Eyes QID Patrecia Pour, Christean Grief, MD   1 drop at 09/28/17 1030  . labetalol (NORMODYNE,TRANDATE) injection 5 mg  5 mg Intravenous Q2H PRN Patrecia Pour, Christean Grief, MD      . lidocaine (LIDODERM) 5 % 1 patch  1 patch Transdermal QAC breakfast Pershing Proud, NP   1 patch at 09/28/17 1029  . MEDLINE mouth rinse  15 mL Mouth Rinse BID Patrecia Pour, Christean Grief, MD   15 mL at 09/28/17 1030  . metoprolol tartrate (LOPRESSOR) tablet 25 mg  25 mg Oral BID Georgette Shell, MD   25 mg at 09/28/17 1019  . mirtazapine (REMERON) tablet 7.5 mg  7.5 mg Oral QHS Georgette Shell, MD   7.5 mg at 09/27/17 2153  . multivitamin with minerals tablet 1 tablet  1 tablet Oral Daily Patrecia Pour, Christean Grief, MD   1 tablet at 09/28/17 1019  . nystatin (MYCOSTATIN) 100000 UNIT/ML suspension 500,000 Units  5 mL Oral Q8H Georgette Shell, MD   500,000 Units at 09/28/17 [phone]  . ondansetron (ZOFRAN) injection 4 mg  4 mg Intravenous Q6H PRN Patrecia Pour, Christean Grief, MD   4 mg at 09/11/17 1229  . polyethylene glycol (MIRALAX / GLYCOLAX) packet 17 g  17 g Oral BID Patrecia Pour, Christean Grief, MD   17 g at 09/28/17 1029  . prednisoLONE acetate (PRED FORTE) 1 % ophthalmic suspension 1 drop  1 drop Both Eyes QID Patrecia Pour, Christean Grief, MD   1 drop at 09/28/17 1031  . sodium chloride flush (NS) 0.9 % injection 10 mL  10 mL Intracatheter PRN Patrecia Pour, Christean Grief, MD      . sodium chloride flush (NS) 0.9 % injection 10 mL  10 mL Intracatheter PRN Brunetta Genera, MD      . sodium  chloride flush (NS) 0.9 % injection 10-40 mL  10-40 mL Intracatheter Q12H Patrecia Pour, Christean Grief, MD   10 mL at 09/27/17 2208  . sodium chloride flush (NS) 0.9 % injection 3 mL  3 mL Intracatheter PRN Patrecia Pour, Christean Grief, MD      . venetoclax TABS 200 mg  200 mg Oral Daily Volanda Napoleon, MD   200 mg at 09/28/17 1020   Followed by  . [  START ON 09/30/2017] venetoclax TABS 400 mg  400 mg Oral Daily Volanda Napoleon, MD         Discharge Medications: Please see discharge summary for a list of discharge medications.  Relevant Imaging Results:  Relevant Lab Results:   Additional Information [phone]. on oral chemotherapy   Nila Nephew, LCSW

## 2017-09-28 NOTE — Progress Notes (Signed)
PROGRESS NOTE    Leah Lewis  IFO:[id number] DOB: 19-Jul-1934 DOA: 09/15/2017 PCP: Wenda Low, MD   Brief Narrative: Leah Lewis is a 82 y.o. female with past medical history of osteoporosis, hyperlipidemia, remote DVT on Xarelto. Patient presented secondary to generalized weakness and found to be pancytopenic secondary to AML. She has received multiple PRBC and platelet transfusions. She also developed atrial fibrillation with RVR which is currently controled. She has now been started on chemotherapy.   Assessment & Plan:   Active Problems:   Pressure injury of skin   AML (acute myeloid leukemia) (HCC)   Goals of care, counseling/discussion   AML (acute myeloblastic leukemia) ([place])   Advance care planning   Palliative care by specialist   Fever   Pancytopenia ([place])   Atrial fibrillation with RVR ([place])   Thrush   Thrombocytopenia ([place])   AML Patient started on oral chemotherapy 6/24. Afebrile. Grave prognosis per medical oncology. She has been transfused a total of 7 units PRBC and 9 units platelets. -Medical oncology recommendations: continue venetoclax -Daily labs (BMP, CBC, LDH, FOBT, phosphorus, uric acid)  Atrial fibrillation with RVR Rate controlled currently. Not on anticoagulation secondary to thrombocytopenia. Was treated with Cardizem and transitioned to metoprolol secondary to drug interaction with chemotherapy -Continue metoprolol 25 mg BID  Right middle lobe pneumonia Started on antibiotics. Stable. Afebrile. -Continue Cefepime; if fails to improve, will consider anaerobic coverage  Essential hypertension -Continue metoprolol  Unstageable sacral pressure ulcer Wound care recommended hydrotherapy. Unable to perform hydrotherapy secondary to recurrent significant thrombocytopenia  Malnutrition, unknown severity Per dietitian notes, patient may benefit from feeding tube. Recurrent thrombocytopenia makes it unsafe to place a feeding tube  currently  Oral thrush Treated with fluconazole   DVT prophylaxis: SCDs Code Status:   Code Status: Full Code Family Communication: None at bedside Disposition Plan: Discharge to SNF when medically stable   Consultants:   Medical oncology  Palliative care medicine  Procedures:   6/3: 3 units PRBC  6/10: 2 units platelets  6/14: 2 units platelets  6/19: 2 units PRBC and 2 units platelets  6/22: 1 unit plalets  6/25: 2 units PRBC and 2 units platelets  Antimicrobials:  Vancomycin  Meropenem  Fluconazole  Cefepime    Subjective: No concerns. No chest pain or dyspnea.  Objective: Vitals:   09/28/17 0016 09/28/17 0234 09/28/17 0312 09/28/17 0550  BP: 99/60  128/76 (!) 102/58  Pulse: 92  93 70  Resp: 18  14 (!) 28  Temp: 98.8 F (37.1 C)  98.7 F (37.1 C) 98.9 F (37.2 C)  TempSrc: Axillary  Axillary Axillary  SpO2: 100%   100%  Weight:  68.9 kg (152 lb)    Height:        Intake/Output Summary (Last 24 hours) at 09/28/2017 1152 Last data filed at 09/28/2017 1017 Gross per 24 hour  Intake 3594.83 ml  Output 900 ml  Net 2694.83 ml   Filed Weights   09/24/17 0452 09/27/17 0222 09/28/17 0234  Weight: 73.9 kg (162 lb 14.7 oz) 66.2 kg (146 lb) 68.9 kg (152 lb)    Examination:  General exam: Appears calm and comfortable. Temporal wasting Respiratory system: Clear to auscultation. Respiratory effort normal. Cardiovascular system: S1 & S2 heard, Irregular rhythm with normal rate. No murmurs, rubs, gallops or clicks. Gastrointestinal system: Abdomen is nondistended, soft and nontender. Normal bowel sounds heard. Central nervous system: Alert and oriented to person only. Blind. Extremities: No edema. No calf tenderness Skin:  No cyanosis. No rashes Psychiatry: Judgment appears impaired. Irritated today. Full affect.    Data Reviewed: I have personally reviewed following labs and imaging studies  CBC: Recent Labs  Lab 09/24/17 0500 09/25/17 0350  09/26/17 0336 09/27/17 0505 09/28/17 0630  WBC 11.6* 7.3 7.1 14.0* 7.5  NEUTROABS 3.0 0.9* 2.1 3.6 2.4  HGB 8.9* 8.5* 7.9* 6.8* 10.4*  HCT 27.5* 26.5* 25.3* 21.9* 31.1*  MCV 93.9 95.0 96.2 96.5 91.5  PLT 8* 55* 12* 6* 44*   Basic Metabolic Panel: Recent Labs  Lab 09/26/17 0336 09/26/17 2200 09/27/17 0505 09/27/17 1855 09/28/17 0630  NA 146* 146* 141 145 143  K 3.8 3.8 3.3* 3.1* 2.8*  CL 108 105 104 103 100  CO2 29 30 29 31  32  GLUCOSE 195* 374* 481* 268* 218*  BUN 37* 33* 30* 35* 30*  CREATININE 0.88 0.91 0.86 0.84 0.78  CALCIUM 7.9* 8.3* 7.7* 7.9* 7.6*  PHOS 2.3* 2.1* 2.7 4.0 3.4   GFR: Estimated Creatinine Clearance: 52.6 mL/min (by C-G formula based on SCr of 0.78 mg/dL). Liver Function Tests: Recent Labs  Lab 09/24/17 0500 09/25/17 0350 09/26/17 0336 09/27/17 0505 09/28/17 0630  AST 68* 46* 34 25 33  ALT 58* 49 40 31 32  ALKPHOS 67 65 61 66 72  BILITOT 2.7* 2.1* 2.2* 1.7* 2.0*  PROT 5.7* 5.7* 5.4* 5.1* 5.8*  ALBUMIN 1.8* 1.9* 1.7* 1.5* 1.7*   No results for input(s): LIPASE, AMYLASE in the last 168 hours. No results for input(s): AMMONIA in the last 168 hours. Coagulation Profile: No results for input(s): INR, PROTIME in the last 168 hours. Cardiac Enzymes: No results for input(s): CKTOTAL, CKMB, CKMBINDEX, TROPONINI in the last 168 hours. BNP (last 3 results) No results for input(s): PROBNP in the last 8760 hours. HbA1C: No results for input(s): HGBA1C in the last 72 hours. CBG: Recent Labs  Lab 09/22/17 0737 09/27/17 1201 09/27/17 1841 09/28/17 0017 09/28/17 0559  GLUCAP 216* 349* 279* 287* 194*   Lipid Profile: No results for input(s): CHOL, HDL, LDLCALC, TRIG, CHOLHDL, LDLDIRECT in the last 72 hours. Thyroid Function Tests: No results for input(s): TSH, T4TOTAL, FREET4, T3FREE, THYROIDAB in the last 72 hours. Anemia Panel: No results for input(s): VITAMINB12, FOLATE, FERRITIN, TIBC, IRON, RETICCTPCT in the last 72 hours. Sepsis Labs: No  results for input(s): PROCALCITON, LATICACIDVEN in the last 168 hours.  Recent Results (from the past 240 hour(s))  Culture, blood (Routine X 2) w Reflex to ID Panel     Status: None (Preliminary result)   Collection Time: 09/25/17  7:28 PM  Result Value Ref Range Status   Specimen Description   Final    BLOOD LEFT ANTECUBITAL Performed at [facility] [address], [place], [place] [address]    Special Requests   Final    BOTTLES DRAWN AEROBIC AND ANAEROBIC Blood Culture adequate volume Performed at [facility] [address], [place], [place] [address]    Culture   Final    NO GROWTH 1 DAY Performed at [facility], [place] [address] [place], [place] [address]    Report Status PENDING  Incomplete  Culture, blood (Routine X 2) w Reflex to ID Panel     Status: None (Preliminary result)   Collection Time: 09/25/17  7:28 PM  Result Value Ref Range Status   Specimen Description   Final    BLOOD BLOOD LEFT HAND Performed at [facility] [address]  [place], [place] [address]    Special Requests   Final    BOTTLES DRAWN AEROBIC AND ANAEROBIC Blood Culture results may not be optimal due to an inadequate volume of blood received in culture bottles Performed at [facility] [address], [place], [place] [address]    Culture   Final    NO GROWTH 1 DAY Performed at [facility], [place] [address] [place], [place] [address]    Report Status PENDING  Incomplete         Radiology Studies: Dg Chest Port 1 View  Result Date: 09/28/2017 CLINICAL DATA:  Follow-up pneumonia EXAM: PORTABLE CHEST 1 VIEW COMPARISON:  09/25/2017 FINDINGS: Cardiac shadow remains enlarged. Lungs are well aerated bilaterally. Persistent left-sided infiltrate and mild right basilar infiltrate is noted. No significant interval change is noted. No effusion is seen. Degenerative changes of the thoracic spine are noted.  Right-sided PICC line is again noted and stable. IMPRESSION: Stable bilateral infiltrates left greater than right. Electronically Signed   By: Inez Catalina M.D.   On: 09/28/2017 09:22        Scheduled Meds: . collagenase   Topical Daily  . feeding supplement (ENSURE ENLIVE)  237 mL Oral TID BM  . insulin aspart  0-9 Units Subcutaneous Q6H  . ketorolac  1 drop Both Eyes QID  . lidocaine  1 patch Transdermal QAC breakfast  . mouth rinse  15 mL Mouth Rinse BID  . metoprolol tartrate  25 mg Oral BID  . mirtazapine  7.5 mg Oral QHS  . multivitamin with minerals  1 tablet Oral Daily  . nystatin  5 mL Oral Q8H  . polyethylene glycol  17 g Oral BID  . prednisoLONE acetate  1 drop Both Eyes QID  . sodium chloride flush  10-40 mL Intracatheter Q12H  . venetoclax  200 mg Oral Daily   Followed by  . [START ON 09/30/2017] venetoclax  400 mg Oral Daily   Continuous Infusions: . sodium chloride 50 mL/hr at 09/28/17 0044  . sodium chloride 75 mL/hr at 09/28/17 0819  . ceFEPime (MAXIPIME) IV Stopped (09/28/17 1140)     LOS: 22 days     Cordelia Poche, MD [facility] 09/28/2017, 11:52 AM Pager: [phone]  If 7PM-7AM, please contact night-coverage [email or web address] 09/28/2017, 11:52 AM

## 2017-09-28 NOTE — Progress Notes (Signed)
Inpatient Diabetes Program Recommendations  AACE/ADA: New Consensus Statement on Inpatient Glycemic Control (2015)  Target Ranges:  Prepandial:   less than 140 mg/dL      Peak postprandial:   less than 180 mg/dL (1-2 hours)      Critically ill patients:  140 - 180 mg/dL   Lab Results  Component Value Date   GLUCAP 181 (H) 09/28/2017   HGBA1C (H) 04/03/2007    6.2 (NOTE)   The ADA recommends the following therapeutic goals for glycemic   control related to Hgb A1C measurement:   Goal of Therapy:   < 7.0% Hgb A1C   Action Suggested:  > 8.0% Hgb A1C   Ref:  Diabetes Care, 22, Suppl. 1, 1999    Review of Glycemic Control  CBGs 181 - 349 mg/dL over past 24H.  Inpatient Diabetes Program Recommendations:     Increase Novolog to 0-15 units Q6H. If FBS > 180 mg/dL, may benefit from small amount of basal insulin.  Will follow.  Thank you. Lorenda Peck, RD, LDN, CDE Inpatient Diabetes Coordinator [phone]

## 2017-09-28 NOTE — Progress Notes (Signed)
Patient is refusing all meds, tried several times to offer meds to her.

## 2017-09-29 LAB — BASIC METABOLIC PANEL
ANION GAP: 12 (ref 5–15)
BUN: 28 mg/dL — AB (ref 8–23)
CHLORIDE: 102 mmol/L (ref 98–111)
CO2: 29 mmol/L (ref 22–32)
Calcium: 7.2 mg/dL — ABNORMAL LOW (ref 8.9–10.3)
Creatinine, Ser: 0.84 mg/dL (ref 0.44–1.00)
Glucose, Bld: 372 mg/dL — ABNORMAL HIGH (ref 70–99)
Potassium: 2.8 mmol/L — ABNORMAL LOW (ref 3.5–5.1)
SODIUM: 143 mmol/L (ref 135–145)

## 2017-09-29 LAB — PHOSPHORUS
PHOSPHORUS: 3.2 mg/dL (ref 2.5–4.6)
PHOSPHORUS: 2.8 mg/dL (ref 2.5–4.6)

## 2017-09-29 LAB — CBC WITH DIFFERENTIAL/PLATELET
BAND NEUTROPHILS: 4 %
BASOS PCT: 0 %
Basophils Absolute: 0 10*3/uL (ref 0.0–0.1)
Blasts: 10 %
EOS ABS: 0 10*3/uL (ref 0.0–0.7)
Eosinophils Relative: 0 %
HCT: 31.9 % — ABNORMAL LOW (ref 36.0–46.0)
HEMOGLOBIN: 10.6 g/dL — AB (ref 12.0–15.0)
LYMPHS PCT: 9 %
Lymphs Abs: 0.6 10*3/uL — ABNORMAL LOW (ref 0.7–4.0)
MCH: 30.4 pg (ref 26.0–34.0)
MCHC: 33.2 g/dL (ref 30.0–36.0)
MCV: 91.4 fL (ref 78.0–100.0)
MONO ABS: 1.8 10*3/uL — AB (ref 0.1–1.0)
Metamyelocytes Relative: 0 %
Monocytes Relative: 27 %
Myelocytes: 17 %
NEUTROS ABS: 3.7 10*3/uL (ref 1.7–7.7)
Neutrophils Relative %: 33 %
Other: 0 %
PROMYELOCYTES RELATIVE: 0 %
Platelets: 12 10*3/uL — CL (ref 150–400)
RBC: 3.49 MIL/uL — ABNORMAL LOW (ref 3.87–5.11)
RDW: 16.9 % — AB (ref 11.5–15.5)
WBC: 6.8 10*3/uL (ref 4.0–10.5)
nRBC: 0 /100 WBC

## 2017-09-29 LAB — COMPREHENSIVE METABOLIC PANEL
ALK PHOS: 81 U/L (ref 38–126)
ALT: 33 U/L (ref 0–44)
AST: 29 U/L (ref 15–41)
Albumin: 1.7 g/dL — ABNORMAL LOW (ref 3.5–5.0)
Anion gap: 10 (ref 5–15)
BILIRUBIN TOTAL: 2.9 mg/dL — AB (ref 0.3–1.2)
BUN: 26 mg/dL — AB (ref 8–23)
CALCIUM: 7.7 mg/dL — AB (ref 8.9–10.3)
CHLORIDE: 104 mmol/L (ref 98–111)
CO2: 31 mmol/L (ref 22–32)
CREATININE: 0.78 mg/dL (ref 0.44–1.00)
GFR calc Af Amer: >60 mL/min (ref >60)
Glucose, Bld: 192 mg/dL — ABNORMAL HIGH (ref 70–99)
Potassium: 3 mmol/L — ABNORMAL LOW (ref 3.5–5.1)
Sodium: 145 mmol/L (ref 135–145)
TOTAL PROTEIN: 6 g/dL — AB (ref 6.5–8.1)

## 2017-09-29 LAB — GLUCOSE, CAPILLARY
GLUCOSE-CAPILLARY: 184 mg/dL — AB (ref 70–99)
Glucose-Capillary: 173 mg/dL — ABNORMAL HIGH (ref 70–99)
Glucose-Capillary: 212 mg/dL — ABNORMAL HIGH (ref 70–99)
Glucose-Capillary: 226 mg/dL — ABNORMAL HIGH (ref 70–99)
Glucose-Capillary: 360 mg/dL — ABNORMAL HIGH (ref 70–99)

## 2017-09-29 LAB — LACTATE DEHYDROGENASE: LDH: 482 U/L — ABNORMAL HIGH (ref 98–192)

## 2017-09-29 LAB — URIC ACID
URIC ACID, SERUM: 3.5 mg/dL (ref 2.5–7.1)
Uric Acid, Serum: 3.6 mg/dL (ref 2.5–7.1)

## 2017-09-29 NOTE — Progress Notes (Signed)
HYDROTHERAPY TREATMENT    09/29/17 1200  Subjective Assessment  Subjective  (I'm cold)  Patient and Family Stated Goals none stated, no family present  Prior Treatments dressing changes per nursing  Evaluation and Treatment  Evaluation and Treatment Procedures Explained to Patient/Family Yes  Evaluation and Treatment Procedures Patient unable to consent due to mental status  Pressure Injury 09/26/17 Unstageable - Full thickness tissue loss in which the base of the ulcer is covered by slough (yellow, tan, gray, green or brown) and/or eschar (tan, brown or black) in the wound bed. *PT ONLY* sacral pressure injury  Date First Assessed/Time First Assessed: 09/26/17 1130   Location: Sacrum  Location Orientation: Mid  Staging: Unstageable - Full thickness tissue loss in which the base of the ulcer is covered by slough (yellow, tan, gray, green or brown) and/or esch...  Dressing Type Barrier Film (skin prep);Moist to dry (Santyl)  Dressing Changed  Dressing Change Frequency PRN  Site / Wound Assessment Brown;Yellow  % Wound base Red or Granulating 0%  % Wound base Yellow/Fibrinous Exudate 100% (yellow/brown)  Peri-wound Assessment Erythema (blanchable);Maceration  Margins Unattached edges (unapproximated)  Drainage Amount Minimal  Drainage Description Purulent  Treatment Hydrotherapy (Pulse lavage);Packing (Saline gauze) (Enzymatic debridement)  Hydrotherapy  Pulsed lavage therapy - wound location sacrum  Pulsed Lavage with Suction (psi) 4 psi  Pulsed Lavage with Suction - Normal Saline Used 1000 mL  Pulsed Lavage Tip Tip with splash shield  Wound Therapy - Assess/Plan/Recommendations  Wound Therapy - Clinical Statement Pt with unstageable pressure injury to sacrum, unable to visualize wound base d/t slough, purulent odor present;  pt will benefit from hydrotherapy to aid in healing and decr bioburden. 6/27 Appears to be little to no progress since evaluation. Will continue to follow for  now. May need to have a discussion with MD/WOC nurse sometime next week.   Wound Therapy - Functional Problem List limited mobility  Factors Delaying/Impairing Wound Healing Incontinence;Immobility  Hydrotherapy Plan Dressing change;Patient/family education;Pulsatile lavage with suction;Debridement  Wound Therapy - Frequency 6X / week  Wound Therapy - Current Recommendations Case manager/social work;WOC nurse  Wound Therapy - Follow Up Recommendations Skilled nursing facility  Wound Plan hydrotherapy 6x/wk; caution used with frequent wound checks during pls lavage with light suction/4psi used today d/t pt platelet count low with pt at risk for bleeding;   Wound Therapy Goals - Improve the function of patient's integumentary system by progressing the wound(s) through the phases of wound healing by:  Decrease Necrotic Tissue to 75  Decrease Necrotic Tissue - Progress Not Progressing toward goal  Increase Granulation Tissue to 25  Increase Granulation Tissue - Progress Not Progressing toward goal  Goals/treatment plan/discharge plan were made with and agreed upon by patient/family No, Patient unable to participate in goals/treatment/discharge plan and family unavailable  Time For Goal Achievement 2 weeks  Wound Therapy - Potential for Goals Poor   Weston Anna, MPT [phone]

## 2017-09-29 NOTE — Progress Notes (Signed)
So far, Leah Lewis blood counts are looking good.  I do not have her CBC back yet from today.  However, yesterday, her white cell count was 7.5.  Hemoglobin 10.4 and platelet count 44,000.  Her LDH continues to decrease.  It was 100 yesterday.  The Venetoclax is doing well.  There is no evidence of tumor lysis.  I doubt that this will ever happen.  Is just nice to see that her blood counts are doing well.  It is still not clear or how much she is eating.  She may need to have a calorie count done.  Her blood sugars are still on the high side.  I guess this probably is from the IV fluid that she is on.  May be, the IV fluid can be switched to something with less sugar water.  She still has the decubiti on her sacrum.  This is being managed by wound care/physical therapy.  I have to be encouraged by her blood counts.  I think the decitabine/venetoclax combination is working quite nicely so far.  I would like to think that if she continues to stabilize, that she will be able to go to rehab or skilled nursing.  Her temperature is still down.  Her chest x-ray from yesterday shows stable bilateral infiltrates.  We will continue to follow along.  I think the real important aspect of her care right now is nutrition.  [name], MD  Hebrews 12:12

## 2017-09-29 NOTE — Progress Notes (Addendum)
Pharmacy Antibiotic Note  Leah Lewis is a 83 y.o. female admitted on 09/25/2017 with AML getting chemotherapy and already completed a course of antibiotics for possible infected decubitus ulcer.  Now,  Pharmacy has been consulted for Cefepime dosing for febrile neutropenia/ pneumonia.  Afebrile currently, Tmax 100 WBC 6.8 ANC 3.7 SCr 0.78  Plan:  Cefepime 2g IV q12h  Follow up renal fxn, culture results, and clinical course  F/u LOT    Height: 5\' 4"  (162.6 cm) Weight: 152 lb (68.9 kg) IBW/kg (Calculated) : 54.7  Temp (24hrs), Avg:98.5 F (36.9 C), Min:97.6 F (36.4 C), Max:100 F (37.8 C)  Recent Labs  Lab 09/25/17 0350 09/26/17 0336  09/27/17 0505 09/27/17 1855 09/28/17 0630 09/28/17 1740 09/29/17 0650  WBC 7.3 7.1  --  14.0*  --  7.5  --  6.8  CREATININE 0.76 0.88   < > 0.86 0.84 0.78 0.78 0.78   < > = values in this interval not displayed.    Estimated Creatinine Clearance: 52.6 mL/min (by C-G formula based on SCr of 0.78 mg/dL).    Allergies  Allergen Reactions  . Percocet [Oxycodone-Acetaminophen] Other (See Comments)    "patient can take" but prefers not to    Antimicrobials this admission: 6/8 cefepime >> 6/14, resumed 6/24 >>  6/10 vancomycin >> 6/14 6/14 meropenem >> 6/18 6/19 fluconazole (thrush) >> 6/24 (DC d/t drug-drug interaction with Venetoclax, continue nystatin alone)  Dose adjustments this admission:   Microbiology results: 6/3 BCx: NGF 6/8 BCx: NGF 6/10 BCx: NGF 6/10 MRSA PCR: negative  6/15 Bcx: NGF 6/23 BCx: NGTD   Jalene Mullet, Pharm.D. PGY1 Pharmacy Resident 09/29/2017 9:55 AM

## 2017-09-29 NOTE — Progress Notes (Signed)
Physical Therapy Treatment Patient Details Name: Leah Lewis MRN: [id number] DOB: [birth year] Today's Date: 09/29/2017    History of Present Illness 83 y.o. female with medical history significant of hypertension, prior stroke hx of left DVT.  Patient presented to the emergency department complaining of generalized weakness and fatigue. Dx of fever,anemia.  New diagnosis of AML-oncology following    PT Comments    Pt continues to require total assist for bed mobility. Multimodal cueing required. Pt remains weak and confused. Continue to recommend SNF.    Follow Up Recommendations  SNF     Equipment Recommendations  None recommended by PT    Recommendations for Other Services       Precautions / Restrictions Precautions Precautions: Fall Precaution Comments: legally blind Restrictions Weight Bearing Restrictions: No    Mobility  Bed Mobility Overal bed mobility: Needs Assistance Bed Mobility: Rolling Rolling: Total assist         General bed mobility comments: Total assist for rolling to L and R side on today. Multimodal cueing required. Utilized bedpad to aid with positioning.   Transfers                 General transfer comment: NT on today  Ambulation/Gait                 Stairs             Wheelchair Mobility    Modified Rankin (Stroke Patients Only)       Balance                                            Cognition Arousal/Alertness: Awake/alert Behavior During Therapy: WFL for tasks assessed/performed Overall Cognitive Status: Impaired/Different from baseline Area of Impairment: Orientation;Problem solving;Safety/judgement                 Orientation Level: Disoriented to;Place;Time;Situation     Following Commands: Follows one step commands inconsistently     Problem Solving: Requires tactile cues;Requires verbal cues;Difficulty sequencing;Decreased initiation;Slow processing         Exercises      General Comments        Pertinent Vitals/Pain Faces Pain Scale: No hurt    Home Living                      Prior Function            PT Goals (current goals can now be found in the care plan section) Progress towards PT goals: Not progressing toward goals - comment    Frequency    Min 2X/week      PT Plan Current plan remains appropriate    Co-evaluation              AM-PAC PT "6 Clicks" Daily Activity  Outcome Measure  Difficulty turning over in bed (including adjusting bedclothes, sheets and blankets)?: Unable Difficulty moving from lying on back to sitting on the side of the bed? : Unable Difficulty sitting down on and standing up from a chair with arms (e.g., wheelchair, bedside commode, etc,.)?: Unable Help needed moving to and from a bed to chair (including a wheelchair)?: Total Help needed walking in hospital room?: Total Help needed climbing 3-5 steps with a railing? : Total 6 Click Score: 6    End of Session   Activity Tolerance: Patient tolerated  treatment well Patient left: in bed;with call bell/phone within reach   PT Visit Diagnosis: Difficulty in walking, not elsewhere classified (R26.2);Muscle weakness (generalized) (M62.81);Other abnormalities of gait and mobility (R26.89)     Time: 8472-0721 PT Time Calculation (min) (ACUTE ONLY): 8 min  Charges:  $Therapeutic Activity: 8-22 mins                    G Codes:        Weston Anna, MPT Pager: [phone]

## 2017-09-29 NOTE — Progress Notes (Signed)
PROGRESS NOTE    Leah Lewis  BTD:[id number] DOB: Mar 10, 1935 DOA: 09/25/2017 PCP: Wenda Low, MD   Brief Narrative: Leah Lewis is a 83 y.o. female with past medical history of osteoporosis, hyperlipidemia, remote DVT on Xarelto. Patient presented secondary to generalized weakness and found to be pancytopenic secondary to AML. She has received multiple PRBC and platelet transfusions. She also developed atrial fibrillation with RVR which is currently controled. She has now been started on chemotherapy.   Assessment & Plan:   Active Problems:   Pressure injury of skin   AML (acute myeloid leukemia) (HCC)   Goals of care, counseling/discussion   AML (acute myeloblastic leukemia) ([place])   Advance care planning   Palliative care by specialist   Fever   Pancytopenia ([place])   Atrial fibrillation with RVR ([place])   Thrush   Thrombocytopenia ([place])   AML Patient started on oral chemotherapy 6/24. Afebrile. Grave prognosis per medical oncology. She has been transfused a total of 7 units PRBC and 9 units platelets. Platelets down again today to 12k -Medical oncology recommendations: continue venetoclax, transfuse for platelets <10k -Daily labs (BMP, CBC, LDH, FOBT, phosphorus, uric acid)  Atrial fibrillation with RVR Rate controlled currently. Not on anticoagulation secondary to thrombocytopenia. Was treated with Cardizem and transitioned to metoprolol secondary to drug interaction with chemotherapy -Continue metoprolol 25 mg BID  Right middle lobe pneumonia Started on antibiotics. Stable. Afebrile. -Continue Cefepime  Essential hypertension Stable. Well controlled. -Continue metoprolol  Unstageable sacral pressure ulcer Wound care recommended hydrotherapy. Unable to perform hydrotherapy secondary to recurrent significant thrombocytopenia  Malnutrition, unknown severity Per dietitian notes, patient may benefit from feeding tube. Recurrent thrombocytopenia makes it unsafe  to place a feeding tube currently. Patient will be encouraged to eat more today  Oral thrush Treated with fluconazole   DVT prophylaxis: SCDs Code Status:   Code Status: Full Code Family Communication: None at bedside Disposition Plan: Discharge to SNF when medically stable   Consultants:   Medical oncology  Palliative care medicine  Procedures:   6/3: 3 units PRBC  6/10: 2 units platelets  6/14: 2 units platelets  6/19: 2 units PRBC and 2 units platelets  6/22: 1 unit plalets  6/25: 2 units PRBC and 2 units platelets  Antimicrobials:  Vancomycin  Meropenem  Fluconazole  Cefepime    Subjective: No bleeding.  Objective: Vitals:   09/28/17 1541 09/28/17 1543 09/28/17 2117 09/29/17 0615  BP: 110/62  (!) 148/97 109/66  Pulse: 100  85 (!) 112  Resp: 17  15 16   Temp:  97.6 F (36.4 C) 100 F (37.8 C) 97.9 F (36.6 C)  TempSrc: Oral Oral Axillary Oral  SpO2: 99%  99% 100%  Weight:      Height:        Intake/Output Summary (Last 24 hours) at 09/29/2017 1145 Last data filed at 09/29/2017 0600 Gross per 24 hour  Intake 1055.83 ml  Output 2201 ml  Net -1145.17 ml   Filed Weights   09/24/17 0452 09/27/17 0222 09/28/17 0234  Weight: 73.9 kg (162 lb 14.7 oz) 66.2 kg (146 lb) 68.9 kg (152 lb)    Examination:  General exam: Appears calm and comfortable Respiratory system: Clear to auscultation. Respiratory effort normal. Cardiovascular system: S1 & S2 heard, irregular rhythm with normal rate. No murmurs, rubs, gallops or clicks. Gastrointestinal system: Abdomen is nondistended, soft and nontender. Normal bowel sounds heard. Central nervous system: Alert and oriented to person. Blind Extremities: No edema. No calf  tenderness Skin: No cyanosis. No rashes Psychiatry: Judgement and insight appear impaired. Flat affect    Data Reviewed: I have personally reviewed following labs and imaging studies  CBC: Recent Labs  Lab 09/25/17 0350 09/26/17 0336  09/27/17 0505 09/28/17 0630 09/29/17 0650  WBC 7.3 7.1 14.0* 7.5 6.8  NEUTROABS 0.9* 2.1 3.6 2.4 3.7  HGB 8.5* 7.9* 6.8* 10.4* 10.6*  HCT 26.5* 25.3* 21.9* 31.1* 31.9*  MCV 95.0 96.2 96.5 91.5 91.4  PLT 55* 12* 6* 44* 12*   Basic Metabolic Panel: Recent Labs  Lab 09/27/17 0505 09/27/17 1855 09/28/17 0630 09/28/17 1740 09/29/17 0650  NA 141 145 143 142 145  K 3.3* 3.1* 2.8* 3.7 3.0*  CL 104 103 100 102 104  CO2 29 31 32 30 31  GLUCOSE 481* 268* 218* 232* 192*  BUN 30* 35* 30* 27* 26*  CREATININE 0.86 0.84 0.78 0.78 0.78  CALCIUM 7.7* 7.9* 7.6* 7.4* 7.7*  PHOS 2.7 4.0 3.4 3.3 3.2   GFR: Estimated Creatinine Clearance: 52.6 mL/min (by C-G formula based on SCr of 0.78 mg/dL). Liver Function Tests: Recent Labs  Lab 09/25/17 0350 09/26/17 0336 09/27/17 0505 09/28/17 0630 09/29/17 0650  AST 46* 34 25 33 29  ALT 49 40 31 32 33  ALKPHOS 65 61 66 72 81  BILITOT 2.1* 2.2* 1.7* 2.0* 2.9*  PROT 5.7* 5.4* 5.1* 5.8* 6.0*  ALBUMIN 1.9* 1.7* 1.5* 1.7* 1.7*   No results for input(s): LIPASE, AMYLASE in the last 168 hours. No results for input(s): AMMONIA in the last 168 hours. Coagulation Profile: No results for input(s): INR, PROTIME in the last 168 hours. Cardiac Enzymes: No results for input(s): CKTOTAL, CKMB, CKMBINDEX, TROPONINI in the last 168 hours. BNP (last 3 results) No results for input(s): PROBNP in the last 8760 hours. HbA1C: No results for input(s): HGBA1C in the last 72 hours. CBG: Recent Labs  Lab 09/28/17 0559 09/28/17 1212 09/28/17 1818 09/29/17 0053 09/29/17 0615  GLUCAP 194* 181* 213* 212* 184*   Lipid Profile: No results for input(s): CHOL, HDL, LDLCALC, TRIG, CHOLHDL, LDLDIRECT in the last 72 hours. Thyroid Function Tests: No results for input(s): TSH, T4TOTAL, FREET4, T3FREE, THYROIDAB in the last 72 hours. Anemia Panel: No results for input(s): VITAMINB12, FOLATE, FERRITIN, TIBC, IRON, RETICCTPCT in the last 72 hours. Sepsis Labs: No  results for input(s): PROCALCITON, LATICACIDVEN in the last 168 hours.  Recent Results (from the past 240 hour(s))  Culture, blood (Routine X 2) w Reflex to ID Panel     Status: None (Preliminary result)   Collection Time: 09/25/17  7:28 PM  Result Value Ref Range Status   Specimen Description   Final    BLOOD LEFT ANTECUBITAL Performed at [facility] [address], [place], [place] [address]    Special Requests   Final    BOTTLES DRAWN AEROBIC AND ANAEROBIC Blood Culture adequate volume Performed at [facility] [address] [place], [place] [address]    Culture   Final    NO GROWTH 3 DAYS Performed at [facility], [place] [address] [place], [place] [address]    Report Status PENDING  Incomplete  Culture, blood (Routine X 2) w Reflex to ID Panel     Status: None (Preliminary result)   Collection Time: 09/25/17  7:28 PM  Result Value Ref Range Status   Specimen Description   Final    BLOOD BLOOD LEFT HAND Performed at [facility]  [address], [place], [place] [address]    Special Requests   Final    BOTTLES DRAWN AEROBIC AND ANAEROBIC Blood Culture results may not be optimal due to an inadequate volume of blood received in culture bottles Performed at [facility] [address] [place], [place] [address]    Culture   Final    NO GROWTH 3 DAYS Performed at [facility], [place] [address] [place], [place] [address]    Report Status PENDING  Incomplete         Radiology Studies: Dg Chest Port 1 View  Result Date: 09/28/2017 CLINICAL DATA:  Follow-up pneumonia EXAM: PORTABLE CHEST 1 VIEW COMPARISON:  09/25/2017 FINDINGS: Cardiac shadow remains enlarged. Lungs are well aerated bilaterally. Persistent left-sided infiltrate and mild right basilar infiltrate is noted. No significant interval change is noted. No effusion is seen. Degenerative changes of the thoracic spine are noted.  Right-sided PICC line is again noted and stable. IMPRESSION: Stable bilateral infiltrates left greater than right. Electronically Signed   By: Inez Catalina M.D.   On: 09/28/2017 09:22        Scheduled Meds: . collagenase   Topical Daily  . feeding supplement (ENSURE ENLIVE)  237 mL Oral TID BM  . insulin aspart  0-9 Units Subcutaneous Q6H  . ketorolac  1 drop Both Eyes QID  . lidocaine  1 patch Transdermal QAC breakfast  . mouth rinse  15 mL Mouth Rinse BID  . metoprolol tartrate  25 mg Oral BID  . mirtazapine  7.5 mg Oral QHS  . multivitamin with minerals  1 tablet Oral Daily  . nystatin  5 mL Oral Q8H  . polyethylene glycol  17 g Oral BID  . prednisoLONE acetate  1 drop Both Eyes QID  . sodium chloride flush  10-40 mL Intracatheter Q12H  . venetoclax  200 mg Oral Daily   Followed by  . [START ON 09/30/2017] venetoclax  400 mg Oral Daily   Continuous Infusions: . sodium chloride 50 mL/hr at 09/29/17 0635  . sodium chloride 75 mL/hr at 09/29/17 0825  . ceFEPime (MAXIPIME) IV 2 g (09/29/17 1055)     LOS: 23 days     Cordelia Poche, MD [facility] 09/29/2017, 11:45 AM Pager: [phone]  If 7PM-7AM, please contact night-coverage [email or web address] 09/29/2017, 11:45 AM

## 2017-09-29 NOTE — Progress Notes (Signed)
Occupational Therapy Treatment Patient Details Name: Leah Lewis MRN: [id number] DOB: September 10, 1934 Today's Date: 09/29/2017    History of present illness 83 y.o. female with medical history significant of hypertension, prior stroke hx of left DVT.  Patient presented to the emergency department complaining of generalized weakness and fatigue. Dx of fever,anemia.  New diagnosis of AML-oncology following   OT comments  No family present  Follow Up Recommendations  SNF    Equipment Recommendations  None recommended by OT    Recommendations for Other Services      Precautions / Restrictions Precautions Precautions: Fall Precaution Comments: reports no h/o falls in past 1 year; pt is legally blind but can see objects Restrictions Weight Bearing Restrictions: No       Mobility Bed Mobility Overal bed mobility: Needs Assistance Bed Mobility: Rolling Rolling: Total assist         General bed mobility comments: Total assist for rolling to L and R side on today. Multimodal cueing required. Utilized bedpad to aid with positioning.   Transfers                 General transfer comment: NT on today        ADL either performed or assessed with clinical judgement   ADL Overall ADL's : Needs assistance/impaired     Grooming: Wash/dry face;Maximal assistance;Bed level;Wash/dry hands                                 General ADL Comments: encouraged use of BUE for grooming activity. pt more participative and awake this day.  Encouraged AAROM of BUE in supine position.  Pt has on glasses for OT session               Cognition Arousal/Alertness: Awake/alert Behavior During Therapy: WFL for tasks assessed/performed Overall Cognitive Status: Impaired/Different from baseline Area of Impairment: Orientation;Problem solving;Safety/judgement                 Orientation Level: Disoriented to;Place;Time;Situation     Following Commands: Follows one  step commands inconsistently     Problem Solving: Requires tactile cues;Requires verbal cues;Difficulty sequencing;Decreased initiation;Slow processing          Exercises Shoulder Exercises Shoulder Flexion: AAROM;10 reps;Both;Supine Digit Composite Flexion: AROM;10 reps;Supine;Both   Shoulder Instructions            Pertinent Vitals/ Pain       Faces Pain Scale: No hurt     Prior Functioning/Environment              Frequency  Min 2X/week        Progress Toward Goals  OT Goals(current goals can now be found in the care plan section)  Progress towards OT goals: Progressing toward goals     Plan Discharge plan remains appropriate    Co-evaluation                 AM-PAC PT "6 Clicks" Daily Activity     Outcome Measure   Help from another person eating meals?: Total Help from another person taking care of personal grooming?: A Lot Help from another person toileting, which includes using toliet, bedpan, or urinal?: Total Help from another person bathing (including washing, rinsing, drying)?: Total Help from another person to put on and taking off regular upper body clothing?: Total Help from another person to put on and taking off regular lower body clothing?: Total 6  Click Score: 7    End of Session    OT Visit Diagnosis: Unsteadiness on feet (R26.81);Muscle weakness (generalized) (M62.81);Other abnormalities of gait and mobility (R26.89)   Activity Tolerance Patient tolerated treatment well   Patient Left in bed;with call bell/phone within reach;with bed alarm set   Nurse Communication Mobility status        Time: 1255-1310 OT Time Calculation (min): 15 min  Charges: OT General Charges $OT Visit: 1 Visit OT Treatments $Self Care/Home Management : 8-22 mins  [place], [place]   [name] 09/29/2017, 2:29 PM

## 2017-09-30 DIAGNOSIS — J181 Lobar pneumonia, unspecified organism: Secondary | ICD-10-CM

## 2017-09-30 LAB — COMPREHENSIVE METABOLIC PANEL
ALK PHOS: 76 U/L (ref 38–126)
ALT: 29 U/L (ref 0–44)
AST: 28 U/L (ref 15–41)
Albumin: 1.5 g/dL — ABNORMAL LOW (ref 3.5–5.0)
Anion gap: 11 (ref 5–15)
BILIRUBIN TOTAL: 2.5 mg/dL — AB (ref 0.3–1.2)
BUN: 24 mg/dL — AB (ref 8–23)
CALCIUM: 7.6 mg/dL — AB (ref 8.9–10.3)
CO2: 31 mmol/L (ref 22–32)
CREATININE: 0.77 mg/dL (ref 0.44–1.00)
Chloride: 105 mmol/L (ref 98–111)
GFR calc Af Amer: >60 mL/min (ref >60)
Glucose, Bld: 189 mg/dL — ABNORMAL HIGH (ref 70–99)
POTASSIUM: 2.5 mmol/L — AB (ref 3.5–5.1)
Sodium: 147 mmol/L — ABNORMAL HIGH (ref 135–145)
TOTAL PROTEIN: 5.5 g/dL — AB (ref 6.5–8.1)

## 2017-09-30 LAB — GLUCOSE, CAPILLARY
GLUCOSE-CAPILLARY: 153 mg/dL — AB (ref 70–99)
GLUCOSE-CAPILLARY: 188 mg/dL — AB (ref 70–99)
Glucose-Capillary: 238 mg/dL — ABNORMAL HIGH (ref 70–99)
Glucose-Capillary: 264 mg/dL — ABNORMAL HIGH (ref 70–99)
Glucose-Capillary: 177 mg/dL — ABNORMAL HIGH (ref 70–99)

## 2017-09-30 LAB — CBC WITH DIFFERENTIAL/PLATELET
BAND NEUTROPHILS: 2 %
BASOS ABS: 0 10*3/uL (ref 0.0–0.1)
Basophils Relative: 0 %
Blasts: 10 %
EOS ABS: 0 10*3/uL (ref 0.0–0.7)
Eosinophils Relative: 0 %
HEMATOCRIT: 29.6 % — AB (ref 36.0–46.0)
HEMOGLOBIN: 9.7 g/dL — AB (ref 12.0–15.0)
LYMPHS PCT: 13 %
Lymphs Abs: 0.6 10*3/uL — ABNORMAL LOW (ref 0.7–4.0)
MCH: 30 pg (ref 26.0–34.0)
MCHC: 32.8 g/dL (ref 30.0–36.0)
MCV: 91.6 fL (ref 78.0–100.0)
METAMYELOCYTES PCT: 4 %
MONO ABS: 1.4 10*3/uL — AB (ref 0.1–1.0)
MONOS PCT: 30 %
Myelocytes: 14 %
NEUTROS PCT: 27 %
Neutro Abs: 2.3 10*3/uL (ref 1.7–7.7)
Platelets: 7 10*3/uL — CL (ref 150–400)
RBC: 3.23 MIL/uL — ABNORMAL LOW (ref 3.87–5.11)
RDW: 16.6 % — AB (ref 11.5–15.5)
WBC: 4.8 10*3/uL (ref 4.0–10.5)

## 2017-09-30 LAB — BASIC METABOLIC PANEL
ANION GAP: 9 (ref 5–15)
BUN: 29 mg/dL — ABNORMAL HIGH (ref 8–23)
CALCIUM: 7.3 mg/dL — AB (ref 8.9–10.3)
CHLORIDE: 107 mmol/L (ref 98–111)
CO2: 32 mmol/L (ref 22–32)
Creatinine, Ser: 0.79 mg/dL (ref 0.44–1.00)
GFR calc non Af Amer: >60 mL/min (ref >60)
Glucose, Bld: 283 mg/dL — ABNORMAL HIGH (ref 70–99)
Potassium: 3.6 mmol/L (ref 3.5–5.1)
SODIUM: 148 mmol/L — AB (ref 135–145)

## 2017-09-30 LAB — PHOSPHORUS
PHOSPHORUS: 2.9 mg/dL (ref 2.5–4.6)
PHOSPHORUS: 2.7 mg/dL (ref 2.5–4.6)

## 2017-09-30 LAB — URIC ACID
URIC ACID, SERUM: 3.6 mg/dL (ref 2.5–7.1)
URIC ACID, SERUM: 3.7 mg/dL (ref 2.5–7.1)

## 2017-09-30 LAB — LACTATE DEHYDROGENASE: LDH: 479 U/L — ABNORMAL HIGH (ref 98–192)

## 2017-09-30 MED ORDER — POTASSIUM CHLORIDE 10 MEQ/50ML IV SOLN
10.0000 meq | INTRAVENOUS | Status: AC
  Administered 2017-09-30 (×6): 10 meq via INTRAVENOUS
  Filled 2017-09-30 (×6): qty 50

## 2017-09-30 MED ORDER — SODIUM CHLORIDE 0.9% IV SOLUTION
Freq: Once | INTRAVENOUS | Status: AC
  Administered 2017-09-30: 12:00:00 via INTRAVENOUS

## 2017-09-30 MED ORDER — MIRTAZAPINE 15 MG PO TABS
15.0000 mg | ORAL_TABLET | Freq: Every day | ORAL | Status: DC
  Administered 2017-09-30 – 2017-10-10 (×8): 15 mg via ORAL
  Filled 2017-09-30 (×11): qty 1

## 2017-09-30 MED ORDER — METOPROLOL TARTRATE 50 MG PO TABS
50.0000 mg | ORAL_TABLET | Freq: Two times a day (BID) | ORAL | Status: DC
  Administered 2017-09-30 – 2017-10-02 (×5): 50 mg via ORAL
  Filled 2017-09-30 (×6): qty 1

## 2017-09-30 NOTE — Progress Notes (Signed)
Daily Progress Note   Patient Name: Leah Lewis       Date: 09/30/2017 DOB: Jul 09, 1934  Age: 83 y.o. MRN#: [id number] Attending Physician: Mariel Aloe, MD Primary Care Physician: Wenda Low, MD Admit Date: 09/29/2017  Reason for Consultation/Follow-up: Establishing goals of care  Subjective: Patient in bed with blanket pulled over her head. Only states- "my name is Leah Lewis to questions". Per RN patient is refusing and pocketing pills at times.  Spoke with patient's daughterJoycelyn Schmid via phone before and after eval- see previous note for my phone conversation with her this morning.   Review of Systems  Unable to perform ROS: Dementia    Length of Stay: 24  Current Medications: Scheduled Meds:  . collagenase   Topical Daily  . feeding supplement (ENSURE ENLIVE)  237 mL Oral TID BM  . insulin aspart  0-9 Units Subcutaneous Q6H  . ketorolac  1 drop Both Eyes QID  . lidocaine  1 patch Transdermal QAC breakfast  . mouth rinse  15 mL Mouth Rinse BID  . metoprolol tartrate  50 mg Oral BID  . mirtazapine  15 mg Oral QHS  . multivitamin with minerals  1 tablet Oral Daily  . nystatin  5 mL Oral Q8H  . polyethylene glycol  17 g Oral BID  . prednisoLONE acetate  1 drop Both Eyes QID  . sodium chloride flush  10-40 mL Intracatheter Q12H  . venetoclax  400 mg Oral Daily    Continuous Infusions: . sodium chloride 50 mL/hr at 09/30/17 0558  . sodium chloride Stopped (09/30/17 1229)  . ceFEPime (MAXIPIME) IV Stopped (09/30/17 1042)    PRN Meds: acetaminophen **OR** acetaminophen, alteplase, heparin lock flush, heparin lock flush, heparin lock flush, heparin lock flush, HYDROcodone-acetaminophen, labetalol, ondansetron (ZOFRAN) IV, sodium chloride flush, sodium chloride  flush, sodium chloride flush  Physical Exam  Constitutional:  cachetic  Neurological:  Oriented to self- no answer to other questions  Nursing note and vitals reviewed.           Vital Signs: BP 119/88 (BP Location: Left Arm)   Pulse (!) 125   Temp 97.8 F (36.6 C) (Oral)   Resp 18   Ht 5\' 4"  (1.626 m)   Wt 66.7 kg (147 lb)   SpO2 98%   BMI 25.23 kg/m  SpO2: SpO2: 98 % O2 Device: O2 Device: Nasal Cannula O2 Flow Rate: O2 Flow Rate (L/min): 2 L/min  Intake/output summary:   Intake/Output Summary (Last 24 hours) at 09/30/2017 1640 Last data filed at 09/30/2017 1345 Gross per 24 hour  Intake 390 ml  Output 2000 ml  Net -1610 ml   LBM: Last BM Date: 09/29/17 Baseline Weight: Weight: 68.9 kg (152 lb) Most recent weight: Weight: 66.7 kg (147 lb)       Palliative Assessment/Data: PPS: 20%    Flowsheet Rows     Most Recent Value  Intake Tab  Referral Department  Hospitalist  Unit at Time of Referral  Med/Surg Unit  Palliative Care Primary Diagnosis  Neurology  Date Notified  09/15/17  Palliative Care Type  New Palliative care  Reason for referral  Clarify Goals of Care  Date of Admission  09/15/2017  Date first seen by Palliative Care  09/16/17  # of days Palliative referral response time  1 Day(s)  # of days IP prior to Palliative referral  10  Clinical Assessment  Psychosocial & Spiritual Assessment  Palliative Care Outcomes      Patient Active Problem List   Diagnosis Date Noted  . Thrombocytopenia ([place])   . Thrush 09/22/2017  . Fever   . Pancytopenia ([place])   . Atrial fibrillation with RVR ([place])   . AML (acute myeloblastic leukemia) ([place])   . Advance care planning   . Palliative care by specialist   . AML (acute myeloid leukemia) ([place]) 09/12/2017  . Goals of care, counseling/discussion 09/12/2017  . Pressure injury of skin 09/06/2017  . DVT (deep venous thrombosis), left 03/02/2015  . DVT (deep venous thrombosis), unspecified laterality 03/02/2015  .  GERD (gastroesophageal reflux disease) 03/02/2015  . Chest pain 04/20/2011  . Cervical radiculopathy 04/20/2011  . Hypertension   . Stroke ([place])   . High cholesterol     Palliative Care Assessment & Plan   Patient Profile: 83 y.o. female  with past medical history of osteoporosis, hyperlipidemia, occlusive lower left extremity DVT (on Xarelto), CVA, admitted on 09/15/2017 with worsening weakness and fatigue. She was scheduled to be seen by oncology same day of admission but missed the appointment due to weakness. Workup this admission has revealed AML and she has been started on treatment with Dacogen. Palliative medicine consulted for [place].    Assessment/Recommendations/Plan   Pt continues to eat very little, poor nutritional status- can increase Remeron to 15mg  po nightly for possible appetite stimulant  SLP eval for pocketing and eval other possible causes of patient declining nutrition and pills  Discussed Code Status with Phyllis Ginger agrees that patient would not benefit from full code, however, she needs to discuss this with her son before implementing DNR status- continue full code for now  Goals of Care and Additional Recommendations:  Limitations on Scope of Treatment: Full Scope Treatment  Code Status:  Full code  Prognosis:   Unable to determine  Discharge Planning:  To Be Determined  Care plan was discussed with patient's daughter.   Thank you for allowing the Palliative Medicine Team to assist in the care of this patient.   Time In: 1600 Time Out: 1635 Total Time 35 mins Prolonged Time Billed Yes      Greater than 50%  of this time was spent counseling and coordinating care related to the above assessment and plan.  [name], AGNP-C Palliative Medicine   Please contact Palliative Medicine Team phone at [phone] for questions  and concerns.

## 2017-09-30 NOTE — Consult Note (Signed)
[facility] Nurse wound follow up Wound type: Unstageable pressure injury Measurement: 6.8cm x 4cm with depth obscured by the presence of firmly adherent black, nonviable tissue Wound bed:As described above Drainage (amount, consistency, odor) none.  Odor consistent with nonviable (necrotic) tissue. Periwound: mild erythema, induration. Dressing procedure/placement/frequency: Wound evaluated with PT provider Weston Anna.  No progress since initial assessment despite daily pulsatile lavage and enzymatic debriding agent.  Recommend decreasing hydrotherapy to twice weekly on Tuesdays and Fridays. As previously noted, this treatment is not available in post- acute care settings other than LTAC. If more rapid debridement is desired (sharp), recommend consultation with [facility], as there does not seem to be the progress desired using enzymatic debridement (collagenase/Santyl).  [facility] nursing team will follow weekly, and will remain available to this patient, the nursing, Rehab and medical teams.   Thanks, Maudie Flakes, MSN, RN, [place], Arther Abbott  Pager# [phone]

## 2017-09-30 NOTE — Progress Notes (Signed)
Spoke with both pt;s daughters Joycelyn Schmid and Albertina Parr today to continue DC planning. They are still interested in pt admitting to [facility] for short term rehab if still appropriate based on pt's care needs at DC. Facility would not be able to provide hydrotherapy. Family would need to supply oral chemo medications as facility cannot cover this. Spoke with [facility] for update today as well.  Sharren Bridge, MSW, LCSW Clinical Social Work 09/30/2017 [phone]

## 2017-09-30 NOTE — Progress Notes (Addendum)
HYDROTHERAPY TREATMENT      09/30/17 1200  Subjective Assessment  Subjective "okay"  Patient and Family Stated Goals none stated, no family present  Prior Treatments dressing changes per nursing  Evaluation and Treatment  Evaluation and Treatment Procedures Explained to Patient/Family Yes  Evaluation and Treatment Procedures Patient unable to consent due to mental status  Pressure Injury 09/26/17 Unstageable - Full thickness tissue loss in which the base of the ulcer is covered by slough (yellow, tan, gray, green or brown) and/or eschar (tan, brown or black) in the wound bed. *PT ONLY* sacral pressure injury  Date First Assessed/Time First Assessed: 09/26/17 1130   Location: Sacrum  Location Orientation: Mid  Staging: Unstageable - Full thickness tissue loss in which the base of the ulcer is covered by slough (yellow, tan, gray, green or brown) and/or esch...  Dressing Type Moist to dry;Barrier Film (skin prep);Foam (Santyl)  Dressing Changed  Dressing Change Frequency PRN  Site / Wound Assessment Brown;Dry  % Wound base Red or Granulating 0%  % Wound base Other/Granulation Tissue (Comment) 100% (brown/black adhered tissue)  Peri-wound Assessment Induration  Margins Unattached edges (unapproximated)  Drainage Amount Minimal  Treatment Hydrotherapy (Pulse lavage);Packing (Saline gauze) (Enzymatic debridement)  Hydrotherapy  Pulsed lavage therapy - wound location sacrum  Pulsed Lavage with Suction (psi) 4 psi  Pulsed Lavage with Suction - Normal Saline Used 1000 mL  Pulsed Lavage Tip Tip with splash shield  Wound Therapy - Assess/Plan/Recommendations  Wound Therapy - Clinical Statement 83 yo female with unstageable pressure injury to sacrum. New diagnosis of AML.   Wound Therapy - Functional Problem List limited mobility  Factors Delaying/Impairing Wound Healing Incontinence;Immobility;Poor Nutrition  Hydrotherapy Plan Dressing change;Patient/family education;Pulsatile lavage with  suction;Debridement  Wound Therapy - Frequency 6X / week  Wound Therapy - Current Recommendations Case manager/social work;WOC nurse  Wound Therapy - Follow Up Recommendations Skilled nursing facility  Wound Plan Will continue hydrotherapy for now. Have been unable to debride any tissue over past few sessions. May need to reconsult WOC nurse. Nursing may be able to take over wound management since wound has not been progressing with PT hydrotherapy. Addendum: Consulted with WOCN this pm. Discussed poor progress and continued need for hydrotherapy. We will plan to decrease hydrotherapy freq to 2x/week (Tues and Fri for 1 week). Nursing will provide wound care on all other days and thereafter.  Wound Therapy Goals - Improve the function of patient's integumentary system by progressing the wound(s) through the phases of wound healing by:  Decrease Necrotic Tissue to 75  Decrease Necrotic Tissue - Progress Not progressing  Increase Granulation Tissue to 25  Increase Granulation Tissue - Progress Mot progressing  Goals/treatment plan/discharge plan were made with and agreed upon by patient/family No, Patient unable to participate in goals/treatment/discharge plan and family unavailable  Time For Goal Achievement 2 weeks  Wound Therapy - Potential for Goals Poor   Weston Anna, MPT [phone]

## 2017-09-30 NOTE — Care Management Important Message (Signed)
Important Message  Patient Details  Name: Brynlee J Martinique MRN: [id number] Date of Birth: 1934-10-06   Medicare Important Message Given:  Yes    Kerin Salen 09/30/2017, 9:51 AMImportant Message  Patient Details  Name: Miguelina J Martinique MRN: [id number] Date of Birth: June 14, 1934   Medicare Important Message Given:  Yes    Kerin Salen 09/30/2017, 9:51 AM

## 2017-09-30 NOTE — Progress Notes (Signed)
PROGRESS NOTE    Infant Leah Lewis  GYI:[id number] DOB: 08-Sep-1934 DOA: 09/18/2017 PCP: Wenda Low, MD   Brief Narrative: Leah Lewis is a 83 y.o. female with past medical history of osteoporosis, hyperlipidemia, remote DVT on Xarelto. Patient presented secondary to generalized weakness and found to be pancytopenic secondary to AML. She has received multiple PRBC and platelet transfusions. She also developed atrial fibrillation with RVR which is currently controled. She has now been started on chemotherapy.   Assessment & Plan:   Active Problems:   Pressure injury of skin   AML (acute myeloid leukemia) (HCC)   Goals of care, counseling/discussion   AML (acute myeloblastic leukemia) ([place])   Advance care planning   Palliative care by specialist   Fever   Pancytopenia ([place])   Atrial fibrillation with RVR ([place])   Thrush   Thrombocytopenia ([place])   AML Patient started on oral chemotherapy 6/24. Afebrile. Grave prognosis per medical oncology. She has been transfused a total of 7 units PRBC and 9 units platelets. Platelets down again today to [facility] -Medical oncology recommendations: continue venetoclax, transfuse for platelets <10k; Transfusing platelets today -Daily labs (BMP, CBC, LDH, FOBT, phosphorus, uric acid)  Atrial fibrillation with RVR Rate uncontrolled today. BP stable. Not on anticoagulation secondary to thrombocytopenia. Was treated with Cardizem and transitioned to metoprolol secondary to drug interaction with chemotherapy -Increase to metoprolol 50 mg BID  Right middle lobe pneumonia Started on antibiotics. Stable. Afebrile. -Continue Cefepime  Essential hypertension Stable. Well controlled. -Continue metoprolol as above  Unstageable sacral pressure ulcer Wound care recommended hydrotherapy. Unable to perform hydrotherapy secondary to recurrent significant thrombocytopenia  Malnutrition, unknown severity Per dietitian notes, patient may benefit from feeding  tube. Recurrent thrombocytopenia makes it unsafe to place a feeding tube currently. Will need to continue encouraging oral intake.  Oral thrush Treated with fluconazole  Hypokalemia -Supplementation and repeat BMP   DVT prophylaxis: SCDs Code Status:   Code Status: Full Code Family Communication: None at bedside Disposition Plan: Discharge to SNF when medically stable   Consultants:   Medical oncology  Palliative care medicine  Procedures:   6/3: 3 units PRBC  6/10: 2 units platelets  6/14: 2 units platelets  6/19: 2 units PRBC and 2 units platelets  6/22: 1 unit plalets  6/25: 2 units PRBC and 2 units platelets  6/28: 1 unit platelets  Antimicrobials:  Vancomycin  Meropenem  Fluconazole  Cefepime    Subjective: No bleeding. No chest pain or dyspnea. Feels her heart beating fast.  Objective: Vitals:   09/29/17 1455 09/29/17 2120 09/30/17 0205 09/30/17 0547  BP: 99/65 112/86  117/73  Pulse: 90 (!) 118  (!) 116  Resp: 18 20  (!) 28  Temp: 99.5 F (37.5 C) 97.8 F (36.6 C)  97.6 F (36.4 C)  TempSrc: Oral Axillary  Oral  SpO2: 97% 100%  100%  Weight:   66.7 kg (147 lb)   Height:        Intake/Output Summary (Last 24 hours) at 09/30/2017 0953 Last data filed at 09/30/2017 0658 Gross per 24 hour  Intake 600 ml  Output 2000 ml  Net -1400 ml   Filed Weights   09/27/17 0222 09/28/17 0234 09/30/17 0205  Weight: 66.2 kg (146 lb) 68.9 kg (152 lb) 66.7 kg (147 lb)    Examination:  General exam: Appears calm and comfortable Respiratory system: Clear to auscultation. Respiratory effort normal. Cardiovascular system: S1 & S2 heard, fast rate, irregular rhythm Gastrointestinal system: Abdomen  is nondistended, soft and nontender. No organomegaly or masses felt. Normal bowel sounds heard. Central nervous system: Alert and oriented to person. Extremities: No edema. No calf tenderness Skin: No cyanosis. No rashes Psychiatry: Judgement and insight  appear impaired. Flat affect. Normal mood.   Data Reviewed: I have personally reviewed following labs and imaging studies  CBC: Recent Labs  Lab 09/26/17 0336 09/27/17 0505 09/28/17 0630 09/29/17 0650 09/30/17 0540  WBC 7.1 14.0* 7.5 6.8 4.8  NEUTROABS 2.1 3.6 2.4 3.7 2.3  HGB 7.9* 6.8* 10.4* 10.6* 9.7*  HCT 25.3* 21.9* 31.1* 31.9* 29.6*  MCV 96.2 96.5 91.5 91.4 91.6  PLT 12* 6* 44* 12* 7*   Basic Metabolic Panel: Recent Labs  Lab 09/28/17 0630 09/28/17 1740 09/29/17 0650 09/29/17 1930 09/30/17 0540  NA 143 142 145 143 147*  K 2.8* 3.7 3.0* 2.8* 2.5*  CL 100 102 104 102 105  CO2 32 30 31 29 31   GLUCOSE 218* 232* 192* 372* 189*  BUN 30* 27* 26* 28* 24*  CREATININE 0.78 0.78 0.78 0.84 0.77  CALCIUM 7.6* 7.4* 7.7* 7.2* 7.6*  PHOS 3.4 3.3 3.2 2.8 2.9   GFR: Estimated Creatinine Clearance: 51.8 mL/min (by C-G formula based on SCr of 0.77 mg/dL). Liver Function Tests: Recent Labs  Lab 09/26/17 0336 09/27/17 0505 09/28/17 0630 09/29/17 0650 09/30/17 0540  AST 34 25 33 29 28  ALT 40 31 32 33 29  ALKPHOS 61 66 72 81 76  BILITOT 2.2* 1.7* 2.0* 2.9* 2.5*  PROT 5.4* 5.1* 5.8* 6.0* 5.5*  ALBUMIN 1.7* 1.5* 1.7* 1.7* 1.5*   No results for input(s): LIPASE, AMYLASE in the last 168 hours. No results for input(s): AMMONIA in the last 168 hours. Coagulation Profile: No results for input(s): INR, PROTIME in the last 168 hours. Cardiac Enzymes: No results for input(s): CKTOTAL, CKMB, CKMBINDEX, TROPONINI in the last 168 hours. BNP (last 3 results) No results for input(s): PROBNP in the last 8760 hours. HbA1C: No results for input(s): HGBA1C in the last 72 hours. CBG: Recent Labs  Lab 09/29/17 0615 09/29/17 1201 09/29/17 1853 09/29/17 2355 09/30/17 0608  GLUCAP 184* 173* 360* 226* 153*   Lipid Profile: No results for input(s): CHOL, HDL, LDLCALC, TRIG, CHOLHDL, LDLDIRECT in the last 72 hours. Thyroid Function Tests: No results for input(s): TSH, T4TOTAL,  FREET4, T3FREE, THYROIDAB in the last 72 hours. Anemia Panel: No results for input(s): VITAMINB12, FOLATE, FERRITIN, TIBC, IRON, RETICCTPCT in the last 72 hours. Sepsis Labs: No results for input(s): PROCALCITON, LATICACIDVEN in the last 168 hours.  Recent Results (from the past 240 hour(s))  Culture, blood (Routine X 2) w Reflex to ID Panel     Status: None (Preliminary result)   Collection Time: 09/25/17  7:28 PM  Result Value Ref Range Status   Specimen Description   Final    BLOOD LEFT ANTECUBITAL Performed at [facility] [address] [place], [place] [address]    Special Requests   Final    BOTTLES DRAWN AEROBIC AND ANAEROBIC Blood Culture adequate volume Performed at [facility] [address], [place], [place] [address]    Culture   Final    NO GROWTH 4 DAYS Performed at [facility], [place] [address], [place], [place] [address]    Report Status PENDING  Incomplete  Culture, blood (Routine X 2) w Reflex to ID Panel     Status: None (Preliminary result)   Collection Time: 09/25/17  7:28 PM  Result Value Ref Range Status   Specimen Description   Final    BLOOD LEFT HAND Performed at [facility], [place] [address], [place], [place] [address]    Special Requests   Final    BOTTLES DRAWN AEROBIC AND ANAEROBIC Blood Culture results may not be optimal due to an inadequate volume of blood received in culture bottles Performed at [facility] [address], [place], [place] [address]    Culture   Final    NO GROWTH 4 DAYS Performed at [facility], [place] [address] [place], [place] [address]    Report Status PENDING  Incomplete         Radiology Studies: No results found.      Scheduled Meds: . sodium chloride   Intravenous Once  . collagenase   Topical Daily  . feeding supplement (ENSURE ENLIVE)  237 mL Oral TID BM  . insulin aspart  0-9 Units Subcutaneous Q6H  . ketorolac  1 drop  Both Eyes QID  . lidocaine  1 patch Transdermal QAC breakfast  . mouth rinse  15 mL Mouth Rinse BID  . metoprolol tartrate  50 mg Oral BID  . mirtazapine  7.5 mg Oral QHS  . multivitamin with minerals  1 tablet Oral Daily  . nystatin  5 mL Oral Q8H  . polyethylene glycol  17 g Oral BID  . prednisoLONE acetate  1 drop Both Eyes QID  . sodium chloride flush  10-40 mL Intracatheter Q12H  . venetoclax  200 mg Oral Daily   Followed by  . venetoclax  400 mg Oral Daily   Continuous Infusions: . sodium chloride 50 mL/hr at 09/30/17 0558  . sodium chloride 75 mL/hr at 09/30/17 0844  . ceFEPime (MAXIPIME) IV 2 g (09/30/17 0949)  . potassium chloride 10 mEq (09/30/17 0844)     LOS: 24 days     Cordelia Poche, MD [facility] 09/30/2017, 9:53 AM Pager: [phone]  If 7PM-7AM, please contact night-coverage [email or web address] 09/30/2017, 9:53 AM

## 2017-09-30 NOTE — Progress Notes (Addendum)
Non face to face encounter-   Received call from patient's daughter- Joycelyn Schmid.   She states that she knows her mother's time is limited and she wants her Mom to spend her days receiving the best care possible, even if that means she has to do it herself.  Joycelyn Schmid stated that she sees her mom declining every day. She states that she can tell her Mom is at end of life.  She is interested in taking her mom home with her and caring for her rather than having her go to a nursing facility.  We discussed the personal care needs this would involve and Joycelyn Schmid is willing to care for her Mom in this way. We also discussed the option of going to SNF then going home, or going home with Hospice after SNF depending on how she does at [facility]. While discussing GOC- Joycelyn Schmid indicated that while she wants to take her Mom home- her [place] has not changed to comfort. She still wishes to continue aggressive medical care and continue her mother's chemotherapy.  Hospice support at home is not an option at this point as Joycelyn Schmid desires to attempt to continue to administer chemotherapy. She will continue to do this until Dr. Marin Olp tells her that it is no longer in the patient's best interest.  She is interested in hearing from Dr. Marin Olp his thoughts on her prognosis of time of living life while she continues to take the chemotherapy, taking into account her prolonged hospitalization, poor nutritional status, and changes in cognition.  I gave Joycelyn Schmid emotional support.  Will put in consult for case management to contact for home health options.   [name], AGNP-C Palliative Medicine  Please call Palliative Medicine team phone with any questions [phone]. For individual providers please see AMION.  Start time: 0900 End time: 0945 Prolonged services billed: Yes Total time: 45 mins

## 2017-09-30 NOTE — Progress Notes (Signed)
Overall, I think that Leah Lewis is doing fairly well.  I believe that the Venetoclax is helping.  Her hemoglobin is stabilizing.  Her platelet count is dropping.  Platelet count was 7,000.  I will give her a unit of platelets.  I just wish that she would eat more.  I just do not think that she is eating all that much.  I do not think there is any pain.  I do not think she has any shortness of breath.  There is no bleeding.  Her atrial fibrillation is is quite rapid this morning.  Her potassium is only 2.5.  She needs to have a potassium replacement.  Her white cell count is 4.8.  Hemoglobin 9.7.  Her LDH is 479.  Her uric acid is 3.6.  Again, I just do not think that tumor lysis is going to be a problem.  There is no fever.  She does have the infiltrates on her chest x-ray yesterday.  She is on antibiotics.  I will continue the antibiotics.  I would repeat a chest x-ray tomorrow.  On her physical exam, her temperature is 97.6.  Pulse 116.  Blood pressure 117/73.  Her lungs sound clear bilaterally.  Oral exam shows no thrush.  Cardiac exam is tachycardic and irregular.  Abdomen is soft.  Bowel sounds are present.  Extremities shows no edema.  She still has some weakness in her right arm.  Skin exam shows the decubiti on her back.  Hopefully, these are healing in.  Leah Lewis has AML.  We have her on decitabine/venetoclax.  I think that she is responding.  Again, nutrition is getting be critical for her.  Her atrial fibrillation really needs to get under better control.  Her potassium of 2.5 is not helping.  I will give her potassium supplementation.  I suspect she probably will need some tomorrow also.  I will be out next week.  I will have 1 of my partners stop by and see her and help out.  [name], MD  Philippians 4:13

## 2017-10-01 DIAGNOSIS — Z7189 Other specified counseling: Secondary | ICD-10-CM

## 2017-10-01 LAB — CBC WITH DIFFERENTIAL/PLATELET
BASOS ABS: 0 10*3/uL (ref 0.0–0.1)
BLASTS: 9 %
Band Neutrophils: 9 %
Basophils Relative: 0 %
Eosinophils Absolute: 0 10*3/uL (ref 0.0–0.7)
Eosinophils Relative: 0 %
HEMATOCRIT: 27.8 % — AB (ref 36.0–46.0)
HEMOGLOBIN: 9.1 g/dL — AB (ref 12.0–15.0)
Lymphocytes Relative: 27 %
Lymphs Abs: 1.5 10*3/uL (ref 0.7–4.0)
MCH: 30.5 pg (ref 26.0–34.0)
MCHC: 32.7 g/dL (ref 30.0–36.0)
MCV: 93.3 fL (ref 78.0–100.0)
MYELOCYTES: 7 %
Metamyelocytes Relative: 5 %
Monocytes Absolute: 1.2 10*3/uL — ABNORMAL HIGH (ref 0.1–1.0)
Monocytes Relative: 22 %
NEUTROS ABS: 2.3 10*3/uL (ref 1.7–7.7)
NEUTROS PCT: 21 %
NRBC: 2 /100{WBCs} — AB
Platelets: 18 10*3/uL — CL (ref 150–400)
RBC: 2.98 MIL/uL — AB (ref 3.87–5.11)
RDW: 17 % — ABNORMAL HIGH (ref 11.5–15.5)
WBC: 5.4 10*3/uL (ref 4.0–10.5)

## 2017-10-01 LAB — COMPREHENSIVE METABOLIC PANEL
ALT: 29 U/L (ref 0–44)
ANION GAP: 9 (ref 5–15)
AST: 31 U/L (ref 15–41)
Albumin: 1.7 g/dL — ABNORMAL LOW (ref 3.5–5.0)
Alkaline Phosphatase: 69 U/L (ref 38–126)
BILIRUBIN TOTAL: 2.7 mg/dL — AB (ref 0.3–1.2)
BUN: 26 mg/dL — ABNORMAL HIGH (ref 8–23)
CHLORIDE: 108 mmol/L (ref 98–111)
CO2: 31 mmol/L (ref 22–32)
Calcium: 7.3 mg/dL — ABNORMAL LOW (ref 8.9–10.3)
Creatinine, Ser: 0.74 mg/dL (ref 0.44–1.00)
GFR calc Af Amer: >60 mL/min (ref >60)
Glucose, Bld: 152 mg/dL — ABNORMAL HIGH (ref 70–99)
POTASSIUM: 2.9 mmol/L — AB (ref 3.5–5.1)
Sodium: 148 mmol/L — ABNORMAL HIGH (ref 135–145)
TOTAL PROTEIN: 5.7 g/dL — AB (ref 6.5–8.1)

## 2017-10-01 LAB — BASIC METABOLIC PANEL
Anion gap: 9 (ref 5–15)
BUN: 23 mg/dL (ref 8–23)
CHLORIDE: 105 mmol/L (ref 98–111)
CO2: 31 mmol/L (ref 22–32)
CREATININE: 0.73 mg/dL (ref 0.44–1.00)
Calcium: 7.3 mg/dL — ABNORMAL LOW (ref 8.9–10.3)
GFR calc non Af Amer: >60 mL/min (ref >60)
Glucose, Bld: 157 mg/dL — ABNORMAL HIGH (ref 70–99)
POTASSIUM: 3 mmol/L — AB (ref 3.5–5.1)
Sodium: 145 mmol/L (ref 135–145)

## 2017-10-01 LAB — URIC ACID
URIC ACID, SERUM: 3.7 mg/dL (ref 2.5–7.1)
URIC ACID, SERUM: 3.2 mg/dL (ref 2.5–7.1)

## 2017-10-01 LAB — CULTURE, BLOOD (ROUTINE X 2)
Culture: NO GROWTH
Culture: NO GROWTH
SPECIAL REQUESTS: ADEQUATE

## 2017-10-01 LAB — GLUCOSE, CAPILLARY
GLUCOSE-CAPILLARY: 337 mg/dL — AB (ref 70–99)
GLUCOSE-CAPILLARY: 153 mg/dL — AB (ref 70–99)
GLUCOSE-CAPILLARY: 266 mg/dL — AB (ref 70–99)

## 2017-10-01 LAB — BPAM PLATELET PHERESIS
BLOOD PRODUCT EXPIRATION DATE: 201906302359
ISSUE DATE / TIME: 201906281216
UNIT TYPE AND RH: 6200

## 2017-10-01 LAB — PREPARE PLATELET PHERESIS: Unit division: 0

## 2017-10-01 LAB — PHOSPHORUS
PHOSPHORUS: 2.7 mg/dL (ref 2.5–4.6)
Phosphorus: 2.3 mg/dL — ABNORMAL LOW (ref 2.5–4.6)

## 2017-10-01 LAB — LACTATE DEHYDROGENASE: LDH: 502 U/L — AB (ref 98–192)

## 2017-10-01 LAB — MAGNESIUM: Magnesium: 1.4 mg/dL — ABNORMAL LOW (ref 1.7–2.4)

## 2017-10-01 MED ORDER — SODIUM CHLORIDE 0.45 % IV SOLN
INTRAVENOUS | Status: DC
  Administered 2017-10-01 – 2017-10-02 (×2): via INTRAVENOUS

## 2017-10-01 MED ORDER — DIGOXIN 0.25 MG/ML IJ SOLN
0.2500 mg | Freq: Once | INTRAMUSCULAR | Status: DC
  Filled 2017-10-01 (×2): qty 1

## 2017-10-01 MED ORDER — POTASSIUM CHLORIDE CRYS ER 20 MEQ PO TBCR
30.0000 meq | EXTENDED_RELEASE_TABLET | Freq: Two times a day (BID) | ORAL | Status: AC
  Administered 2017-10-01 (×2): 30 meq via ORAL
  Filled 2017-10-01 (×2): qty 1

## 2017-10-01 MED ORDER — METOPROLOL TARTRATE 5 MG/5ML IV SOLN
5.0000 mg | Freq: Once | INTRAVENOUS | Status: AC
  Administered 2017-10-01: 5 mg via INTRAVENOUS
  Filled 2017-10-01: qty 5

## 2017-10-01 NOTE — Progress Notes (Signed)
Patients oxygen level 86% on room air. Oxygen applied via nasal cannula @ 2 liters oxygen level 92%.

## 2017-10-01 NOTE — Progress Notes (Signed)
Patient transferred to telemetry to Room 1418 in the care of Barnetta Chapel, RN.  Patient stable from AM assessment.  Joycelyn Schmid, daughter notified of transfer.

## 2017-10-01 NOTE — Progress Notes (Signed)
PROGRESS NOTE    Leah Lewis  XLK:[id number] DOB: 1934-11-18 DOA: 09/17/2017 PCP: Wenda Low, MD   Brief Narrative: Leah Lewis is a 83 y.o. female with past medical history of osteoporosis, hyperlipidemia, remote DVT on Xarelto. Patient presented secondary to generalized weakness and found to be pancytopenic secondary to AML. She has received multiple PRBC and platelet transfusions. She also developed atrial fibrillation with RVR which is currently controled. She has now been started on chemotherapy.   Assessment & Plan:   Active Problems:   Pressure injury of skin   AML (acute myeloid leukemia) (HCC)   Goals of care, counseling/discussion   AML (acute myeloblastic leukemia) ([place])   Advance care planning   Palliative care by specialist   Fever   Pancytopenia ([place])   Atrial fibrillation with RVR ([place])   Thrush   Thrombocytopenia ([place])   Advanced care planning/counseling discussion   AML Patient started on oral chemotherapy 6/24. Afebrile. Grave prognosis per medical oncology. She has been transfused a total of 7 units PRBC and 10 units platelets. Platelets improved after transfusion -Medical oncology recommendations: continue venetoclax, transfuse for platelets <10k -Daily labs (BMP, CBC, LDH, FOBT, phosphorus, uric acid)  Atrial fibrillation with RVR Rate uncontrolled today. BP stable. Not on anticoagulation secondary to thrombocytopenia. Was treated with Cardizem and transitioned to metoprolol secondary to drug interaction with chemotherapy -Continue metoprolol 50 mg BID -Metoprolol 5 mg IV x1   Right middle lobe pneumonia Started on antibiotics. Stable. Afebrile. -Continue Cefepime  Essential hypertension Stable. Well controlled. -Continue metoprolol as above  Unstageable sacral pressure ulcer Wound care recommended hydrotherapy. Unable to perform hydrotherapy secondary to recurrent significant thrombocytopenia  Malnutrition, unknown severity Per  dietitian notes, patient may benefit from feeding tube. Recurrent thrombocytopenia makes it unsafe to place a feeding tube currently.  Oral intake has improved. -Continue oral nutrition  Oral thrush Treated with fluconazole  Hypokalemia -Supplementation and repeat BMP   DVT prophylaxis: SCDs Code Status:   Code Status: Full Code Family Communication: None at bedside Disposition Plan: Discharge to SNF when medically stable   Consultants:   Medical oncology  Palliative care medicine  Procedures:   6/3: 3 units PRBC  6/10: 2 units platelets  6/14: 2 units platelets  6/19: 2 units PRBC and 2 units platelets  6/22: 1 unit plalets  6/25: 2 units PRBC and 2 units platelets  6/28: 1 unit platelets  Antimicrobials:  Vancomycin  Meropenem  Fluconazole  Cefepime    Subjective: No chest pain or dyspnea.  Objective: Vitals:   09/30/17 2125 10/01/17 0534 10/01/17 1118 10/01/17 1121  BP: 98/73 97/66 (!) 102/58 102/74  Pulse: (!) 102 (!) 144 87 (!) 125  Resp: 16 13  (!) 22  Temp: 98.5 F (36.9 C) 98.8 F (37.1 C)  99 F (37.2 C)  TempSrc: Oral Oral  Axillary  SpO2: 95% 100%  93%  Weight:    62.1 kg (137 lb)  Height:        Intake/Output Summary (Last 24 hours) at 10/01/2017 1216 Last data filed at 10/01/2017 0600 Gross per 24 hour  Intake 1570 ml  Output 2100 ml  Net -530 ml   Filed Weights   09/28/17 0234 09/30/17 0205 10/01/17 1121  Weight: 68.9 kg (152 lb) 66.7 kg (147 lb) 62.1 kg (137 lb)    Examination:  General exam: Appears calm and comfortable Respiratory system: Clear to auscultation. Respiratory effort normal. Cardiovascular system: S1 & S2 heard, fast rate, irregular rhythm. No  murmurs, rubs, gallops or clicks. Gastrointestinal system: Abdomen is nondistended, soft and nontender. No organomegaly or masses felt. Normal bowel sounds heard. Central nervous system: Alert and oriented to person. Extremities: No edema. No calf  tenderness Skin: No cyanosis. No rashes Psychiatry: Judgement and insight appear impaired    Data Reviewed: I have personally reviewed following labs and imaging studies  CBC: Recent Labs  Lab 09/27/17 0505 09/28/17 0630 09/29/17 0650 09/30/17 0540 10/01/17 0428  WBC 14.0* 7.5 6.8 4.8 5.4  NEUTROABS 3.6 2.4 3.7 2.3 2.3  HGB 6.8* 10.4* 10.6* 9.7* 9.1*  HCT 21.9* 31.1* 31.9* 29.6* 27.8*  MCV 96.5 91.5 91.4 91.6 93.3  PLT 6* 44* 12* 7* 18*   Basic Metabolic Panel: Recent Labs  Lab 09/29/17 0650 09/29/17 1930 09/30/17 0540 09/30/17 1800 10/01/17 0428  NA 145 143 147* 148* 148*  K 3.0* 2.8* 2.5* 3.6 2.9*  CL 104 102 105 107 108  CO2 31 29 31  32 31  GLUCOSE 192* 372* 189* 283* 152*  BUN 26* 28* 24* 29* 26*  CREATININE 0.78 0.84 0.77 0.79 0.74  CALCIUM 7.7* 7.2* 7.6* 7.3* 7.3*  MG  --   --   --   --  1.4*  PHOS 3.2 2.8 2.9 2.7 2.7   GFR: Estimated Creatinine Clearance: 47.6 mL/min (by C-G formula based on SCr of 0.74 mg/dL). Liver Function Tests: Recent Labs  Lab 09/27/17 0505 09/28/17 0630 09/29/17 0650 09/30/17 0540 10/01/17 0428  AST 25 33 29 28 31   ALT 31 32 33 29 29  ALKPHOS 66 72 81 76 69  BILITOT 1.7* 2.0* 2.9* 2.5* 2.7*  PROT 5.1* 5.8* 6.0* 5.5* 5.7*  ALBUMIN 1.5* 1.7* 1.7* 1.5* 1.7*   No results for input(s): LIPASE, AMYLASE in the last 168 hours. No results for input(s): AMMONIA in the last 168 hours. Coagulation Profile: No results for input(s): INR, PROTIME in the last 168 hours. Cardiac Enzymes: No results for input(s): CKTOTAL, CKMB, CKMBINDEX, TROPONINI in the last 168 hours. BNP (last 3 results) No results for input(s): PROBNP in the last 8760 hours. HbA1C: No results for input(s): HGBA1C in the last 72 hours. CBG: Recent Labs  Lab 09/29/17 2355 09/30/17 0608 09/30/17 1214 09/30/17 1811 09/30/17 2358  GLUCAP 226* 153* 238* 177* 188*   Lipid Profile: No results for input(s): CHOL, HDL, LDLCALC, TRIG, CHOLHDL, LDLDIRECT in the last  72 hours. Thyroid Function Tests: No results for input(s): TSH, T4TOTAL, FREET4, T3FREE, THYROIDAB in the last 72 hours. Anemia Panel: No results for input(s): VITAMINB12, FOLATE, FERRITIN, TIBC, IRON, RETICCTPCT in the last 72 hours. Sepsis Labs: No results for input(s): PROCALCITON, LATICACIDVEN in the last 168 hours.  Recent Results (from the past 240 hour(s))  Culture, blood (Routine X 2) w Reflex to ID Panel     Status: None   Collection Time: 09/25/17  7:28 PM  Result Value Ref Range Status   Specimen Description   Final    BLOOD LEFT ANTECUBITAL Performed at [facility] [address] [place], [place] [address]    Special Requests   Final    BOTTLES DRAWN AEROBIC AND ANAEROBIC Blood Culture adequate volume Performed at [facility] [address], [place], [place] [address]    Culture   Final    NO GROWTH 5 DAYS Performed at [facility], [place] [address], [place], [place] [address]    Report Status 10/01/2017 FINAL  Final  Culture, blood (Routine X 2) w Reflex  to ID Panel     Status: None   Collection Time: 09/25/17  7:28 PM  Result Value Ref Range Status   Specimen Description   Final    BLOOD LEFT HAND Performed at [facility], [place] [address] [place], [place] [address]    Special Requests   Final    BOTTLES DRAWN AEROBIC AND ANAEROBIC Blood Culture results may not be optimal due to an inadequate volume of blood received in culture bottles Performed at [facility] [address], [place], [place] [address]    Culture   Final    NO GROWTH 5 DAYS Performed at [facility], [place] [address], [place], [place] [address]    Report Status 10/01/2017 FINAL  Final         Radiology Studies: No results found.      Scheduled Meds: . collagenase   Topical Daily  . digoxin  0.25 mg Intravenous Once  . feeding supplement (ENSURE ENLIVE)  237 mL Oral TID BM  . insulin aspart  0-9 Units  Subcutaneous Q6H  . ketorolac  1 drop Both Eyes QID  . lidocaine  1 patch Transdermal QAC breakfast  . mouth rinse  15 mL Mouth Rinse BID  . metoprolol tartrate  5 mg Intravenous Once  . metoprolol tartrate  50 mg Oral BID  . mirtazapine  15 mg Oral QHS  . multivitamin with minerals  1 tablet Oral Daily  . nystatin  5 mL Oral Q8H  . polyethylene glycol  17 g Oral BID  . potassium chloride  30 mEq Oral BID  . prednisoLONE acetate  1 drop Both Eyes QID  . sodium chloride flush  10-40 mL Intracatheter Q12H  . venetoclax  400 mg Oral Daily   Continuous Infusions: . sodium chloride 75 mL/hr at 10/01/17 1041  . ceFEPime (MAXIPIME) IV 2 g (10/01/17 1041)     LOS: 25 days     Cordelia Poche, MD [facility] 10/01/2017, 12:16 PM Pager: [phone]  If 7PM-7AM, please contact night-coverage [email or web address] 10/01/2017, 12:16 PM

## 2017-10-02 LAB — BASIC METABOLIC PANEL
Anion gap: 9 (ref 5–15)
BUN: 29 mg/dL — AB (ref 8–23)
CO2: 31 mmol/L (ref 22–32)
Calcium: 7.1 mg/dL — ABNORMAL LOW (ref 8.9–10.3)
Chloride: 107 mmol/L (ref 98–111)
Creatinine, Ser: 0.78 mg/dL (ref 0.44–1.00)
GFR calc Af Amer: >60 mL/min (ref >60)
GFR calc non Af Amer: >60 mL/min (ref >60)
GLUCOSE: 242 mg/dL — AB (ref 70–99)
POTASSIUM: 2.9 mmol/L — AB (ref 3.5–5.1)
Sodium: 147 mmol/L — ABNORMAL HIGH (ref 135–145)

## 2017-10-02 LAB — PHOSPHORUS
Phosphorus: 2.7 mg/dL (ref 2.5–4.6)
Phosphorus: 3.5 mg/dL (ref 2.5–4.6)

## 2017-10-02 LAB — COMPREHENSIVE METABOLIC PANEL WITH GFR
ALT: 38 U/L (ref 0–44)
AST: 43 U/L — ABNORMAL HIGH (ref 15–41)
Albumin: 1.7 g/dL — ABNORMAL LOW (ref 3.5–5.0)
Alkaline Phosphatase: 83 U/L (ref 38–126)
Anion gap: 12 (ref 5–15)
BUN: 27 mg/dL — ABNORMAL HIGH (ref 8–23)
CO2: 31 mmol/L (ref 22–32)
Calcium: 7.7 mg/dL — ABNORMAL LOW (ref 8.9–10.3)
Chloride: 105 mmol/L (ref 98–111)
Creatinine, Ser: 0.81 mg/dL (ref 0.44–1.00)
GFR calc Af Amer: >60 mL/min
GFR calc non Af Amer: >60 mL/min
Glucose, Bld: 244 mg/dL — ABNORMAL HIGH (ref 70–99)
Potassium: 3.3 mmol/L — ABNORMAL LOW (ref 3.5–5.1)
Sodium: 148 mmol/L — ABNORMAL HIGH (ref 135–145)
Total Bilirubin: 3.2 mg/dL — ABNORMAL HIGH (ref 0.3–1.2)
Total Protein: 6.2 g/dL — ABNORMAL LOW (ref 6.5–8.1)

## 2017-10-02 LAB — CBC WITH DIFFERENTIAL/PLATELET
Basophils Absolute: 0 K/uL (ref 0.0–0.1)
Basophils Relative: 0 %
Blasts: 2 %
Eosinophils Absolute: 0 K/uL (ref 0.0–0.7)
Eosinophils Relative: 0 %
HCT: 31.8 % — ABNORMAL LOW (ref 36.0–46.0)
Hemoglobin: 10.2 g/dL — ABNORMAL LOW (ref 12.0–15.0)
Lymphocytes Relative: 18 %
Lymphs Abs: 1 K/uL (ref 0.7–4.0)
MCH: 30 pg (ref 26.0–34.0)
MCHC: 32.1 g/dL (ref 30.0–36.0)
MCV: 93.5 fL (ref 78.0–100.0)
Monocytes Absolute: 2 K/uL — ABNORMAL HIGH (ref 0.1–1.0)
Monocytes Relative: 34 %
Myelocytes: 5 %
Neutro Abs: 2.7 K/uL (ref 1.7–7.7)
Neutrophils Relative %: 41 %
Platelets: 6 K/uL — CL (ref 150–400)
RBC: 3.4 MIL/uL — ABNORMAL LOW (ref 3.87–5.11)
RDW: 16.9 % — ABNORMAL HIGH (ref 11.5–15.5)
WBC: 5.8 K/uL (ref 4.0–10.5)

## 2017-10-02 LAB — GLUCOSE, CAPILLARY
GLUCOSE-CAPILLARY: 166 mg/dL — AB (ref 70–99)
GLUCOSE-CAPILLARY: 259 mg/dL — AB (ref 70–99)
Glucose-Capillary: 248 mg/dL — ABNORMAL HIGH (ref 70–99)
Glucose-Capillary: 227 mg/dL — ABNORMAL HIGH (ref 70–99)

## 2017-10-02 LAB — LACTATE DEHYDROGENASE: LDH: 570 U/L — ABNORMAL HIGH (ref 98–192)

## 2017-10-02 LAB — URIC ACID
Uric Acid, Serum: 3.2 mg/dL (ref 2.5–7.1)
Uric Acid, Serum: 3.3 mg/dL (ref 2.5–7.1)

## 2017-10-02 MED ORDER — ALTEPLASE 2 MG IJ SOLR
2.0000 mg | Freq: Once | INTRAMUSCULAR | Status: AC
  Administered 2017-10-02: 2 mg
  Filled 2017-10-02: qty 2

## 2017-10-02 MED ORDER — METOPROLOL TARTRATE 5 MG/5ML IV SOLN
5.0000 mg | Freq: Once | INTRAVENOUS | Status: AC
  Administered 2017-10-02: 5 mg via INTRAVENOUS
  Filled 2017-10-02: qty 5

## 2017-10-02 MED ORDER — METOPROLOL TARTRATE 25 MG PO TABS
75.0000 mg | ORAL_TABLET | Freq: Two times a day (BID) | ORAL | Status: DC
  Administered 2017-10-02 – 2017-10-03 (×3): 75 mg via ORAL
  Filled 2017-10-02 (×4): qty 1

## 2017-10-02 MED ORDER — SODIUM CHLORIDE 0.9% IV SOLUTION
Freq: Once | INTRAVENOUS | Status: AC
  Administered 2017-10-02: 16:00:00 via INTRAVENOUS

## 2017-10-02 MED ORDER — POTASSIUM CHLORIDE IN NACL 20-0.45 MEQ/L-% IV SOLN
INTRAVENOUS | Status: DC
  Administered 2017-10-02 – 2017-10-05 (×5): via INTRAVENOUS
  Filled 2017-10-02 (×7): qty 1000

## 2017-10-02 NOTE — Progress Notes (Signed)
Patient no able  to swallow chemo drug Venetoclax today.  Per pharmacy this medication can not be crushed so medication not given.

## 2017-10-02 NOTE — Progress Notes (Signed)
PROGRESS NOTE    Leah Lewis  HAL:[id number] DOB: January 16, 1935 DOA: 09/08/2017 PCP: Wenda Low, MD   Brief Narrative: Leah Lewis is a 83 y.o. female with past medical history of osteoporosis, hyperlipidemia, remote DVT on Xarelto. Patient presented secondary to generalized weakness and found to be pancytopenic secondary to AML. She has received multiple PRBC and platelet transfusions. She also developed atrial fibrillation with RVR which is currently controled. She has now been started on chemotherapy.   Assessment & Plan:   Active Problems:   Pressure injury of skin   AML (acute myeloid leukemia) (HCC)   Goals of care, counseling/discussion   AML (acute myeloblastic leukemia) ([place])   Advance care planning   Palliative care by specialist   Fever   Pancytopenia ([place])   Atrial fibrillation with RVR ([place])   Thrush   Thrombocytopenia ([place])   Advanced care planning/counseling discussion   AML Patient started on oral chemotherapy 6/24. Afebrile. Grave prognosis per medical oncology. She has been transfused a total of 7 units PRBC and 10 units platelets. Platelets improved after transfusion but back down -Medical oncology recommendations: continue venetoclax, transfuse for platelets <10k -Daily labs (BMP, CBC, LDH, FOBT, phosphorus, uric acid) -Platelets low again; transfuse 1 unit  Atrial fibrillation with RVR Rate uncontrolled today. BP stable. Not on anticoagulation secondary to thrombocytopenia. Was treated with Cardizem and transitioned to metoprolol secondary to drug interaction with chemotherapy -Increase to metoprolol 75 mg BID; watch blood pressure -Another dose of Metoprolol 5 mg IV x1   Right middle lobe pneumonia Started on antibiotics. Stable. Afebrile. -Continue Cefepime. Plan for 7 day treatment. Last day 7/1  Essential hypertension Stable. Well controlled. -Continue metoprolol as above  Unstageable sacral pressure ulcer Wound care recommended  hydrotherapy. Unable to perform hydrotherapy secondary to recurrent significant thrombocytopenia  Malnutrition, unknown severity Per dietitian notes, patient may benefit from feeding tube. Recurrent thrombocytopenia makes it unsafe to place a feeding tube currently.  Oral intake has improved. -Continue oral nutrition  Oral thrush Treated with fluconazole  Hypokalemia -Supplementation and repeat BMP   DVT prophylaxis: SCDs Code Status:   Code Status: Full Code Family Communication: None at bedside Disposition Plan: Discharge to SNF when medically stable   Consultants:   Medical oncology  Palliative care medicine  Procedures:   6/3: 3 units PRBC  6/10: 2 units platelets  6/14: 2 units platelets  6/19: 2 units PRBC and 2 units platelets  6/22: 1 unit plalets  6/25: 2 units PRBC and 2 units platelets  6/28: 1 unit platelets  6/30: 1 unit platelets  Antimicrobials:  Vancomycin  Meropenem  Fluconazole  Cefepime    Subjective: No chest pain or dyspnea.  Objective: Vitals:   10/02/17 0357 10/02/17 0400 10/02/17 0912 10/02/17 1209  BP: 92/61  99/66 104/75  Pulse: 83  100 (!) 141  Resp: 16  20   Temp: 99.3 F (37.4 C)     TempSrc: Oral     SpO2: 93%  95% 100%  Weight:  63 kg (139 lb)    Height:        Intake/Output Summary (Last 24 hours) at 10/02/2017 1240 Last data filed at 10/02/2017 0600 Gross per 24 hour  Intake 2582.08 ml  Output 1200 ml  Net 1382.08 ml   Filed Weights   09/30/17 0205 10/01/17 1121 10/02/17 0400  Weight: 66.7 kg (147 lb) 62.1 kg (137 lb) 63 kg (139 lb)    Examination:  General exam: Appears calm and comfortable  Respiratory system: Clear to auscultation. Respiratory effort normal. Cardiovascular system: S1 & S2 heard, Irregular rhythm with fast rate. No murmurs, rubs, gallops or clicks. Gastrointestinal system: Abdomen is nondistended, soft and nontender. Normal bowel sounds heard. Central nervous system: Alert and  oriented to person. Extremities: No edema. No calf tenderness Skin: No cyanosis. No rashes Psychiatry: Judgement and insight appear impaired. Mood & affect appropriate.    Data Reviewed: I have personally reviewed following labs and imaging studies  CBC: Recent Labs  Lab 09/28/17 0630 09/29/17 0650 09/30/17 0540 10/01/17 0428 10/02/17 0550  WBC 7.5 6.8 4.8 5.4 5.8  NEUTROABS 2.4 3.7 2.3 2.3 2.7  HGB 10.4* 10.6* 9.7* 9.1* 10.2*  HCT 31.1* 31.9* 29.6* 27.8* 31.8*  MCV 91.5 91.4 91.6 93.3 93.5  PLT 44* 12* 7* 18* 6*   Basic Metabolic Panel: Recent Labs  Lab 09/30/17 0540 09/30/17 1800 10/01/17 0428 10/01/17 1931 10/02/17 0550  NA 147* 148* 148* 145 148*  K 2.5* 3.6 2.9* 3.0* 3.3*  CL 105 107 108 105 105  CO2 31 32 31 31 31   GLUCOSE 189* 283* 152* 157* 244*  BUN 24* 29* 26* 23 27*  CREATININE 0.77 0.79 0.74 0.73 0.81  CALCIUM 7.6* 7.3* 7.3* 7.3* 7.7*  MG  --   --  1.4*  --   --   PHOS 2.9 2.7 2.7 2.3* 2.7   GFR: Estimated Creatinine Clearance: 47 mL/min (by C-G formula based on SCr of 0.81 mg/dL). Liver Function Tests: Recent Labs  Lab 09/28/17 0630 09/29/17 0650 09/30/17 0540 10/01/17 0428 10/02/17 0550  AST 33 29 28 31  43*  ALT 32 33 29 29 38  ALKPHOS 72 81 76 69 83  BILITOT 2.0* 2.9* 2.5* 2.7* 3.2*  PROT 5.8* 6.0* 5.5* 5.7* 6.2*  ALBUMIN 1.7* 1.7* 1.5* 1.7* 1.7*   No results for input(s): LIPASE, AMYLASE in the last 168 hours. No results for input(s): AMMONIA in the last 168 hours. Coagulation Profile: No results for input(s): INR, PROTIME in the last 168 hours. Cardiac Enzymes: No results for input(s): CKTOTAL, CKMB, CKMBINDEX, TROPONINI in the last 168 hours. BNP (last 3 results) No results for input(s): PROBNP in the last 8760 hours. HbA1C: No results for input(s): HGBA1C in the last 72 hours. CBG: Recent Labs  Lab 10/01/17 1751 10/01/17 2329 10/02/17 0107 10/02/17 0515 10/02/17 1150  GLUCAP 153* 266* 248* 227* 166*   Lipid Profile: No  results for input(s): CHOL, HDL, LDLCALC, TRIG, CHOLHDL, LDLDIRECT in the last 72 hours. Thyroid Function Tests: No results for input(s): TSH, T4TOTAL, FREET4, T3FREE, THYROIDAB in the last 72 hours. Anemia Panel: No results for input(s): VITAMINB12, FOLATE, FERRITIN, TIBC, IRON, RETICCTPCT in the last 72 hours. Sepsis Labs: No results for input(s): PROCALCITON, LATICACIDVEN in the last 168 hours.  Recent Results (from the past 240 hour(s))  Culture, blood (Routine X 2) w Reflex to ID Panel     Status: None   Collection Time: 09/25/17  7:28 PM  Result Value Ref Range Status   Specimen Description   Final    BLOOD LEFT ANTECUBITAL Performed at [facility] [address], [place], [place] [address]    Special Requests   Final    BOTTLES DRAWN AEROBIC AND ANAEROBIC Blood Culture adequate volume Performed at [facility] [address] [place], [place] [address]    Culture   Final    NO GROWTH 5 DAYS Performed at [facility], [place],  [place] [address]    Report Status 10/01/2017 FINAL  Final  Culture, blood (Routine X 2) w Reflex to ID Panel     Status: None   Collection Time: 09/25/17  7:28 PM  Result Value Ref Range Status   Specimen Description   Final    BLOOD LEFT HAND Performed at [facility], [place] [address], [place], [place] [address]    Special Requests   Final    BOTTLES DRAWN AEROBIC AND ANAEROBIC Blood Culture results may not be optimal due to an inadequate volume of blood received in culture bottles Performed at [facility] [address], [place], [place] [address]    Culture   Final    NO GROWTH 5 DAYS Performed at [facility], [place] [address] [place],  [address]    Report Status 10/01/2017 FINAL  Final         Radiology Studies: No results found.      Scheduled Meds: . alteplase  2 mg Intracatheter Once  . collagenase   Topical Daily  .  digoxin  0.25 mg Intravenous Once  . feeding supplement (ENSURE ENLIVE)  237 mL Oral TID BM  . insulin aspart  0-9 Units Subcutaneous Q6H  . ketorolac  1 drop Both Eyes QID  . lidocaine  1 patch Transdermal QAC breakfast  . mouth rinse  15 mL Mouth Rinse BID  . metoprolol tartrate  5 mg Intravenous Once  . metoprolol tartrate  75 mg Oral BID  . mirtazapine  15 mg Oral QHS  . multivitamin with minerals  1 tablet Oral Daily  . nystatin  5 mL Oral Q8H  . polyethylene glycol  17 g Oral BID  . prednisoLONE acetate  1 drop Both Eyes QID  . sodium chloride flush  10-40 mL Intracatheter Q12H  . venetoclax  400 mg Oral Daily   Continuous Infusions: . 0.45 % NaCl with KCl 20 mEq / L    . ceFEPime (MAXIPIME) IV 2 g (10/02/17 1222)     LOS: 26 days     Cordelia Poche, MD [facility] 10/02/2017, 12:40 PM Pager: [phone]  If 7PM-7AM, please contact night-coverage [email or web address] 10/02/2017, 12:40 PM

## 2017-10-02 NOTE — Progress Notes (Signed)
Pharmacy Antibiotic Note  Leah Lewis is a 83 y.o. female admitted on 09/14/2017 with AML getting chemotherapy and already completed a course of antibiotics for possible infected decubitus ulcer. Cefepime resumed on 6/24 for febrile neutropenia/ pneumonia.  Today, 10/02/2017: - day #6 cefepime - afeb, wbc wnl; ANC 1.5 on 6/29 - scr stable (crcl~47) - all cultures have been negative  Plan: - continue Cefepime 2g IV q12h - f/u plan/LOT for abx ____________________________________  Height: 5\' 4"  (162.6 cm) Weight: 139 lb (63 kg) IBW/kg (Calculated) : 54.7  Temp (24hrs), Avg:99 F (37.2 C), Min:98.5 F (36.9 C), Max:99.7 F (37.6 C)  Recent Labs  Lab 09/28/17 0630  09/29/17 0650  09/30/17 0540 09/30/17 1800 10/01/17 0428 10/01/17 1931 10/02/17 0550  WBC 7.5  --  6.8  --  4.8  --  5.4  --  5.8  CREATININE 0.78   < > 0.78   < > 0.77 0.79 0.74 0.73 0.81   < > = values in this interval not displayed.    Estimated Creatinine Clearance: 47 mL/min (by C-G formula based on SCr of 0.81 mg/dL).    Allergies  Allergen Reactions  . Percocet [Oxycodone-Acetaminophen] Other (See Comments)    "patient can take" but prefers not to    Antimicrobials this admission: 6/8 cefepime >> 6/14, resumed 6/24 >>  6/10 vancomycin >> 6/14 6/14 meropenem >> 6/18 6/19 fluconazole (thrush) >> 6/24 (DC d/t drug-drug interaction with Venetoclax, continue nystatin alone)  Microbiology results: 6/3 BCx: NGF 6/8 BCx: NGF 6/10 BCx: NGF 6/10 MRSA PCR: negative  6/15 Bcx: NGF 6/23 BCx x2: neg FINAL   Jahyra Sukup P, Pharm.D. PGY1 Pharmacy Resident 10/02/2017 9:57 AM

## 2017-10-03 ENCOUNTER — Inpatient Hospital Stay (HOSPITAL_COMMUNITY): Payer: Medicare Other

## 2017-10-03 DIAGNOSIS — D630 Anemia in neoplastic disease: Secondary | ICD-10-CM

## 2017-10-03 LAB — GLUCOSE, CAPILLARY
GLUCOSE-CAPILLARY: 190 mg/dL — AB (ref 70–99)
GLUCOSE-CAPILLARY: 202 mg/dL — AB (ref 70–99)
GLUCOSE-CAPILLARY: 173 mg/dL — AB (ref 70–99)
Glucose-Capillary: 200 mg/dL — ABNORMAL HIGH (ref 70–99)

## 2017-10-03 LAB — PREPARE PLATELET PHERESIS: Unit division: 0

## 2017-10-03 LAB — BPAM PLATELET PHERESIS
Blood Product Expiration Date: 201907012359
ISSUE DATE / TIME: 201906301650
UNIT TYPE AND RH: 6200

## 2017-10-03 LAB — COMPREHENSIVE METABOLIC PANEL
ALBUMIN: 1.5 g/dL — AB (ref 3.5–5.0)
ALT: 29 U/L (ref 0–44)
AST: 28 U/L (ref 15–41)
Alkaline Phosphatase: 69 U/L (ref 38–126)
Anion gap: 9 (ref 5–15)
BILIRUBIN TOTAL: 2.3 mg/dL — AB (ref 0.3–1.2)
BUN: 29 mg/dL — ABNORMAL HIGH (ref 8–23)
CO2: 32 mmol/L (ref 22–32)
Calcium: 7.1 mg/dL — ABNORMAL LOW (ref 8.9–10.3)
Chloride: 104 mmol/L (ref 98–111)
Creatinine, Ser: 0.75 mg/dL (ref 0.44–1.00)
GFR calc Af Amer: >60 mL/min (ref >60)
GFR calc non Af Amer: >60 mL/min (ref >60)
GLUCOSE: 217 mg/dL — AB (ref 70–99)
Potassium: 2.9 mmol/L — ABNORMAL LOW (ref 3.5–5.1)
SODIUM: 145 mmol/L (ref 135–145)
TOTAL PROTEIN: 5.3 g/dL — AB (ref 6.5–8.1)

## 2017-10-03 LAB — CBC WITH DIFFERENTIAL/PLATELET
BLASTS: 3 %
Basophils Absolute: 0 10*3/uL (ref 0.0–0.1)
Basophils Relative: 0 %
EOS PCT: 0 %
Eosinophils Absolute: 0 10*3/uL (ref 0.0–0.7)
HCT: 23.3 % — ABNORMAL LOW (ref 36.0–46.0)
Hemoglobin: 7.5 g/dL — ABNORMAL LOW (ref 12.0–15.0)
Lymphocytes Relative: 16 %
Lymphs Abs: 0.5 10*3/uL — ABNORMAL LOW (ref 0.7–4.0)
MCH: 30.4 pg (ref 26.0–34.0)
MCHC: 32.2 g/dL (ref 30.0–36.0)
MCV: 94.3 fL (ref 78.0–100.0)
MONO ABS: 1.3 10*3/uL — AB (ref 0.1–1.0)
Metamyelocytes Relative: 1 %
Monocytes Relative: 39 %
Myelocytes: 4 %
NEUTROS ABS: 1.4 10*3/uL — AB (ref 1.7–7.7)
NEUTROS PCT: 37 %
PLATELETS: 18 10*3/uL — AB (ref 150–400)
RBC: 2.47 MIL/uL — AB (ref 3.87–5.11)
RDW: 16.7 % — AB (ref 11.5–15.5)
WBC: 3.3 10*3/uL — AB (ref 4.0–10.5)

## 2017-10-03 LAB — URIC ACID: Uric Acid, Serum: 3.2 mg/dL (ref 2.5–7.1)

## 2017-10-03 LAB — LACTATE DEHYDROGENASE: LDH: 429 U/L — ABNORMAL HIGH (ref 98–192)

## 2017-10-03 LAB — OCCULT BLOOD X 1 CARD TO LAB, STOOL: FECAL OCCULT BLD: NEGATIVE

## 2017-10-03 LAB — PHOSPHORUS: Phosphorus: 3 mg/dL (ref 2.5–4.6)

## 2017-10-03 MED ORDER — POTASSIUM CHLORIDE 20 MEQ/15ML (10%) PO SOLN
40.0000 meq | Freq: Once | ORAL | Status: DC
  Filled 2017-10-03: qty 30

## 2017-10-03 NOTE — Progress Notes (Addendum)
PROGRESS NOTE    Leah Lewis  ZJI:[id number] DOB: 1934/11/11 DOA: 10/01/2017 PCP: Wenda Low, MD   Brief Narrative: Leah Lewis is a 83 y.o. female with past medical history of osteoporosis, hyperlipidemia, remote DVT on Xarelto. Patient presented secondary to generalized weakness and found to be pancytopenic secondary to AML. She has received multiple PRBC and platelet transfusions. She also developed atrial fibrillation with RVR which is currently controled. She has now been started on chemotherapy.   Assessment & Plan:   Active Problems:   Pressure injury of skin   AML (acute myeloid leukemia) (HCC)   Goals of care, counseling/discussion   AML (acute myeloblastic leukemia) ([place])   Advance care planning   Palliative care by specialist   Fever   Pancytopenia ([place])   Atrial fibrillation with RVR ([place])   Thrush   Thrombocytopenia ([place])   Advanced care planning/counseling discussion   AML Patient started on oral chemotherapy 6/24. Afebrile. Grave prognosis per medical oncology. She has been transfused a total of 7 units PRBC and 11 units platelets. Platelets improved after transfusion again -Medical oncology recommendations: continue venetoclax, transfuse for platelets <10k -Daily labs (BMP, CBC, LDH, FOBT, phosphorus, uric acid) -Transfuse 1 unit of PRBC  Atrial fibrillation with RVR Rate better controlled today. BP stable. Not on anticoagulation secondary to thrombocytopenia. Was treated with Cardizem and transitioned to metoprolol secondary to drug interaction with chemotherapy -Continue to metoprolol 75 mg BID; watch blood pressure  Right middle lobe pneumonia Started on antibiotics. Stable. Afebrile. Completed cefepime course  Essential hypertension Stable. Well controlled. -Continue metoprolol as above  Unstageable sacral pressure ulcer Wound care recommended hydrotherapy. Unable to perform hydrotherapy secondary to recurrent significant  thrombocytopenia  Malnutrition, unknown severity Per dietitian notes, patient may benefit from feeding tube. Recurrent thrombocytopenia makes it unsafe to place a feeding tube currently.  Oral intake has improved. -Continue oral nutrition  Oral thrush Treated with fluconazole  Hypokalemia -Supplementation and repeat BMP  Acute respiratory failure with hypoxia In setting of pneumonia. Pneumonia treated. Rales on exam -Chest x-ray   DVT prophylaxis: SCDs Code Status:   Code Status: Full Code Family Communication: None at bedside Disposition Plan: Discharge to SNF when medically stable   Consultants:   Medical oncology  Palliative care medicine  Procedures:   6/3: 3 units PRBC  6/10: 2 units platelets  6/14: 2 units platelets  6/19: 2 units PRBC and 2 units platelets  6/22: 1 unit plalets  6/25: 2 units PRBC and 2 units platelets  6/28: 1 unit platelets  6/30: 1 unit platelets  Antimicrobials:  Vancomycin  Meropenem  Fluconazole  Cefepime    Subjective: No chest pain or dyspnea.  Objective: Vitals:   10/03/17 0228 10/03/17 0453 10/03/17 0457 10/03/17 0836  BP: (!) 90/50 93/67  (!) 136/109  Pulse: 96 92  (!) 106  Resp:  16    Temp:  98.1 F (36.7 C)    TempSrc:      SpO2: 98% 100%    Weight:   65.3 kg (144 lb)   Height:        Intake/Output Summary (Last 24 hours) at 10/03/2017 1244 Last data filed at 10/03/2017 0651 Gross per 24 hour  Intake 2491.75 ml  Output 1700 ml  Net 791.75 ml   Filed Weights   10/01/17 1121 10/02/17 0400 10/03/17 0457  Weight: 62.1 kg (137 lb) 63 kg (139 lb) 65.3 kg (144 lb)    Examination:  General exam: Appears calm and  comfortable Respiratory system: Rales on left side. Respiratory effort normal. Cardiovascular system: S1 & S2 heard, RRR. No murmurs, rubs, gallops or clicks. Gastrointestinal system: Abdomen is nondistended, soft and nontender. Normal bowel sounds heard. Central nervous system: Alert and  oriented. No focal neurological deficits. Extremities: No edema. No calf tenderness Skin: No cyanosis. No rashes Psychiatry: Judgement and insight appear normal. Mood & affect appropriate.    Data Reviewed: I have personally reviewed following labs and imaging studies  CBC: Recent Labs  Lab 09/29/17 0650 09/30/17 0540 10/01/17 0428 10/02/17 0550 10/03/17 0555  WBC 6.8 4.8 5.4 5.8 3.3*  NEUTROABS 3.7 2.3 2.3 2.7 1.4*  HGB 10.6* 9.7* 9.1* 10.2* 7.5*  HCT 31.9* 29.6* 27.8* 31.8* 23.3*  MCV 91.4 91.6 93.3 93.5 94.3  PLT 12* 7* 18* 6* 18*   Basic Metabolic Panel: Recent Labs  Lab 10/01/17 0428 10/01/17 1931 10/02/17 0550 10/02/17 1944 10/03/17 0555  NA 148* 145 148* 147* 145  K 2.9* 3.0* 3.3* 2.9* 2.9*  CL 108 105 105 107 104  CO2 31 31 31 31  32  GLUCOSE 152* 157* 244* 242* 217*  BUN 26* 23 27* 29* 29*  CREATININE 0.74 0.73 0.81 0.78 0.75  CALCIUM 7.3* 7.3* 7.7* 7.1* 7.1*  MG 1.4*  --   --   --   --   PHOS 2.7 2.3* 2.7 3.5 3.0   GFR: Estimated Creatinine Clearance: 47.6 mL/min (by C-G formula based on SCr of 0.75 mg/dL). Liver Function Tests: Recent Labs  Lab 09/29/17 0650 09/30/17 0540 10/01/17 0428 10/02/17 0550 10/03/17 0555  AST 29 28 31  43* 28  ALT 33 29 29 38 29  ALKPHOS 81 76 69 83 69  BILITOT 2.9* 2.5* 2.7* 3.2* 2.3*  PROT 6.0* 5.5* 5.7* 6.2* 5.3*  ALBUMIN 1.7* 1.5* 1.7* 1.7* 1.5*   No results for input(s): LIPASE, AMYLASE in the last 168 hours. No results for input(s): AMMONIA in the last 168 hours. Coagulation Profile: No results for input(s): INR, PROTIME in the last 168 hours. Cardiac Enzymes: No results for input(s): CKTOTAL, CKMB, CKMBINDEX, TROPONINI in the last 168 hours. BNP (last 3 results) No results for input(s): PROBNP in the last 8760 hours. HbA1C: No results for input(s): HGBA1C in the last 72 hours. CBG: Recent Labs  Lab 10/02/17 1150 10/02/17 1657 10/03/17 0123 10/03/17 0604 10/03/17 1157  GLUCAP 166* 259* 190* 202* 173*    Lipid Profile: No results for input(s): CHOL, HDL, LDLCALC, TRIG, CHOLHDL, LDLDIRECT in the last 72 hours. Thyroid Function Tests: No results for input(s): TSH, T4TOTAL, FREET4, T3FREE, THYROIDAB in the last 72 hours. Anemia Panel: No results for input(s): VITAMINB12, FOLATE, FERRITIN, TIBC, IRON, RETICCTPCT in the last 72 hours. Sepsis Labs: No results for input(s): PROCALCITON, LATICACIDVEN in the last 168 hours.  Recent Results (from the past 240 hour(s))  Culture, blood (Routine X 2) w Reflex to ID Panel     Status: None   Collection Time: 09/25/17  7:28 PM  Result Value Ref Range Status   Specimen Description   Final    BLOOD LEFT ANTECUBITAL Performed at [facility] [address], [place], [place] [address]    Special Requests   Final    BOTTLES DRAWN AEROBIC AND ANAEROBIC Blood Culture adequate volume Performed at [facility] [address], [place], [place] [address]    Culture   Final    NO GROWTH 5 DAYS Performed at [facility], [place],  [place] [address]    Report Status 10/01/2017 FINAL  Final  Culture, blood (Routine X 2) w Reflex to ID Panel     Status: None   Collection Time: 09/25/17  7:28 PM  Result Value Ref Range Status   Specimen Description   Final    BLOOD LEFT HAND Performed at [facility], [place] [address] [place], [place] [address]    Special Requests   Final    BOTTLES DRAWN AEROBIC AND ANAEROBIC Blood Culture results may not be optimal due to an inadequate volume of blood received in culture bottles Performed at [facility] [address], [place], [place] [address]    Culture   Final    NO GROWTH 5 DAYS Performed at [facility], [place] [address] [place], [place] [address]    Report Status 10/01/2017 FINAL  Final         Radiology Studies: No results found.      Scheduled Meds: . collagenase   Topical Daily  . digoxin  0.25 mg  Intravenous Once  . feeding supplement (ENSURE ENLIVE)  237 mL Oral TID BM  . insulin aspart  0-9 Units Subcutaneous Q6H  . ketorolac  1 drop Both Eyes QID  . lidocaine  1 patch Transdermal QAC breakfast  . mouth rinse  15 mL Mouth Rinse BID  . metoprolol tartrate  75 mg Oral BID  . mirtazapine  15 mg Oral QHS  . multivitamin with minerals  1 tablet Oral Daily  . nystatin  5 mL Oral Q8H  . polyethylene glycol  17 g Oral BID  . prednisoLONE acetate  1 drop Both Eyes QID  . sodium chloride flush  10-40 mL Intracatheter Q12H  . venetoclax  400 mg Oral Daily   Continuous Infusions: . 0.45 % NaCl with KCl 20 mEq / L 75 mL/hr at 10/03/17 0651     LOS: 27 days     Cordelia Poche, MD [facility] 10/03/2017, 12:44 PM Pager: [phone]  If 7PM-7AM, please contact night-coverage [email or web address] 10/03/2017, 12:44 PM

## 2017-10-03 NOTE — Progress Notes (Signed)
Nutrition Follow-up  DOCUMENTATION CODES:   Not applicable  INTERVENTION:  - Continue Ensure Enlive BID and Magic Cup TID. - Continue to encourage PO intakes.  - PO intakes remain inadequate. If patient becomes a candidate and if within [place], PEG may be of benefit.  NUTRITION DIAGNOSIS:   Increased nutrient needs related to wound healing as evidenced by estimated needs. -ongoing  GOAL:   Patient will meet greater than or equal to 90% of their needs -unmet  MONITOR:   PO intake, Supplement acceptance, Labs, Weight trends, Skin, I & O's  ASSESSMENT:   83 y.o. female with medical history significant of hypertension, prior stroke hx of left DVT.  Patient presented to the emergency department complaining of generalized weakness and fatigue.   Weight has been fluctuating throughout admission. Since RD assessment on 6/24, weight has been stable and patient has been eating 10-50% of meals. Most recent intake was 15% of lunch yesterday. Patient is currently sleeping with sheet and blanket pulled over her entire head and no family or visitors present. Breakfast tray on bedside table and is untouched. Patient has been accepting Ensure ~50% of the time it is offered. Per RN note, patient was unable to swallow venetoclax this AM.  Per Dr. Lisbeth Ply note yesterday afternoon: AML started on oral chemo 6/24 with poor prognosis per medical oncology, afib with RVR and rate uncontrolled yesterday (6/30), R lobe PNA stable, patient unsafe for feeding tube given recurrent thrombocytopenia, oral thrush. Plan for d/c to SNF when medically feasible.    Medications reviewed; sliding scale Novolog, daily multivitamin with minerals, 5 mL Mycostatin TID, 1 packet Miralax BID, 400 mg venetoclax/day starting 6/28. Labs reviewed; CBG: 190 and 202 mg/dL this AM, K: 2.9 mmol/L, BUN: 29 mg/dL, Ca: 7.1 mg/dL.  IVF: 1/2 NS-20 mEq KCl @ 75 mL/hr.     Diet Order:   Diet Order           DIET - DYS 1 Room service  appropriate? Yes; Fluid consistency: Thin  Diet effective now          EDUCATION NEEDS:   Not appropriate for education at this time  Skin:  Skin Assessment: Skin Integrity Issues: Skin Integrity Issues:: Unstageable Stage II: coccyx Unstageable: full thickness to sacrum and coccyx  Last BM:  6/30  Height:   Ht Readings from Last 1 Encounters:  09/06/17 5\' 4"  (1.626 m)    Weight:   Wt Readings from Last 1 Encounters:  10/03/17 144 lb (65.3 kg)    Ideal Body Weight:  54.5 kg  BMI:  Body mass index is 24.72 kg/m.  Estimated Nutritional Needs:   Kcal:  2025-2160 (30-32 kcal/kg)  Protein:  108-120 grams (1.6-1.8 grams/kg)  Fluid:  1.7L/day     Jarome Matin, MS, RD, LDN, CNSC Inpatient Clinical Dietitian Pager # [phone] After hours/weekend pager # [phone]

## 2017-10-03 NOTE — Progress Notes (Signed)
Patient not medically stable for dc.   LCSW will continue to follow for dc needs.   Leah Lewis [name]

## 2017-10-03 NOTE — Progress Notes (Signed)
Marland [facility]   HEMATOLOGY/ONCOLOGY INPATIENT PROGRESS NOTE  Date of Service: 10/03/2017  Inpatient Attending: .Mariel Aloe, MD  SUBJECTIVE:   Leah Lewis reports that she is doing well overall.  Is confused at times and appears to be mostly in bed.  The pt reports that she is eating most of her food though her plate was full when she was seen. She has not been able to get out of bed to walk around. She has been able to move her bowels and denies any current abdominal pains.  Lab results today (10/03/17) of CBC w/diff is as follows: all values are WNL except for WBC at 3.3k, RBC at 2.47, HGB at 7.5, HCT at 23.3, RDW at 16.7, PLT at 18k, ANC at 1.4k, Lymphs abs at 500, Monocytes abs at 1.3k.  On review of systems, pt reports moving her bowels, eating some food, and denies much activity, abdominal pains, and any other symptoms.   OBJECTIVE:  PHYSICAL EXAMINATION: . Vitals:   10/04/17 0934 10/04/17 0941 10/04/17 1515 10/04/17 1632  BP: (!) 93/52 (!) 90/50 (!) 101/57 140/70  Pulse: (!) 120   (!) 135  Resp:   (!) 35 (!) 30  Temp:      TempSrc:      SpO2:    90%  Weight:      Height:       Filed Weights   10/01/17 1121 10/02/17 0400 10/03/17 0457  Weight: 137 lb (62.1 kg) 139 lb (63 kg) 144 lb (65.3 kg)   .Body mass index is 24.72 kg/m.  GENERAL:alert, somewhat restless, intermittently confused SKIN: no acute rashes EYES: conjunctiva are pink and non-injected, sclera anicteric. OROPHARYNX: MMM, no exudates, no oropharyngeal erythema or ulceration. NECK: supple, no JVD LUNGS: clear to auscultation b/l with normal respiratory effort. HEART: regular rate & rhythm ABDOMEN:  normoactive bowel sounds , non tender, not distended. Extremity: no pedal edema PSYCH: alert with fluent speech NEURO: no focal motor/sensory deficits.  MEDICAL HISTORY:   Past Medical History:  Diagnosis Date  . AML (acute myeloid leukemia) ([place]) 09/12/2017  . Atrial fibrillation ([place]) 09/15/2017  . Goals  of care, counseling/discussion 09/12/2017  . High cholesterol   . Hypertension   . Stroke ([place])   . Thrush 09/22/2017    SURGICAL HISTORY: Past Surgical History:  Procedure Laterality Date  . ABDOMINAL HYSTERECTOMY    . CATARACT EXTRACTION     bilateral  . CESAREAN SECTION      SOCIAL HISTORY: Social History   Socioeconomic History  . Marital status: Single    Spouse name: Not on file  . Number of children: Not on file  . Years of education: Not on file  . Highest education level: Not on file  Occupational History  . Not on file  Social Needs  . Financial resource strain: Not on file  . Food insecurity:    Worry: Not on file    Inability: Not on file  . Transportation needs:    Medical: Not on file    Non-medical: Not on file  Tobacco Use  . Smoking status: Never Smoker  . Smokeless tobacco: Former Systems developer    Types: Chew  Substance and Sexual Activity  . Alcohol use: No  . Drug use: No  . Sexual activity: Not on file  Lifestyle  . Physical activity:    Days per week: Not on file    Minutes per session: Not on file  . Stress: Not on file  Relationships  .  Social connections:    Talks on phone: Not on file    Gets together: Not on file    Attends religious service: Not on file    Active member of club or organization: Not on file    Attends meetings of clubs or organizations: Not on file    Relationship status: Not on file  . Intimate partner violence:    Fear of current or ex partner: Not on file    Emotionally abused: Not on file    Physically abused: Not on file    Forced sexual activity: Not on file  Other Topics Concern  . Not on file  Social History Narrative  . Not on file    FAMILY HISTORY: Family History  Problem Relation Age of Onset  . Heart attack Mother   . Pneumonia Father   . Parkinsonism Father   . Breast cancer Sister     ALLERGIES:  is allergic to percocet [oxycodone-acetaminophen].  MEDICATIONS:  Scheduled Meds: . collagenase    Topical Daily  . digoxin  0.25 mg Intravenous Once  . feeding supplement (ENSURE ENLIVE)  237 mL Oral TID BM  . insulin aspart  0-9 Units Subcutaneous Q6H  . ketorolac  1 drop Both Eyes QID  . lidocaine  1 patch Transdermal QAC breakfast  . mouth rinse  15 mL Mouth Rinse BID  . metoprolol tartrate  75 mg Oral BID  . mirtazapine  15 mg Oral QHS  . multivitamin with minerals  1 tablet Oral Daily  . nystatin  5 mL Oral Q8H  . polyethylene glycol  17 g Oral BID  . potassium chloride  40 mEq Oral Once  . prednisoLONE acetate  1 drop Both Eyes QID  . sodium chloride flush  10-40 mL Intracatheter Q12H  . venetoclax  400 mg Oral Daily   Continuous Infusions: . 0.45 % NaCl with KCl 20 mEq / L 75 mL/hr at 10/03/17 0651   PRN Meds:.acetaminophen **OR** acetaminophen, alteplase, heparin lock flush, heparin lock flush, heparin lock flush, heparin lock flush, HYDROcodone-acetaminophen, labetalol, ondansetron (ZOFRAN) IV, sodium chloride flush, sodium chloride flush, sodium chloride flush  REVIEW OF SYSTEMS:    A 10+ POINT REVIEW OF SYSTEMS WAS OBTAINED including neurology, dermatology, psychiatry, cardiac, respiratory, lymph, extremities, GI, GU, Musculoskeletal, constitutional, breasts, reproductive, HEENT.  All pertinent positives are noted in the HPI.  All others are negative.   LABORATORY DATA:  I have reviewed the data as listed  . CBC Latest Ref Rng & Units 10/03/2017 10/02/2017 10/01/2017  WBC 4.0 - 10.5 K/uL 3.3(L) 5.8 5.4  Hemoglobin 12.0 - 15.0 g/dL 7.5(L) 10.2(L) 9.1(L)  Hematocrit 36.0 - 46.0 % 23.3(L) 31.8(L) 27.8(L)  Platelets 150 - 400 K/uL 18(LL) 6(LL) 18(LL)    CMP Latest Ref Rng & Units 10/03/2017 10/02/2017 10/02/2017  Glucose 70 - 99 mg/dL 217(H) 242(H) 244(H)  BUN 8 - 23 mg/dL 29(H) 29(H) 27(H)  Creatinine 0.44 - 1.00 mg/dL 0.75 0.78 0.81  Sodium 135 - 145 mmol/L 145 147(H) 148(H)  Potassium 3.5 - 5.1 mmol/L 2.9(L) 2.9(L) 3.3(L)  Chloride 98 - 111 mmol/L 104 107 105  CO2  22 - 32 mmol/L 32 31 31  Calcium 8.9 - 10.3 mg/dL 7.1(L) 7.1(L) 7.7(L)  Total Protein 6.5 - 8.1 g/dL 5.3(L) - 6.2(L)  Total Bilirubin 0.3 - 1.2 mg/dL 2.3(H) - 3.2(H)  Alkaline Phos 38 - 126 U/L 69 - 83  AST 15 - 41 U/L 28 - 43(H)  ALT 0 - 44 U/L  29 - 38     RADIOGRAPHIC STUDIES: I have personally reviewed the radiological images as listed and agreed with the findings in the report. Dg Chest 1 View  Result Date: 09/25/2017 CLINICAL DATA:  Fever. EXAM: CHEST  1 VIEW COMPARISON:  09/21/2017. FINDINGS: Stable enlarged cardiac silhouette. Stable patchy opacity in the left mid lung zone with mildly increased patchy opacity in the left lower lung zone. Interval small amount of patchy opacity beneath the minor fissure on the right. Stable mildly prominent interstitial markings. Thoracic spine degenerative changes. Right PICC tip in the inferior aspect of the superior vena cava approximately 1.5 cm above the superior cavoatrial junction. IMPRESSION: 1. Mildly increased left lung pneumonia. 2. Interval small amount of probable pneumonia in the right mid to lower lung zone, possibly in the right middle lobe. 3. Stable cardiomegaly and mild chronic interstitial lung disease. Electronically Signed   By: Claudie Revering M.D.   On: 09/25/2017 21:02   Dg Chest 2 View  Result Date: 09/03/2017 CLINICAL DATA:  Generalized weakness EXAM: CHEST - 2 VIEW COMPARISON:  03/02/2015 FINDINGS: The lungs are well inflated. The cardiomediastinal contours are normal. Hazy opacities in the right lung base are unchanged. There is no pleural effusion or pneumothorax. IMPRESSION: No active cardiopulmonary disease. Electronically Signed   By: Ulyses Jarred M.D.   On: 09/04/2017 15:25   Ct Chest Wo Contrast  Result Date: 09/19/2017 CLINICAL DATA:  Generalized weakness and fatigue. Fever, anemia. AML. EXAM: CT CHEST WITHOUT CONTRAST TECHNIQUE: Multidetector CT imaging of the chest was performed following the standard protocol without  IV contrast. COMPARISON:  None. FINDINGS: Cardiovascular: Right PICC tip terminates junction at the SVC. Atherosclerotic calcification of the arterial vasculature, including coronary arteries. Pulmonary arteries and heart are enlarged. No pericardial effusion. Mediastinum/Nodes: Thyroid is heterogeneous. Mediastinal lymph nodes measure up to 10 mm in the low right paratracheal station. Hilar regions are difficult to evaluate without IV contrast. Subpectoral and axillary lymph nodes are not enlarged by CT size criteria. Esophagus is grossly unremarkable. Lungs/Pleura: Image quality is rather degraded by respiratory motion. There is perihilar peribronchovascular ground-glass and consolidation with peripheral sparing. Septal thickening is not an overwhelming feature. No pleural fluid. Airway is unremarkable. Upper Abdomen: Visualized portions of the liver, adrenal glands, kidneys, spleen, pancreas, stomach and bowel are grossly unremarkable. Musculoskeletal: Degenerative changes in the spine. No worrisome lytic or sclerotic lesions. IMPRESSION: 1. Perihilar peribronchovascular ground-glass and consolidation with peripheral sparing. Findings may be due to leukostasis related to AML, pulmonary hemorrhage or pulmonary edema. 2. Borderline enlarged mediastinal lymph nodes. 3. Aortic atherosclerosis (ICD10-170.0). Coronary artery calcification. 4. Enlarged pulmonary arteries, indicative of pulmonary arterial hypertension. Electronically Signed   By: Lorin Picket M.D.   On: 09/19/2017 16:56   Mr Sacrum Si Joints Wo Contrast  Result Date: 09/13/2017 CLINICAL DATA:  Osteomyelitis of the sacrum suspected. Possible history of lymphoma or leukemia. Pancytopenia. EXAM: MRI PELVIS WITHOUT CONTRAST TECHNIQUE: Multiplanar multisequence MR imaging of the pelvis was performed. No intravenous contrast was administered. COMPARISON:  04/26/2007 pelvis MRI FINDINGS: Urinary Tract: Urinary bladder is unremarkable. No evidence of  distal hydroureter. Bowel:  Unremarkable visualized pelvic bowel loops. Vascular/Lymphatic: No pathologically enlarged lymph nodes. No significant vascular abnormality seen. Reproductive:  Hysterectomy.  No adnexal mass. Other:  No free fluid. Musculoskeletal: Patchy heterogeneous marrow pattern of the bony pelvis and sacrum. Slight marrow edema of the right iliac bone posteriorly may reflect stigmata of recent marrow biopsy. Redemonstration of degenerative joint space narrowing of  both hips minimal spurring at the femoral head-neck junction bilaterally. There is lower lumbar degenerative disc and facet arthropathy of the included L4-5 and L5-S1 levels. Sacroiliac joints are maintained bilaterally. Pubic symphysis is intact. No marrow signal abnormality suspicious for acute fracture, frank bone destruction or osteomyelitis. Edema of the left obturator internus and pectineus muscles compatible with muscle strain. IMPRESSION: 1. New heterogeneous ill-defined marrow pattern of the included pelvis and sacrum that may reflect and infiltrative marrow abnormality such as leukemia or lymphoma. 2. Mild marrow edema involving the right iliac bone would be in keeping with the patient's recent bone marrow biopsy site. 3. No conclusive evidence for osteomyelitis. 4. Left pectineus and obturator internus muscle strains. Electronically Signed   By: Ashley Royalty M.D.   On: 09/13/2017 19:02   Ct Biopsy  Result Date: 09/07/2017 INDICATION: Lymphoma versus leukemia, pancytopenia EXAM: CT GUIDED RIGHT ILIAC BONE MARROW ASPIRATION AND CORE BIOPSY Date:  09/07/2017 09/07/2017 12:00 pm Radiologist:  Jerilynn Mages. Daryll Brod, MD Guidance:  CT FLUOROSCOPY TIME:  Fluoroscopy Time: None. MEDICATIONS: 1% lidocaine local ANESTHESIA/SEDATION: 0.5 mg IV Versed; 25 mcg IV Fentanyl Moderate Sedation Time:  10 minutes The patient was continuously monitored during the procedure by the interventional radiology nurse under my direct supervision. CONTRAST:  None.  COMPLICATIONS: None PROCEDURE: Informed consent was obtained from the patient following explanation of the procedure, risks, benefits and alternatives. The patient understands, agrees and consents for the procedure. All questions were addressed. A time out was performed. The patient was positioned prone and non-contrast localization CT was performed of the pelvis to demonstrate the iliac marrow spaces. Maximal barrier sterile technique utilized including caps, mask, sterile gowns, sterile gloves, large sterile drape, hand hygiene, and Betadine prep. Under sterile conditions and local anesthesia, an 11 gauge coaxial bone biopsy needle was advanced into the right iliac marrow space. Needle position was confirmed with CT imaging. Initially, bone marrow aspiration was performed. Next, the 11 gauge outer cannula was utilized to obtain a right iliac bone marrow core biopsy. Needle was removed. Hemostasis was obtained with compression. The patient tolerated the procedure well. Samples were prepared with the cytotechnologist. No immediate complications. IMPRESSION: CT guided right iliac bone marrow aspiration and core biopsy. Electronically Signed   By: Jerilynn Mages.  Shick M.D.   On: 09/07/2017 12:23   Dg Chest Port 1 View  Result Date: 10/03/2017 CLINICAL DATA:  Acute respiratory failure EXAM: PORTABLE CHEST 1 VIEW COMPARISON:  09/28/2017 FINDINGS: The cardiac shadow is again enlarged. Right-sided PICC line is again seen. Patchy infiltrate is noted in the left upper lung but improved from the prior exam. The right lung is now clear. No bony abnormality is noted. IMPRESSION: Improving infiltrates with mild residual on the left. Electronically Signed   By: Inez Catalina M.D.   On: 10/03/2017 14:00   Dg Chest Port 1 View  Result Date: 09/28/2017 CLINICAL DATA:  Follow-up pneumonia EXAM: PORTABLE CHEST 1 VIEW COMPARISON:  09/25/2017 FINDINGS: Cardiac shadow remains enlarged. Lungs are well aerated bilaterally. Persistent  left-sided infiltrate and mild right basilar infiltrate is noted. No significant interval change is noted. No effusion is seen. Degenerative changes of the thoracic spine are noted. Right-sided PICC line is again noted and stable. IMPRESSION: Stable bilateral infiltrates left greater than right. Electronically Signed   By: Inez Catalina M.D.   On: 09/28/2017 09:22   Dg Chest Port 1 View  Result Date: 09/21/2017 CLINICAL DATA:  Shortness of breath and fever. Acute myeloid leukemia, atrial fibrillation.  EXAM: PORTABLE CHEST 1 VIEW COMPARISON:  Chest x-ray of September 17, 2017 and chest CT scan of September 19, 2017. FINDINGS: The lungs are adequately inflated. The new confluent airspace opacities are present greatest on the left. There is no pleural effusion or pneumothorax. The heart is enlarged. The pulmonary vascularity is indistinct but definite cephalization is not observed. There is calcification in the wall of the thoracic aorta. The right-sided PICC line tip projects over the midportion of the SVC. IMPRESSION: Interval development of airspace opacities greatest on the left most compatible with pneumonia. Underlying low-grade CHF with stable cardiomegaly. Thoracic aortic atherosclerosis. Electronically Signed   By: David  Lewis M.D.   On: 09/21/2017 08:20   Dg Chest Port 1 View  Result Date: 09/17/2017 CLINICAL DATA:  Fever EXAM: PORTABLE CHEST 1 VIEW COMPARISON:  09/12/2017 FINDINGS: There is cardiomegaly. Vascular congestion. Bilateral patchy airspace opacities, left greater than right. This could reflect asymmetric edema or infection. No effusions. No acute bony abnormality. Right PICC line is in place with the tip at the cavoatrial junction. IMPRESSION: Patchy bilateral airspace disease, left greater than right could reflect asymmetric edema or infection. Electronically Signed   By: Rolm Baptise M.D.   On: 09/17/2017 09:38   Dg Chest Port 1 View  Result Date: 09/12/2017 CLINICAL DATA:  83 year old female  with a history of fever EXAM: PORTABLE CHEST 1 VIEW COMPARISON:  09/04/2017 FINDINGS: Cardiomediastinal silhouette unchanged in size and contour. No evidence of central vascular congestion. No pneumothorax or pleural effusion. No confluent airspace disease. No acute displaced fracture. IMPRESSION: Negative for acute cardiopulmonary disease Electronically Signed   By: Corrie Mckusick D.O.   On: 09/12/2017 08:30   Dg Shoulder Right Port  Result Date: 09/20/2017 CLINICAL DATA:  Right shoulder pain with difficulty raising the arm. No known injury. EXAM: PORTABLE RIGHT SHOULDER COMPARISON:  Limited views of the right shoulder from a scout radiograph of the CT scan of the chest of September 19, 2017 FINDINGS: The bones are subjectively adequately mineralized. The glenohumeral joint space is grossly normal. There is moderate narrowing of the AC joint. The subacromial subdeltoid space is normal. There is a small amount of calcification in the region of the insertion of the rotator cuff on the greater tuberosity of the humerus. The observed portions of the right lung exhibit increased interstitial and patchy airspace opacities. IMPRESSION: Degenerative change of the right shoulder centered on the [facility] joint. No acute bony abnormality. Electronically Signed   By: David  Lewis M.D.   On: 09/20/2017 09:04   Ct Bone Marrow Biopsy & Aspiration  Result Date: 09/07/2017 INDICATION: Lymphoma versus leukemia, pancytopenia EXAM: CT GUIDED RIGHT ILIAC BONE MARROW ASPIRATION AND CORE BIOPSY Date:  09/07/2017 09/07/2017 12:00 pm Radiologist:  Jerilynn Mages. Daryll Brod, MD Guidance:  CT FLUOROSCOPY TIME:  Fluoroscopy Time: None. MEDICATIONS: 1% lidocaine local ANESTHESIA/SEDATION: 0.5 mg IV Versed; 25 mcg IV Fentanyl Moderate Sedation Time:  10 minutes The patient was continuously monitored during the procedure by the interventional radiology nurse under my direct supervision. CONTRAST:  None. COMPLICATIONS: None PROCEDURE: Informed consent was obtained  from the patient following explanation of the procedure, risks, benefits and alternatives. The patient understands, agrees and consents for the procedure. All questions were addressed. A time out was performed. The patient was positioned prone and non-contrast localization CT was performed of the pelvis to demonstrate the iliac marrow spaces. Maximal barrier sterile technique utilized including caps, mask, sterile gowns, sterile gloves, large sterile drape, hand hygiene, and Betadine  prep. Under sterile conditions and local anesthesia, an 11 gauge coaxial bone biopsy needle was advanced into the right iliac marrow space. Needle position was confirmed with CT imaging. Initially, bone marrow aspiration was performed. Next, the 11 gauge outer cannula was utilized to obtain a right iliac bone marrow core biopsy. Needle was removed. Hemostasis was obtained with compression. The patient tolerated the procedure well. Samples were prepared with the cytotechnologist. No immediate complications. IMPRESSION: CT guided right iliac bone marrow aspiration and core biopsy. Electronically Signed   By: Jerilynn Mages.  Shick M.D.   On: 09/07/2017 12:23   Ir Picc Placement Right >5 Yrs Inc Img Guide  Result Date: 09/14/2017 INDICATION: 83 year old female referred for PICC for AML. EXAM: PICC LINE PLACEMENT WITH ULTRASOUND AND FLUOROSCOPIC GUIDANCE MEDICATIONS: None ANESTHESIA/SEDATION: None FLUOROSCOPY TIME:  Fluoroscopy Time: 0 minutes 12 seconds (0.9 mGy). COMPLICATIONS: None PROCEDURE: Informed written consent was obtained from the patient after a thorough discussion of the procedural risks, benefits and alternatives. All questions were addressed. Maximal Sterile Barrier Technique was utilized including caps, mask, sterile gowns, sterile gloves, sterile drape, hand hygiene and skin antiseptic. A timeout was performed prior to the initiation of the procedure. Patient was position in the supine position on the fluoroscopy table with the  right arm abducted 90 degrees. Ultrasound survey of the upper extremity was performed with images stored and sent to PACs. The right basilic vein was selected for access. Once the patient was prepped and draped in the usual sterile fashion, the skin and subcutaneous tissues were generously infiltrated with 1% lidocaine for local anesthesia. A micropuncture access kit was then used to access the targeted vein. Wire was passed centrally, confirmed to be within the venous system under fluoroscopy. A small stab incision was made with an 11 blade scalpel and the sheath was then placed over the wire. Estimated length of the catheter was then performed with the indwelling wire. Catheter was amputated at 37 cm length and placed with coaxial wire through the peel-away. Double-lumen, power injectable PICC in the basilic vein. Tip confirmed at the cavoatrial junction, and the catheter is ready for use. Stat lock was placed. Patient tolerated the procedure well and remained hemodynamically stable throughout. No complications were encountered and no significant blood loss was encountered. IMPRESSION: Status post right upper extremity PICC.  Catheter ready for use. Signed, Dulcy Fanny. Dellia Nims, RPVI [facility] Electronically Signed   By: Corrie Mckusick D.O.   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 Palliative care  following. -Discussed pt labwork today, 7/1/9; HGB at 7.5, PLT at 18k  -Per Dr Jonette Eva -s/p Dacogen C1 (from 6/12). Due for C2 Dacogen on 7/11 if stable. -currently on Venetoclax 470m po daily. No evidence of TLS -anticipate the patient will remain transfusion dependent for the upcoming period. -will be following with Dr EJonette Evaon discharge. -PT/OT - to determine discharge needs.  6) Hypokalemia - K 2.9 -  mx per hospitalist  7) abnormal LFTs ?related to dacogen - improved -monitor   The total time spent in the appt was 35 minutes and more than 50% was on counseling and direct patient cares.   Sullivan Lone MD MS AAHIVMS [facility] Hematology/Oncology Physician [facility]  (Office):       [phone] (Work cell):  [phone] (Fax):           [phone]  10/03/2017 4:55 PM  I, Baldwin [place], am acting as a Education administrator for Dr Irene Limbo.   .I have reviewed the above documentation for accuracy and completeness, and I agree with the above. Sullivan Lone MD MS

## 2017-10-03 DEATH — deceased

## 2017-10-04 ENCOUNTER — Telehealth: Payer: Self-pay

## 2017-10-04 DIAGNOSIS — C9202 Acute myeloblastic leukemia, in relapse: Secondary | ICD-10-CM

## 2017-10-04 LAB — CBC WITH DIFFERENTIAL/PLATELET
BASOS ABS: 0 10*3/uL (ref 0.0–0.1)
Basophils Relative: 0 %
Eosinophils Absolute: 0 10*3/uL (ref 0.0–0.7)
Eosinophils Relative: 0 %
HEMATOCRIT: 22.8 % — AB (ref 36.0–46.0)
Hemoglobin: 7.1 g/dL — ABNORMAL LOW (ref 12.0–15.0)
LYMPHS ABS: 0.6 10*3/uL — AB (ref 0.7–4.0)
LYMPHS PCT: 19 %
MCH: 29.1 pg (ref 26.0–34.0)
MCHC: 31.1 g/dL (ref 30.0–36.0)
MCV: 93.4 fL (ref 78.0–100.0)
METAMYELOCYTES PCT: 2 %
MONOS PCT: 36 %
Monocytes Absolute: 1.1 10*3/uL — ABNORMAL HIGH (ref 0.1–1.0)
Myelocytes: 3 %
NEUTROS PCT: 40 %
Neutro Abs: 1.4 10*3/uL — ABNORMAL LOW (ref 1.7–7.7)
PLATELETS: 11 10*3/uL — AB (ref 150–400)
RBC: 2.44 MIL/uL — AB (ref 3.87–5.11)
RDW: 16.4 % — ABNORMAL HIGH (ref 11.5–15.5)
WBC: 3.1 10*3/uL — AB (ref 4.0–10.5)

## 2017-10-04 LAB — COMPREHENSIVE METABOLIC PANEL
ALT: 26 U/L (ref 0–44)
AST: 23 U/L (ref 15–41)
Albumin: 1.5 g/dL — ABNORMAL LOW (ref 3.5–5.0)
Alkaline Phosphatase: 59 U/L (ref 38–126)
Anion gap: 7 (ref 5–15)
BUN: 27 mg/dL — ABNORMAL HIGH (ref 8–23)
CHLORIDE: 108 mmol/L (ref 98–111)
CO2: 31 mmol/L (ref 22–32)
CREATININE: 0.75 mg/dL (ref 0.44–1.00)
Calcium: 7.1 mg/dL — ABNORMAL LOW (ref 8.9–10.3)
GFR calc non Af Amer: >60 mL/min (ref >60)
Glucose, Bld: 157 mg/dL — ABNORMAL HIGH (ref 70–99)
POTASSIUM: 3.2 mmol/L — AB (ref 3.5–5.1)
SODIUM: 146 mmol/L — AB (ref 135–145)
Total Bilirubin: 2.3 mg/dL — ABNORMAL HIGH (ref 0.3–1.2)
Total Protein: 5.2 g/dL — ABNORMAL LOW (ref 6.5–8.1)

## 2017-10-04 LAB — GLUCOSE, CAPILLARY
GLUCOSE-CAPILLARY: 374 mg/dL — AB (ref 70–99)
Glucose-Capillary: 163 mg/dL — ABNORMAL HIGH (ref 70–99)
Glucose-Capillary: 202 mg/dL — ABNORMAL HIGH (ref 70–99)
Glucose-Capillary: 210 mg/dL — ABNORMAL HIGH (ref 70–99)

## 2017-10-04 LAB — PHOSPHORUS: Phosphorus: 2.3 mg/dL — ABNORMAL LOW (ref 2.5–4.6)

## 2017-10-04 LAB — PREPARE RBC (CROSSMATCH)

## 2017-10-04 LAB — URIC ACID: URIC ACID, SERUM: 3.2 mg/dL (ref 2.5–7.1)

## 2017-10-04 LAB — LACTATE DEHYDROGENASE: LDH: 387 U/L — AB (ref 98–192)

## 2017-10-04 MED ORDER — SODIUM CHLORIDE 0.9% IV SOLUTION
Freq: Once | INTRAVENOUS | Status: AC
  Administered 2017-10-04: 21:00:00 via INTRAVENOUS

## 2017-10-04 MED ORDER — METOPROLOL TARTRATE 5 MG/5ML IV SOLN
5.0000 mg | Freq: Three times a day (TID) | INTRAVENOUS | Status: DC
  Administered 2017-10-04 (×2): 5 mg via INTRAVENOUS
  Filled 2017-10-04 (×2): qty 5

## 2017-10-04 NOTE — Progress Notes (Signed)
Marland Kitchen   HEMATOLOGY/ONCOLOGY INPATIENT PROGRESS NOTE  Date of Service: 10/04/2017  Inpatient Attending: .Mariel Aloe, MD  SUBJECTIVE:   Leah Lewis has been moved to the MICU for afib with RVR and hypotension. She is very irritated today and wants to be left alone. Very confused and only partially redirectable. Replies all questions with "I will be fine". She continue to have significant cytopenias and I called and tried to discuss goals of care with her daughter Leah Lewis on the phone given patients grave prognosis. She said she knew about this and that she wants to talk in person tomorrow.   OBJECTIVE:  PHYSICAL EXAMINATION: . Vitals:   10/04/17 0934 10/04/17 0941 10/04/17 1515 10/04/17 1632  BP: (!) 93/52 (!) 90/50 (!) 101/57 140/70  Pulse: (!) 120   (!) 135  Resp:   (!) 35 (!) 30  Temp:      TempSrc:      SpO2:    90%  Weight:      Height:       Filed Weights   10/01/17 1121 10/02/17 0400 10/03/17 0457  Weight: 137 lb (62.1 kg) 139 lb (63 kg) 144 lb (65.3 kg)   .Body mass index is 24.72 kg/m.  CVS S1S2 irreg tachy Lung - b/l scattered rales Neuro - alert, moving all 4 extremities Did not allow for other examination  MEDICAL HISTORY:   Past Medical History:  Diagnosis Date  . AML (acute myeloid leukemia) ([place]) 09/12/2017  . Atrial fibrillation ([place]) 09/15/2017  . Goals of care, counseling/discussion 09/12/2017  . High cholesterol   . Hypertension   . Stroke ([place])   . Thrush 09/22/2017    SURGICAL HISTORY: Past Surgical History:  Procedure Laterality Date  . ABDOMINAL HYSTERECTOMY    . CATARACT EXTRACTION     bilateral  . CESAREAN SECTION      SOCIAL HISTORY: Social History   Socioeconomic History  . Marital status: Single    Spouse name: Not on file  . Number of children: Not on file  . Years of education: Not on file  . Highest education level: Not on file  Occupational History  . Not on file  Social Needs  . Financial resource strain: Not  on file  . Food insecurity:    Worry: Not on file    Inability: Not on file  . Transportation needs:    Medical: Not on file    Non-medical: Not on file  Tobacco Use  . Smoking status: Never Smoker  . Smokeless tobacco: Former Systems developer    Types: Chew  Substance and Sexual Activity  . Alcohol use: No  . Drug use: No  . Sexual activity: Not on file  Lifestyle  . Physical activity:    Days per week: Not on file    Minutes per session: Not on file  . Stress: Not on file  Relationships  . Social connections:    Talks on phone: Not on file    Gets together: Not on file    Attends religious service: Not on file    Active member of club or organization: Not on file    Attends meetings of clubs or organizations: Not on file    Relationship status: Not on file  . Intimate partner violence:    Fear of current or ex partner: Not on file    Emotionally abused: Not on file    Physically abused: Not on file    Forced sexual activity: Not on file  Other Topics Concern  . Not on file  Social History Narrative  . Not on file    FAMILY HISTORY: Family History  Problem Relation Age of Onset  . Heart attack Mother   . Pneumonia Father   . Parkinsonism Father   . Breast cancer Sister     ALLERGIES:  is allergic to percocet [oxycodone-acetaminophen].  MEDICATIONS:  Scheduled Meds: . sodium chloride   Intravenous Once  . collagenase   Topical Daily  . digoxin  0.25 mg Intravenous Once  . feeding supplement (ENSURE ENLIVE)  237 mL Oral TID BM  . insulin aspart  0-9 Units Subcutaneous Q6H  . ketorolac  1 drop Both Eyes QID  . lidocaine  1 patch Transdermal QAC breakfast  . mouth rinse  15 mL Mouth Rinse BID  . metoprolol tartrate  5 mg Intravenous Q8H  . mirtazapine  15 mg Oral QHS  . multivitamin with minerals  1 tablet Oral Daily  . nystatin  5 mL Oral Q8H  . polyethylene glycol  17 g Oral BID  . potassium chloride  40 mEq Oral Once  . prednisoLONE acetate  1 drop Both Eyes QID    . sodium chloride flush  10-40 mL Intracatheter Q12H  . venetoclax  400 mg Oral Daily   Continuous Infusions: . 0.45 % NaCl with KCl 20 mEq / L 75 mL/hr at 10/04/17 0925   PRN Meds:.acetaminophen **OR** acetaminophen, alteplase, heparin lock flush, heparin lock flush, heparin lock flush, heparin lock flush, HYDROcodone-acetaminophen, labetalol, ondansetron (ZOFRAN) IV, sodium chloride flush, sodium chloride flush, sodium chloride flush  REVIEW OF SYSTEMS:    A 10+ POINT REVIEW OF SYSTEMS WAS OBTAINED including neurology, dermatology, psychiatry, cardiac, respiratory, lymph, extremities, GI, GU, Musculoskeletal, constitutional, breasts, reproductive, HEENT.  All pertinent positives are noted in the HPI.  All others are negative.   LABORATORY DATA:  I have reviewed the data as listed  . CBC Latest Ref Rng & Units 10/04/2017 10/03/2017 10/02/2017  WBC 4.0 - 10.5 K/uL 3.1(L) 3.3(L) 5.8  Hemoglobin 12.0 - 15.0 g/dL 7.1(L) 7.5(L) 10.2(L)  Hematocrit 36.0 - 46.0 % 22.8(L) 23.3(L) 31.8(L)  Platelets 150 - 400 K/uL 11(LL) 18(LL) 6(LL)    CMP Latest Ref Rng & Units 10/04/2017 10/03/2017 10/02/2017  Glucose 70 - 99 mg/dL 157(H) 217(H) 242(H)  BUN 8 - 23 mg/dL 27(H) 29(H) 29(H)  Creatinine 0.44 - 1.00 mg/dL 0.75 0.75 0.78  Sodium 135 - 145 mmol/L 146(H) 145 147(H)  Potassium 3.5 - 5.1 mmol/L 3.2(L) 2.9(L) 2.9(L)  Chloride 98 - 111 mmol/L 108 104 107  CO2 22 - 32 mmol/L 31 32 31  Calcium 8.9 - 10.3 mg/dL 7.1(L) 7.1(L) 7.1(L)  Total Protein 6.5 - 8.1 g/dL 5.2(L) 5.3(L) -  Total Bilirubin 0.3 - 1.2 mg/dL 2.3(H) 2.3(H) -  Alkaline Phos 38 - 126 U/L 59 69 -  AST 15 - 41 U/L 23 28 -  ALT 0 - 44 U/L 26 29 -     RADIOGRAPHIC STUDIES: I have personally reviewed the radiological images as listed and agreed with the findings in the report. Dg Chest 1 View  Result Date: 09/25/2017 CLINICAL DATA:  Fever. EXAM: CHEST  1 VIEW COMPARISON:  09/21/2017. FINDINGS: Stable enlarged cardiac silhouette. Stable  patchy opacity in the left mid lung zone with mildly increased patchy opacity in the left lower lung zone. Interval small amount of patchy opacity beneath the minor fissure on the right. Stable mildly prominent interstitial markings. Thoracic spine degenerative changes. Right  PICC tip in the inferior aspect of the superior vena cava approximately 1.5 cm above the superior cavoatrial junction. IMPRESSION: 1. Mildly increased left lung pneumonia. 2. Interval small amount of probable pneumonia in the right mid to lower lung zone, possibly in the right middle lobe. 3. Stable cardiomegaly and mild chronic interstitial lung disease. Electronically Signed   By: Claudie Revering M.D.   On: 09/25/2017 21:02   Dg Chest 2 View  Result Date: 09/22/2017 CLINICAL DATA:  Generalized weakness EXAM: CHEST - 2 VIEW COMPARISON:  03/02/2015 FINDINGS: The lungs are well inflated. The cardiomediastinal contours are normal. Hazy opacities in the right lung base are unchanged. There is no pleural effusion or pneumothorax. IMPRESSION: No active cardiopulmonary disease. Electronically Signed   By: Ulyses Jarred M.D.   On: 10/01/2017 15:25   Ct Chest Wo Contrast  Result Date: 09/19/2017 CLINICAL DATA:  Generalized weakness and fatigue. Fever, anemia. AML. EXAM: CT CHEST WITHOUT CONTRAST TECHNIQUE: Multidetector CT imaging of the chest was performed following the standard protocol without IV contrast. COMPARISON:  None. FINDINGS: Cardiovascular: Right PICC tip terminates junction at the SVC. Atherosclerotic calcification of the arterial vasculature, including coronary arteries. Pulmonary arteries and heart are enlarged. No pericardial effusion. Mediastinum/Nodes: Thyroid is heterogeneous. Mediastinal lymph nodes measure up to 10 mm in the low right paratracheal station. Hilar regions are difficult to evaluate without IV contrast. Subpectoral and axillary lymph nodes are not enlarged by CT size criteria. Esophagus is grossly unremarkable.  Lungs/Pleura: Image quality is rather degraded by respiratory motion. There is perihilar peribronchovascular ground-glass and consolidation with peripheral sparing. Septal thickening is not an overwhelming feature. No pleural fluid. Airway is unremarkable. Upper Abdomen: Visualized portions of the liver, adrenal glands, kidneys, spleen, pancreas, stomach and bowel are grossly unremarkable. Musculoskeletal: Degenerative changes in the spine. No worrisome lytic or sclerotic lesions. IMPRESSION: 1. Perihilar peribronchovascular ground-glass and consolidation with peripheral sparing. Findings may be due to leukostasis related to AML, pulmonary hemorrhage or pulmonary edema. 2. Borderline enlarged mediastinal lymph nodes. 3. Aortic atherosclerosis (ICD10-170.0). Coronary artery calcification. 4. Enlarged pulmonary arteries, indicative of pulmonary arterial hypertension. Electronically Signed   By: Lorin Picket M.D.   On: 09/19/2017 16:56   Mr Sacrum Si Joints Wo Contrast  Result Date: 09/13/2017 CLINICAL DATA:  Osteomyelitis of the sacrum suspected. Possible history of lymphoma or leukemia. Pancytopenia. EXAM: MRI PELVIS WITHOUT CONTRAST TECHNIQUE: Multiplanar multisequence MR imaging of the pelvis was performed. No intravenous contrast was administered. COMPARISON:  04/26/2007 pelvis MRI FINDINGS: Urinary Tract: Urinary bladder is unremarkable. No evidence of distal hydroureter. Bowel:  Unremarkable visualized pelvic bowel loops. Vascular/Lymphatic: No pathologically enlarged lymph nodes. No significant vascular abnormality seen. Reproductive:  Hysterectomy.  No adnexal mass. Other:  No free fluid. Musculoskeletal: Patchy heterogeneous marrow pattern of the bony pelvis and sacrum. Slight marrow edema of the right iliac bone posteriorly may reflect stigmata of recent marrow biopsy. Redemonstration of degenerative joint space narrowing of both hips minimal spurring at the femoral head-neck junction bilaterally.  There is lower lumbar degenerative disc and facet arthropathy of the included L4-5 and L5-S1 levels. Sacroiliac joints are maintained bilaterally. Pubic symphysis is intact. No marrow signal abnormality suspicious for acute fracture, frank bone destruction or osteomyelitis. Edema of the left obturator internus and pectineus muscles compatible with muscle strain. IMPRESSION: 1. New heterogeneous ill-defined marrow pattern of the included pelvis and sacrum that may reflect and infiltrative marrow abnormality such as leukemia or lymphoma. 2. Mild marrow edema involving the right iliac  bone would be in keeping with the patient's recent bone marrow biopsy site. 3. No conclusive evidence for osteomyelitis. 4. Left pectineus and obturator internus muscle strains. Electronically Signed   By: Ashley Royalty M.D.   On: 09/13/2017 19:02   Ct Biopsy  Result Date: 09/07/2017 INDICATION: Lymphoma versus leukemia, pancytopenia EXAM: CT GUIDED RIGHT ILIAC BONE MARROW ASPIRATION AND CORE BIOPSY Date:  09/07/2017 09/07/2017 12:00 pm Radiologist:  Jerilynn Mages. Daryll Brod, MD Guidance:  CT FLUOROSCOPY TIME:  Fluoroscopy Time: None. MEDICATIONS: 1% lidocaine local ANESTHESIA/SEDATION: 0.5 mg IV Versed; 25 mcg IV Fentanyl Moderate Sedation Time:  10 minutes The patient was continuously monitored during the procedure by the interventional radiology nurse under my direct supervision. CONTRAST:  None. COMPLICATIONS: None PROCEDURE: Informed consent was obtained from the patient following explanation of the procedure, risks, benefits and alternatives. The patient understands, agrees and consents for the procedure. All questions were addressed. A time out was performed. The patient was positioned prone and non-contrast localization CT was performed of the pelvis to demonstrate the iliac marrow spaces. Maximal barrier sterile technique utilized including caps, mask, sterile gowns, sterile gloves, large sterile drape, hand hygiene, and Betadine prep. Under  sterile conditions and local anesthesia, an 11 gauge coaxial bone biopsy needle was advanced into the right iliac marrow space. Needle position was confirmed with CT imaging. Initially, bone marrow aspiration was performed. Next, the 11 gauge outer cannula was utilized to obtain a right iliac bone marrow core biopsy. Needle was removed. Hemostasis was obtained with compression. The patient tolerated the procedure well. Samples were prepared with the cytotechnologist. No immediate complications. IMPRESSION: CT guided right iliac bone marrow aspiration and core biopsy. Electronically Signed   By: Jerilynn Mages.  Shick M.D.   On: 09/07/2017 12:23   Dg Chest Port 1 View  Result Date: 10/03/2017 CLINICAL DATA:  Acute respiratory failure EXAM: PORTABLE CHEST 1 VIEW COMPARISON:  09/28/2017 FINDINGS: The cardiac shadow is again enlarged. Right-sided PICC line is again seen. Patchy infiltrate is noted in the left upper lung but improved from the prior exam. The right lung is now clear. No bony abnormality is noted. IMPRESSION: Improving infiltrates with mild residual on the left. Electronically Signed   By: Inez Catalina M.D.   On: 10/03/2017 14:00   Dg Chest Port 1 View  Result Date: 09/28/2017 CLINICAL DATA:  Follow-up pneumonia EXAM: PORTABLE CHEST 1 VIEW COMPARISON:  09/25/2017 FINDINGS: Cardiac shadow remains enlarged. Lungs are well aerated bilaterally. Persistent left-sided infiltrate and mild right basilar infiltrate is noted. No significant interval change is noted. No effusion is seen. Degenerative changes of the thoracic spine are noted. Right-sided PICC line is again noted and stable. IMPRESSION: Stable bilateral infiltrates left greater than right. Electronically Signed   By: Inez Catalina M.D.   On: 09/28/2017 09:22   Dg Chest Port 1 View  Result Date: 09/21/2017 CLINICAL DATA:  Shortness of breath and fever. Acute myeloid leukemia, atrial fibrillation. EXAM: PORTABLE CHEST 1 VIEW COMPARISON:  Chest x-ray of September 17, 2017 and chest CT scan of September 19, 2017. FINDINGS: The lungs are adequately inflated. The new confluent airspace opacities are present greatest on the left. There is no pleural effusion or pneumothorax. The heart is enlarged. The pulmonary vascularity is indistinct but definite cephalization is not observed. There is calcification in the wall of the thoracic aorta. The right-sided PICC line tip projects over the midportion of the SVC. IMPRESSION: Interval development of airspace opacities greatest on the left most compatible  with pneumonia. Underlying low-grade CHF with stable cardiomegaly. Thoracic aortic atherosclerosis. Electronically Signed   By: David  Lewis M.D.   On: 09/21/2017 08:20   Dg Chest Port 1 View  Result Date: 09/17/2017 CLINICAL DATA:  Fever EXAM: PORTABLE CHEST 1 VIEW COMPARISON:  09/12/2017 FINDINGS: There is cardiomegaly. Vascular congestion. Bilateral patchy airspace opacities, left greater than right. This could reflect asymmetric edema or infection. No effusions. No acute bony abnormality. Right PICC line is in place with the tip at the cavoatrial junction. IMPRESSION: Patchy bilateral airspace disease, left greater than right could reflect asymmetric edema or infection. Electronically Signed   By: Rolm Baptise M.D.   On: 09/17/2017 09:38   Dg Chest Port 1 View  Result Date: 09/12/2017 CLINICAL DATA:  83 year old female with a history of fever EXAM: PORTABLE CHEST 1 VIEW COMPARISON:  09/27/2017 FINDINGS: Cardiomediastinal silhouette unchanged in size and contour. No evidence of central vascular congestion. No pneumothorax or pleural effusion. No confluent airspace disease. No acute displaced fracture. IMPRESSION: Negative for acute cardiopulmonary disease Electronically Signed   By: Corrie Mckusick D.O.   On: 09/12/2017 08:30   Dg Shoulder Right Port  Result Date: 09/20/2017 CLINICAL DATA:  Right shoulder pain with difficulty raising the arm. No known injury. EXAM: PORTABLE  RIGHT SHOULDER COMPARISON:  Limited views of the right shoulder from a scout radiograph of the CT scan of the chest of September 19, 2017 FINDINGS: The bones are subjectively adequately mineralized. The glenohumeral joint space is grossly normal. There is moderate narrowing of the AC joint. The subacromial subdeltoid space is normal. There is a small amount of calcification in the region of the insertion of the rotator cuff on the greater tuberosity of the humerus. The observed portions of the right lung exhibit increased interstitial and patchy airspace opacities. IMPRESSION: Degenerative change of the right shoulder centered on the [facility] joint. No acute bony abnormality. Electronically Signed   By: David  Lewis M.D.   On: 09/20/2017 09:04   Ct Bone Marrow Biopsy & Aspiration  Result Date: 09/07/2017 INDICATION: Lymphoma versus leukemia, pancytopenia EXAM: CT GUIDED RIGHT ILIAC BONE MARROW ASPIRATION AND CORE BIOPSY Date:  09/07/2017 09/07/2017 12:00 pm Radiologist:  Jerilynn Mages. Daryll Brod, MD Guidance:  CT FLUOROSCOPY TIME:  Fluoroscopy Time: None. MEDICATIONS: 1% lidocaine local ANESTHESIA/SEDATION: 0.5 mg IV Versed; 25 mcg IV Fentanyl Moderate Sedation Time:  10 minutes The patient was continuously monitored during the procedure by the interventional radiology nurse under my direct supervision. CONTRAST:  None. COMPLICATIONS: None PROCEDURE: Informed consent was obtained from the patient following explanation of the procedure, risks, benefits and alternatives. The patient understands, agrees and consents for the procedure. All questions were addressed. A time out was performed. The patient was positioned prone and non-contrast localization CT was performed of the pelvis to demonstrate the iliac marrow spaces. Maximal barrier sterile technique utilized including caps, mask, sterile gowns, sterile gloves, large sterile drape, hand hygiene, and Betadine prep. Under sterile conditions and local anesthesia, an 11 gauge coaxial bone  biopsy needle was advanced into the right iliac marrow space. Needle position was confirmed with CT imaging. Initially, bone marrow aspiration was performed. Next, the 11 gauge outer cannula was utilized to obtain a right iliac bone marrow core biopsy. Needle was removed. Hemostasis was obtained with compression. The patient tolerated the procedure well. Samples were prepared with the cytotechnologist. No immediate complications. IMPRESSION: CT guided right iliac bone marrow aspiration and core biopsy. Electronically Signed   By: Jerilynn Mages.  Shick  M.D.   On: 09/07/2017 12:23   Ir Picc Placement Right >5 Yrs Inc Img Guide  Result Date: 09/14/2017 INDICATION: 83 year old female referred for PICC for AML. EXAM: PICC LINE PLACEMENT WITH ULTRASOUND AND FLUOROSCOPIC GUIDANCE MEDICATIONS: None ANESTHESIA/SEDATION: None FLUOROSCOPY TIME:  Fluoroscopy Time: 0 minutes 12 seconds (0.9 mGy). COMPLICATIONS: None PROCEDURE: Informed written consent was obtained from the patient after a thorough discussion of the procedural risks, benefits and alternatives. All questions were addressed. Maximal Sterile Barrier Technique was utilized including caps, mask, sterile gowns, sterile gloves, sterile drape, hand hygiene and skin antiseptic. A timeout was performed prior to the initiation of the procedure. Patient was position in the supine position on the fluoroscopy table with the right arm abducted 90 degrees. Ultrasound survey of the upper extremity was performed with images stored and sent to PACs. The right basilic vein was selected for access. Once the patient was prepped and draped in the usual sterile fashion, the skin and subcutaneous tissues were generously infiltrated with 1% lidocaine for local anesthesia. A micropuncture access kit was then used to access the targeted vein. Wire was passed centrally, confirmed to be within the venous system under fluoroscopy. A small stab incision was made with an 11 blade scalpel and the sheath  was then placed over the wire. Estimated length of the catheter was then performed with the indwelling wire. Catheter was amputated at 37 cm length and placed with coaxial wire through the peel-away. Double-lumen, power injectable PICC in the basilic vein. Tip confirmed at the cavoatrial junction, and the catheter is ready for use. Stat lock was placed. Patient tolerated the procedure well and remained hemodynamically stable throughout. No complications were encountered and no significant blood loss was encountered. IMPRESSION: Status post right upper extremity PICC.  Catheter ready for use. Signed, Dulcy Fanny. Dellia Nims, RPVI [facility] Electronically Signed   By: Corrie Mckusick D.O.   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 -overall grave prognosis. Palliative care following. Talked to daughter Leah Lewis on the phone today. She will be  -ongoing goals of care discussion -- no clear disposition plan with current treatment -s/p Dacogen C1 (from 6/12). Due for C2 Dacogen on 7/11 if stable and based on goals of cares. -currently on Venetoclax '400mg'$  po daily. No evidence of TLS -anticipate the patient will remain transfusion dependent for the upcoming period. -will be following with Dr Jonette Eva on discharge. -PT/OT - to  determine discharge needs.  6) Hypokalemia - K 3.1-  mx per hospitalist - replace K+ and magnesium as needed to maintain K>4 and Magnesium >2  . The total time spent in the appointment was 25 minutes and more than 50% was on counseling and direct patient cares.     Sullivan Lone MD [place] AAHIVMS [facility] Hematology/Oncology Physician [facility]  (Office):       [phone] (Work  cell):  [phone] (Fax):           [phone]  10/04/2017 5:44 PM

## 2017-10-04 NOTE — Progress Notes (Addendum)
Progress Note  Patient Name: Leah Lewis Date of Encounter: 10/04/2017  Primary Cardiologist: Seen by Dr. Rayann Heman and Dr. Meda Coffee earlier this admission.  Subjective   Cardiology asked to re-sign on for afib RVR. Complex hospital course as below.  Patient denies any acute complaints, lying flat in bed, getting washed up by nursing staff. Denies CP or SOB. Not a great historian... Struggled to name where she even is. When asked her name, she gave me her birth date.    Inpatient Medications    Scheduled Meds: . collagenase   Topical Daily  . digoxin  0.25 mg Intravenous Once  . feeding supplement (ENSURE ENLIVE)  237 mL Oral TID BM  . insulin aspart  0-9 Units Subcutaneous Q6H  . ketorolac  1 drop Both Eyes QID  . lidocaine  1 patch Transdermal QAC breakfast  . mouth rinse  15 mL Mouth Rinse BID  . metoprolol tartrate  75 mg Oral BID  . mirtazapine  15 mg Oral QHS  . multivitamin with minerals  1 tablet Oral Daily  . nystatin  5 mL Oral Q8H  . polyethylene glycol  17 g Oral BID  . potassium chloride  40 mEq Oral Once  . prednisoLONE acetate  1 drop Both Eyes QID  . sodium chloride flush  10-40 mL Intracatheter Q12H  . venetoclax  400 mg Oral Daily   Continuous Infusions: . 0.45 % NaCl with KCl 20 mEq / L 75 mL/hr at 10/04/17 0925   PRN Meds: acetaminophen **OR** acetaminophen, alteplase, heparin lock flush, heparin lock flush, heparin lock flush, heparin lock flush, HYDROcodone-acetaminophen, labetalol, ondansetron (ZOFRAN) IV, sodium chloride flush, sodium chloride flush, sodium chloride flush   Vital Signs    Vitals:   10/04/17 0511 10/04/17 0927 10/04/17 0934 10/04/17 0941  BP: (!) 88/51 (!) 89/56 (!) 93/52 (!) 90/50  Pulse: (!) 116 (!) 132 (!) 120   Resp: 16     Temp: 99.7 F (37.6 C)     TempSrc: Oral     SpO2: 90%     Weight:      Height:        Intake/Output Summary (Last 24 hours) at 10/04/2017 1107 Last data filed at 10/04/2017 3151 Gross per 24 hour    Intake 1930 ml  Output 775 ml  Net 1155 ml   Filed Weights   10/01/17 1121 10/02/17 0400 10/03/17 0457  Weight: 137 lb (62.1 kg) 139 lb (63 kg) 144 lb (65.3 kg)    Telemetry    Atrial fib RVR 110-130s - Personally Reviewed  Physical Exam   GEN: No acute distress, cachectic appearing AAF HEENT: Normocephalic, atraumatic, sclera non-icteric. Neck: No JVD or bruits. Cardiac: Irregularly irregular, rapid, no murmurs, rubs, or gallops.  Radials/DP/PT 1+ and equal bilaterally.  Respiratory: Clear to auscultation bilaterally. Breathing is unlabored. GI: Soft, nontender, non-distended, BS +x 4. MS: generalized atrophy Extremities: No clubbing or cyanosis. No edema. Distal pedal pulses are 2+ and equal bilaterally. Neuro:  tells me birthdate when I ask name. Struggles to name where we are, only able to denote "hospital" when offered various choices. Follows commands. Psych:  Pleasant affect.  Labs    Chemistry Recent Labs  Lab 10/02/17 0550 10/02/17 1944 10/03/17 0555 10/04/17 0427  NA 148* 147* 145 146*  K 3.3* 2.9* 2.9* 3.2*  CL 105 107 104 108  CO2 31 31 32 31  GLUCOSE 244* 242* 217* 157*  BUN 27* 29* 29* 27*  CREATININE 0.81  0.78 0.75 0.75  CALCIUM 7.7* 7.1* 7.1* 7.1*  PROT 6.2*  --  5.3* 5.2*  ALBUMIN 1.7*  --  1.5* 1.5*  AST 43*  --  28 23  ALT 38  --  29 26  ALKPHOS 83  --  69 59  BILITOT 3.2*  --  2.3* 2.3*  GFRNONAA >60 >60 >60 >60  GFRAA >60 >60 >60 >60  ANIONGAP 12 9 9 7      Hematology Recent Labs  Lab 10/02/17 0550 10/03/17 0555 10/04/17 0427  WBC 5.8 3.3* 3.1*  RBC 3.40* 2.47* 2.44*  HGB 10.2* 7.5* 7.1*  HCT 31.8* 23.3* 22.8*  MCV 93.5 94.3 93.4  MCH 30.0 30.4 29.1  MCHC 32.1 32.2 31.1  RDW 16.9* 16.7* 16.4*  PLT 6* 18* 11*    Cardiac EnzymesNo results for input(s): TROPONINI in the last 168 hours. No results for input(s): TROPIPOC in the last 168 hours.   BNPNo results for input(s): BNP, PROBNP in the last 168 hours.   DDimer No  results for input(s): DDIMER in the last 168 hours.   Radiology    Dg Chest Port 1 View  Result Date: 10/03/2017 CLINICAL DATA:  Acute respiratory failure EXAM: PORTABLE CHEST 1 VIEW COMPARISON:  09/28/2017 FINDINGS: The cardiac shadow is again enlarged. Right-sided PICC line is again seen. Patchy infiltrate is noted in the left upper lung but improved from the prior exam. The right lung is now clear. No bony abnormality is noted. IMPRESSION: Improving infiltrates with mild residual on the left. Electronically Signed   By: Inez Catalina M.D.   On: 10/03/2017 14:00    Cardiac Studies   2d echo 09/07/17 Study Conclusions  - Left ventricle: The cavity size was normal. Wall thickness was   normal. Systolic function was normal. The estimated ejection   fraction was in the range of 60% to 65%. Wall motion was normal;   there were no regional wall motion abnormalities. Doppler   parameters are consistent with abnormal left ventricular   relaxation (grade 1 diastolic dysfunction). The E/e&' ratio is <8,   suggesting normal LV filling pressure. - Aortic valve: Trileaflet. Sclerosis without stenosis.   Transvalvular velocity was minimally increased. There was mild   regurgitation. - Aorta: Ascending aortic diameter: 39 mm (S). - Ascending aorta: The ascending aorta was mildly dilated. - Mitral valve: Mildly thickened leaflets . There was trivial   regurgitation. - Left atrium: The atrium was normal in size. - Right atrium: Mildly dilated. - Tricuspid valve: There was mild regurgitation. - Pulmonary arteries: PA peak pressure: 32 mm Hg (S). - Inferior vena cava: The vessel was normal in size. The   respirophasic diameter changes were in the normal range (>= 50%),   consistent with normal central venous pressure. Impressions: - LVEF 60-65%, normal wall thickness, normal wall motion, grade 1   DD, normal LV filling pressure, aortic valve sclerosis with mild   AI, dilated ascending aorta to  3.9 cm, trivial MR, normal LA   size, mild RAE, mild TR, RVSP 32 mmHg, normal IVC.  Patient Profile     83 y.o. female with HTN, HLD, prior stroke, prior DVT on Xarelto who presented [facility] with weakness, fatigue, weight loss. Found to have significant pancytopenia (including severe thrombocytopenia -> diagnosed with AML with grave prognosis per oncology notes. Started chemo this admission and received numerous transfusions of PRBC and platelets. Other issues complicating this admission include acute respiratory failure with hypoxia in the setting  of RML pneumonia, unstageable sacral pressure ulcer (unable to do hydrotherapy 2/2 thrombocytopenia), malnutrition, oral thrush. Nutrition has raised question of PEG tube if within [place] given inadequate PO intake. Cardiology saw earlier this admission for atrial fibrillation - was treated with cardizem which was transitioned to metoprolol given interaction with chemotherapy. 2D echo 09/07/17 EF 60-65%, grade 1 DD , mild AI, mildly dilated ascending aorta, mild TR, normal LA size. Per palliative care notes, patient with evidence of dementia, and per nurse patient refusing and pocketing pills at times.   Labs today indicate hypernatremia, hypokalemia, elevated BUN, severe hypoalbuminemia of 1.5, worsening pancytopenia again with WBC 3.1, Hgb 7.1, and platelet 11k. LDH is 387.  Assessment & Plan    1. Severe medical illness as above with AML with grave prognosis - worsening pancytopenia noted today. Palliative medicine following. Nurse tells me that IM has requested oncology come back on board to help clarify goals of care. She says daughter seems under the impression from initial conversation with Dr. Marin Olp that leukemia was caught early and there was hope, but most recent notes indicate grave prognosis. The patient is on a type of chemotherapy that can interact with many of our cardiac meds but even further, the patient has been refusing this the last 3  days.  Therefore plan of treatment not really clear.  2. Persistent atrial fibrillation with RVR - given progression in HR, decrease in BP, and rise in temp I would be concerned for SIRS process/early sepsis. Afib is likely reflective of the above and in advanced malignancies can become difficult to control despite best efforts. I'm not sure that tight control of arrhythmia can be achieved until acute issues settle down, but will review options for management with Dr. Marlou Porch. Unfortunately even options like amiodarone and digoxin have been shown to interact with her chemo (but as above, the patient has not even been accepting this the last few days).  3. Hypotension - was hyoptensive this admission even when HR was controlled so does not seem acutely related to afib. Vitals show low grade fever so will defer further management to primary team. May need re-eval for infection.  4. Hypokalemia - management per primary team. Can consider eval of mag.  For questions or updates, please contact [place] Please consult [email or web address] for contact info under Cardiology/STEMI.  Signed, Charlie Pitter, PA-C 10/04/2017, 11:07 AM    Personally seen and examined. Agree with above.  When I interviewed the patient in the room, that she was over her head, she answers questions however, pleasant.  Denies chest pain, shortness of breath.  GEN: Ill-appearing, in no acute distress  HEENT: normal  Neck: no JVD, carotid bruits, or masses Cardiac: Irregular, mildly tachycardic; no murmurs, rubs, or gallops,no edema  Respiratory:  clear to auscultation bilaterally, normal work of breathing GI: soft, nontender, nondistended, + BS MS: no deformity or atrophy  Skin: warm and dry, no rash Neuro:  Alert grossly weakened Psych: Pleasant  Telemetry reviewed- atrial fibrillation currently 100-110, as high as 130s.  Personally reviewed  Assessment and plan:  Persistent atrial fibrillation in the setting of terminal  cancer - Grave prognosis.  She appears comfortable.  Her heart rate does not seem to be affecting her overall comfort.  I would not pursue digoxin or amiodarone given her potential interactions with perhaps ongoing chemotherapy although she has stated that she did not want this to be done over the past 3 days apparently. - Continue with current management with  palliative measures. - Continue her metoprolol 75 mg twice a day.  She did not receive early this morning likely because of relative hypotension.  Try to keep potassium greater than 4.  No further recommendations.  Unfortunately, this will be an ongoing issue for her but we must take into account her overall general medical state.  EF is normal.  She is unable to take anticoagulation because of severe pancytopenia.  [facility] will sign off.   Medication Recommendations: Continue with metoprolol Other recommendations (labs, testing, etc): None Follow up as an outpatient: None  Candee Furbish, MD

## 2017-10-04 NOTE — Progress Notes (Signed)
PROGRESS NOTE    Leah Lewis  WLN:[id number] DOB: 05/08/1934 DOA: 09/16/2017 PCP: Wenda Low, MD   Brief Narrative: Leah Lewis is a 83 y.o. female with past medical history of osteoporosis, hyperlipidemia, remote DVT on Xarelto. Patient presented secondary to generalized weakness and found to be pancytopenic secondary to AML. She has received multiple PRBC and platelet transfusions. She also developed atrial fibrillation with RVR which is currently controled. She has now been started on chemotherapy. She reverted back to Afrib with RVR in setting of having to stop cardizem secondary to chemotherapy interaction.   Assessment & Plan:   Active Problems:   Pressure injury of skin   AML (acute myeloid leukemia) (HCC)   Goals of care, counseling/discussion   AML (acute myeloblastic leukemia) ([place])   Advance care planning   Palliative care by specialist   Fever   Pancytopenia ([place])   Atrial fibrillation with RVR ([place])   Thrush   Thrombocytopenia ([place])   Advanced care planning/counseling discussion   AML Patient started on oral chemotherapy 6/24. Afebrile. Grave prognosis per medical oncology. She has been transfused a total of 7 units PRBC and 11 units platelets. Platelets improved after transfusion again -Medical oncology recommendations: continue venetoclax, transfuse for platelets <10k -Daily labs (BMP, CBC, LDH, FOBT, phosphorus, uric acid) -Oncology to follow-up with patient/family today  Atrial fibrillation with RVR Rate better controlled today. BP stable. Not on anticoagulation secondary to thrombocytopenia. Was treated with Cardizem and transitioned to metoprolol secondary to drug interaction with chemotherapy. Reverted back to RVR with associated hypotension. -Cardiology consult -Transfer to stepdown  Right middle lobe pneumonia Started on antibiotics. Stable. Completed cefepime course.  Essential hypertension Stable. Hypotensive today. On metoprolol. -Per  Cardiology  Unstageable sacral pressure ulcer Wound care recommended hydrotherapy. Unable to perform hydrotherapy secondary to recurrent significant thrombocytopenia  Malnutrition, unknown severity Per dietitian notes, patient may benefit from feeding tube. Recurrent thrombocytopenia makes it unsafe to place a feeding tube currently.  Oral intake has improved. -Continue oral nutrition  Oral thrush Treated with fluconazole  Hypokalemia -Supplementation and repeat BMP  Acute respiratory failure with hypoxia In setting of pneumonia. Pneumonia treated. Rales on exam. Infiltrates improved on repeat chest x-ray.  Elevated temperature No true fever, but Tmax of 100.2. No neutropenia. Associated tachycardia. Patient with recent history of pneumonia but treated. No other source concern for infection.  Diabetes mellitus -SSI   DVT prophylaxis: SCDs Code Status:   Code Status: Full Code Family Communication: Daughter at bedside Disposition Plan: Discharge to SNF when medically stable   Consultants:   Medical oncology  Palliative care medicine  Procedures:   6/3: 3 units PRBC  6/10: 2 units platelets  6/14: 2 units platelets  6/19: 2 units PRBC and 2 units platelets  6/22: 1 unit plalets  6/25: 2 units PRBC and 2 units platelets  6/28: 1 unit platelets  6/30: 1 unit platelets  Antimicrobials:  Vancomycin  Meropenem  Fluconazole  Cefepime    Subjective: No chest pain or dyspnea.  Objective: Vitals:   10/04/17 0511 10/04/17 0927 10/04/17 0934 10/04/17 0941  BP: (!) 88/51 (!) 89/56 (!) 93/52 (!) 90/50  Pulse: (!) 116 (!) 132 (!) 120   Resp: 16     Temp: 99.7 F (37.6 C)     TempSrc: Oral     SpO2: 90%     Weight:      Height:        Intake/Output Summary (Last 24 hours) at 10/04/2017 1254  Last data filed at 10/04/2017 1610 Gross per 24 hour  Intake 1930 ml  Output 775 ml  Net 1155 ml   Filed Weights   10/01/17 1121 10/02/17 0400 10/03/17 0457    Weight: 62.1 kg (137 lb) 63 kg (139 lb) 65.3 kg (144 lb)    Examination:  General exam: Appears calm and comfortable Respiratory system: Rales on left side. Respiratory effort normal. Cardiovascular system: S1 & S2 heard, RRR. No murmurs, rubs, gallops or clicks. Gastrointestinal system: Abdomen is nondistended, soft and nontender. Normal bowel sounds heard. Central nervous system: Alert and oriented. No focal neurological deficits. Extremities: No edema. No calf tenderness Skin: No cyanosis. No rashes Psychiatry: Judgement and insight appear normal. Mood & affect appropriate.    Data Reviewed: I have personally reviewed following labs and imaging studies  CBC: Recent Labs  Lab 09/30/17 0540 10/01/17 0428 10/02/17 0550 10/03/17 0555 10/04/17 0427  WBC 4.8 5.4 5.8 3.3* 3.1*  NEUTROABS 2.3 2.3 2.7 1.4* 1.4*  HGB 9.7* 9.1* 10.2* 7.5* 7.1*  HCT 29.6* 27.8* 31.8* 23.3* 22.8*  MCV 91.6 93.3 93.5 94.3 93.4  PLT 7* 18* 6* 18* 11*   Basic Metabolic Panel: Recent Labs  Lab 10/01/17 0428 10/01/17 1931 10/02/17 0550 10/02/17 1944 10/03/17 0555 10/04/17 0427  NA 148* 145 148* 147* 145 146*  K 2.9* 3.0* 3.3* 2.9* 2.9* 3.2*  CL 108 105 105 107 104 108  CO2 31 31 31 31  32 31  GLUCOSE 152* 157* 244* 242* 217* 157*  BUN 26* 23 27* 29* 29* 27*  CREATININE 0.74 0.73 0.81 0.78 0.75 0.75  CALCIUM 7.3* 7.3* 7.7* 7.1* 7.1* 7.1*  MG 1.4*  --   --   --   --   --   PHOS 2.7 2.3* 2.7 3.5 3.0 2.3*   GFR: Estimated Creatinine Clearance: 47.6 mL/min (by C-G formula based on SCr of 0.75 mg/dL). Liver Function Tests: Recent Labs  Lab 09/30/17 0540 10/01/17 0428 10/02/17 0550 10/03/17 0555 10/04/17 0427  AST 28 31 43* 28 23  ALT 29 29 38 29 26  ALKPHOS 76 69 83 69 59  BILITOT 2.5* 2.7* 3.2* 2.3* 2.3*  PROT 5.5* 5.7* 6.2* 5.3* 5.2*  ALBUMIN 1.5* 1.7* 1.7* 1.5* 1.5*   No results for input(s): LIPASE, AMYLASE in the last 168 hours. No results for input(s): AMMONIA in the last 168  hours. Coagulation Profile: No results for input(s): INR, PROTIME in the last 168 hours. Cardiac Enzymes: No results for input(s): CKTOTAL, CKMB, CKMBINDEX, TROPONINI in the last 168 hours. BNP (last 3 results) No results for input(s): PROBNP in the last 8760 hours. HbA1C: No results for input(s): HGBA1C in the last 72 hours. CBG: Recent Labs  Lab 10/03/17 1157 10/03/17 1743 10/04/17 0020 10/04/17 0532 10/04/17 1139  GLUCAP 173* 200* 202* 163* 374*   Lipid Profile: No results for input(s): CHOL, HDL, LDLCALC, TRIG, CHOLHDL, LDLDIRECT in the last 72 hours. Thyroid Function Tests: No results for input(s): TSH, T4TOTAL, FREET4, T3FREE, THYROIDAB in the last 72 hours. Anemia Panel: No results for input(s): VITAMINB12, FOLATE, FERRITIN, TIBC, IRON, RETICCTPCT in the last 72 hours. Sepsis Labs: No results for input(s): PROCALCITON, LATICACIDVEN in the last 168 hours.  Recent Results (from the past 240 hour(s))  Culture, blood (Routine X 2) w Reflex to ID Panel     Status: None   Collection Time: 09/25/17  7:28 PM  Result Value Ref Range Status   Specimen Description   Final    BLOOD  LEFT ANTECUBITAL Performed at [facility] [address], [place], [place] [address]    Special Requests   Final    BOTTLES DRAWN AEROBIC AND ANAEROBIC Blood Culture adequate volume Performed at [facility] [address], [place], [place] [address]    Culture   Final    NO GROWTH 5 DAYS Performed at [facility], [place] [address], [place], [place] [address]    Report Status 10/01/2017 FINAL  Final  Culture, blood (Routine X 2) w Reflex to ID Panel     Status: None   Collection Time: 09/25/17  7:28 PM  Result Value Ref Range Status   Specimen Description   Final    BLOOD LEFT HAND Performed at [facility], [place] [address], [place], [place] [address]    Special Requests   Final    BOTTLES DRAWN AEROBIC AND ANAEROBIC Blood Culture results may  not be optimal due to an inadequate volume of blood received in culture bottles Performed at [facility] [address], [place], [place] [address]    Culture   Final    NO GROWTH 5 DAYS Performed at [facility], [place] [address], [place],  [address]    Report Status 10/01/2017 FINAL  Final         Radiology Studies: Dg Chest Port 1 View  Result Date: 10/03/2017 CLINICAL DATA:  Acute respiratory failure EXAM: PORTABLE CHEST 1 VIEW COMPARISON:  09/28/2017 FINDINGS: The cardiac shadow is again enlarged. Right-sided PICC line is again seen. Patchy infiltrate is noted in the left upper lung but improved from the prior exam. The right lung is now clear. No bony abnormality is noted. IMPRESSION: Improving infiltrates with mild residual on the left. Electronically Signed   By: Inez Catalina M.D.   On: 10/03/2017 14:00        Scheduled Meds: . collagenase   Topical Daily  . digoxin  0.25 mg Intravenous Once  . feeding supplement (ENSURE ENLIVE)  237 mL Oral TID BM  . insulin aspart  0-9 Units Subcutaneous Q6H  . ketorolac  1 drop Both Eyes QID  . lidocaine  1 patch Transdermal QAC breakfast  . mouth rinse  15 mL Mouth Rinse BID  . metoprolol tartrate  75 mg Oral BID  . mirtazapine  15 mg Oral QHS  . multivitamin with minerals  1 tablet Oral Daily  . nystatin  5 mL Oral Q8H  . polyethylene glycol  17 g Oral BID  . potassium chloride  40 mEq Oral Once  . prednisoLONE acetate  1 drop Both Eyes QID  . sodium chloride flush  10-40 mL Intracatheter Q12H  . venetoclax  400 mg Oral Daily   Continuous Infusions: . 0.45 % NaCl with KCl 20 mEq / L 75 mL/hr at 10/04/17 0925     LOS: 28 days     Cordelia Poche, MD [facility] 10/04/2017, 12:54 PM Pager: [phone]  If 7PM-7AM, please contact night-coverage [email or web address] 10/04/2017, 12:54 PM

## 2017-10-04 NOTE — Telephone Encounter (Signed)
Message given to Dr. Irene Limbo that he needs to see the patient today.

## 2017-10-04 NOTE — Progress Notes (Signed)
Per Dr. Alvy Bimler. Gave message to Lanelle Bal, RN, Dr. Grier Mitts nurse. This is Dr. Antonieta Pert patient that Dr. Irene Limbo is to see. Family is expecting Dr. Irene Limbo to see today. Lanelle Bal will give message to Dr. Irene Limbo.

## 2017-10-04 NOTE — Progress Notes (Signed)
PT Cancellation Note  Patient Details Name: Marrianne J Martinique MRN: [id number] DOB: 11-12-1934   Cancelled Treatment:    Reason Eval/Treat Not Completed: Medical issues which prohibited therapy, per RN, recommend to not cause any pain/agitation today.  May consider DC  Hydro. 7/5 was to be last visit.   Claretha Cooper 10/04/2017, 3:49 PM Tresa Endo PT [phone]

## 2017-10-04 NOTE — Progress Notes (Signed)
PT Cancellation Note  Patient Details Name: Leah Lewis MRN: [id number] DOB: Mar 17, 1935   Cancelled Treatment:    Reason Eval/Treat Not Completed: Medical issues which prohibited therapy, patient transferring to ICU due to low BP and  Increased HR. will check with Nursing in PM for stability for Hydrotherapy.   Claretha Cooper 10/04/2017, 11:08 AM  Tresa Endo PT [phone]

## 2017-10-05 LAB — CBC WITH DIFFERENTIAL/PLATELET
BASOS PCT: 0 %
BLASTS: 1 %
Basophils Absolute: 0 10*3/uL (ref 0.0–0.1)
Eosinophils Absolute: 0 10*3/uL (ref 0.0–0.7)
Eosinophils Relative: 0 %
HEMATOCRIT: 23.3 % — AB (ref 36.0–46.0)
Hemoglobin: 7.8 g/dL — ABNORMAL LOW (ref 12.0–15.0)
LYMPHS ABS: 0.6 10*3/uL — AB (ref 0.7–4.0)
Lymphocytes Relative: 19 %
MCH: 30.6 pg (ref 26.0–34.0)
MCHC: 33.5 g/dL (ref 30.0–36.0)
MCV: 91.4 fL (ref 78.0–100.0)
MYELOCYTES: 2 %
Monocytes Absolute: 1.1 10*3/uL — ABNORMAL HIGH (ref 0.1–1.0)
Monocytes Relative: 35 %
NEUTROS ABS: 1.4 10*3/uL — AB (ref 1.7–7.7)
Neutrophils Relative %: 43 %
Platelets: <5 10*3/uL — CL (ref 150–400)
RBC: 2.55 MIL/uL — ABNORMAL LOW (ref 3.87–5.11)
RDW: 16.1 % — AB (ref 11.5–15.5)
WBC: 3.1 10*3/uL — ABNORMAL LOW (ref 4.0–10.5)

## 2017-10-05 LAB — COMPREHENSIVE METABOLIC PANEL
ALK PHOS: 59 U/L (ref 38–126)
ALT: 23 U/L (ref 0–44)
ALT: 24 U/L (ref 0–44)
ANION GAP: 7 (ref 5–15)
AST: 22 U/L (ref 15–41)
AST: 27 U/L (ref 15–41)
Albumin: 1.4 g/dL — ABNORMAL LOW (ref 3.5–5.0)
Albumin: 1.5 g/dL — ABNORMAL LOW (ref 3.5–5.0)
Alkaline Phosphatase: 62 U/L (ref 38–126)
Anion gap: 9 (ref 5–15)
BILIRUBIN TOTAL: 2.9 mg/dL — AB (ref 0.3–1.2)
BILIRUBIN TOTAL: 2.6 mg/dL — AB (ref 0.3–1.2)
BUN: 22 mg/dL (ref 8–23)
BUN: 25 mg/dL — AB (ref 8–23)
CALCIUM: 6.6 mg/dL — AB (ref 8.9–10.3)
CO2: 29 mmol/L (ref 22–32)
CO2: 29 mmol/L (ref 22–32)
CREATININE: 0.63 mg/dL (ref 0.44–1.00)
CREATININE: 0.78 mg/dL (ref 0.44–1.00)
Calcium: 6.8 mg/dL — ABNORMAL LOW (ref 8.9–10.3)
Chloride: 103 mmol/L (ref 98–111)
Chloride: 103 mmol/L (ref 98–111)
GFR calc Af Amer: >60 mL/min (ref >60)
GFR calc Af Amer: >60 mL/min (ref >60)
Glucose, Bld: 174 mg/dL — ABNORMAL HIGH (ref 70–99)
Glucose, Bld: 272 mg/dL — ABNORMAL HIGH (ref 70–99)
Potassium: 4.5 mmol/L (ref 3.5–5.1)
Potassium: 3.4 mmol/L — ABNORMAL LOW (ref 3.5–5.1)
Sodium: 139 mmol/L (ref 135–145)
Sodium: 141 mmol/L (ref 135–145)
TOTAL PROTEIN: 5 g/dL — AB (ref 6.5–8.1)
TOTAL PROTEIN: 5.3 g/dL — AB (ref 6.5–8.1)

## 2017-10-05 LAB — CBC
HCT: 25.9 % — ABNORMAL LOW (ref 36.0–46.0)
Hemoglobin: 8.6 g/dL — ABNORMAL LOW (ref 12.0–15.0)
MCH: 30.5 pg (ref 26.0–34.0)
MCHC: 33.2 g/dL (ref 30.0–36.0)
MCV: 91.8 fL (ref 78.0–100.0)
RBC: 2.82 MIL/uL — ABNORMAL LOW (ref 3.87–5.11)
RDW: 15.9 % — AB (ref 11.5–15.5)
WBC: 2.8 10*3/uL — AB (ref 4.0–10.5)

## 2017-10-05 LAB — LACTATE DEHYDROGENASE: LDH: 366 U/L — ABNORMAL HIGH (ref 98–192)

## 2017-10-05 LAB — GLUCOSE, CAPILLARY
GLUCOSE-CAPILLARY: 169 mg/dL — AB (ref 70–99)
GLUCOSE-CAPILLARY: 206 mg/dL — AB (ref 70–99)
GLUCOSE-CAPILLARY: 238 mg/dL — AB (ref 70–99)
GLUCOSE-CAPILLARY: 249 mg/dL — AB (ref 70–99)
Glucose-Capillary: 186 mg/dL — ABNORMAL HIGH (ref 70–99)

## 2017-10-05 LAB — PHOSPHORUS
PHOSPHORUS: 2.4 mg/dL — AB (ref 2.5–4.6)
PHOSPHORUS: 2.7 mg/dL (ref 2.5–4.6)

## 2017-10-05 LAB — HEMOGLOBIN AND HEMATOCRIT, BLOOD
HCT: 25.3 % — ABNORMAL LOW (ref 36.0–46.0)
HEMOGLOBIN: 8.5 g/dL — AB (ref 12.0–15.0)

## 2017-10-05 LAB — PREPARE RBC (CROSSMATCH)

## 2017-10-05 LAB — URIC ACID
Uric Acid, Serum: 2.8 mg/dL (ref 2.5–7.1)
Uric Acid, Serum: 3.1 mg/dL (ref 2.5–7.1)

## 2017-10-05 MED ORDER — METOPROLOL TARTRATE 5 MG/5ML IV SOLN
5.0000 mg | Freq: Three times a day (TID) | INTRAVENOUS | Status: DC | PRN
  Administered 2017-10-06 – 2017-10-11 (×3): 5 mg via INTRAVENOUS
  Filled 2017-10-05 (×3): qty 5

## 2017-10-05 MED ORDER — SODIUM CHLORIDE 0.9% IV SOLUTION
Freq: Once | INTRAVENOUS | Status: AC
  Administered 2017-10-05: 10:00:00 via INTRAVENOUS

## 2017-10-05 MED ORDER — POTASSIUM CHLORIDE CRYS ER 20 MEQ PO TBCR
40.0000 meq | EXTENDED_RELEASE_TABLET | Freq: Once | ORAL | Status: DC
  Filled 2017-10-05: qty 2

## 2017-10-05 MED ORDER — SODIUM CHLORIDE 0.45 % IV SOLN
Freq: Once | INTRAVENOUS | Status: AC
  Administered 2017-10-05: 11:00:00 via INTRAVENOUS

## 2017-10-05 MED ORDER — CHLORHEXIDINE GLUCONATE CLOTH 2 % EX PADS
6.0000 | MEDICATED_PAD | Freq: Every day | CUTANEOUS | Status: DC
  Administered 2017-10-05 – 2017-10-10 (×6): 6 via TOPICAL

## 2017-10-05 NOTE — Progress Notes (Signed)
OT Cancellation Note  Patient Details Name: Leah Lewis MRN: [id number] DOB: 1934/06/12   Cancelled Treatment:    Reason Eval/Treat Not Completed: Other (comment). Signing off due to medical decline. Noted prognosis is grave. If condition improves and pt can tolerate OT, please reorder at that time. Thank you.  Argyle Gustafson 10/05/2017, 7:15 AM  [name], OTR/L [phone] 10/05/2017

## 2017-10-05 NOTE — Evaluation (Signed)
Clinical/Bedside Swallow Evaluation Patient Details  Name: Leah Lewis MRN: [id number] Date of Birth: 1934/07/16  Today's Date: 10/05/2017 Time: SLP Start Time (ACUTE ONLY): 0930 SLP Stop Time (ACUTE ONLY): 1015 SLP Time Calculation (min) (ACUTE ONLY): 45 min  Past Medical History:  Past Medical History:  Diagnosis Date  . 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 Atrial fibrillation ([place]) 09/15/2017  . Goals of care, counseling/discussion 09/12/2017  . High cholesterol   . Hypertension   . Stroke ([place])   . Thrush 09/22/2017   Past Surgical History:  Past Surgical History:  Procedure Laterality Date  . ABDOMINAL HYSTERECTOMY    . CATARACT EXTRACTION     bilateral  . CESAREAN SECTION     HPI:  83 year old female admitted 09/19/2017 with generalized weakness and fatigue due to pancytopenia/acute myeloblastic leukemia. PMH: HTN, CVA, L DVT, hx 30# weight loss over the past year.   Assessment / Plan / Recommendation Clinical Impression  Pt sleeping upon arrival of SLP. She awakened easily, and accepted oral care with suction. Pt is edentulous, but has upper and lower dentures. No dental adhesive at this time, so dentures were not placed. (Daughter will bring some in). Adequate oral motor strength and function noted. Trials of thin liquid, puree, and soft solids were tolerated with no overt s/s aspiration, and trace lingual residue of softened graham cracker. Liquid wash effectively cleared lingual residue. RN provided whole pills one at a time in puree, which pt tolerated well. Will advance diet to Dys 2 and thin liquids, recommending meds whole one at a time. Safe swallow precautions were posted at [facility]. Pt, family, RN, and MD informed of results and recommendations. ST will follow briefly to assess diet tolerance and provide education.  SLP Visit Diagnosis: Dysphagia, unspecified (R13.10)    Aspiration Risk  Mild aspiration risk    Diet Recommendation Dysphagia 2 (Fine chop);Thin  liquid   Liquid Administration via: Straw Medication Administration: Whole meds with puree Supervision: Staff to assist with self feeding;Full supervision/cueing for compensatory strategies Postural Changes: Seated upright at 90 degrees;Remain upright for at least 30 minutes after po intake    Other  Recommendations Oral Care Recommendations: Oral care QID;Staff/trained caregiver to provide oral care Other Recommendations: Have oral suction available   Follow up Recommendations None      Frequency and Duration min 1 x/week  1 week       Prognosis Prognosis for Safe Diet Advancement: Fair      Swallow Study   General Date of Onset: 09/14/2017 HPI: 83 year old female admitted 09/10/2017 with generalized weakness and fatigue due to pancytopenia/acute myeloblastic leukemia. PMH: HTN, CVA, L DVT, hx 30# weight loss over the past year. Type of Study: Bedside Swallow Evaluation Previous Swallow Assessment: no prior ST intervention Diet Prior to this Study: Dysphagia 1 (puree);Thin liquids Temperature Spikes Noted: No Respiratory Status: Nasal cannula History of Recent Intubation: No Behavior/Cognition: Alert;Cooperative;Pleasant mood Oral Cavity Assessment: Within Functional Limits Oral Care Completed by SLP: Yes Oral Cavity - Dentition: Edentulous Vision: Functional for self-feeding Self-Feeding Abilities: Total assist Patient Positioning: Upright in bed Baseline Vocal Quality: Normal Volitional Cough: Strong Volitional Swallow: Able to elicit    Oral/Motor/Sensory Function Overall Oral Motor/Sensory Function: Within functional limits   Ice Chips Ice chips: Within functional limits Presentation: Spoon   Thin Liquid Thin Liquid: Within functional limits Presentation: Straw    Nectar Thick Nectar Thick Liquid: Not tested   Honey Thick Honey Thick Liquid: Not  tested   Puree Puree: Within functional limits Presentation: Spoon   Solid   GO   Solid: Within functional  limits(graham cracker softened in applesauce) Presentation: Hawthorne B. Quentin Ore, [facility], [place] Speech Language Pathologist [phone]  Shonna Chock 10/05/2017,10:24 AM

## 2017-10-05 NOTE — Progress Notes (Signed)
[facility]   HEMATOLOGY/ONCOLOGY INPATIENT PROGRESS NOTE  Date of Service: 10/05/2017  Inpatient Attending: .Shelly Coss, MD  SUBJECTIVE:   Leah Lewis reports that she is doing well overall and has had a few bites to eat. She notes that her breathing is a little better today. She denies any abdominal pains and notes that she has not had a bowel movement today.   The pt reports that she hasn't seen her daughter today.  She notes that she would prefer to go home when she leaves the hospital. She has very little insight into her medical condition. No family at bedside.   Lab results today (10/05/17) of CBC w/diff, CMP, and Reticulocytes is as follows: all values are WNL except for WBC at 3.1k, RBC at 2.55, HGB at 7.8, HCT at 23.3, RDW at 16.1, PLT at 5k, ANC at 1.4k, Lymphs abs at 600, Monocytes abs at 1.1k.  On review of systems, pt reports breathing better today, eating some, and denies any bleeding, abdominal pains, bowel movements today, and any other symptoms.    OBJECTIVE:  PHYSICAL EXAMINATION: . Vitals:   10/05/17 1911 10/05/17 2000 10/05/17 2100 10/05/17 2200  BP:  (!) 112/59 95/62 (!) 108/49  Pulse:  (!) 104 96 (!) 106  Resp:  (!) 31 (!) 34 (!) 31  Temp: 99.3 F (37.4 C)     TempSrc: Axillary     SpO2:  93% 94% 95%  Weight:      Height:       Filed Weights   10/01/17 1121 10/02/17 0400 10/03/17 0457  Weight: 137 lb (62.1 kg) 139 lb (63 kg) 144 lb (65.3 kg)   .Body mass index is 24.72 kg/m.  CVS S1S2 irreg tachy Lung - b/l scattered rales Neuro - alert, moving all 4 extremities Ext- 1+ b/l pedal edema  MEDICAL HISTORY:   Past Medical History:  Diagnosis Date  . AML (acute myeloid leukemia) ([place]) 09/12/2017  . Atrial fibrillation ([place]) 09/15/2017  . Goals of care, counseling/discussion 09/12/2017  . High cholesterol   . Hypertension   . Stroke ([place])   . Thrush 09/22/2017    SURGICAL HISTORY: Past Surgical History:  Procedure Laterality Date  . ABDOMINAL  HYSTERECTOMY    . CATARACT EXTRACTION     bilateral  . CESAREAN SECTION      SOCIAL HISTORY: Social History   Socioeconomic History  . Marital status: Single    Spouse name: Not on file  . Number of children: Not on file  . Years of education: Not on file  . Highest education level: Not on file  Occupational History  . Not on file  Social Needs  . Financial resource strain: Not on file  . Food insecurity:    Worry: Not on file    Inability: Not on file  . Transportation needs:    Medical: Not on file    Non-medical: Not on file  Tobacco Use  . Smoking status: Never Smoker  . Smokeless tobacco: Former Systems developer    Types: Chew  Substance and Sexual Activity  . Alcohol use: No  . Drug use: No  . Sexual activity: Not on file  Lifestyle  . Physical activity:    Days per week: Not on file    Minutes per session: Not on file  . Stress: Not on file  Relationships  . Social connections:    Talks on phone: Not on file    Gets together: Not on file    Attends  religious service: Not on file    Active member of club or organization: Not on file    Attends meetings of clubs or organizations: Not on file    Relationship status: Not on file  . Intimate partner violence:    Fear of current or ex partner: Not on file    Emotionally abused: Not on file    Physically abused: Not on file    Forced sexual activity: Not on file  Other Topics Concern  . Not on file  Social History Narrative  . Not on file    FAMILY HISTORY: Family History  Problem Relation Age of Onset  . Heart attack Mother   . Pneumonia Father   . Parkinsonism Father   . Breast cancer Sister     ALLERGIES:  is allergic to percocet [oxycodone-acetaminophen].  MEDICATIONS:  Scheduled Meds: . Chlorhexidine Gluconate Cloth  6 each Topical Daily  . collagenase   Topical Daily  . digoxin  0.25 mg Intravenous Once  . feeding supplement (ENSURE ENLIVE)  237 mL Oral TID BM  . insulin aspart  0-9 Units  Subcutaneous Q6H  . ketorolac  1 drop Both Eyes QID  . lidocaine  1 patch Transdermal QAC breakfast  . mouth rinse  15 mL Mouth Rinse BID  . mirtazapine  15 mg Oral QHS  . multivitamin with minerals  1 tablet Oral Daily  . nystatin  5 mL Oral Q8H  . polyethylene glycol  17 g Oral BID  . potassium chloride  40 mEq Oral Once  . prednisoLONE acetate  1 drop Both Eyes QID  . sodium chloride flush  10-40 mL Intracatheter Q12H   Continuous Infusions: . sodium chloride 75 mL/hr at 10/05/17 1047   PRN Meds:.acetaminophen **OR** acetaminophen, alteplase, heparin lock flush, heparin lock flush, heparin lock flush, heparin lock flush, HYDROcodone-acetaminophen, metoprolol tartrate, ondansetron (ZOFRAN) IV, sodium chloride flush, sodium chloride flush, sodium chloride flush  REVIEW OF SYSTEMS:    .10 Point review of Systems was done is negative except as noted above.   LABORATORY DATA:  I have reviewed the data as listed  . CBC Latest Ref Rng & Units 10/05/2017 10/05/2017 10/04/2017  WBC 4.0 - 10.5 K/uL - 3.1(L) 2.8(L)  Hemoglobin 12.0 - 15.0 g/dL 8.5(L) 7.8(L) 8.6(L)  Hematocrit 36.0 - 46.0 % 25.3(L) 23.3(L) 25.9(L)  Platelets 150 - 400 K/uL - <5(LL) <5(LL)    CMP Latest Ref Rng & Units 10/05/2017 10/04/2017 10/03/2017  Glucose 70 - 99 mg/dL 174(H) 157(H) 217(H)  BUN 8 - 23 mg/dL 22 27(H) 29(H)  Creatinine 0.44 - 1.00 mg/dL 0.63 0.75 0.75  Sodium 135 - 145 mmol/L 139 146(H) 145  Potassium 3.5 - 5.1 mmol/L 4.5 3.2(L) 2.9(L)  Chloride 98 - 111 mmol/L 103 108 104  CO2 22 - 32 mmol/L 29 31 32  Calcium 8.9 - 10.3 mg/dL 6.6(L) 7.1(L) 7.1(L)  Total Protein 6.5 - 8.1 g/dL 5.0(L) 5.2(L) 5.3(L)  Total Bilirubin 0.3 - 1.2 mg/dL 2.9(H) 2.3(H) 2.3(H)  Alkaline Phos 38 - 126 U/L 59 59 69  AST 15 - 41 U/L '22 23 28  '$ ALT 0 - 44 U/L '23 26 29     '$ RADIOGRAPHIC STUDIES: I have personally reviewed the radiological images as listed and agreed with the findings in the report. Dg Chest 1 View  Result Date:  09/25/2017 CLINICAL DATA:  Fever. EXAM: CHEST  1 VIEW COMPARISON:  09/21/2017. FINDINGS: Stable enlarged cardiac silhouette. Stable patchy opacity in the left mid lung zone  with mildly increased patchy opacity in the left lower lung zone. Interval small amount of patchy opacity beneath the minor fissure on the right. Stable mildly prominent interstitial markings. Thoracic spine degenerative changes. Right PICC tip in the inferior aspect of the superior vena cava approximately 1.5 cm above the superior cavoatrial junction. IMPRESSION: 1. Mildly increased left lung pneumonia. 2. Interval small amount of probable pneumonia in the right mid to lower lung zone, possibly in the right middle lobe. 3. Stable cardiomegaly and mild chronic interstitial lung disease. Electronically Signed   By: Claudie Revering M.D.   On: 09/25/2017 21:02   Dg Chest 2 View  Result Date: 09/19/2017 CLINICAL DATA:  Generalized weakness EXAM: CHEST - 2 VIEW COMPARISON:  03/02/2015 FINDINGS: The lungs are well inflated. The cardiomediastinal contours are normal. Hazy opacities in the right lung base are unchanged. There is no pleural effusion or pneumothorax. IMPRESSION: No active cardiopulmonary disease. Electronically Signed   By: Ulyses Jarred M.D.   On: 09/19/2017 15:25   Ct Chest Wo Contrast  Result Date: 09/19/2017 CLINICAL DATA:  Generalized weakness and fatigue. Fever, anemia. AML. EXAM: CT CHEST WITHOUT CONTRAST TECHNIQUE: Multidetector CT imaging of the chest was performed following the standard protocol without IV contrast. COMPARISON:  None. FINDINGS: Cardiovascular: Right PICC tip terminates junction at the SVC. Atherosclerotic calcification of the arterial vasculature, including coronary arteries. Pulmonary arteries and heart are enlarged. No pericardial effusion. Mediastinum/Nodes: Thyroid is heterogeneous. Mediastinal lymph nodes measure up to 10 mm in the low right paratracheal station. Hilar regions are difficult to evaluate  without IV contrast. Subpectoral and axillary lymph nodes are not enlarged by CT size criteria. Esophagus is grossly unremarkable. Lungs/Pleura: Image quality is rather degraded by respiratory motion. There is perihilar peribronchovascular ground-glass and consolidation with peripheral sparing. Septal thickening is not an overwhelming feature. No pleural fluid. Airway is unremarkable. Upper Abdomen: Visualized portions of the liver, adrenal glands, kidneys, spleen, pancreas, stomach and bowel are grossly unremarkable. Musculoskeletal: Degenerative changes in the spine. No worrisome lytic or sclerotic lesions. IMPRESSION: 1. Perihilar peribronchovascular ground-glass and consolidation with peripheral sparing. Findings may be due to leukostasis related to AML, pulmonary hemorrhage or pulmonary edema. 2. Borderline enlarged mediastinal lymph nodes. 3. Aortic atherosclerosis (ICD10-170.0). Coronary artery calcification. 4. Enlarged pulmonary arteries, indicative of pulmonary arterial hypertension. Electronically Signed   By: Lorin Picket M.D.   On: 09/19/2017 16:56   Mr Sacrum Si Joints Wo Contrast  Result Date: 09/13/2017 CLINICAL DATA:  Osteomyelitis of the sacrum suspected. Possible history of lymphoma or leukemia. Pancytopenia. EXAM: MRI PELVIS WITHOUT CONTRAST TECHNIQUE: Multiplanar multisequence MR imaging of the pelvis was performed. No intravenous contrast was administered. COMPARISON:  04/26/2007 pelvis MRI FINDINGS: Urinary Tract: Urinary bladder is unremarkable. No evidence of distal hydroureter. Bowel:  Unremarkable visualized pelvic bowel loops. Vascular/Lymphatic: No pathologically enlarged lymph nodes. No significant vascular abnormality seen. Reproductive:  Hysterectomy.  No adnexal mass. Other:  No free fluid. Musculoskeletal: Patchy heterogeneous marrow pattern of the bony pelvis and sacrum. Slight marrow edema of the right iliac bone posteriorly may reflect stigmata of recent marrow biopsy.  Redemonstration of degenerative joint space narrowing of both hips minimal spurring at the femoral head-neck junction bilaterally. There is lower lumbar degenerative disc and facet arthropathy of the included L4-5 and L5-S1 levels. Sacroiliac joints are maintained bilaterally. Pubic symphysis is intact. No marrow signal abnormality suspicious for acute fracture, frank bone destruction or osteomyelitis. Edema of the left obturator internus and pectineus muscles compatible with muscle  strain. IMPRESSION: 1. New heterogeneous ill-defined marrow pattern of the included pelvis and sacrum that may reflect and infiltrative marrow abnormality such as leukemia or lymphoma. 2. Mild marrow edema involving the right iliac bone would be in keeping with the patient's recent bone marrow biopsy site. 3. No conclusive evidence for osteomyelitis. 4. Left pectineus and obturator internus muscle strains. Electronically Signed   By: Ashley Royalty M.D.   On: 09/13/2017 19:02   Ct Biopsy  Result Date: 09/07/2017 INDICATION: Lymphoma versus leukemia, pancytopenia EXAM: CT GUIDED RIGHT ILIAC BONE MARROW ASPIRATION AND CORE BIOPSY Date:  09/07/2017 09/07/2017 12:00 pm Radiologist:  Jerilynn Mages. Daryll Brod, MD Guidance:  CT FLUOROSCOPY TIME:  Fluoroscopy Time: None. MEDICATIONS: 1% lidocaine local ANESTHESIA/SEDATION: 0.5 mg IV Versed; 25 mcg IV Fentanyl Moderate Sedation Time:  10 minutes The patient was continuously monitored during the procedure by the interventional radiology nurse under my direct supervision. CONTRAST:  None. COMPLICATIONS: None PROCEDURE: Informed consent was obtained from the patient following explanation of the procedure, risks, benefits and alternatives. The patient understands, agrees and consents for the procedure. All questions were addressed. A time out was performed. The patient was positioned prone and non-contrast localization CT was performed of the pelvis to demonstrate the iliac marrow spaces. Maximal barrier sterile  technique utilized including caps, mask, sterile gowns, sterile gloves, large sterile drape, hand hygiene, and Betadine prep. Under sterile conditions and local anesthesia, an 11 gauge coaxial bone biopsy needle was advanced into the right iliac marrow space. Needle position was confirmed with CT imaging. Initially, bone marrow aspiration was performed. Next, the 11 gauge outer cannula was utilized to obtain a right iliac bone marrow core biopsy. Needle was removed. Hemostasis was obtained with compression. The patient tolerated the procedure well. Samples were prepared with the cytotechnologist. No immediate complications. IMPRESSION: CT guided right iliac bone marrow aspiration and core biopsy. Electronically Signed   By: Jerilynn Mages.  Shick M.D.   On: 09/07/2017 12:23   Dg Chest Port 1 View  Result Date: 10/03/2017 CLINICAL DATA:  Acute respiratory failure EXAM: PORTABLE CHEST 1 VIEW COMPARISON:  09/28/2017 FINDINGS: The cardiac shadow is again enlarged. Right-sided PICC line is again seen. Patchy infiltrate is noted in the left upper lung but improved from the prior exam. The right lung is now clear. No bony abnormality is noted. IMPRESSION: Improving infiltrates with mild residual on the left. Electronically Signed   By: Inez Catalina M.D.   On: 10/03/2017 14:00   Dg Chest Port 1 View  Result Date: 09/28/2017 CLINICAL DATA:  Follow-up pneumonia EXAM: PORTABLE CHEST 1 VIEW COMPARISON:  09/25/2017 FINDINGS: Cardiac shadow remains enlarged. Lungs are well aerated bilaterally. Persistent left-sided infiltrate and mild right basilar infiltrate is noted. No significant interval change is noted. No effusion is seen. Degenerative changes of the thoracic spine are noted. Right-sided PICC line is again noted and stable. IMPRESSION: Stable bilateral infiltrates left greater than right. Electronically Signed   By: Inez Catalina M.D.   On: 09/28/2017 09:22   Dg Chest Port 1 View  Result Date: 09/21/2017 CLINICAL DATA:   Shortness of breath and fever. Acute myeloid leukemia, atrial fibrillation. EXAM: PORTABLE CHEST 1 VIEW COMPARISON:  Chest x-ray of September 17, 2017 and chest CT scan of September 19, 2017. FINDINGS: The lungs are adequately inflated. The new confluent airspace opacities are present greatest on the left. There is no pleural effusion or pneumothorax. The heart is enlarged. The pulmonary vascularity is indistinct but definite cephalization is not observed.  There is calcification in the wall of the thoracic aorta. The right-sided PICC line tip projects over the midportion of the SVC. IMPRESSION: Interval development of airspace opacities greatest on the left most compatible with pneumonia. Underlying low-grade CHF with stable cardiomegaly. Thoracic aortic atherosclerosis. Electronically Signed   By: David  Lewis M.D.   On: 09/21/2017 08:20   Dg Chest Port 1 View  Result Date: 09/17/2017 CLINICAL DATA:  Fever EXAM: PORTABLE CHEST 1 VIEW COMPARISON:  09/12/2017 FINDINGS: There is cardiomegaly. Vascular congestion. Bilateral patchy airspace opacities, left greater than right. This could reflect asymmetric edema or infection. No effusions. No acute bony abnormality. Right PICC line is in place with the tip at the cavoatrial junction. IMPRESSION: Patchy bilateral airspace disease, left greater than right could reflect asymmetric edema or infection. Electronically Signed   By: Rolm Baptise M.D.   On: 09/17/2017 09:38   Dg Chest Port 1 View  Result Date: 09/12/2017 CLINICAL DATA:  83 year old female with a history of fever EXAM: PORTABLE CHEST 1 VIEW COMPARISON:  09/11/2017 FINDINGS: Cardiomediastinal silhouette unchanged in size and contour. No evidence of central vascular congestion. No pneumothorax or pleural effusion. No confluent airspace disease. No acute displaced fracture. IMPRESSION: Negative for acute cardiopulmonary disease Electronically Signed   By: Corrie Mckusick D.O.   On: 09/12/2017 08:30   Dg Shoulder Right  Port  Result Date: 09/20/2017 CLINICAL DATA:  Right shoulder pain with difficulty raising the arm. No known injury. EXAM: PORTABLE RIGHT SHOULDER COMPARISON:  Limited views of the right shoulder from a scout radiograph of the CT scan of the chest of September 19, 2017 FINDINGS: The bones are subjectively adequately mineralized. The glenohumeral joint space is grossly normal. There is moderate narrowing of the AC joint. The subacromial subdeltoid space is normal. There is a small amount of calcification in the region of the insertion of the rotator cuff on the greater tuberosity of the humerus. The observed portions of the right lung exhibit increased interstitial and patchy airspace opacities. IMPRESSION: Degenerative change of the right shoulder centered on the [facility] joint. No acute bony abnormality. Electronically Signed   By: David  Lewis M.D.   On: 09/20/2017 09:04   Ct Bone Marrow Biopsy & Aspiration  Result Date: 09/07/2017 INDICATION: Lymphoma versus leukemia, pancytopenia EXAM: CT GUIDED RIGHT ILIAC BONE MARROW ASPIRATION AND CORE BIOPSY Date:  09/07/2017 09/07/2017 12:00 pm Radiologist:  Jerilynn Mages. Daryll Brod, MD Guidance:  CT FLUOROSCOPY TIME:  Fluoroscopy Time: None. MEDICATIONS: 1% lidocaine local ANESTHESIA/SEDATION: 0.5 mg IV Versed; 25 mcg IV Fentanyl Moderate Sedation Time:  10 minutes The patient was continuously monitored during the procedure by the interventional radiology nurse under my direct supervision. CONTRAST:  None. COMPLICATIONS: None PROCEDURE: Informed consent was obtained from the patient following explanation of the procedure, risks, benefits and alternatives. The patient understands, agrees and consents for the procedure. All questions were addressed. A time out was performed. The patient was positioned prone and non-contrast localization CT was performed of the pelvis to demonstrate the iliac marrow spaces. Maximal barrier sterile technique utilized including caps, mask, sterile gowns, sterile  gloves, large sterile drape, hand hygiene, and Betadine prep. Under sterile conditions and local anesthesia, an 11 gauge coaxial bone biopsy needle was advanced into the right iliac marrow space. Needle position was confirmed with CT imaging. Initially, bone marrow aspiration was performed. Next, the 11 gauge outer cannula was utilized to obtain a right iliac bone marrow core biopsy. Needle was removed. Hemostasis was obtained with compression.  The patient tolerated the procedure well. Samples were prepared with the cytotechnologist. No immediate complications. IMPRESSION: CT guided right iliac bone marrow aspiration and core biopsy. Electronically Signed   By: Jerilynn Mages.  Shick M.D.   On: 09/07/2017 12:23   Ir Picc Placement Right >5 Yrs Inc Img Guide  Result Date: 09/14/2017 INDICATION: 83 year old female referred for PICC for AML. EXAM: PICC LINE PLACEMENT WITH ULTRASOUND AND FLUOROSCOPIC GUIDANCE MEDICATIONS: None ANESTHESIA/SEDATION: None FLUOROSCOPY TIME:  Fluoroscopy Time: 0 minutes 12 seconds (0.9 mGy). COMPLICATIONS: None PROCEDURE: Informed written consent was obtained from the patient after a thorough discussion of the procedural risks, benefits and alternatives. All questions were addressed. Maximal Sterile Barrier Technique was utilized including caps, mask, sterile gowns, sterile gloves, sterile drape, hand hygiene and skin antiseptic. A timeout was performed prior to the initiation of the procedure. Patient was position in the supine position on the fluoroscopy table with the right arm abducted 90 degrees. Ultrasound survey of the upper extremity was performed with images stored and sent to PACs. The right basilic vein was selected for access. Once the patient was prepped and draped in the usual sterile fashion, the skin and subcutaneous tissues were generously infiltrated with 1% lidocaine for local anesthesia. A micropuncture access kit was then used to access the targeted vein. Wire was passed  centrally, confirmed to be within the venous system under fluoroscopy. A small stab incision was made with an 11 blade scalpel and the sheath was then placed over the wire. Estimated length of the catheter was then performed with the indwelling wire. Catheter was amputated at 37 cm length and placed with coaxial wire through the peel-away. Double-lumen, power injectable PICC in the basilic vein. Tip confirmed at the cavoatrial junction, and the catheter is ready for use. Stat lock was placed. Patient tolerated the procedure well and remained hemodynamically stable throughout. No complications were encountered and no significant blood loss was encountered. IMPRESSION: Status post right upper extremity PICC.  Catheter ready for use. Signed, Dulcy Fanny. Dellia Nims, RPVI [facility] Electronically Signed   By: Corrie Mckusick D.O.   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 Received 1st cycle of dacogen 6/12-6/17 2) Pancytopenia due to AML + medication effect 3) s/p Oral thrush  4) Afib with RVR 5) Anemia - hgb 7.5  6) thrombocytopenia platelets better with transfusion 6k-->18k-->5k  PLAN -daily CBC /diff/platelet, bmp,  -transfuse CMV neg, irradiated blood products only -transfuse platelet if <10K or actively bleeding (will need rpt cbc for post count 30 mins post each transfusion). -transfuse hgb for <8  -overall grave prognosis. Palliative care following. Talked to daughter Joycelyn Schmid on the phone yesterday - not open to any changes in treatment plan setup by dr Jonette Eva.  She was to come to discuss goals of care further today in the hospital but did not. -ongoing goals of care discussion -- no clear disposition plan with current treatment -s/p Dacogen C1 (from 6/12). Due for C2 Dacogen on 7/11 if stable and based on goals of cares. -given severe thrombocytopenia will  hold  Venetoclax at this time.. No evidence of TLS -anticipate the patient will remain transfusion dependent for the upcoming period. -on call physician will follow from tomorrow. -PT/OT - to determine discharge needs. -overall grave prognosis and would be appropriate for hospice if the family so chooses.  6) Hypokalemia - mx per hospitalist -  replace K+ and magnesium as needed to maintain K>4 and Magnesium >2  . The total time spent in the appointment was 25 minutes and more than 50% was on counseling and direct patient cares.       Sullivan Lone MD MS AAHIVMS [facility] Hematology/Oncology Physician [facility]  (Office):       [phone] (Work cell):  [phone] (Fax):           [phone]  10/05/2017 12:51 PM  I, Baldwin [place], am acting as a Education administrator for Dr Irene Limbo.   .I have reviewed the above documentation for accuracy and completeness, and I agree with the above. Sullivan Lone MD MS

## 2017-10-05 NOTE — Progress Notes (Signed)
RN spoke with patient's daughter Albertina Parr who voiced concern regarding the patient's overall care. She noted frustration with the patient's diet; The patient normally only enjoys oatmeal but has been unable to order it with the dys 1 diet. Per the daughter the patient was changed to this diet because she was "pocketing food." No formal swallowing evaluation has been completed though one was ordered yesterday. RN paged speech to figure out a timeline for when the patient may be evaluated. RN stated that it is our goal to help the patient meet her goals safely and that if she would like to eat oatmeal, the first step would be a swallow evaluation. Daughter indicated she felt there was a lack of communication between the interdisciplinary team. RN acknowledged the families feelings and stated that our goal is to work together alongside the patient and her family to aide the patient in improving. Charge nurse, Yehuda Budd, made aware of families feelings.

## 2017-10-05 NOTE — Progress Notes (Signed)
PROGRESS NOTE    Leah Lewis  KAJ:[id number] DOB: 1934/04/14 DOA: 09/03/2017 PCP: Wenda Low, MD   Brief Narrative:  Patient is a 83 year old female with past medical history of osteoporosis, hyperlipidemia, DVT who presented with generalized weakness and found to have pancytopenia secondary to AML.  She has received multiple PRBC and platelet transfusion during this admission.  Her hospital course was remarkable for development of A. fib with RVR.  Oncology is following and she has been started on chemotherapy.  Patient has ongoing issues with pancytopenia: anemia and thrombocytopenia.  Though patient has very poor prognosis, family wants to continue treatment and chemotherapy.  Palliative care was also following.  Plan is to discuss with family again today by oncology for discussion of goals of care.  Assessment & Plan:   Active Problems:   Pressure injury of skin   AML (acute myeloid leukemia) (HCC)   Goals of care, counseling/discussion   AML (acute myeloblastic leukemia) ([place])   Advance care planning   Palliative care by specialist   Fever   Pancytopenia ([place])   Atrial fibrillation with RVR ([place])   Thrush   Thrombocytopenia ([place])   Advanced care planning/counseling discussion  AML: Started on chemotherapy on 6/24.  Grave prognosis.  Received first cycle of Dacogen from 6/12 to 6/17.  Plan for his second cycle of Dacogen on 7/11 if she remains stable.  On venetoclax. Oncology to discuss with family today.  She follows with Dr. Marin Olp as an outpatient.  Anemia/thrombocytopenia: Patient will be transfused with 1 unit of PRBC and 1 unit of platelets again today.  Will transfuse with irradiated, CMV negative blood.  Total of 8 units of PRBC and 12 units of platelets so far.  A. fib with RVR: Heart rate around 110 this morning.  Not on anti-coagulation due to thrombocytopenia.  She was treated with Cardizem and was transitioned to metoprolol secondary to drug interaction with  chemotherapy.  She was also noted to be mildly hypotensive this morning.  Cardiology was consulted but have signed off again.  Cardiology did not want to start her on Cardizem or amiodarone drip her digoxin.  Recommended continuing metoprolol.  Right middle lobe pneumonia:  She had completed the course of antibiotics  Oral thrush: Continue fluconazole.  Acute respiratory failure with hypoxia: In the setting of pneumonia.  Pneumonia was treated with antibiotics.  Last chest x-ray showed improved infiltrates.  Diabetes mellitus: Continue sliding scale insulin.  Hypokalemia: Currently being supplemented.  Malnutrition: Nutrition team following.  Nutrition team was recommending feeding tube.  Unsafe condition to place feeding tube due to severe thrombocytopenia.  Essential hypertension: Currently hypotensive.  On PRN metoprolol.       DVT prophylaxis:SCD Code Status: Full Family Communication: None present at the bedside Disposition Plan: Unknown at this point   Consultants: Oncology, cardiology  Procedures: None  Antimicrobials: None  Subjective: Patient seen and examined the bedside this morning.  Remains very weak and lethargic.  Oriented to self only.  Found to be hypotensive and tachycardic.  Objective: Vitals:   10/05/17 0400 10/05/17 0600 10/05/17 0700 10/05/17 0800  BP: (!) 89/49 (!) 109/52 (!) 95/48   Pulse: 99 (!) 101 (!) 102   Resp: (!) 31 (!) 30 (!) 31   Temp:  98.2 F (36.8 C)  99.3 F (37.4 C)  TempSrc:    Axillary  SpO2: 93% 97% 93%   Weight:      Height:        Intake/Output Summary (  Last 24 hours) at 10/05/2017 0835 Last data filed at 10/05/2017 0300 Gross per 24 hour  Intake 1762.5 ml  Output 1450 ml  Net 312.5 ml   Filed Weights   10/01/17 1121 10/02/17 0400 10/03/17 0457  Weight: 62.1 kg (137 lb) 63 kg (139 lb) 65.3 kg (144 lb)    Examination:  General exam: Terminally ill, lethargic  HEENT:PERRL,Oral mucosa moist, Ear/Nose normal on gross  exam Respiratory system: Bilateral decreased air entry in the bases  cardiovascular system: A. fib with RVR. No JVD, murmurs, rubs, gallops or clicks. No pedal edema. Gastrointestinal system: Abdomen is nondistended, soft and nontender. No organomegaly or masses felt. Normal bowel sounds heard. Central nervous system: Not alert or oriented. No focal neurological deficits. Extremities: No edema, no clubbing ,no cyanosis, distal peripheral pulses palpable. Skin: No rashes, lesions,no icterus ,no pallor     Data Reviewed: I have personally reviewed following labs and imaging studies  CBC: Recent Labs  Lab 10/01/17 0428 10/02/17 0550 10/03/17 0555 10/04/17 0427 10/04/17 2335 10/05/17 0517  WBC 5.4 5.8 3.3* 3.1* 2.8* 3.1*  NEUTROABS 2.3 2.7 1.4* 1.4*  --  1.4*  HGB 9.1* 10.2* 7.5* 7.1* 8.6* 7.8*  HCT 27.8* 31.8* 23.3* 22.8* 25.9* 23.3*  MCV 93.3 93.5 94.3 93.4 91.8 91.4  PLT 18* 6* 18* 11* <5* <5*   Basic Metabolic Panel: Recent Labs  Lab 10/01/17 0428  10/02/17 0550 10/02/17 1944 10/03/17 0555 10/04/17 0427 10/05/17 0517  NA 148*   < > 148* 147* 145 146* 139  K 2.9*   < > 3.3* 2.9* 2.9* 3.2* 4.5  CL 108   < > 105 107 104 108 103  CO2 31   < > 31 31 32 31 29  GLUCOSE 152*   < > 244* 242* 217* 157* 174*  BUN 26*   < > 27* 29* 29* 27* 22  CREATININE 0.74   < > 0.81 0.78 0.75 0.75 0.63  CALCIUM 7.3*   < > 7.7* 7.1* 7.1* 7.1* 6.6*  MG 1.4*  --   --   --   --   --   --   PHOS 2.7   < > 2.7 3.5 3.0 2.3* 2.4*   < > = values in this interval not displayed.   GFR: Estimated Creatinine Clearance: 47.6 mL/min (by C-G formula based on SCr of 0.63 mg/dL). Liver Function Tests: Recent Labs  Lab 10/01/17 0428 10/02/17 0550 10/03/17 0555 10/04/17 0427 10/05/17 0517  AST 31 43* 28 23 22   ALT 29 38 29 26 23   ALKPHOS 69 83 69 59 59  BILITOT 2.7* 3.2* 2.3* 2.3* 2.9*  PROT 5.7* 6.2* 5.3* 5.2* 5.0*  ALBUMIN 1.7* 1.7* 1.5* 1.5* 1.4*   No results for input(s): LIPASE, AMYLASE in  the last 168 hours. No results for input(s): AMMONIA in the last 168 hours. Coagulation Profile: No results for input(s): INR, PROTIME in the last 168 hours. Cardiac Enzymes: No results for input(s): CKTOTAL, CKMB, CKMBINDEX, TROPONINI in the last 168 hours. BNP (last 3 results) No results for input(s): PROBNP in the last 8760 hours. HbA1C: No results for input(s): HGBA1C in the last 72 hours. CBG: Recent Labs  Lab 10/04/17 0532 10/04/17 1139 10/04/17 1807 10/05/17 0057 10/05/17 0609  GLUCAP 163* 374* 210* 186* 169*   Lipid Profile: No results for input(s): CHOL, HDL, LDLCALC, TRIG, CHOLHDL, LDLDIRECT in the last 72 hours. Thyroid Function Tests: No results for input(s): TSH, T4TOTAL, FREET4, T3FREE, THYROIDAB in the last  72 hours. Anemia Panel: No results for input(s): VITAMINB12, FOLATE, FERRITIN, TIBC, IRON, RETICCTPCT in the last 72 hours. Sepsis Labs: No results for input(s): PROCALCITON, LATICACIDVEN in the last 168 hours.  Recent Results (from the past 240 hour(s))  Culture, blood (Routine X 2) w Reflex to ID Panel     Status: None   Collection Time: 09/25/17  7:28 PM  Result Value Ref Range Status   Specimen Description   Final    BLOOD LEFT ANTECUBITAL Performed at [facility] [address], [place], [place] [address]    Special Requests   Final    BOTTLES DRAWN AEROBIC AND ANAEROBIC Blood Culture adequate volume Performed at [facility] [address] [place], [place] [address]    Culture   Final    NO GROWTH 5 DAYS Performed at [facility], [place] [address] [place], [place] [address]    Report Status 10/01/2017 FINAL  Final  Culture, blood (Routine X 2) w Reflex to ID Panel     Status: None   Collection Time: 09/25/17  7:28 PM  Result Value Ref Range Status   Specimen Description   Final    BLOOD LEFT HAND Performed at [facility], [place] [address], [place], [place] [address]    Special Requests    Final    BOTTLES DRAWN AEROBIC AND ANAEROBIC Blood Culture results may not be optimal due to an inadequate volume of blood received in culture bottles Performed at [facility] [address], [place], [place] [address]    Culture   Final    NO GROWTH 5 DAYS Performed at [facility], [place] [address], [place], [place] [address]    Report Status 10/01/2017 FINAL  Final         Radiology Studies: Dg Chest Port 1 View  Result Date: 10/03/2017 CLINICAL DATA:  Acute respiratory failure EXAM: PORTABLE CHEST 1 VIEW COMPARISON:  09/28/2017 FINDINGS: The cardiac shadow is again enlarged. Right-sided PICC line is again seen. Patchy infiltrate is noted in the left upper lung but improved from the prior exam. The right lung is now clear. No bony abnormality is noted. IMPRESSION: Improving infiltrates with mild residual on the left. Electronically Signed   By: Inez Catalina M.D.   On: 10/03/2017 14:00        Scheduled Meds: . sodium chloride   Intravenous Once  . Chlorhexidine Gluconate Cloth  6 each Topical Daily  . collagenase   Topical Daily  . digoxin  0.25 mg Intravenous Once  . feeding supplement (ENSURE ENLIVE)  237 mL Oral TID BM  . insulin aspart  0-9 Units Subcutaneous Q6H  . ketorolac  1 drop Both Eyes QID  . lidocaine  1 patch Transdermal QAC breakfast  . mouth rinse  15 mL Mouth Rinse BID  . metoprolol tartrate  5 mg Intravenous Q8H  . mirtazapine  15 mg Oral QHS  . multivitamin with minerals  1 tablet Oral Daily  . nystatin  5 mL Oral Q8H  . polyethylene glycol  17 g Oral BID  . potassium chloride  40 mEq Oral Once  . prednisoLONE acetate  1 drop Both Eyes QID  . sodium chloride flush  10-40 mL Intracatheter Q12H  . venetoclax  400 mg Oral Daily   Continuous Infusions: . 0.45 % NaCl with KCl 20 mEq / L 75 mL/hr at 10/05/17 0205     LOS: 29 days  Time spent: 35 mins.More than 50% of that time was spent in counseling and/or coordination of  care.      Shelly Coss, MD [facility] Pager [phone]  If 7PM-7AM, please contact night-coverage [email or web address] Password [facility] 10/05/2017, 8:35 AM

## 2017-10-05 NOTE — Progress Notes (Signed)
Physical Therapy Discharge Patient Details Name: Latarsha J Martinique MRN: [id number] DOB: January 22, 1935 Today's Date: 10/05/2017 Time:  -     Patient discharged from PT services secondary to medical decline - will need to re-order PT to resume therapy services.  Please see latest therapy progress note for current level of functioning and progress toward goals.  Hydro therapy was to end on 7/5. Patient has  Grave prognosis per MD notes.  [place] PT [phone]  GP     Claretha Cooper 10/05/2017, 7:12 AM

## 2017-10-06 DIAGNOSIS — E440 Moderate protein-calorie malnutrition: Secondary | ICD-10-CM

## 2017-10-06 DIAGNOSIS — Z6824 Body mass index (BMI) 24.0-24.9, adult: Secondary | ICD-10-CM

## 2017-10-06 LAB — COMPREHENSIVE METABOLIC PANEL
ALBUMIN: 1.5 g/dL — AB (ref 3.5–5.0)
ALK PHOS: 67 U/L (ref 38–126)
ALT: 25 U/L (ref 0–44)
ANION GAP: 9 (ref 5–15)
AST: 24 U/L (ref 15–41)
BILIRUBIN TOTAL: 3.5 mg/dL — AB (ref 0.3–1.2)
BUN: 21 mg/dL (ref 8–23)
CO2: 30 mmol/L (ref 22–32)
Calcium: 7.1 mg/dL — ABNORMAL LOW (ref 8.9–10.3)
Chloride: 103 mmol/L (ref 98–111)
Creatinine, Ser: 0.63 mg/dL (ref 0.44–1.00)
GFR calc Af Amer: >60 mL/min (ref >60)
GLUCOSE: 146 mg/dL — AB (ref 70–99)
Potassium: 3 mmol/L — ABNORMAL LOW (ref 3.5–5.1)
Sodium: 142 mmol/L (ref 135–145)
TOTAL PROTEIN: 5.6 g/dL — AB (ref 6.5–8.1)

## 2017-10-06 LAB — CBC WITH DIFFERENTIAL/PLATELET
Band Neutrophils: 4 %
Basophils Absolute: 0 10*3/uL (ref 0.0–0.1)
Basophils Relative: 0 %
Blasts: 0 %
EOS PCT: 0 %
Eosinophils Absolute: 0 10*3/uL (ref 0.0–0.7)
HCT: 27.4 % — ABNORMAL LOW (ref 36.0–46.0)
Hemoglobin: 9.2 g/dL — ABNORMAL LOW (ref 12.0–15.0)
LYMPHS ABS: 0.8 10*3/uL (ref 0.7–4.0)
Lymphocytes Relative: 22 %
MCH: 29.9 pg (ref 26.0–34.0)
MCHC: 33.6 g/dL (ref 30.0–36.0)
MCV: 89 fL (ref 78.0–100.0)
METAMYELOCYTES PCT: 2 %
MONO ABS: 0.9 10*3/uL (ref 0.1–1.0)
MYELOCYTES: 2 %
Monocytes Relative: 25 %
Neutro Abs: 1.9 10*3/uL (ref 1.7–7.7)
Neutrophils Relative %: 45 %
Other: 0 %
PLATELETS: 18 10*3/uL — AB (ref 150–400)
Promyelocytes Relative: 0 %
RBC: 3.08 MIL/uL — ABNORMAL LOW (ref 3.87–5.11)
RDW: 15.7 % — ABNORMAL HIGH (ref 11.5–15.5)
WBC: 3.6 10*3/uL — AB (ref 4.0–10.5)
nRBC: 0 /100 WBC

## 2017-10-06 LAB — GLUCOSE, CAPILLARY
GLUCOSE-CAPILLARY: 158 mg/dL — AB (ref 70–99)
GLUCOSE-CAPILLARY: 205 mg/dL — AB (ref 70–99)
Glucose-Capillary: 233 mg/dL — ABNORMAL HIGH (ref 70–99)

## 2017-10-06 LAB — BPAM PLATELET PHERESIS
Blood Product Expiration Date: 201907052359
ISSUE DATE / TIME: 201907031024
Unit Type and Rh: 6200

## 2017-10-06 LAB — TYPE AND SCREEN
ABO/RH(D): A POS
ANTIBODY SCREEN: NEGATIVE
UNIT DIVISION: 0
Unit division: 0

## 2017-10-06 LAB — BPAM RBC
BLOOD PRODUCT EXPIRATION DATE: 201907262359
Blood Product Expiration Date: 201907272359
ISSUE DATE / TIME: 201907021837
ISSUE DATE / TIME: 201907031251
UNIT TYPE AND RH: 6200
Unit Type and Rh: 6200

## 2017-10-06 LAB — PREPARE PLATELET PHERESIS: UNIT DIVISION: 0

## 2017-10-06 LAB — PHOSPHORUS: PHOSPHORUS: 2.3 mg/dL — AB (ref 2.5–4.6)

## 2017-10-06 LAB — URIC ACID: Uric Acid, Serum: 2.8 mg/dL (ref 2.5–7.1)

## 2017-10-06 LAB — MAGNESIUM: MAGNESIUM: 1.2 mg/dL — AB (ref 1.7–2.4)

## 2017-10-06 LAB — LACTATE DEHYDROGENASE: LDH: 373 U/L — ABNORMAL HIGH (ref 98–192)

## 2017-10-06 MED ORDER — METOPROLOL TARTRATE 5 MG/5ML IV SOLN
5.0000 mg | Freq: Once | INTRAVENOUS | Status: AC
  Administered 2017-10-06: 5 mg via INTRAVENOUS
  Filled 2017-10-06: qty 5

## 2017-10-06 MED ORDER — POTASSIUM CHLORIDE CRYS ER 20 MEQ PO TBCR
40.0000 meq | EXTENDED_RELEASE_TABLET | Freq: Once | ORAL | Status: AC
  Administered 2017-10-06: 40 meq via ORAL
  Filled 2017-10-06: qty 2

## 2017-10-06 MED ORDER — POTASSIUM CHLORIDE 10 MEQ/100ML IV SOLN
10.0000 meq | INTRAVENOUS | Status: AC
  Administered 2017-10-06 (×4): 10 meq via INTRAVENOUS
  Filled 2017-10-06 (×4): qty 100

## 2017-10-06 NOTE — Progress Notes (Signed)
Nutrition Follow-up  DOCUMENTATION CODES:   Not applicable  INTERVENTION:  - Continue Ensure Enlive BID and Magic Cup TID.  - Continue to encourage PO intakes.  - Recommend order of Mg run; unlikely to see any meaningful change to serum K without first/concurrently repleting Mg.   NUTRITION DIAGNOSIS:   Increased nutrient needs related to wound healing as evidenced by estimated needs. -ongoing  GOAL:   Patient will meet greater than or equal to 90% of their needs -unmet  MONITOR:   PO intake, Supplement acceptance, Labs, Weight trends, Skin, I & O's  ASSESSMENT:   83 y.o. female with medical history significant of hypertension, prior stroke hx of left DVT.  Patient presented to the emergency department complaining of generalized weakness and fatigue.   Patient consuming mainly <25% of meals over the past few days and mainly refusing nutrition supplements. Previous MD notes have stated that patient is not a candidate for PEG. She is a/o to self only.   Palliative Care is following and saw patient this AM and note states patient is a candidate for hospice but chemo would need to be stopped first and family will not stop chemo as long as Oncologist recommends it. Dr. Irene Limbo was able to speak with patient's daughter on the phone yesterday but she was not interested in making any changes to treatment for cancer at that time. His note states "overall grave prognosis."   Medications reviewed; sliding scale Novolog, daily multivitamin with minerals, 1 packet Miralax BID, 10 mEq IV KCl x4 runs today, 40 mEq oral KCl x1 dose today, 5 mL Mycostatin TID.  Labs reviewed; CBGs: 158 and 233 mg/dL today, K: 3 mmol/L, Ca: 7.1 mg/dL, Mg: 1.2 mg/dL.   IVF: 1/2 NS @ 75 mL/hr.     Diet Order:   Diet Order           DIET DYS 2 Room service appropriate? Yes; Fluid consistency: Thin  Diet effective now          EDUCATION NEEDS:   Not appropriate for education at this time  Skin:  Skin  Assessment: Skin Integrity Issues: Skin Integrity Issues:: Unstageable Stage II: coccyx Unstageable: full thickness to sacrum and coccyx  Last BM:  7/3  Height:   Ht Readings from Last 1 Encounters:  09/06/17 5\' 4"  (1.626 m)    Weight:   Wt Readings from Last 1 Encounters:  10/03/17 144 lb (65.3 kg)    Ideal Body Weight:  54.5 kg  BMI:  Body mass index is 24.72 kg/m.  Estimated Nutritional Needs:   Kcal:  2025-2160 (30-32 kcal/kg)  Protein:  108-120 grams (1.6-1.8 grams/kg)  Fluid:  1.7L/day     Jarome Matin, MS, RD, LDN, CNSC Inpatient Clinical Dietitian Pager # [phone] After hours/weekend pager # [phone]

## 2017-10-06 NOTE — Progress Notes (Signed)
Leah Lewis   DOB:Jul 27, 1934   ZO#:[id number]   JWJ#:[id number]  Hematology f/u note   Subjective: I am covering Drs. Ennever and Kale to see pt today. She states she feels well, denies any pain, or bleeding.  She ate breakfast well.  She is afebrile.  Received iron left RBC and 1 unit of platelet yesterday.   Objective:  Vitals:   10/06/17 0800 10/06/17 1000  BP: (!) 92/45 103/75  Pulse: (!) 104 (!) 110  Resp: (!) 25 (!) 29  Temp: 97.7 F (36.5 C)   SpO2: 100% 100%    Body mass index is 24.72 kg/m.  Intake/Output Summary (Last 24 hours) at 10/06/2017 1108 Last data filed at 10/06/2017 1000 Gross per 24 hour  Intake 1401.33 ml  Output 1550 ml  Net -148.67 ml     Sclerae unicteric  Oropharynx clear  No peripheral adenopathy  Lungs clear -- no rales or rhonchi  Heart regular rate and rhythm  Abdomen benign  MSK no focal spinal tenderness, no peripheral edema  Neuro nonfocal    CBG (last 3)  Recent Labs    10/05/17 1734 10/05/17 2339 10/06/17 0606  GLUCAP 249* 206* 158*     Labs:  Lab Results  Component Value Date   WBC 3.6 (L) 10/06/2017   HGB 9.2 (L) 10/06/2017   HCT 27.4 (L) 10/06/2017   MCV 89.0 10/06/2017   PLT 18 (LL) 10/06/2017   NEUTROABS 1.9 10/06/2017   CMP Latest Ref Rng & Units 10/06/2017 10/05/2017 10/05/2017  Glucose 70 - 99 mg/dL 146(H) 272(H) 174(H)  BUN 8 - 23 mg/dL 21 25(H) 22  Creatinine 0.44 - 1.00 mg/dL 0.63 0.78 0.63  Sodium 135 - 145 mmol/L 142 141 139  Potassium 3.5 - 5.1 mmol/L 3.0(L) 3.4(L) 4.5  Chloride 98 - 111 mmol/L 103 103 103  CO2 22 - 32 mmol/L 30 29 29   Calcium 8.9 - 10.3 mg/dL 7.1(L) 6.8(L) 6.6(L)  Total Protein 6.5 - 8.1 g/dL 5.6(L) 5.3(L) 5.0(L)  Total Bilirubin 0.3 - 1.2 mg/dL 3.5(H) 2.6(H) 2.9(H)  Alkaline Phos 38 - 126 U/L 67 62 59  AST 15 - 41 U/L 24 27 22   ALT 0 - 44 U/L 25 24 23      Urine Studies No results for input(s): UHGB, CRYS in the last 72 hours.  Invalid input(s): UACOL, UAPR, USPG, UPH, UTP, UGL,  UKET, UBIL, UNIT, UROB, ULEU, UEPI, UWBC, URBC, [place], [place], [place], [place]  Basic Metabolic Panel: Recent Labs  Lab 10/01/17 0428  10/03/17 0555 10/04/17 0427 10/05/17 0517 10/05/17 1841 10/06/17 0316  NA 148*   < > 145 146* 139 141 142  K 2.9*   < > 2.9* 3.2* 4.5 3.4* 3.0*  CL 108   < > 104 108 103 103 103  CO2 31   < > 32 31 29 29 30   GLUCOSE 152*   < > 217* 157* 174* 272* 146*  BUN 26*   < > 29* 27* 22 25* 21  CREATININE 0.74   < > 0.75 0.75 0.63 0.78 0.63  CALCIUM 7.3*   < > 7.1* 7.1* 6.6* 6.8* 7.1*  MG 1.4*  --   --   --   --   --  1.2*  PHOS 2.7   < > 3.0 2.3* 2.4* 2.7 2.3*   < > = values in this interval not displayed.   GFR Estimated Creatinine Clearance: 47.6 mL/min (by C-G formula based on SCr of 0.63 mg/dL). Liver Function Tests: Recent  Labs  Lab 10/03/17 0555 10/04/17 0427 10/05/17 0517 10/05/17 1841 10/06/17 0316  AST 28 23 22 27 24   ALT 29 26 23 24 25   ALKPHOS 69 59 59 62 67  BILITOT 2.3* 2.3* 2.9* 2.6* 3.5*  PROT 5.3* 5.2* 5.0* 5.3* 5.6*  ALBUMIN 1.5* 1.5* 1.4* 1.5* 1.5*   No results for input(s): LIPASE, AMYLASE in the last 168 hours. No results for input(s): AMMONIA in the last 168 hours. Coagulation profile No results for input(s): INR, PROTIME in the last 168 hours.  CBC: Recent Labs  Lab 10/02/17 0550 10/03/17 0555 10/04/17 0427 10/04/17 2335 10/05/17 0517 10/05/17 1841 10/06/17 0316  WBC 5.8 3.3* 3.1* 2.8* 3.1*  --  3.6*  NEUTROABS 2.7 1.4* 1.4*  --  1.4*  --  1.9  HGB 10.2* 7.5* 7.1* 8.6* 7.8* 8.5* 9.2*  HCT 31.8* 23.3* 22.8* 25.9* 23.3* 25.3* 27.4*  MCV 93.5 94.3 93.4 91.8 91.4  --  89.0  PLT 6* 18* 11* <5* <5*  --  18*   Cardiac Enzymes: No results for input(s): CKTOTAL, CKMB, CKMBINDEX, TROPONINI in the last 168 hours. BNP: Invalid input(s): POCBNP CBG: Recent Labs  Lab 10/05/17 0609 10/05/17 1152 10/05/17 1734 10/05/17 2339 10/06/17 0606  GLUCAP 169* 238* 249* 206* 158*   D-Dimer No results for input(s): DDIMER in the  last 72 hours. Hgb A1c No results for input(s): HGBA1C in the last 72 hours. Lipid Profile No results for input(s): CHOL, HDL, LDLCALC, TRIG, CHOLHDL, LDLDIRECT in the last 72 hours. Thyroid function studies No results for input(s): TSH, T4TOTAL, T3FREE, THYROIDAB in the last 72 hours.  Invalid input(s): FREET3 Anemia work up No results for input(s): VITAMINB12, FOLATE, FERRITIN, TIBC, IRON, RETICCTPCT in the last 72 hours. Microbiology Recent Results (from the past 240 hour(s))  Culture, blood (routine x 2)     Status: None (Preliminary result)   Collection Time: 10/04/17 12:59 PM  Result Value Ref Range Status   Specimen Description   Final    BLOOD LEFT HAND Performed at [facility] [address] [place], [place] [address]    Special Requests   Final    BOTTLES DRAWN AEROBIC ONLY Blood Culture results may not be optimal due to an inadequate volume of blood received in culture bottles Performed at [facility] [address] [place], [place] [address]    Culture   Final    NO GROWTH 1 DAY Performed at [facility], [place] [address], [place], [place] [address]    Report Status PENDING  Incomplete  Culture, blood (routine x 2)     Status: None (Preliminary result)   Collection Time: 10/04/17  1:04 PM  Result Value Ref Range Status   Specimen Description   Final    BLOOD LEFT FOREARM Performed at [facility] [address], [place], [place] [address]    Special Requests   Final    BOTTLES DRAWN AEROBIC ONLY Blood Culture results may not be optimal due to an inadequate volume of blood received in culture bottles Performed at [facility] [address] [place], [place] [address]    Culture   Final    NO GROWTH 1 DAY Performed at [facility], [place] [address], [place],  [address]    Report Status PENDING  Incomplete      Studies:  No results found.  Assessment: 83  y.o. AAF with newly diagnosed AML on admission 09/07/2017, and  has been in [facility] for a month now   1. AML with complex cytogenetics, s/p 1 cycle of dacogen 6/12-6/17, and venetoclax (held for now due to cytopenia) 2.  Pancytopenia due to AML and chemo effect, requires frequent blood and platelet transfusion 3.  Afib with RVR 4.  Moderate protein and calorie malnutrition   Plan:  -Will continue holding Venetoclax due to her significant cytopenia  -continue blood transfusion (irradiated blood products, prefer single donor plt if possible) to keep Hg>=8 and plt>=10K -very poor prognosis, she is not a candidate for intensive chemo or bone marrow transplant, not curable, the goal of care is palliative. I am very skeptical that she can tolerate more treatment  -palliative team has been trying to address the goal of care with pt and her family, they are not open to palliative care alone. Likely would need Dr. Antonieta Pert input due to his longer relationship with pt and her family and their trust on him  -will continue f/u and supportive care  Truitt Merle, MD 10/06/2017  11:08 AM

## 2017-10-06 NOTE — Progress Notes (Signed)
PROGRESS NOTE    Leah Lewis  ELF:[id number] DOB: [birth year] DOA: 09/07/2017 PCP: Wenda Low, MD   Brief Narrative:  Patient is a 83 year old female with past medical history of osteoporosis, hyperlipidemia, DVT who presented with generalized weakness and found to have pancytopenia secondary to AML.  She has received multiple PRBC and platelet transfusion during this admission.  Her hospital course was remarkable for development of A. fib with RVR.  Oncology is following and she has been started on chemotherapy.  Patient has ongoing issues with pancytopenia: anemia and thrombocytopenia.  Though patient has very poor prognosis, family wants to continue treatment and chemotherapy.  Palliative care was also following.  Plan is to discuss with family again for discussion of goals of care.  Assessment & Plan:   Active Problems:   Pressure injury of skin   AML (acute myeloid leukemia) (HCC)   Goals of care, counseling/discussion   AML (acute myeloblastic leukemia) ([place])   Advance care planning   Palliative care by specialist   Fever   Pancytopenia ([place])   Atrial fibrillation with RVR ([place])   Thrush   Thrombocytopenia ([place])   Advanced care planning/counseling discussion  AML: Started on chemotherapy on 6/24.  Grave prognosis.  Received first cycle of Dacogen from 6/12 to 6/17.  Plan for his second cycle of Dacogen on 7/11 if she remains stable.  Was on  venetoclax which has been discontinued due to thrombocytopenia. Oncology to discuss with family again. But daughter did not come yesterday. She follows with Dr. Marin Olp as an outpatient.  Anemia/thrombocytopenia: Patient was transfused with 1 unit of PRBC and 1 unit of platelets again on 10/05/17. Hb this morning was 9.2 and platelets were 18 .Whenever indicated ,she  should be  transfused with irradiated, CMV negative blood.  Total of 8 units of PRBC and 12 units of platelets so far.  A. fib with RVR: Heart rate around 110 this morning.   Not on anti-coagulation due to thrombocytopenia.  She was treated with Cardizem and was transitioned to metoprolol secondary to drug interaction with chemotherapy.  She was also noted to be mildly hypotensive this morning.  Cardiology was consulted but have signed off again.  Cardiology did not want to start her on Cardizem or amiodarone drip her digoxin.  Recommended continuing metoprolol.  Right middle lobe pneumonia:  She had completed the course of antibiotics  Oral thrush: Continue nystatin.  Acute respiratory failure with hypoxia: In the setting of pneumonia.  Pneumonia was treated with antibiotics.  Last chest x-ray showed improved infiltrates.  Respiratory status has improved.  Diabetes mellitus: Continue sliding scale insulin.  Hypokalemia: Currently being supplemented.  Will check magnesium  Malnutrition: Nutrition team following.  Nutrition team was recommending feeding tube.  Unsafe condition to place feeding tube due to severe thrombocytopenia.  Essential hypertension: Currently hypotensive.  On PRN metoprolol.   We will try to discuss with family about goals of care and how to move from this point.  Patient looks terminally ill and is a hospice candidate.  Currently patient and family wants to continue management for AML   DVT prophylaxis:SCD Code Status: Full Family Communication: None present at the bedside Disposition Plan: Unknown at this point   Consultants: Oncology, cardiology  Procedures: None  Antimicrobials: None  Subjective: Patient seen and examined the bedside this morning.  Remains weak.  Mildly tachycardic, blood pressure on the lower side.  Oriented only to self.  Objective: Vitals:   10/06/17 0500 10/06/17 0600 10/06/17  0606 10/06/17 0800  BP: 119/69  (!) 93/46   Pulse: (!) 108  (!) 111   Resp: (!) 32  (!) 23   Temp:  (!) 97.5 F (36.4 C) 99.5 F (37.5 C) 97.7 F (36.5 C)  TempSrc:  Oral Axillary Oral  SpO2: 96%  98%   Weight:        Height:        Intake/Output Summary (Last 24 hours) at 10/06/2017 0842 Last data filed at 10/06/2017 0700 Gross per 24 hour  Intake 1028 ml  Output 1650 ml  Net -622 ml   Filed Weights   10/01/17 1121 10/02/17 0400 10/03/17 0457  Weight: 62.1 kg (137 lb) 63 kg (139 lb) 65.3 kg (144 lb)    Examination:   General exam: Chronically ill looking, generalized weakness HEENT:PERRL,Oral mucosa moist, Ear/Nose normal on gross exam Respiratory system: Bilateral equal air entry, normal vesicular breath sounds, no wheezes or crackles  Cardiovascular system: A. fib with RVR but not. No JVD, murmurs, rubs, gallops or clicks. Gastrointestinal system: Abdomen is nondistended, soft and nontender. No organomegaly or masses felt. Normal bowel sounds heard. Central nervous system: Alert , oriented only to self. No focal neurological deficits. Extremities: No edema, no clubbing ,no cyanosis, distal peripheral pulses palpable. Skin: No rashes, lesions or ulcers,no icterus ,no pallor   Data Reviewed: I have personally reviewed following labs and imaging studies  CBC: Recent Labs  Lab 10/02/17 0550 10/03/17 0555 10/04/17 0427 10/04/17 2335 10/05/17 0517 10/05/17 1841 10/06/17 0316  WBC 5.8 3.3* 3.1* 2.8* 3.1*  --  3.6*  NEUTROABS 2.7 1.4* 1.4*  --  1.4*  --  1.9  HGB 10.2* 7.5* 7.1* 8.6* 7.8* 8.5* 9.2*  HCT 31.8* 23.3* 22.8* 25.9* 23.3* 25.3* 27.4*  MCV 93.5 94.3 93.4 91.8 91.4  --  89.0  PLT 6* 18* 11* <5* <5*  --  18*   Basic Metabolic Panel: Recent Labs  Lab 10/01/17 0428  10/03/17 0555 10/04/17 0427 10/05/17 0517 10/05/17 1841 10/06/17 0316  NA 148*   < > 145 146* 139 141 142  K 2.9*   < > 2.9* 3.2* 4.5 3.4* 3.0*  CL 108   < > 104 108 103 103 103  CO2 31   < > 32 31 29 29 30   GLUCOSE 152*   < > 217* 157* 174* 272* 146*  BUN 26*   < > 29* 27* 22 25* 21  CREATININE 0.74   < > 0.75 0.75 0.63 0.78 0.63  CALCIUM 7.3*   < > 7.1* 7.1* 6.6* 6.8* 7.1*  MG 1.4*  --   --   --   --    --  1.2*  PHOS 2.7   < > 3.0 2.3* 2.4* 2.7 2.3*   < > = values in this interval not displayed.   GFR: Estimated Creatinine Clearance: 47.6 mL/min (by C-G formula based on SCr of 0.63 mg/dL). Liver Function Tests: Recent Labs  Lab 10/03/17 0555 10/04/17 0427 10/05/17 0517 10/05/17 1841 10/06/17 0316  AST 28 23 22 27 24   ALT 29 26 23 24 25   ALKPHOS 69 59 59 62 67  BILITOT 2.3* 2.3* 2.9* 2.6* 3.5*  PROT 5.3* 5.2* 5.0* 5.3* 5.6*  ALBUMIN 1.5* 1.5* 1.4* 1.5* 1.5*   No results for input(s): LIPASE, AMYLASE in the last 168 hours. No results for input(s): AMMONIA in the last 168 hours. Coagulation Profile: No results for input(s): INR, PROTIME in the last 168 hours. Cardiac Enzymes:  No results for input(s): CKTOTAL, CKMB, CKMBINDEX, TROPONINI in the last 168 hours. BNP (last 3 results) No results for input(s): PROBNP in the last 8760 hours. HbA1C: No results for input(s): HGBA1C in the last 72 hours. CBG: Recent Labs  Lab 10/05/17 0609 10/05/17 1152 10/05/17 1734 10/05/17 2339 10/06/17 0606  GLUCAP 169* 238* 249* 206* 158*   Lipid Profile: No results for input(s): CHOL, HDL, LDLCALC, TRIG, CHOLHDL, LDLDIRECT in the last 72 hours. Thyroid Function Tests: No results for input(s): TSH, T4TOTAL, FREET4, T3FREE, THYROIDAB in the last 72 hours. Anemia Panel: No results for input(s): VITAMINB12, FOLATE, FERRITIN, TIBC, IRON, RETICCTPCT in the last 72 hours. Sepsis Labs: No results for input(s): PROCALCITON, LATICACIDVEN in the last 168 hours.  Recent Results (from the past 240 hour(s))  Culture, blood (routine x 2)     Status: None (Preliminary result)   Collection Time: 10/04/17 12:59 PM  Result Value Ref Range Status   Specimen Description   Final    BLOOD LEFT HAND Performed at [facility] [address] [place], [place] [address]    Special Requests   Final    BOTTLES DRAWN AEROBIC ONLY Blood Culture results may not be optimal due to an inadequate  volume of blood received in culture bottles Performed at [facility] [address] [place], [place] [address]    Culture   Final    NO GROWTH 1 DAY Performed at [facility], [place] [address], [place], [place] [address]    Report Status PENDING  Incomplete  Culture, blood (routine x 2)     Status: None (Preliminary result)   Collection Time: 10/04/17  1:04 PM  Result Value Ref Range Status   Specimen Description   Final    BLOOD LEFT FOREARM Performed at [facility] [address] [place], [place] [address]    Special Requests   Final    BOTTLES DRAWN AEROBIC ONLY Blood Culture results may not be optimal due to an inadequate volume of blood received in culture bottles Performed at [facility] [address], [place], [place] [address]    Culture   Final    NO GROWTH 1 DAY Performed at [facility], [place] [address] [place], [place] [address]    Report Status PENDING  Incomplete         Radiology Studies: No results found.      Scheduled Meds: . Chlorhexidine Gluconate Cloth  6 each Topical Daily  . collagenase   Topical Daily  . digoxin  0.25 mg Intravenous Once  . feeding supplement (ENSURE ENLIVE)  237 mL Oral TID BM  . insulin aspart  0-9 Units Subcutaneous Q6H  . ketorolac  1 drop Both Eyes QID  . lidocaine  1 patch Transdermal QAC breakfast  . mouth rinse  15 mL Mouth Rinse BID  . mirtazapine  15 mg Oral QHS  . multivitamin with minerals  1 tablet Oral Daily  . nystatin  5 mL Oral Q8H  . polyethylene glycol  17 g Oral BID  . potassium chloride  40 mEq Oral Once  . prednisoLONE acetate  1 drop Both Eyes QID  . sodium chloride flush  10-40 mL Intracatheter Q12H   Continuous Infusions: . potassium chloride 10 mEq (10/06/17 0759)     LOS: 30 days    Time spent: 25 mins.More than 50% of that time was spent in counseling and/or coordination of care.  Shelly Coss,  MD [facility] Pager [phone]  If 7PM-7AM, please contact night-coverage [email or web address] Password [facility] 10/06/2017, 8:42 AM

## 2017-10-06 NOTE — Progress Notes (Signed)
Daily Progress Note   Patient Name: Leah Lewis       Date: 10/06/2017 DOB: [birth year]  Age: 83 y.o. MRN#: [id number] Attending Physician: Leah Coss, MD Primary Care Physician: Leah Low, MD Admit Date: 09/17/2017  Reason for Consultation/Follow-up: Establishing goals of care  Subjective: Awake and alert. States she ate well, no complaints of pain. Oncology at bedside. Chemo currently on hold due to Lewis counts. Now in ICU with afib with RVR. No family at bedside.  ROS  Length of Stay: 30  Current Medications: Scheduled Meds:  . Chlorhexidine Gluconate Cloth  6 each Topical Daily  . collagenase   Topical Daily  . digoxin  0.25 mg Intravenous Once  . feeding supplement (ENSURE ENLIVE)  237 mL Oral TID BM  . insulin aspart  0-9 Units Subcutaneous Q6H  . ketorolac  1 drop Both Eyes QID  . lidocaine  1 patch Transdermal QAC breakfast  . mouth rinse  15 mL Mouth Rinse BID  . mirtazapine  15 mg Oral QHS  . multivitamin with minerals  1 tablet Oral Daily  . nystatin  5 mL Oral Q8H  . polyethylene glycol  17 g Oral BID  . prednisoLONE acetate  1 drop Both Eyes QID  . sodium chloride flush  10-40 mL Intracatheter Q12H    Continuous Infusions:   PRN Meds: acetaminophen **OR** acetaminophen, alteplase, heparin lock flush, heparin lock flush, heparin lock flush, heparin lock flush, HYDROcodone-acetaminophen, metoprolol tartrate, ondansetron (ZOFRAN) IV, sodium chloride flush, sodium chloride flush, sodium chloride flush  Physical Exam          Vital Signs: BP 103/75   Pulse (!) 110   Temp 97.7 F (36.5 C) (Oral)   Resp (!) 29   Ht 5\' 4"  (1.626 m)   Wt 65.3 kg (144 lb)   SpO2 100%   BMI 24.72 kg/m  SpO2: SpO2: 100 % O2 Device: O2 Device: Nasal Cannula O2 Flow Rate:  O2 Flow Rate (L/min): 2 L/min  Intake/output summary:   Intake/Output Summary (Last 24 hours) at 10/06/2017 1150 Last data filed at 10/06/2017 1000 Gross per 24 hour  Intake 1401.33 ml  Output 1550 ml  Net -148.67 ml   LBM: Last BM Date: 10/05/17 Baseline Weight: Weight: 68.9 kg (152 lb) Most recent weight: Weight: 65.3 kg (144 lb)  Palliative Assessment/Data: PPS: 20%    Flowsheet Rows     Most Recent Value  Intake Tab  Referral Department  Hospitalist  Unit at Time of Referral  Med/Surg Unit  Palliative Care Primary Diagnosis  Neurology  Date Notified  09/15/17  Palliative Care Type  New Palliative care  Reason for referral  Clarify Goals of Care  Date of Admission  09/26/2017  Date first seen by Palliative Care  09/16/17  # of days Palliative referral response time  1 Day(s)  # of days IP prior to Palliative referral  10  Clinical Assessment  Psychosocial & Spiritual Assessment  Palliative Care Outcomes      Patient Active Problem List   Diagnosis Date Noted  . Advanced care planning/counseling discussion   . Thrombocytopenia ([place])   . Thrush 09/22/2017  . Fever   . Pancytopenia ([place])   . Atrial fibrillation with RVR ([place])   . AML (acute myeloblastic leukemia) ([place])   . Advance care planning   . Palliative care by specialist   . AML (acute myeloid leukemia) ([place]) 09/12/2017  . Goals of care, counseling/discussion 09/12/2017  . Pressure injury of skin 09/06/2017  . DVT (deep venous thrombosis), left 03/02/2015  . DVT (deep venous thrombosis), unspecified laterality 03/02/2015  . GERD (gastroesophageal reflux disease) 03/02/2015  . Chest pain 04/20/2011  . Cervical radiculopathy 04/20/2011  . Hypertension   . Stroke ([place])   . High cholesterol     Palliative Care Assessment & Plan   Patient Profile: 83 y.o.femalewith past medical history of osteoporosis, hyperlipidemia, occlusive lower left extremity DVT (on Xarelto), CVA,admitted on 6/3/2019with  worsening weakness and fatigue.She was scheduled to be seen by oncology same day of admission but missed the appointment due to weakness. Workup this admission has revealed AML and she has been started on treatment with Dacogen. Palliative medicine consulted for [place].  Assessment/Recommendations/Plan   Patient would be eligible for Hospice if family agreed to stop chemotherapy or if chemotherapy were no longer offered. Family will not stop chemotherapy as long as it is recommended by Leah Lewis  Goals of Care and Additional Recommendations:  Limitations on Scope of Treatment: Full Scope Treatment  Code Status:  Full code  Prognosis:   Unable to determine  Discharge Planning:  To Be Determined  Care plan was discussed with Leah Lewis and patient's RN.  Thank you for allowing the Palliative Medicine Team to assist in the care of this patient.   Time In: 1000 Time Out: 1025 Total Time 25 mins Prolonged Time Billed no      Greater than 50%  of this time was spent counseling and coordinating care related to the above assessment and plan.  Leah Lewis, AGNP-C Palliative Medicine   Please contact Palliative Medicine Team phone at [phone] for questions and concerns.

## 2017-10-07 LAB — CBC WITH DIFFERENTIAL/PLATELET
BASOS PCT: 0 %
BLASTS: 1 %
Basophils Absolute: 0 10*3/uL (ref 0.0–0.1)
EOS ABS: 0 10*3/uL (ref 0.0–0.7)
EOS PCT: 0 %
HEMATOCRIT: 27.3 % — AB (ref 36.0–46.0)
HEMOGLOBIN: 9.1 g/dL — AB (ref 12.0–15.0)
LYMPHS ABS: 0.4 10*3/uL — AB (ref 0.7–4.0)
Lymphocytes Relative: 12 %
MCH: 30.2 pg (ref 26.0–34.0)
MCHC: 33.3 g/dL (ref 30.0–36.0)
MCV: 90.7 fL (ref 78.0–100.0)
Monocytes Absolute: 1 10*3/uL (ref 0.1–1.0)
Monocytes Relative: 27 %
Myelocytes: 2 %
NEUTROS ABS: 2.2 10*3/uL (ref 1.7–7.7)
NEUTROS PCT: 58 %
Platelets: 8 10*3/uL — CL (ref 150–400)
RBC: 3.01 MIL/uL — ABNORMAL LOW (ref 3.87–5.11)
RDW: 15.4 % (ref 11.5–15.5)
WBC: 3.7 10*3/uL — ABNORMAL LOW (ref 4.0–10.5)

## 2017-10-07 LAB — GLUCOSE, CAPILLARY
GLUCOSE-CAPILLARY: 223 mg/dL — AB (ref 70–99)
GLUCOSE-CAPILLARY: 185 mg/dL — AB (ref 70–99)
Glucose-Capillary: 186 mg/dL — ABNORMAL HIGH (ref 70–99)
Glucose-Capillary: 204 mg/dL — ABNORMAL HIGH (ref 70–99)

## 2017-10-07 LAB — BASIC METABOLIC PANEL
Anion gap: 11 (ref 5–15)
BUN: 25 mg/dL — AB (ref 8–23)
CALCIUM: 6.9 mg/dL — AB (ref 8.9–10.3)
CHLORIDE: 101 mmol/L (ref 98–111)
CO2: 27 mmol/L (ref 22–32)
CREATININE: 0.86 mg/dL (ref 0.44–1.00)
GFR calc Af Amer: >60 mL/min (ref >60)
GFR calc non Af Amer: >60 mL/min (ref >60)
GLUCOSE: 209 mg/dL — AB (ref 70–99)
Potassium: 3.8 mmol/L (ref 3.5–5.1)
Sodium: 139 mmol/L (ref 135–145)

## 2017-10-07 LAB — URIC ACID: Uric Acid, Serum: 3.2 mg/dL (ref 2.5–7.1)

## 2017-10-07 LAB — LACTATE DEHYDROGENASE: LDH: 348 U/L — AB (ref 98–192)

## 2017-10-07 LAB — PHOSPHORUS: PHOSPHORUS: 2.9 mg/dL (ref 2.5–4.6)

## 2017-10-07 MED ORDER — SODIUM CHLORIDE 0.9% IV SOLUTION
Freq: Once | INTRAVENOUS | Status: AC
  Administered 2017-10-07: 11:00:00 via INTRAVENOUS

## 2017-10-07 MED ORDER — MAGNESIUM SULFATE 2 GM/50ML IV SOLN
2.0000 g | Freq: Once | INTRAVENOUS | Status: AC
  Administered 2017-10-07: 2 g via INTRAVENOUS
  Filled 2017-10-07: qty 50

## 2017-10-07 MED ORDER — POTASSIUM CHLORIDE CRYS ER 20 MEQ PO TBCR
40.0000 meq | EXTENDED_RELEASE_TABLET | Freq: Every day | ORAL | Status: DC
  Administered 2017-10-07 – 2017-10-11 (×5): 40 meq via ORAL
  Filled 2017-10-07 (×5): qty 2

## 2017-10-07 MED ORDER — MAGNESIUM OXIDE 400 (241.3 MG) MG PO TABS
400.0000 mg | ORAL_TABLET | Freq: Every day | ORAL | Status: DC
  Administered 2017-10-07 – 2017-10-11 (×5): 400 mg via ORAL
  Filled 2017-10-07 (×5): qty 1

## 2017-10-07 MED ORDER — DILTIAZEM HCL-DEXTROSE 100-5 MG/100ML-% IV SOLN (PREMIX)
5.0000 mg/h | INTRAVENOUS | Status: DC
  Administered 2017-10-07: 10 mg/h via INTRAVENOUS
  Administered 2017-10-07 – 2017-10-08 (×2): 5 mg/h via INTRAVENOUS
  Administered 2017-10-08: 15 mg/h via INTRAVENOUS
  Filled 2017-10-07 (×5): qty 100

## 2017-10-07 NOTE — Progress Notes (Signed)
Yuridiana Formanek [place]   DOB:September 18, 1934   HE#:[id number]   NTI#:[id number]  Hematology f/u note   Subjective: [place] is hemodynamically stable, no event overnight, still feels weak, denies any pain or other new complaints.  Her platelet count dropped to 8000 this morning, plan to give platelet transfusion today.  No active bleeding.  No family members at bedside.   Objective:  Vitals:   10/07/17 1450 10/07/17 1630  BP: 101/61 (!) 104/50  Pulse:    Resp: (!) 32 (!) 25  Temp:    SpO2: 95% 93%    Body mass index is 24.2 kg/m.  Intake/Output Summary (Last 24 hours) at 10/07/2017 1646 Last data filed at 10/07/2017 1200 Gross per 24 hour  Intake 432.76 ml  Output 800 ml  Net -367.24 ml     Sclerae unicteric  Oropharynx clear  No peripheral adenopathy  Lungs clear -- no rales or rhonchi  Heart regular rate and rhythm  Abdomen benign  MSK no focal spinal tenderness, no peripheral edema  Neuro nonfocal    CBG (last 3)  Recent Labs    10/07/17 0042 10/07/17 0659 10/07/17 1205  GLUCAP 186* 204* 223*     Labs:  Lab Results  Component Value Date   WBC 3.7 (L) 10/07/2017   HGB 9.1 (L) 10/07/2017   HCT 27.3 (L) 10/07/2017   MCV 90.7 10/07/2017   PLT 8 (LL) 10/07/2017   NEUTROABS 2.2 10/07/2017   CMP Latest Ref Rng & Units 10/07/2017 10/06/2017 10/05/2017  Glucose 70 - 99 mg/dL 209(H) 146(H) 272(H)  BUN 8 - 23 mg/dL 25(H) 21 25(H)  Creatinine 0.44 - 1.00 mg/dL 0.86 0.63 0.78  Sodium 135 - 145 mmol/L 139 142 141  Potassium 3.5 - 5.1 mmol/L 3.8 3.0(L) 3.4(L)  Chloride 98 - 111 mmol/L 101 103 103  CO2 22 - 32 mmol/L 27 30 29   Calcium 8.9 - 10.3 mg/dL 6.9(L) 7.1(L) 6.8(L)  Total Protein 6.5 - 8.1 g/dL - 5.6(L) 5.3(L)  Total Bilirubin 0.3 - 1.2 mg/dL - 3.5(H) 2.6(H)  Alkaline Phos 38 - 126 U/L - 67 62  AST 15 - 41 U/L - 24 27  ALT 0 - 44 U/L - 25 24     Urine Studies No results for input(s): UHGB, CRYS in the last 72 hours.  Invalid input(s): UACOL, UAPR, USPG, UPH, UTP, UGL,  UKET, UBIL, UNIT, UROB, ULEU, UEPI, UWBC, URBC, [place], [place], [place], [place]  Basic Metabolic Panel: Recent Labs  Lab 10/01/17 0428  10/04/17 0427 10/05/17 0517 10/05/17 1841 10/06/17 0316 10/07/17 0411  NA 148*   < > 146* 139 141 142 139  K 2.9*   < > 3.2* 4.5 3.4* 3.0* 3.8  CL 108   < > 108 103 103 103 101  CO2 31   < > 31 29 29 30 27   GLUCOSE 152*   < > 157* 174* 272* 146* 209*  BUN 26*   < > 27* 22 25* 21 25*  CREATININE 0.74   < > 0.75 0.63 0.78 0.63 0.86  CALCIUM 7.3*   < > 7.1* 6.6* 6.8* 7.1* 6.9*  MG 1.4*  --   --   --   --  1.2*  --   PHOS 2.7   < > 2.3* 2.4* 2.7 2.3* 2.9   < > = values in this interval not displayed.   GFR Estimated Creatinine Clearance: 44.3 mL/min (by C-G formula based on SCr of 0.86 mg/dL). Liver Function Tests: Recent Labs  Lab 10/03/17  5009 10/04/17 0427 10/05/17 0517 10/05/17 1841 10/06/17 0316  AST 28 23 22 27 24   ALT 29 26 23 24 25   ALKPHOS 69 59 59 62 67  BILITOT 2.3* 2.3* 2.9* 2.6* 3.5*  PROT 5.3* 5.2* 5.0* 5.3* 5.6*  ALBUMIN 1.5* 1.5* 1.4* 1.5* 1.5*   No results for input(s): LIPASE, AMYLASE in the last 168 hours. No results for input(s): AMMONIA in the last 168 hours. Coagulation profile No results for input(s): INR, PROTIME in the last 168 hours.  CBC: Recent Labs  Lab 10/03/17 0555 10/04/17 0427 10/04/17 2335 10/05/17 0517 10/05/17 1841 10/06/17 0316 10/07/17 0411  WBC 3.3* 3.1* 2.8* 3.1*  --  3.6* 3.7*  NEUTROABS 1.4* 1.4*  --  1.4*  --  1.9 2.2  HGB 7.5* 7.1* 8.6* 7.8* 8.5* 9.2* 9.1*  HCT 23.3* 22.8* 25.9* 23.3* 25.3* 27.4* 27.3*  MCV 94.3 93.4 91.8 91.4  --  89.0 90.7  PLT 18* 11* <5* <5*  --  18* 8*   Cardiac Enzymes: No results for input(s): CKTOTAL, CKMB, CKMBINDEX, TROPONINI in the last 168 hours. BNP: Invalid input(s): POCBNP CBG: Recent Labs  Lab 10/06/17 1141 10/06/17 1706 10/07/17 0042 10/07/17 0659 10/07/17 1205  GLUCAP 233* 205* 186* 204* 223*   D-Dimer No results for input(s): DDIMER in the  last 72 hours. Hgb A1c No results for input(s): HGBA1C in the last 72 hours. Lipid Profile No results for input(s): CHOL, HDL, LDLCALC, TRIG, CHOLHDL, LDLDIRECT in the last 72 hours. Thyroid function studies No results for input(s): TSH, T4TOTAL, T3FREE, THYROIDAB in the last 72 hours.  Invalid input(s): FREET3 Anemia work up No results for input(s): VITAMINB12, FOLATE, FERRITIN, TIBC, IRON, RETICCTPCT in the last 72 hours. Microbiology Recent Results (from the past 240 hour(s))  Culture, blood (routine x 2)     Status: None (Preliminary result)   Collection Time: 10/04/17 12:59 PM  Result Value Ref Range Status   Specimen Description   Final    BLOOD LEFT HAND Performed at [facility] [address] [place], [place] [address]    Special Requests   Final    BOTTLES DRAWN AEROBIC ONLY Blood Culture results may not be optimal due to an inadequate volume of blood received in culture bottles Performed at [facility] [address] [place], [place] [address]    Culture   Final    NO GROWTH 3 DAYS Performed at [facility], [place] [address], [place], [place] [address]    Report Status PENDING  Incomplete  Culture, blood (routine x 2)     Status: None (Preliminary result)   Collection Time: 10/04/17  1:04 PM  Result Value Ref Range Status   Specimen Description   Final    BLOOD LEFT FOREARM Performed at [facility] [address], [place], [place] [address]    Special Requests   Final    BOTTLES DRAWN AEROBIC ONLY Blood Culture results may not be optimal due to an inadequate volume of blood received in culture bottles Performed at [facility] [address] [place], [place] [address]    Culture   Final    NO GROWTH 3 DAYS Performed at [facility], [place] [address], [place], [place] [address]    Report Status PENDING  Incomplete      Studies:  No results found.  Assessment: 83  y.o. AAF with newly diagnosed AML on admission 09/07/2017, and has been in [facility]  hospital for a month now   1. AML with complex cytogenetics, s/p 1 cycle of decitabine 6/12-6/17, and venetoclax (held for now due to cytopenia) 2.  Pancytopenia due to AML and chemo effect, requires frequent blood and platelet transfusion 3.  Afib with RVR 4.  Moderate protein and calorie malnutrition   Plan:  -Will continue holding Venetoclax through the weekend due to her significant cytopenia  -continue blood transfusion (irradiated blood products, prefer single donor plt if possible) to keep Hg>=8 and plt>=10K, she will receive one unit plt today  -very poor prognosis, she is not a candidate for intensive chemo or bone marrow transplant, not curable, the goal of care is palliative. I am very skeptical that she can tolerate more treatment. I think comfort care is the best for her.   -I tried calling her daughters Albertina Parr (left a message) and Magaret (not able to leave a message) but could not get hold of them.  -I have tried to discuss with pt directly, but she has dementia (per her nurse), not sure how much she can understand the complex situation, but she states "I want to go home", also told me that she will talk to her daughters. -I will suggest Dr. Marin Olp to have [place] discussion with pt and her daughters on Monday when he returns. I will send a message to him.  -My partner Dr. Jana Hakim is on call this weekend, please call him if needed.   Truitt Merle, MD 10/07/2017  4:46 PM

## 2017-10-07 NOTE — Progress Notes (Signed)
PROGRESS NOTE    Leah Lewis  IZT:[id number] DOB: 1935/01/04 DOA: 09/07/2017 PCP: Wenda Low, MD   Brief Narrative:  Patient is a 83 year old female with past medical history of osteoporosis, hyperlipidemia, DVT who presented with generalized weakness and found to have pancytopenia secondary to AML.  She has received multiple PRBC and platelet transfusion during this admission.  Her hospital course was remarkable for development of A. fib with RVR.  Oncology is following and she had been started on chemotherapy.  Patient has ongoing issues with pancytopenia: anemia and thrombocytopenia.  Though patient has very poor prognosis, family wants to continue treatment and chemotherapy.  Palliative care was also following.  Plan is to discuss with family again for discussion of goals of care.  Assessment & Plan:   Active Problems:   Pressure injury of skin   AML (acute myeloid leukemia) (HCC)   Goals of care, counseling/discussion   AML (acute myeloblastic leukemia) ([facility])   Advance care planning   Palliative care by specialist   Fever   Pancytopenia ([facility])   Atrial fibrillation with RVR ([facility])   Thrush   Thrombocytopenia ([facility])   Advanced care planning/counseling discussion  AML: Started on chemotherapy on 6/24.  Grave prognosis.  Received first cycle of Dacogen from 6/12 to 6/17.  Plan for his second cycle of Dacogen on 7/11 if she remains stable.  Was on  venetoclax which has been discontinued due to thrombocytopenia. Oncology to discuss with family again. She follows with Dr. Marin Olp as an outpatient. I have discussed with the daughter Ms. [name] today.  She is interested on discussion again regarding goals of care face-to-face.  Anemia/thrombocytopenia:  Hb this morning was 9.1 and platelets were 8 . We will transfuse her with 1 unit of platelets today .whenever indicated ,she  should be  transfused with irradiated, CMV negative blood.  Total of 8 units of PRBC and 13 units of  platelets so far.  A. fib with RVR: Went into A. fib with RVR last night and was started on Cardizem drip.  Not on anti-coagulation due to thrombocytopenia.  BP stable today.  Cardiology was consulted but have signed off again.  Cardiology did not want to start her on  amiodarone drip or digoxin.  Recommended continuing metoprolol.  Right middle lobe pneumonia:  She had completed the course of antibiotics  Oral thrush: Continue nystatin.  Acute respiratory failure with hypoxia: In the setting of pneumonia.  Pneumonia was treated with antibiotics.  Last chest x-ray showed improved infiltrates.  Respiratory status has improved.  Diabetes mellitus: Continue sliding scale insulin.  Hypokalemia/hypomagnesemia: Currently being supplemented.  Malnutrition: Nutrition team following.  Nutrition team was recommending feeding tube.  Unsafe condition to place feeding tube due to severe thrombocytopenia.  Essential hypertension: Currently BP soft.  On PRN metoprolol.   We will try to discuss with family about goals of care and how to move from this point.  Patient looks terminally ill and is a hospice candidate.  I have discussed with her daughter [name] on phone and she is interested again to talk face-to-face regarding goals of treatment with oncology and palliative care.  If we cannot get into a conclusion, we will talk to Dr. Marin Olp on Monday.  DVT prophylaxis:SCD Code Status: Full Family Communication: Daughter on phone Disposition Plan: Unknown at this point   Consultants: Oncology, cardiology  Procedures: None  Antimicrobials: None  Subjective: Patient seen and examined the bedside this morning.  Remains weak.  She went  into A. fib with RVR last night and was started on Cardizem drip.  Blood pressure this morning was soft but acceptable.  Objective: Vitals:   10/07/17 0500 10/07/17 0603 10/07/17 0800 10/07/17 0900  BP:  (!) 86/62 (!) 107/57 112/81  Pulse:      Resp:  (!) 36  (!) 32   Temp:   97.7 F (36.5 C)   TempSrc:   Oral   SpO2:  96% 98% 95%  Weight: 64 kg (141 lb)     Height:        Intake/Output Summary (Last 24 hours) at 10/07/2017 1004 Last data filed at 10/07/2017 0600 Gross per 24 hour  Intake 152.76 ml  Output 350 ml  Net -197.24 ml   Filed Weights   10/02/17 0400 10/03/17 0457 10/07/17 0500  Weight: 63 kg (139 lb) 65.3 kg (144 lb) 64 kg (141 lb)    Examination:   General exam: Chronically ill looking, generalized weakness HEENT:PERRL,Oral mucosa moist, Ear/Nose normal on gross exam Respiratory system: Bilateral equal air entry, normal vesicular breath sounds, no wheezes or crackles  Cardiovascular system: A. fib with RVR . No JVD, murmurs, rubs, gallops or clicks. Gastrointestinal system: Abdomen is nondistended, soft and nontender. No organomegaly or masses felt. Normal bowel sounds heard. Central nervous system: Alert , oriented only to self. No focal neurological deficits. Extremities: No edema, no clubbing ,no cyanosis, distal peripheral pulses palpable. Skin: No rashes, lesions or ulcers,no icterus ,no pallor   Data Reviewed: I have personally reviewed following labs and imaging studies  CBC: Recent Labs  Lab 10/03/17 0555 10/04/17 0427 10/04/17 2335 10/05/17 0517 10/05/17 1841 10/06/17 0316 10/07/17 0411  WBC 3.3* 3.1* 2.8* 3.1*  --  3.6* 3.7*  NEUTROABS 1.4* 1.4*  --  1.4*  --  1.9 2.2  HGB 7.5* 7.1* 8.6* 7.8* 8.5* 9.2* 9.1*  HCT 23.3* 22.8* 25.9* 23.3* 25.3* 27.4* 27.3*  MCV 94.3 93.4 91.8 91.4  --  89.0 90.7  PLT 18* 11* <5* <5*  --  18* 8*   Basic Metabolic Panel: Recent Labs  Lab 10/01/17 0428  10/04/17 0427 10/05/17 0517 10/05/17 1841 10/06/17 0316 10/07/17 0411  NA 148*   < > 146* 139 141 142 139  K 2.9*   < > 3.2* 4.5 3.4* 3.0* 3.8  CL 108   < > 108 103 103 103 101  CO2 31   < > 31 29 29 30 27   GLUCOSE 152*   < > 157* 174* 272* 146* 209*  BUN 26*   < > 27* 22 25* 21 25*  CREATININE 0.74   < > 0.75  0.63 0.78 0.63 0.86  CALCIUM 7.3*   < > 7.1* 6.6* 6.8* 7.1* 6.9*  MG 1.4*  --   --   --   --  1.2*  --   PHOS 2.7   < > 2.3* 2.4* 2.7 2.3* 2.9   < > = values in this interval not displayed.   GFR: Estimated Creatinine Clearance: 44.3 mL/min (by C-G formula based on SCr of 0.86 mg/dL). Liver Function Tests: Recent Labs  Lab 10/03/17 0555 10/04/17 0427 10/05/17 0517 10/05/17 1841 10/06/17 0316  AST 28 23 22 27 24   ALT 29 26 23 24 25   ALKPHOS 69 59 59 62 67  BILITOT 2.3* 2.3* 2.9* 2.6* 3.5*  PROT 5.3* 5.2* 5.0* 5.3* 5.6*  ALBUMIN 1.5* 1.5* 1.4* 1.5* 1.5*   No results for input(s): LIPASE, AMYLASE in the last 168 hours.  No results for input(s): AMMONIA in the last 168 hours. Coagulation Profile: No results for input(s): INR, PROTIME in the last 168 hours. Cardiac Enzymes: No results for input(s): CKTOTAL, CKMB, CKMBINDEX, TROPONINI in the last 168 hours. BNP (last 3 results) No results for input(s): PROBNP in the last 8760 hours. HbA1C: No results for input(s): HGBA1C in the last 72 hours. CBG: Recent Labs  Lab 10/06/17 0606 10/06/17 1141 10/06/17 1706 10/07/17 0042 10/07/17 0659  GLUCAP 158* 233* 205* 186* 204*   Lipid Profile: No results for input(s): CHOL, HDL, LDLCALC, TRIG, CHOLHDL, LDLDIRECT in the last 72 hours. Thyroid Function Tests: No results for input(s): TSH, T4TOTAL, FREET4, T3FREE, THYROIDAB in the last 72 hours. Anemia Panel: No results for input(s): VITAMINB12, FOLATE, FERRITIN, TIBC, IRON, RETICCTPCT in the last 72 hours. Sepsis Labs: No results for input(s): PROCALCITON, LATICACIDVEN in the last 168 hours.  Recent Results (from the past 240 hour(s))  Culture, blood (routine x 2)     Status: None (Preliminary result)   Collection Time: 10/04/17 12:59 PM  Result Value Ref Range Status   Specimen Description   Final    BLOOD LEFT HAND Performed at [facility] [address], [place], [place] [address]    Special Requests    Final    BOTTLES DRAWN AEROBIC ONLY Blood Culture results may not be optimal due to an inadequate volume of blood received in culture bottles Performed at [facility] [address] [place], [place] [address]    Culture   Final    NO GROWTH 2 DAYS Performed at [facility] [address] [place], [place] [address]    Report Status PENDING  Incomplete  Culture, blood (routine x 2)     Status: None (Preliminary result)   Collection Time: 10/04/17  1:04 PM  Result Value Ref Range Status   Specimen Description   Final    BLOOD LEFT FOREARM Performed at [facility] [address], [place], [place] [address]    Special Requests   Final    BOTTLES DRAWN AEROBIC ONLY Blood Culture results may not be optimal due to an inadequate volume of blood received in culture bottles Performed at [facility] [address], [place], [place] [address]    Culture   Final    NO GROWTH 2 DAYS Performed at [facility] [address], [place], [place] [address]    Report Status PENDING  Incomplete         Radiology Studies: No results found.      Scheduled Meds: . sodium chloride   Intravenous Once  . Chlorhexidine Gluconate Cloth  6 each Topical Daily  . collagenase   Topical Daily  . digoxin  0.25 mg Intravenous Once  . feeding supplement (ENSURE ENLIVE)  237 mL Oral TID BM  . insulin aspart  0-9 Units Subcutaneous Q6H  . ketorolac  1 drop Both Eyes QID  . lidocaine  1 patch Transdermal QAC breakfast  . magnesium oxide  400 mg Oral Daily  . mouth rinse  15 mL Mouth Rinse BID  . mirtazapine  15 mg Oral QHS  . multivitamin with minerals  1 tablet Oral Daily  . nystatin  5 mL Oral Q8H  . polyethylene glycol  17 g Oral BID  . potassium chloride  40 mEq Oral Daily  . prednisoLONE acetate  1 drop Both Eyes QID  . sodium chloride flush  10-40  mL Intracatheter Q12H   Continuous Infusions: . diltiazem (CARDIZEM)  infusion 10 mg/hr (10/07/17 0818)  . magnesium sulfate 1 - 4 g bolus IVPB 2 g (10/07/17 0921)     LOS: 31 days    Time spent: 25 mins.More than 50% of that time was spent in counseling and/or coordination of care.      Shelly Coss, MD [facility] Pager [phone]  If 7PM-7AM, please contact night-coverage [email or web address] Password [email or web address] 10/07/2017, 10:04 AM

## 2017-10-07 NOTE — Progress Notes (Signed)
Palliative note: No charge.  I called patient's daughter- Leah Lewis in an attempt to arrange face to face [place] meeting. She stated she was only interested in meeting with Dr. Marin Olp when he returned to his office due to the fact that he had been following her for so long and had started her on his chemotherapy. Patient's other daughter- Leah Lewis had also previously indicated she was not interested in changing [place] until it was recommended by Dr. Marin Olp.  At this point, I do not think patient's daughters' GOC will change until it is recommended by Dr. Marin Olp. I would recommend [facility] discussion lead by Dr. Marin Olp.   Leah Lewis, AGNP-C Palliative Medicine  Please call Palliative Medicine team phone with any questions [phone]. For individual providers please see AMION.

## 2017-10-08 LAB — CBC WITH DIFFERENTIAL/PLATELET
Band Neutrophils: 14 %
Basophils Absolute: 0 K/uL (ref 0.0–0.1)
Basophils Relative: 2 %
Eosinophils Absolute: 0 K/uL (ref 0.0–0.7)
Eosinophils Relative: 0 %
HCT: 25.2 % — ABNORMAL LOW (ref 36.0–46.0)
Hemoglobin: 8.4 g/dL — ABNORMAL LOW (ref 12.0–15.0)
Lymphocytes Relative: 12 %
Lymphs Abs: 0.3 K/uL — ABNORMAL LOW (ref 0.7–4.0)
MCH: 30.1 pg (ref 26.0–34.0)
MCHC: 33.3 g/dL (ref 30.0–36.0)
MCV: 90.3 fL (ref 78.0–100.0)
Metamyelocytes Relative: 4 %
Monocytes Absolute: 0.4 K/uL (ref 0.1–1.0)
Monocytes Relative: 18 %
Neutro Abs: 1.5 K/uL — ABNORMAL LOW (ref 1.7–7.7)
Neutrophils Relative %: 50 %
Platelets: 10 K/uL — CL (ref 150–400)
RBC: 2.79 MIL/uL — ABNORMAL LOW (ref 3.87–5.11)
RDW: 15.1 % (ref 11.5–15.5)
WBC: 2.2 K/uL — ABNORMAL LOW (ref 4.0–10.5)

## 2017-10-08 LAB — GLUCOSE, CAPILLARY
GLUCOSE-CAPILLARY: 275 mg/dL — AB (ref 70–99)
GLUCOSE-CAPILLARY: 275 mg/dL — AB (ref 70–99)
Glucose-Capillary: 141 mg/dL — ABNORMAL HIGH (ref 70–99)
Glucose-Capillary: 199 mg/dL — ABNORMAL HIGH (ref 70–99)
Glucose-Capillary: 293 mg/dL — ABNORMAL HIGH (ref 70–99)

## 2017-10-08 LAB — OCCULT BLOOD X 1 CARD TO LAB, STOOL: Fecal Occult Bld: NEGATIVE

## 2017-10-08 LAB — URIC ACID: Uric Acid, Serum: 3.8 mg/dL (ref 2.5–7.1)

## 2017-10-08 LAB — BASIC METABOLIC PANEL WITH GFR
Anion gap: 9 (ref 5–15)
BUN: 31 mg/dL — ABNORMAL HIGH (ref 8–23)
CO2: 28 mmol/L (ref 22–32)
Calcium: 6.6 mg/dL — ABNORMAL LOW (ref 8.9–10.3)
Chloride: 102 mmol/L (ref 98–111)
Creatinine, Ser: 0.77 mg/dL (ref 0.44–1.00)
GFR calc Af Amer: >60 mL/min
GFR calc non Af Amer: >60 mL/min
Glucose, Bld: 200 mg/dL — ABNORMAL HIGH (ref 70–99)
Potassium: 3.2 mmol/L — ABNORMAL LOW (ref 3.5–5.1)
Sodium: 139 mmol/L (ref 135–145)

## 2017-10-08 LAB — LACTATE DEHYDROGENASE: LDH: 303 U/L — ABNORMAL HIGH (ref 98–192)

## 2017-10-08 LAB — PHOSPHORUS: Phosphorus: 2.8 mg/dL (ref 2.5–4.6)

## 2017-10-08 MED ORDER — POTASSIUM CHLORIDE CRYS ER 20 MEQ PO TBCR
40.0000 meq | EXTENDED_RELEASE_TABLET | Freq: Once | ORAL | Status: AC
  Administered 2017-10-08: 40 meq via ORAL
  Filled 2017-10-08: qty 2

## 2017-10-08 NOTE — Progress Notes (Signed)
PROGRESS NOTE    Leah Lewis  IRS:[id number] DOB: 16-Sep-1934 DOA: 10/02/2017 PCP: Leah Low, MD   Brief Narrative:  Patient is a 83 year old female with past medical history of osteoporosis, hyperlipidemia, DVT who presented with generalized weakness and found to have pancytopenia secondary to AML.  She has received multiple PRBC and platelet transfusion during this admission.  Her hospital course was remarkable for development of A. fib with RVR.  Oncology is following and she had been started on chemotherapy.  Patient has ongoing issues with pancytopenia: anemia and thrombocytopenia.  Though patient has very poor prognosis, family wants to continue treatment and chemotherapy.  Palliative care was also following.  Plan is to discuss with family again for discussion of goals of care.Waiting for Dr. Antonieta Lewis input.  Assessment & Plan:   Active Problems:   Pressure injury of skin   AML (acute myeloid leukemia) (HCC)   Goals of care, counseling/discussion   AML (acute myeloblastic leukemia) ([facility])   Advance care planning   Palliative care by specialist   Fever   Pancytopenia ([facility])   Atrial fibrillation with RVR ([facility])   Thrush   Thrombocytopenia ([facility])   Advanced care planning/counseling discussion  AML: Started on chemotherapy on 6/24.  Grave prognosis.  Received first cycle of Dacogen from 6/12 to 6/17.  Plan for his second cycle of Dacogen on 7/11 if she remains stable.  Was on  venetoclax which has been discontinued due to thrombocytopenia. Oncology to discuss with family again. She follows with Dr. Marin Lewis as an outpatient. I have discussed with the daughter Ms. Leah Lewis .  She is interested on discussion with Dr. Marin Lewis again regarding goals of care face-to-face.  Anemia/thrombocytopenia:  Hb this morning was 8.4 and platelets were 10 .Whenever indicated ,she  should be  transfused with irradiated, CMV negative blood.  Total of 8 units of PRBC and 13 units of platelets so  far.  A. fib with RVR: Still on  A. fib with RVR last night and she has been started on Cardizem drip.  Not on anti-coagulation due to thrombocytopenia.  BP stable today.  Cardiology was consulted but have signed off .  Cardiology did not want to start her on  amiodarone drip or digoxin.  Recommended continuing IV metoprolol.  Right middle lobe pneumonia:  She had completed the course of antibiotics  Oral thrush: Continue nystatin.  Acute respiratory failure with hypoxia: In the setting of pneumonia.  Pneumonia was treated with antibiotics.  Last chest x-ray showed improved infiltrates.  Respiratory status has improved.  Diabetes mellitus: Continue sliding scale insulin.  Hypokalemia/hypomagnesemia: Currently being supplemented.  Malnutrition: Nutrition team following.  Nutrition team was recommending feeding tube.  Unsafe condition to place feeding tube due to severe thrombocytopenia.  Essential hypertension: Currently BP soft.  On PRN metoprolol.   We will try to discuss with family about goals of care and how to move from this point.  Patient looks terminally ill and is a hospice candidate.  I have discussed with her daughter Ms. Leah Lewis on phone and she is interested again to talk face-to-face regarding goals of treatment with oncology,Dr. Marin Lewis on Monday.  DVT prophylaxis:SCD Code Status: Full Family Communication: Daughter on phone Disposition Plan: Unknown at this point   Consultants: Oncology, cardiology  Procedures: None  Antimicrobials: None  Subjective: Patient seen and examined the bedside this morning.  Remains weak.  She is still on  A. fib with RVR and  on Cardizem drip.  Blood pressure this  morning was soft but acceptable.  Objective: Vitals:   10/08/17 0500 10/08/17 0800 10/08/17 0900 10/08/17 1000  BP:  (!) 102/49 (!) 98/55 (!) 87/61  Pulse:  (!) 121    Resp:  (!) 37 (!) 32 (!) 35  Temp:  98.8 F (37.1 C)    TempSrc:  Axillary    SpO2:  96% 98% 92%   Weight: 64.9 kg (143 lb)     Height:        Intake/Output Summary (Last 24 hours) at 10/08/2017 1036 Last data filed at 10/08/2017 1000 Gross per 24 hour  Intake 1044.96 ml  Output 900 ml  Net 144.96 ml   Filed Weights   10/03/17 0457 10/07/17 0500 10/08/17 0500  Weight: 65.3 kg (144 lb) 64 kg (141 lb) 64.9 kg (143 lb)    Examination:   General exam: Chronically ill looking, generalized weakness HEENT:PERRL,Oral mucosa moist, Ear/Nose normal on gross exam Respiratory system: Bilateral equal air entry, normal vesicular breath sounds, no wheezes or crackles  Cardiovascular system: A. fib with RVR . No JVD, murmurs, rubs, gallops or clicks. Gastrointestinal system: Abdomen is nondistended, soft and nontender. No organomegaly or masses felt. Normal bowel sounds heard. Central nervous system: Alert , oriented only to self. No focal neurological deficits. Extremities: No edema, no clubbing ,no cyanosis, distal peripheral pulses palpable. Skin: No rashes, lesions or ulcers,no icterus ,no pallor   Data Reviewed: I have personally reviewed following labs and imaging studies  CBC: Recent Labs  Lab 10/04/17 0427 10/04/17 2335 10/05/17 0517 10/05/17 1841 10/06/17 0316 10/07/17 0411 10/08/17 0451  WBC 3.1* 2.8* 3.1*  --  3.6* 3.7* 2.2*  NEUTROABS 1.4*  --  1.4*  --  1.9 2.2 1.5*  HGB 7.1* 8.6* 7.8* 8.5* 9.2* 9.1* 8.4*  HCT 22.8* 25.9* 23.3* 25.3* 27.4* 27.3* 25.2*  MCV 93.4 91.8 91.4  --  89.0 90.7 90.3  PLT 11* <5* <5*  --  18* 8* 10*   Basic Metabolic Panel: Recent Labs  Lab 10/05/17 0517 10/05/17 1841 10/06/17 0316 10/07/17 0411 10/08/17 0451  NA 139 141 142 139 139  K 4.5 3.4* 3.0* 3.8 3.2*  CL 103 103 103 101 102  CO2 29 29 30 27 28   GLUCOSE 174* 272* 146* 209* 200*  BUN 22 25* 21 25* 31*  CREATININE 0.63 0.78 0.63 0.86 0.77  CALCIUM 6.6* 6.8* 7.1* 6.9* 6.6*  MG  --   --  1.2*  --   --   PHOS 2.4* 2.7 2.3* 2.9 2.8   GFR: Estimated Creatinine Clearance: 47.6  mL/min (by C-G formula based on SCr of 0.77 mg/dL). Liver Function Tests: Recent Labs  Lab 10/03/17 0555 10/04/17 0427 10/05/17 0517 10/05/17 1841 10/06/17 0316  AST 28 23 22 27 24   ALT 29 26 23 24 25   ALKPHOS 69 59 59 62 67  BILITOT 2.3* 2.3* 2.9* 2.6* 3.5*  PROT 5.3* 5.2* 5.0* 5.3* 5.6*  ALBUMIN 1.5* 1.5* 1.4* 1.5* 1.5*   No results for input(s): LIPASE, AMYLASE in the last 168 hours. No results for input(s): AMMONIA in the last 168 hours. Coagulation Profile: No results for input(s): INR, PROTIME in the last 168 hours. Cardiac Enzymes: No results for input(s): CKTOTAL, CKMB, CKMBINDEX, TROPONINI in the last 168 hours. BNP (last 3 results) No results for input(s): PROBNP in the last 8760 hours. HbA1C: No results for input(s): HGBA1C in the last 72 hours. CBG: Recent Labs  Lab 10/07/17 0659 10/07/17 1205 10/07/17 1750 10/08/17 0010 10/08/17  0539  GLUCAP 204* 223* 185* 199* 141*   Lipid Profile: No results for input(s): CHOL, HDL, LDLCALC, TRIG, CHOLHDL, LDLDIRECT in the last 72 hours. Thyroid Function Tests: No results for input(s): TSH, T4TOTAL, FREET4, T3FREE, THYROIDAB in the last 72 hours. Anemia Panel: No results for input(s): VITAMINB12, FOLATE, FERRITIN, TIBC, IRON, RETICCTPCT in the last 72 hours. Sepsis Labs: No results for input(s): PROCALCITON, LATICACIDVEN in the last 168 hours.  Recent Results (from the past 240 hour(s))  Culture, blood (routine x 2)     Status: None (Preliminary result)   Collection Time: 10/04/17 12:59 PM  Result Value Ref Range Status   Specimen Description   Final    BLOOD LEFT HAND Performed at [facility] [address], [place], [place] [address]    Special Requests   Final    BOTTLES DRAWN AEROBIC ONLY Blood Culture results may not be optimal due to an inadequate volume of blood received in culture bottles Performed at [facility] [address] [place], [place] [address]     Culture   Final    NO GROWTH 4 DAYS Performed at [facility], [place] [address] [place], [place] [address]    Report Status PENDING  Incomplete  Culture, blood (routine x 2)     Status: None (Preliminary result)   Collection Time: 10/04/17  1:04 PM  Result Value Ref Range Status   Specimen Description   Final    BLOOD LEFT FOREARM Performed at [facility] [address] [place], [place] [address]    Special Requests   Final    BOTTLES DRAWN AEROBIC ONLY Blood Culture results may not be optimal due to an inadequate volume of blood received in culture bottles Performed at [facility] [address] [place], [place] [address]    Culture   Final    NO GROWTH 4 DAYS Performed at [facility], [place] [address] [place], [place] [address]    Report Status PENDING  Incomplete         Radiology Studies: No results found.      Scheduled Meds: . Chlorhexidine Gluconate Cloth  6 each Topical Daily  . collagenase   Topical Daily  . digoxin  0.25 mg Intravenous Once  . feeding supplement (ENSURE ENLIVE)  237 mL Oral TID BM  . insulin aspart  0-9 Units Subcutaneous Q6H  . ketorolac  1 drop Both Eyes QID  . lidocaine  1 patch Transdermal QAC breakfast  . magnesium oxide  400 mg Oral Daily  . mouth rinse  15 mL Mouth Rinse BID  . mirtazapine  15 mg Oral QHS  . multivitamin with minerals  1 tablet Oral Daily  . nystatin  5 mL Oral Q8H  . polyethylene glycol  17 g Oral BID  . potassium chloride  40 mEq Oral Daily  . prednisoLONE acetate  1 drop Both Eyes QID  . sodium chloride flush  10-40 mL Intracatheter Q12H   Continuous Infusions: . diltiazem (CARDIZEM) infusion 15 mg/hr (10/08/17 0937)     LOS: 32 days    Time spent: 25 mins.More than 50% of that time was spent in counseling and/or coordination of care.      Shelly Coss, MD [facility] Pager [phone]  If 7PM-7AM, please contact  night-coverage [email or web address] Password [email or web address] 10/08/2017, 10:36 AM

## 2017-10-08 NOTE — Progress Notes (Signed)
COURTESY NOTE: Stopped by to introduce myself to this 83 y/o [place] woman diagnosed with acute myeloid leukemia, with 5Q minus phenotype, currrently day 24 cycle 1 of decitabine, with continuing cytopenias.  The patient knows she's in the hospital ("[facility]"), tells me it's 2009 and that Obama is president. No family in room.   Given the patient's poor performance status I think a DNR order and consideration of comfort care is appropriate. However, as per Dr Ernestina Penna note, family wishes to decide on those issues only after discussing with Dr Marin Olp.  Please let me know if we can be of help before his return.

## 2017-10-09 LAB — CBC WITH DIFFERENTIAL/PLATELET
BASOS ABS: 0 10*3/uL (ref 0.0–0.1)
Basophils Relative: 0 %
Eosinophils Absolute: 0 10*3/uL (ref 0.0–0.7)
Eosinophils Relative: 0 %
HCT: 24.5 % — ABNORMAL LOW (ref 36.0–46.0)
HEMOGLOBIN: 8.2 g/dL — AB (ref 12.0–15.0)
LYMPHS PCT: 12 %
Lymphs Abs: 0.3 10*3/uL — ABNORMAL LOW (ref 0.7–4.0)
MCH: 30.4 pg (ref 26.0–34.0)
MCHC: 33.5 g/dL (ref 30.0–36.0)
MCV: 90.7 fL (ref 78.0–100.0)
MONOS PCT: 30 %
Monocytes Absolute: 0.8 10*3/uL (ref 0.1–1.0)
NEUTROS ABS: 1.4 10*3/uL — AB (ref 1.7–7.7)
NEUTROS PCT: 58 %
Platelets: 9 10*3/uL — CL (ref 150–400)
RBC: 2.7 MIL/uL — AB (ref 3.87–5.11)
RDW: 15.1 % (ref 11.5–15.5)
WBC: 2.5 10*3/uL — ABNORMAL LOW (ref 4.0–10.5)

## 2017-10-09 LAB — CULTURE, BLOOD (ROUTINE X 2)
CULTURE: NO GROWTH
CULTURE: NO GROWTH

## 2017-10-09 LAB — BASIC METABOLIC PANEL
ANION GAP: 9 (ref 5–15)
BUN: 46 mg/dL — ABNORMAL HIGH (ref 8–23)
CHLORIDE: 101 mmol/L (ref 98–111)
CO2: 28 mmol/L (ref 22–32)
Calcium: 6.9 mg/dL — ABNORMAL LOW (ref 8.9–10.3)
Creatinine, Ser: 0.98 mg/dL (ref 0.44–1.00)
GFR, EST NON AFRICAN AMERICAN: 53 mL/min — AB (ref >60)
Glucose, Bld: 261 mg/dL — ABNORMAL HIGH (ref 70–99)
POTASSIUM: 4.4 mmol/L (ref 3.5–5.1)
SODIUM: 138 mmol/L (ref 135–145)

## 2017-10-09 LAB — BPAM PLATELET PHERESIS
Blood Product Expiration Date: 201907062359
ISSUE DATE / TIME: 201907051010
UNIT TYPE AND RH: 6200

## 2017-10-09 LAB — PREPARE PLATELET PHERESIS: Unit division: 0

## 2017-10-09 LAB — GLUCOSE, CAPILLARY
GLUCOSE-CAPILLARY: 183 mg/dL — AB (ref 70–99)
GLUCOSE-CAPILLARY: 213 mg/dL — AB (ref 70–99)
GLUCOSE-CAPILLARY: 251 mg/dL — AB (ref 70–99)
GLUCOSE-CAPILLARY: 259 mg/dL — AB (ref 70–99)

## 2017-10-09 MED ORDER — SODIUM CHLORIDE 0.9 % IV SOLN
INTRAVENOUS | Status: DC
  Administered 2017-10-09: 100 mL/h via INTRAVENOUS
  Administered 2017-10-10 – 2017-10-11 (×2): via INTRAVENOUS

## 2017-10-09 MED ORDER — SODIUM CHLORIDE 0.9 % IV BOLUS
1000.0000 mL | Freq: Once | INTRAVENOUS | Status: AC
  Administered 2017-10-09: 1000 mL via INTRAVENOUS

## 2017-10-09 MED ORDER — SODIUM CHLORIDE 0.9% IV SOLUTION
Freq: Once | INTRAVENOUS | Status: AC
  Administered 2017-10-09: 10 mL/h via INTRAVENOUS

## 2017-10-09 NOTE — Progress Notes (Signed)
COURTESY NOTE: Ms. Leah Lewis is completely wrapped up in a blanket, complaining of chills.  She is much more alert and conversant today than yesterday.  There is no family in room.  The big question on this patient is whether to proceed to palliative/comfort care or consider more aggressive treatment.  The family only wants to discuss this with Dr. Marin Olp which is appropriate since he is her primary oncologist.  He will be back tomorrow to clarify these issues.  Please let me know if I can be of further help at this point

## 2017-10-09 NOTE — Progress Notes (Signed)
Pt's blood pressure is low, cardizem gtt stopped. Heart rate is 80'-90's afib. Notified Dr Tawanna Solo, order for normal saline bolus given and for normal saline maintenance fluid given. Pt alert and able to answer questions. Pt does not have much of an appetite but will drink small sips of ginger ale.

## 2017-10-09 NOTE — Progress Notes (Signed)
PROGRESS NOTE    Leah Lewis  WUJ:[id number] DOB: 26-Oct-1934 DOA: 09/29/2017 PCP: Wenda Low, MD   Brief Narrative:  Patient is a 83 year old female with past medical history of osteoporosis, hyperlipidemia, DVT who presented with generalized weakness and found to have pancytopenia secondary to AML.  She has received multiple PRBC and platelet transfusion during this admission.  Her hospital course was remarkable for development of A. fib with RVR.  Oncology is following and she had been started on chemotherapy.  Patient has ongoing issues with pancytopenia: anemia and thrombocytopenia.  Though patient has very poor prognosis, family wants to continue treatment and chemotherapy.  Palliative care was also following.  Plan is to discuss with family again for discussion of goals of care.Waiting for Dr. Antonieta Pert input.  Assessment & Plan:   Active Problems:   Pressure injury of skin   AML (acute myeloid leukemia) (HCC)   Goals of care, counseling/discussion   AML (acute myeloblastic leukemia) ([place])   Advance care planning   Palliative care by specialist   Fever   Pancytopenia ([place])   Atrial fibrillation with RVR ([place])   Thrush   Thrombocytopenia ([place])   Advanced care planning/counseling discussion  AML: Started on chemotherapy on 6/24.  Grave prognosis.  Received first cycle of Dacogen from 6/12 to 6/17.  Plan for his second cycle of Dacogen on 7/11 if she remains stable.  Was on  venetoclax which has been discontinued due to thrombocytopenia. Oncology to discuss with family again. She follows with Dr. Marin Olp as an outpatient. I have discussed with the daughter [name] .  She is interested on discussion with Dr. Marin Olp again regarding goals of care face-to-face.  Anemia/thrombocytopenia/leucopenia:  Hb this morning was 8.2 and platelets were 9 .We will transfuse her with 1 unit of platelets.  Whenever indicated ,she  should be  transfused with irradiated, CMV negative blood.   Total of 8 units of PRBC and 14 units of platelets so far.  A. fib with RVR: On on  A. fib with RVR last night and she has been started on Cardizem drip.  HR better today.Not on anti-coagulation due to thrombocytopenia.  BP stable today.  Cardiology was consulted but have signed off .  Cardiology did not want to start her on  amiodarone drip or digoxin.  Recommended continuing IV metoprolol.  Right middle lobe pneumonia:  She had completed the course of antibiotics  Oral thrush: Continue nystatin.  Acute respiratory failure with hypoxia: In the setting of pneumonia.  Pneumonia was treated with antibiotics.  Last chest x-ray showed improved infiltrates.  Respiratory status has improved.  Diabetes mellitus: Continue sliding scale insulin.  Hypokalemia/hypomagnesemia: Currently being supplemented.  Malnutrition: Nutrition team following.  Nutrition team was recommending feeding tube.  Unsafe condition to place feeding tube due to severe thrombocytopenia.  Essential hypertension: Currently BP soft.  On PRN metoprolol.   We will try to discuss with family about goals of care and how to move from this point.  Patient looks terminally ill and is a hospice candidate.  I have discussed with her daughter Ms. [name] on phone and she is interested again to talk face-to-face regarding goals of treatment with oncology,Dr. Marin Olp on Monday.  DVT prophylaxis:SCD Code Status: Full Family Communication: Daughter on phone Disposition Plan: Unknown at this point   Consultants: Oncology, cardiology  Procedures: None  Antimicrobials: None  Subjective: Patient seen and examined the bedside this morning.  Remains weak.  She is still on  A.  fib with RVR and  on Cardizem drip.But HR better controlled today.  Blood pressure this morning was soft but acceptable.  Objective: Vitals:   10/09/17 0700 10/09/17 0800 10/09/17 0900 10/09/17 1000  BP: (!) 97/59 102/64 (!) 97/55 (!) 125/99  Pulse:    99  Resp:  (!) 38 (!) 31 19 (!) 21  Temp:  97.6 F (36.4 C)  98.6 F (37 C)  TempSrc:  Oral  Oral  SpO2: 97% 98% 100% 100%  Weight:      Height:        Intake/Output Summary (Last 24 hours) at 10/09/2017 1032 Last data filed at 10/09/2017 1000 Gross per 24 hour  Intake 1259.99 ml  Output 350 ml  Net 909.99 ml   Filed Weights   10/07/17 0500 10/08/17 0500 10/09/17 0421  Weight: 64 kg (141 lb) 64.9 kg (143 lb) 66.2 kg (146 lb)    Examination:   General exam: Chronically ill looking, generalized weakness HEENT:PERRL,Oral mucosa moist, Ear/Nose normal on gross exam Respiratory system: Bilateral equal air entry, normal vesicular breath sounds, no wheezes or crackles  Cardiovascular system: A. fib with RVR . No JVD, murmurs, rubs, gallops or clicks. Gastrointestinal system: Abdomen is nondistended, soft and nontender. No organomegaly or masses felt. Normal bowel sounds heard. Central nervous system: Alert , oriented only to self. No focal neurological deficits. Extremities: No edema, no clubbing ,no cyanosis, distal peripheral pulses palpable. Skin: No rashes, lesions or ulcers,no icterus ,no pallor   Data Reviewed: I have personally reviewed following labs and imaging studies  CBC: Recent Labs  Lab 10/05/17 0517 10/05/17 1841 10/06/17 0316 10/07/17 0411 10/08/17 0451 10/09/17 0423  WBC 3.1*  --  3.6* 3.7* 2.2* 2.5*  NEUTROABS 1.4*  --  1.9 2.2 1.5* 1.4*  HGB 7.8* 8.5* 9.2* 9.1* 8.4* 8.2*  HCT 23.3* 25.3* 27.4* 27.3* 25.2* 24.5*  MCV 91.4  --  89.0 90.7 90.3 90.7  PLT <5*  --  18* 8* 10* 9*   Basic Metabolic Panel: Recent Labs  Lab 10/05/17 0517 10/05/17 1841 10/06/17 0316 10/07/17 0411 10/08/17 0451 10/09/17 0423  NA 139 141 142 139 139 138  K 4.5 3.4* 3.0* 3.8 3.2* 4.4  CL 103 103 103 101 102 101  CO2 29 29 30 27 28 28   GLUCOSE 174* 272* 146* 209* 200* 261*  BUN 22 25* 21 25* 31* 46*  CREATININE 0.63 0.78 0.63 0.86 0.77 0.98  CALCIUM 6.6* 6.8* 7.1* 6.9* 6.6* 6.9*    MG  --   --  1.2*  --   --   --   PHOS 2.4* 2.7 2.3* 2.9 2.8  --    GFR: Estimated Creatinine Clearance: 42.1 mL/min (by C-G formula based on SCr of 0.98 mg/dL). Liver Function Tests: Recent Labs  Lab 10/03/17 0555 10/04/17 0427 10/05/17 0517 10/05/17 1841 10/06/17 0316  AST 28 23 22 27 24   ALT 29 26 23 24 25   ALKPHOS 69 59 59 62 67  BILITOT 2.3* 2.3* 2.9* 2.6* 3.5*  PROT 5.3* 5.2* 5.0* 5.3* 5.6*  ALBUMIN 1.5* 1.5* 1.4* 1.5* 1.5*   No results for input(s): LIPASE, AMYLASE in the last 168 hours. No results for input(s): AMMONIA in the last 168 hours. Coagulation Profile: No results for input(s): INR, PROTIME in the last 168 hours. Cardiac Enzymes: No results for input(s): CKTOTAL, CKMB, CKMBINDEX, TROPONINI in the last 168 hours. BNP (last 3 results) No results for input(s): PROBNP in the last 8760 hours. HbA1C: No results  for input(s): HGBA1C in the last 72 hours. CBG: Recent Labs  Lab 10/08/17 0539 10/08/17 1153 10/08/17 1836 10/08/17 2329 10/09/17 0600  GLUCAP 141* 275* 293* 275* 251*   Lipid Profile: No results for input(s): CHOL, HDL, LDLCALC, TRIG, CHOLHDL, LDLDIRECT in the last 72 hours. Thyroid Function Tests: No results for input(s): TSH, T4TOTAL, FREET4, T3FREE, THYROIDAB in the last 72 hours. Anemia Panel: No results for input(s): VITAMINB12, FOLATE, FERRITIN, TIBC, IRON, RETICCTPCT in the last 72 hours. Sepsis Labs: No results for input(s): PROCALCITON, LATICACIDVEN in the last 168 hours.  Recent Results (from the past 240 hour(s))  Culture, blood (routine x 2)     Status: None (Preliminary result)   Collection Time: 10/04/17 12:59 PM  Result Value Ref Range Status   Specimen Description   Final    BLOOD LEFT HAND Performed at [facility] [address], [place], [place] [address]    Special Requests   Final    BOTTLES DRAWN AEROBIC ONLY Blood Culture results may not be optimal due to an inadequate volume of blood received in  culture bottles Performed at [facility] [address] [place], [place] [address]    Culture   Final    NO GROWTH 4 DAYS Performed at [facility], [place] [address] [place], [place] [address]    Report Status PENDING  Incomplete  Culture, blood (routine x 2)     Status: None (Preliminary result)   Collection Time: 10/04/17  1:04 PM  Result Value Ref Range Status   Specimen Description   Final    BLOOD LEFT FOREARM Performed at [facility] [address] [place], [place] [address]    Special Requests   Final    BOTTLES DRAWN AEROBIC ONLY Blood Culture results may not be optimal due to an inadequate volume of blood received in culture bottles Performed at [facility] [address] [place], [place] [address]    Culture   Final    NO GROWTH 4 DAYS Performed at [facility], [place] [address] [place], [place] [address]    Report Status PENDING  Incomplete         Radiology Studies: No results found.      Scheduled Meds: . Chlorhexidine Gluconate Cloth  6 each Topical Daily  . collagenase   Topical Daily  . digoxin  0.25 mg Intravenous Once  . feeding supplement (ENSURE ENLIVE)  237 mL Oral TID BM  . insulin aspart  0-9 Units Subcutaneous Q6H  . ketorolac  1 drop Both Eyes QID  . lidocaine  1 patch Transdermal QAC breakfast  . magnesium oxide  400 mg Oral Daily  . mouth rinse  15 mL Mouth Rinse BID  . mirtazapine  15 mg Oral QHS  . multivitamin with minerals  1 tablet Oral Daily  . nystatin  5 mL Oral Q8H  . polyethylene glycol  17 g Oral BID  . potassium chloride  40 mEq Oral Daily  . prednisoLONE acetate  1 drop Both Eyes QID  . sodium chloride flush  10-40 mL Intracatheter Q12H   Continuous Infusions: . diltiazem (CARDIZEM) infusion 5 mg/hr (10/09/17 0600)     LOS: 33 days    Time spent: 25 mins.     Shelly Coss, MD [facility] Pager [phone]  If 7PM-7AM,  please contact night-coverage [email or web address] Password [facility] 10/09/2017, 10:32 AM

## 2017-10-09 NOTE — Progress Notes (Addendum)
CSW continuing to follow for discharge needs. Noting per chart review that plan is for Dr. Marin Olp to follow up on Monday to continue goals of care discussions with patient and family.   CSW to continue to follow.  Laveda Abbe, LCSW Clinical Social Worker [phone] (Weekend Coverage)

## 2017-10-10 ENCOUNTER — Encounter (HOSPITAL_COMMUNITY): Payer: Self-pay | Admitting: Radiology

## 2017-10-10 LAB — BASIC METABOLIC PANEL
Anion gap: 7 (ref 5–15)
BUN: 46 mg/dL — ABNORMAL HIGH (ref 8–23)
CALCIUM: 6.5 mg/dL — AB (ref 8.9–10.3)
CHLORIDE: 105 mmol/L (ref 98–111)
CO2: 25 mmol/L (ref 22–32)
CREATININE: 0.91 mg/dL (ref 0.44–1.00)
GFR, EST NON AFRICAN AMERICAN: 58 mL/min — AB (ref >60)
Glucose, Bld: 268 mg/dL — ABNORMAL HIGH (ref 70–99)
Potassium: 4.8 mmol/L (ref 3.5–5.1)
SODIUM: 137 mmol/L (ref 135–145)

## 2017-10-10 LAB — CBC WITH DIFFERENTIAL/PLATELET
BASOS ABS: 0 10*3/uL (ref 0.0–0.1)
Basophils Relative: 0 %
EOS ABS: 0 10*3/uL (ref 0.0–0.7)
Eosinophils Relative: 0 %
HCT: 23.3 % — ABNORMAL LOW (ref 36.0–46.0)
Hemoglobin: 7.7 g/dL — ABNORMAL LOW (ref 12.0–15.0)
Lymphocytes Relative: 7 %
Lymphs Abs: 0.2 10*3/uL — ABNORMAL LOW (ref 0.7–4.0)
MCH: 30.2 pg (ref 26.0–34.0)
MCHC: 33 g/dL (ref 30.0–36.0)
MCV: 91.4 fL (ref 78.0–100.0)
MONO ABS: 0.7 10*3/uL (ref 0.1–1.0)
Monocytes Relative: 26 %
NEUTROS ABS: 1.8 10*3/uL (ref 1.7–7.7)
NEUTROS PCT: 67 %
PLATELETS: 10 10*3/uL — AB (ref 150–400)
RBC: 2.55 MIL/uL — AB (ref 3.87–5.11)
RDW: 15.2 % (ref 11.5–15.5)
WBC: 2.7 10*3/uL — AB (ref 4.0–10.5)
nRBC: 2 /100 WBC — ABNORMAL HIGH

## 2017-10-10 LAB — GLUCOSE, CAPILLARY
Glucose-Capillary: 207 mg/dL — ABNORMAL HIGH (ref 70–99)
Glucose-Capillary: 240 mg/dL — ABNORMAL HIGH (ref 70–99)
Glucose-Capillary: 251 mg/dL — ABNORMAL HIGH (ref 70–99)
Glucose-Capillary: 265 mg/dL — ABNORMAL HIGH (ref 70–99)

## 2017-10-10 LAB — BPAM PLATELET PHERESIS
Blood Product Expiration Date: 201907082359
ISSUE DATE / TIME: 201907070958
Unit Type and Rh: 6200

## 2017-10-10 LAB — PREPARE PLATELET PHERESIS: UNIT DIVISION: 0

## 2017-10-10 LAB — PREPARE RBC (CROSSMATCH)

## 2017-10-10 MED ORDER — MEGESTROL ACETATE 400 MG/10ML PO SUSP
400.0000 mg | Freq: Two times a day (BID) | ORAL | Status: DC
  Administered 2017-10-10 – 2017-10-11 (×3): 400 mg via ORAL
  Filled 2017-10-10 (×4): qty 10

## 2017-10-10 MED ORDER — DRONABINOL 2.5 MG PO CAPS
2.5000 mg | ORAL_CAPSULE | Freq: Two times a day (BID) | ORAL | Status: DC
  Administered 2017-10-10 – 2017-10-11 (×3): 2.5 mg via ORAL
  Filled 2017-10-10 (×3): qty 1

## 2017-10-10 MED ORDER — SODIUM CHLORIDE 0.9% IV SOLUTION
Freq: Once | INTRAVENOUS | Status: AC
  Administered 2017-10-10: 11:00:00 via INTRAVENOUS

## 2017-10-10 NOTE — Progress Notes (Signed)
I very much appreciate everybody's help in my absence.  As always, I know that she has had incredible care.  I talked to 1 of her daughters this morning.  I know the family is having a very hard time trying to make a decision as to what to do.  I really think this is a situation where a bone marrow can help [place] out.  If Leah Lewis still has a significant amount of leukemia in the bone marrow, then I clearly would recommend hospice care.  She would not be a candidate for any additional therapy.  However, if we find that she has had a very good response and has had a nice decrease in her leukemia burden, then we will have to figure out what else we can do.  Her main problem continues to be lack of nutrition.  She just is not going to eat.  I would not recommend putting a feeding tube.  She has had no fever as far as I can tell.  Her last x-ray was a week ago.  This seems to show improvement in her pulmonary infiltrates..  She still has the atrial fibrillation.  She is had hypotension.  I am not sure why the medical asked was stopped.  I do not think that this had anything to do with the low platelets.  There are no labs back yet today.  She does have significant hyperglycemia.  Her renal function is doing okay.  There is been very little progress with respect to any kind of physical therapy.  Again, I realize the family is having a hard time making decisions as to what needs to be done.  Again this is where a bone marrow biopsy can really help [place] out.  I know that she is getting outstanding care by all the staff down in the ICU.  Everybody is really doing a great job in trying to take care of her.  As always, we had a very good prayer session.  [name], MD  Acts 3:16

## 2017-10-10 NOTE — Progress Notes (Signed)
CSW received call from pt's daughter Joycelyn Schmid today. She states she is awaiting to see if pt's status improved enough to proceed with DC planning- CSWs have been following to assist with SNF placement. Daughter reports she still hopes that pt will be able to DC home "but we just don't know yet if she's going to get better." CSW assured her will continue following to assist.   Sharren Bridge, MSW, LCSW Clinical Social Work 10/10/2017 [phone] coverage for [phone]

## 2017-10-10 NOTE — Progress Notes (Signed)
Chief Complaint: Patient was seen in consultation today for bone marrow biopsy at the request of Dr. Marin Olp  Referring Physician(s): Dr. Marin Olp  Supervising Physician: Daryll Brod  Patient Status: [facility] - In-pt  History of Present Illness: Leah Lewis is a 83 y.o. female with hx of AML. She has pancytopenia with profound thrombocytopenia. IR is asked to perform bone marrow bx to reassess. Chart, PMHx, meds, labs reviewed. D/w daughter Leah Lewis via phone.  Past Medical History:  Diagnosis Date  . AML (acute myeloid leukemia) ([place]) 09/12/2017  . Atrial fibrillation ([place]) 09/15/2017  . Goals of care, counseling/discussion 09/12/2017  . High cholesterol   . Hypertension   . Stroke ([place])   . Thrush 09/22/2017    Past Surgical History:  Procedure Laterality Date  . ABDOMINAL HYSTERECTOMY    . CATARACT EXTRACTION     bilateral  . CESAREAN SECTION      Allergies: Percocet [oxycodone-acetaminophen]  Medications:  Current Facility-Administered Medications:  .  0.9 %  sodium chloride infusion, , Intravenous, Continuous, Adhikari, Amrit, MD, Last Rate: 75 mL/hr at 10/10/17 1135 .  acetaminophen (TYLENOL) tablet 650 mg, 650 mg, Oral, Q6H PRN, 650 mg at 10/06/17 0440 **OR** acetaminophen (TYLENOL) suppository 650 mg, 650 mg, Rectal, Q6H PRN, Mariel Aloe, MD, 650 mg at 09/21/17 1856 .  alteplase (CATHFLO ACTIVASE) injection 2 mg, 2 mg, Intracatheter, Once PRN, Mariel Aloe, MD .  Chlorhexidine Gluconate Cloth 2 % PADS 6 each, 6 each, Topical, Daily, Mariel Aloe, MD, 6 each at 10/10/17 1514 .  collagenase (SANTYL) ointment, , Topical, Daily, Mariel Aloe, MD, 1 application at 50/09/38 1042 .  dronabinol (MARINOL) capsule 2.5 mg, 2.5 mg, Oral, BID AC, Volanda Napoleon, MD, 2.5 mg at 10/10/17 1440 .  feeding supplement (ENSURE ENLIVE) (ENSURE ENLIVE) liquid 237 mL, 237 mL, Oral, TID BM, Mariel Aloe, MD, 237 mL at 10/08/17 0936 .  heparin lock flush 100  unit/mL, 500 Units, Intracatheter, Once PRN, Mariel Aloe, MD .  heparin lock flush 100 unit/mL, 250 Units, Intracatheter, Once PRN, Mariel Aloe, MD .  heparin lock flush 100 unit/mL, 500 Units, Intracatheter, Daily PRN, Mariel Aloe, MD .  heparin lock flush 100 unit/mL, 250 Units, Intracatheter, PRN, Mariel Aloe, MD .  HYDROcodone-acetaminophen (NORCO/VICODIN) 5-325 MG per tablet 1-2 tablet, 1-2 tablet, Oral, Q4H PRN, Mariel Aloe, MD .  insulin aspart (novoLOG) injection 0-9 Units, 0-9 Units, Subcutaneous, Q6H, Mariel Aloe, MD, 3 Units at 10/10/17 1233 .  ketorolac (ACULAR) 0.5 % ophthalmic solution 1 drop, 1 drop, Both Eyes, QID, Mariel Aloe, MD, 1 drop at 10/10/17 1444 .  lidocaine (LIDODERM) 5 % 1 patch, 1 patch, Transdermal, QAC breakfast, Mariel Aloe, MD, 1 patch at 10/10/17 0827 .  magnesium oxide (MAG-OX) tablet 400 mg, 400 mg, Oral, Daily, Adhikari, Amrit, MD, 400 mg at 10/10/17 1042 .  MEDLINE mouth rinse, 15 mL, Mouth Rinse, BID, Mariel Aloe, MD, 15 mL at 10/10/17 0911 .  megestrol (MEGACE) 400 MG/10ML suspension 400 mg, 400 mg, Oral, BID, Volanda Napoleon, MD, 400 mg at 10/10/17 1042 .  metoprolol tartrate (LOPRESSOR) injection 5 mg, 5 mg, Intravenous, Q8H PRN, Shelly Coss, MD, 5 mg at 10/06/17 2126 .  mirtazapine (REMERON) tablet 15 mg, 15 mg, Oral, QHS, Mariel Aloe, MD, 15 mg at 10/08/17 2017 .  multivitamin with minerals tablet 1 tablet, 1 tablet, Oral, Daily, Mariel Aloe, MD, 1 tablet at  10/10/17 0910 .  nystatin (MYCOSTATIN) 100000 UNIT/ML suspension 500,000 Units, 5 mL, Oral, Q8H, Mariel Aloe, MD, 500,000 Units at 10/10/17 1441 .  ondansetron (ZOFRAN) injection 4 mg, 4 mg, Intravenous, Q6H PRN, Mariel Aloe, MD, 4 mg at 09/11/17 1229 .  polyethylene glycol (MIRALAX / GLYCOLAX) packet 17 g, 17 g, Oral, BID, Mariel Aloe, MD, 17 g at 10/07/17 0931 .  potassium chloride SA (K-DUR,KLOR-CON) CR tablet 40 mEq, 40 mEq, Oral, Daily,  Adhikari, Amrit, MD, 40 mEq at 10/10/17 0910 .  prednisoLONE acetate (PRED FORTE) 1 % ophthalmic suspension 1 drop, 1 drop, Both Eyes, QID, Mariel Aloe, MD, 1 drop at 10/10/17 1440 .  sodium chloride flush (NS) 0.9 % injection 10 mL, 10 mL, Intracatheter, PRN, Mariel Aloe, MD .  sodium chloride flush (NS) 0.9 % injection 10 mL, 10 mL, Intracatheter, PRN, Mariel Aloe, MD .  sodium chloride flush (NS) 0.9 % injection 10-40 mL, 10-40 mL, Intracatheter, Q12H, Mariel Aloe, MD, 20 mL at 10/10/17 1043 .  sodium chloride flush (NS) 0.9 % injection 3 mL, 3 mL, Intracatheter, PRN, Mariel Aloe, MD    Family History  Problem Relation Age of Onset  . Heart attack Mother   . Pneumonia Father   . Parkinsonism Father   . Breast cancer Sister     Social History   Socioeconomic History  . Marital status: Single    Spouse name: Not on file  . Number of children: Not on file  . Years of education: Not on file  . Highest education level: Not on file  Occupational History  . Not on file  Social Needs  . Financial resource strain: Not on file  . Food insecurity:    Worry: Not on file    Inability: Not on file  . Transportation needs:    Medical: Not on file    Non-medical: Not on file  Tobacco Use  . Smoking status: Never Smoker  . Smokeless tobacco: Former Systems developer    Types: Chew  Substance and Sexual Activity  . Alcohol use: No  . Drug use: No  . Sexual activity: Not on file  Lifestyle  . Physical activity:    Days per week: Not on file    Minutes per session: Not on file  . Stress: Not on file  Relationships  . Social connections:    Talks on phone: Not on file    Gets together: Not on file    Attends religious service: Not on file    Active member of club or organization: Not on file    Attends meetings of clubs or organizations: Not on file    Relationship status: Not on file  Other Topics Concern  . Not on file  Social History Narrative  . Not on file      Review of Systems: A 12 point ROS discussed and pertinent positives are indicated in the HPI above.  All other systems are negative.  Review of Systems  Vital Signs: BP (!) 105/57   Pulse 99   Temp (!) 97 F (36.1 C) (Axillary)   Resp (!) 31   Ht '5\' 4"'$  (1.626 m)   Wt 140 lb (63.5 kg)   SpO2 94%   BMI 24.03 kg/m   Physical Exam  Constitutional: She is oriented to person, place, and time. She appears well-developed. No distress.  HENT:  Head: Normocephalic.  Mouth/Throat: Oropharynx is clear and moist.  Cardiovascular: Normal rate,  regular rhythm and normal heart sounds.  Pulmonary/Chest: Effort normal and breath sounds normal. No respiratory distress.  Neurological: She is alert and oriented to person, place, and time.  Skin: Skin is warm and dry.  Psychiatric: She has a normal mood and affect.    Imaging: Dg Chest 1 View  Result Date: 09/25/2017 CLINICAL DATA:  Fever. EXAM: CHEST  1 VIEW COMPARISON:  09/21/2017. FINDINGS: Stable enlarged cardiac silhouette. Stable patchy opacity in the left mid lung zone with mildly increased patchy opacity in the left lower lung zone. Interval small amount of patchy opacity beneath the minor fissure on the right. Stable mildly prominent interstitial markings. Thoracic spine degenerative changes. Right PICC tip in the inferior aspect of the superior vena cava approximately 1.5 cm above the superior cavoatrial junction. IMPRESSION: 1. Mildly increased left lung pneumonia. 2. Interval small amount of probable pneumonia in the right mid to lower lung zone, possibly in the right middle lobe. 3. Stable cardiomegaly and mild chronic interstitial lung disease. Electronically Signed   By: Claudie Revering M.D.   On: 09/25/2017 21:02   Ct Chest Wo Contrast  Result Date: 09/19/2017 CLINICAL DATA:  Generalized weakness and fatigue. Fever, anemia. AML. EXAM: CT CHEST WITHOUT CONTRAST TECHNIQUE: Multidetector CT imaging of the chest was performed following  the standard protocol without IV contrast. COMPARISON:  None. FINDINGS: Cardiovascular: Right PICC tip terminates junction at the SVC. Atherosclerotic calcification of the arterial vasculature, including coronary arteries. Pulmonary arteries and heart are enlarged. No pericardial effusion. Mediastinum/Nodes: Thyroid is heterogeneous. Mediastinal lymph nodes measure up to 10 mm in the low right paratracheal station. Hilar regions are difficult to evaluate without IV contrast. Subpectoral and axillary lymph nodes are not enlarged by CT size criteria. Esophagus is grossly unremarkable. Lungs/Pleura: Image quality is rather degraded by respiratory motion. There is perihilar peribronchovascular ground-glass and consolidation with peripheral sparing. Septal thickening is not an overwhelming feature. No pleural fluid. Airway is unremarkable. Upper Abdomen: Visualized portions of the liver, adrenal glands, kidneys, spleen, pancreas, stomach and bowel are grossly unremarkable. Musculoskeletal: Degenerative changes in the spine. No worrisome lytic or sclerotic lesions. IMPRESSION: 1. Perihilar peribronchovascular ground-glass and consolidation with peripheral sparing. Findings may be due to leukostasis related to AML, pulmonary hemorrhage or pulmonary edema. 2. Borderline enlarged mediastinal lymph nodes. 3. Aortic atherosclerosis (ICD10-170.0). Coronary artery calcification. 4. Enlarged pulmonary arteries, indicative of pulmonary arterial hypertension. Electronically Signed   By: Lorin Picket M.D.   On: 09/19/2017 16:56   Mr Sacrum Si Joints Wo Contrast  Result Date: 09/13/2017 CLINICAL DATA:  Osteomyelitis of the sacrum suspected. Possible history of lymphoma or leukemia. Pancytopenia. EXAM: MRI PELVIS WITHOUT CONTRAST TECHNIQUE: Multiplanar multisequence MR imaging of the pelvis was performed. No intravenous contrast was administered. COMPARISON:  04/26/2007 pelvis MRI FINDINGS: Urinary Tract: Urinary bladder is  unremarkable. No evidence of distal hydroureter. Bowel:  Unremarkable visualized pelvic bowel loops. Vascular/Lymphatic: No pathologically enlarged lymph nodes. No significant vascular abnormality seen. Reproductive:  Hysterectomy.  No adnexal mass. Other:  No free fluid. Musculoskeletal: Patchy heterogeneous marrow pattern of the bony pelvis and sacrum. Slight marrow edema of the right iliac bone posteriorly may reflect stigmata of recent marrow biopsy. Redemonstration of degenerative joint space narrowing of both hips minimal spurring at the femoral head-neck junction bilaterally. There is lower lumbar degenerative disc and facet arthropathy of the included L4-5 and L5-S1 levels. Sacroiliac joints are maintained bilaterally. Pubic symphysis is intact. No marrow signal abnormality suspicious for acute fracture, frank bone destruction  or osteomyelitis. Edema of the left obturator internus and pectineus muscles compatible with muscle strain. IMPRESSION: 1. New heterogeneous ill-defined marrow pattern of the included pelvis and sacrum that may reflect and infiltrative marrow abnormality such as leukemia or lymphoma. 2. Mild marrow edema involving the right iliac bone would be in keeping with the patient's recent bone marrow biopsy site. 3. No conclusive evidence for osteomyelitis. 4. Left pectineus and obturator internus muscle strains. Electronically Signed   By: Ashley Royalty M.D.   On: 09/13/2017 19:02   Dg Chest Port 1 View  Result Date: 10/03/2017 CLINICAL DATA:  Acute respiratory failure EXAM: PORTABLE CHEST 1 VIEW COMPARISON:  09/28/2017 FINDINGS: The cardiac shadow is again enlarged. Right-sided PICC line is again seen. Patchy infiltrate is noted in the left upper lung but improved from the prior exam. The right lung is now clear. No bony abnormality is noted. IMPRESSION: Improving infiltrates with mild residual on the left. Electronically Signed   By: Inez Catalina M.D.   On: 10/03/2017 14:00   Dg Chest  Port 1 View  Result Date: 09/28/2017 CLINICAL DATA:  Follow-up pneumonia EXAM: PORTABLE CHEST 1 VIEW COMPARISON:  09/25/2017 FINDINGS: Cardiac shadow remains enlarged. Lungs are well aerated bilaterally. Persistent left-sided infiltrate and mild right basilar infiltrate is noted. No significant interval change is noted. No effusion is seen. Degenerative changes of the thoracic spine are noted. Right-sided PICC line is again noted and stable. IMPRESSION: Stable bilateral infiltrates left greater than right. Electronically Signed   By: Inez Catalina M.D.   On: 09/28/2017 09:22   Dg Chest Port 1 View  Result Date: 09/21/2017 CLINICAL DATA:  Shortness of breath and fever. Acute myeloid leukemia, atrial fibrillation. EXAM: PORTABLE CHEST 1 VIEW COMPARISON:  Chest x-ray of September 17, 2017 and chest CT scan of September 19, 2017. FINDINGS: The lungs are adequately inflated. The new confluent airspace opacities are present greatest on the left. There is no pleural effusion or pneumothorax. The heart is enlarged. The pulmonary vascularity is indistinct but definite cephalization is not observed. There is calcification in the wall of the thoracic aorta. The right-sided PICC line tip projects over the midportion of the SVC. IMPRESSION: Interval development of airspace opacities greatest on the left most compatible with pneumonia. Underlying low-grade CHF with stable cardiomegaly. Thoracic aortic atherosclerosis. Electronically Signed   By: David  Lewis M.D.   On: 09/21/2017 08:20   Dg Chest Port 1 View  Result Date: 09/17/2017 CLINICAL DATA:  Fever EXAM: PORTABLE CHEST 1 VIEW COMPARISON:  09/12/2017 FINDINGS: There is cardiomegaly. Vascular congestion. Bilateral patchy airspace opacities, left greater than right. This could reflect asymmetric edema or infection. No effusions. No acute bony abnormality. Right PICC line is in place with the tip at the cavoatrial junction. IMPRESSION: Patchy bilateral airspace disease, left  greater than right could reflect asymmetric edema or infection. Electronically Signed   By: Rolm Baptise M.D.   On: 09/17/2017 09:38   Dg Chest Port 1 View  Result Date: 09/12/2017 CLINICAL DATA:  83 year old female with a history of fever EXAM: PORTABLE CHEST 1 VIEW COMPARISON:  09/30/2017 FINDINGS: Cardiomediastinal silhouette unchanged in size and contour. No evidence of central vascular congestion. No pneumothorax or pleural effusion. No confluent airspace disease. No acute displaced fracture. IMPRESSION: Negative for acute cardiopulmonary disease Electronically Signed   By: Corrie Mckusick D.O.   On: 09/12/2017 08:30   Dg Shoulder Right Port  Result Date: 09/20/2017 CLINICAL DATA:  Right shoulder pain with difficulty raising  the arm. No known injury. EXAM: PORTABLE RIGHT SHOULDER COMPARISON:  Limited views of the right shoulder from a scout radiograph of the CT scan of the chest of September 19, 2017 FINDINGS: The bones are subjectively adequately mineralized. The glenohumeral joint space is grossly normal. There is moderate narrowing of the AC joint. The subacromial subdeltoid space is normal. There is a small amount of calcification in the region of the insertion of the rotator cuff on the greater tuberosity of the humerus. The observed portions of the right lung exhibit increased interstitial and patchy airspace opacities. IMPRESSION: Degenerative change of the right shoulder centered on the [facility] joint. No acute bony abnormality. Electronically Signed   By: David  Lewis M.D.   On: 09/20/2017 09:04   Ir Picc Placement Right >5 Yrs Inc Img Guide  Result Date: 09/14/2017 INDICATION: 83 year old female referred for PICC for AML. EXAM: PICC LINE PLACEMENT WITH ULTRASOUND AND FLUOROSCOPIC GUIDANCE MEDICATIONS: None ANESTHESIA/SEDATION: None FLUOROSCOPY TIME:  Fluoroscopy Time: 0 minutes 12 seconds (0.9 mGy). COMPLICATIONS: None PROCEDURE: Informed written consent was obtained from the patient after a thorough  discussion of the procedural risks, benefits and alternatives. All questions were addressed. Maximal Sterile Barrier Technique was utilized including caps, mask, sterile gowns, sterile gloves, sterile drape, hand hygiene and skin antiseptic. A timeout was performed prior to the initiation of the procedure. Patient was position in the supine position on the fluoroscopy table with the right arm abducted 90 degrees. Ultrasound survey of the upper extremity was performed with images stored and sent to PACs. The right basilic vein was selected for access. Once the patient was prepped and draped in the usual sterile fashion, the skin and subcutaneous tissues were generously infiltrated with 1% lidocaine for local anesthesia. A micropuncture access kit was then used to access the targeted vein. Wire was passed centrally, confirmed to be within the venous system under fluoroscopy. A small stab incision was made with an 11 blade scalpel and the sheath was then placed over the wire. Estimated length of the catheter was then performed with the indwelling wire. Catheter was amputated at 37 cm length and placed with coaxial wire through the peel-away. Double-lumen, power injectable PICC in the basilic vein. Tip confirmed at the cavoatrial junction, and the catheter is ready for use. Stat lock was placed. Patient tolerated the procedure well and remained hemodynamically stable throughout. No complications were encountered and no significant blood loss was encountered. IMPRESSION: Status post right upper extremity PICC.  Catheter ready for use. Signed, Dulcy Fanny. Dellia Nims, RPVI [facility] Electronically Signed   By: Corrie Mckusick D.O.   On: 09/14/2017 07:35    Labs:  CBC: Recent Labs    10/07/17 0411 10/08/17 0451 10/09/17 0423 10/10/17 0941  WBC 3.7* 2.2* 2.5* 2.7*  HGB 9.1* 8.4* 8.2* 7.7*  HCT 27.3* 25.2* 24.5* 23.3*  PLT 8* 10* 9* 10*     COAGS: Recent Labs    09/04/2017 1919 09/13/17 0309  INR 1.05 1.30    BMP: Recent Labs    10/07/17 0411 10/08/17 0451 10/09/17 0423 10/10/17 0941  NA 139 139 138 137  K 3.8 3.2* 4.4 4.8  CL 101 102 101 105  CO2 '27 28 28 25  '$ GLUCOSE 209* 200* 261* 268*  BUN 25* 31* 46* 46*  CALCIUM 6.9* 6.6* 6.9* 6.5*  CREATININE 0.86 0.77 0.98 0.91  GFRNONAA >60 >60 53* 58*  GFRAA >60 >60 >60 >60    LIVER FUNCTION  TESTS: Recent Labs    10/04/17 0427 10/05/17 0517 10/05/17 1841 10/06/17 0316  BILITOT 2.3* 2.9* 2.6* 3.5*  AST '23 22 27 24  '$ ALT '26 23 24 25  '$ ALKPHOS 59 59 62 67  PROT 5.2* 5.0* 5.3* 5.6*  ALBUMIN 1.5* 1.4* 1.5* 1.5*    TUMOR MARKERS: No results for input(s): AFPTM, CEA, CA199, CHROMGRNA in the last 8760 hours.  Assessment and Plan: AML Pancytopenia. Request for repeat CT guided BM bx, last done 6/5. Risks and benefits discussed with the patient and daughter including, but not limited to bleeding, infection, damage to adjacent structures or low yield requiring additional tests.  All of the patient's daughter's questions were answered, agreeable to proceed. Consent signed and in chart.    Thank you for this interesting consult.  I greatly enjoyed meeting Leah Lewis and look forward to participating in their care.  A copy of this report was sent to the requesting provider on this date.  Electronically Signed: Ascencion Dike, PA-C 10/10/2017, 3:38 PM   I spent a total of 20 minutes in face to face in clinical consultation, greater than 50% of which was counseling/coordinating care for bone marrow biopsy

## 2017-10-10 NOTE — Progress Notes (Signed)
PROGRESS NOTE    Leah Lewis  NLG:[id number] DOB: May 25, 1934 DOA: 09/07/2017 PCP: Wenda Low, MD   Brief Narrative:  Patient is a 83 year old female with past medical history of osteoporosis, hyperlipidemia, DVT who presented with generalized weakness and found to have pancytopenia secondary to AML.  She has received multiple PRBC and platelet transfusion during this admission.  Her hospital course was remarkable for development of A. fib with RVR.  Oncology is following and she had been started on chemotherapy.  Patient has ongoing issues with pancytopenia: anemia and thrombocytopenia.  Though patient has very poor prognosis, family wants to continue treatment and chemotherapy.  Palliative care was also following.  [facility]  is planning for bone marrow biopsy.  Assessment & Plan:   Active Problems:   Pressure injury of skin   AML (acute myeloid leukemia) (HCC)   Goals of care, counseling/discussion   AML (acute myeloblastic leukemia) ([place])   Advance care planning   Palliative care by specialist   Fever   Pancytopenia ([place])   Atrial fibrillation with RVR ([place])   Thrush   Thrombocytopenia ([place])   Advanced care planning/counseling discussion  AML: Started on chemotherapy on 6/24.  Grave prognosis.  Received first cycle of Dacogen from 6/12 to 6/17.  Plan for his second cycle of Dacogen on 7/11 if she remains stable.  Was on  venetoclax which has been discontinued due to thrombocytopenia. Oncology discussed with family again. She follows with Dr. Marin Olp as an outpatient.  Currently the plan is to do a bone marrow biopsy to determine the leukemia burden .If she has significant leukemia burden then oncology will recommend hospice after discussion with family. I have discussed with the daughter: [name] and [name] couple of days ago .  They are interested only on listening from Dr. Marin Olp and will depend upon his recommendation.  Anemia/thrombocytopenia/leucopenia:  Hb this  morning was  7.7 and platelets were 10 .We will transfuse her with 1 unit of PRBC.  Whenever indicated ,she  should be  transfused with irradiated, CMV negative blood.  Total of 9 units of PRBC and 14 units of platelets so far.  Hypotension: Patient noted to be hypotensive since yesterday.  Was given 1 L of IV normal saline bolus.  She is on maintenance fluid.  Will discontinue all antihypertensives.  A. fib with RVR:  HR better today.Not on anti-coagulation due to thrombocytopenia.  Cardiology was consulted but have signed off .  Cardiology did not want to start her on  amiodarone drip or digoxin.  Recommended continuing IV metoprolol if needed.  Right middle lobe pneumonia:  She had completed the course of antibiotics  Oral thrush: Continue nystatin.  Acute respiratory failure with hypoxia: In the setting of pneumonia.  Pneumonia was treated with antibiotics.  Last chest x-ray showed improved infiltrates.  Respiratory status has improved.  Currently she is saturating fine on room air.  Diabetes mellitus: Continue sliding scale insulin.  Hypokalemia/hypomagnesemia: Currently being supplemented.  Malnutrition: Nutrition team following.  Nutrition team was recommending feeding tube.  Unsafe condition to place feeding tube due to severe thrombocytopenia.  Essential hypertension: Currently BP on lower side.   DVT prophylaxis:SCD Code Status: Full Family Communication: Daughter on phone Disposition Plan: Unknown at this point   Consultants: Oncology, cardiology  Procedures: None  Antimicrobials: None  Subjective: Patient seen and examined the bedside this morning.  Remains very weak and lethargic this morning.  Hypotensive. Objective: Vitals:   10/10/17 0500 10/10/17 0800 10/10/17  9622 10/10/17 1000  BP: 90/65 (!) 73/42 (!) 93/58 (!) 88/46  Pulse:      Resp: (!) 27 (!) 24 (!) 21 (!) 24  Temp:  (!) 97.5 F (36.4 C)    TempSrc:  Axillary    SpO2: 94% 100% 100% 96%  Weight: 63.5  kg (140 lb)     Height:        Intake/Output Summary (Last 24 hours) at 10/10/2017 1059 Last data filed at 10/10/2017 1000 Gross per 24 hour  Intake 2381.92 ml  Output 550 ml  Net 1831.92 ml   Filed Weights   10/08/17 0500 10/09/17 0421 10/10/17 0500  Weight: 64.9 kg (143 lb) 66.2 kg (146 lb) 63.5 kg (140 lb)    Examination:   General exam: Chronically ill looking, lethargic HEENT:PERRL,Oral mucosa moist, Ear/Nose normal on gross exam Respiratory system: Bilateral equal air entry, normal vesicular breath sounds, no wheezes or crackles  Cardiovascular system: A. fib , No JVD, murmurs, rubs, gallops or clicks. Gastrointestinal system: Abdomen is nondistended, soft and nontender. No organomegaly or masses felt. Normal bowel sounds heard. Central nervous system: Alert , oriented only to self. No focal neurological deficits. Extremities: No edema, no clubbing ,no cyanosis, distal peripheral pulses palpable. Skin: No rashes, lesions or ulcers,no icterus ,no pallor   Data Reviewed: I have personally reviewed following labs and imaging studies  CBC: Recent Labs  Lab 10/06/17 0316 10/07/17 0411 10/08/17 0451 10/09/17 0423 10/10/17 0941  WBC 3.6* 3.7* 2.2* 2.5* 2.7*  NEUTROABS 1.9 2.2 1.5* 1.4* 1.8  HGB 9.2* 9.1* 8.4* 8.2* 7.7*  HCT 27.4* 27.3* 25.2* 24.5* 23.3*  MCV 89.0 90.7 90.3 90.7 91.4  PLT 18* 8* 10* 9* 10*   Basic Metabolic Panel: Recent Labs  Lab 10/05/17 0517 10/05/17 1841 10/06/17 0316 10/07/17 0411 10/08/17 0451 10/09/17 0423 10/10/17 0941  NA 139 141 142 139 139 138 137  K 4.5 3.4* 3.0* 3.8 3.2* 4.4 4.8  CL 103 103 103 101 102 101 105  CO2 _0 GLUCOSE 174* 272* 146* 209* 200* 261* 268*  BUN 22 25* 21 25* 31* 46* 46*  CREATININE 0.63 0.78 0.63 0.86 0.77 0.98 0.91  CALCIUM 6.6* 6.8* 7.1* 6.9* 6.6* 6.9* 6.5*  MG  --   --  1.2*  --   --   --   --   PHOS 2.4* 2.7 2.3* 2.9 2.8  --   --    GFR: Estimated Creatinine Clearance: 41.9 mL/min  (by C-G formula based on SCr of 0.91 mg/dL). Liver Function Tests: Recent Labs  Lab 10/04/17 0427 10/05/17 0517 10/05/17 1841 10/06/17 0316  AST _1 ALT _2 ALKPHOS 59 59 62 67  BILITOT 2.3* 2.9* 2.6* 3.5*  PROT 5.2* 5.0* 5.3* 5.6*  ALBUMIN 1.5* 1.4* 1.5* 1.5*   No results for input(s): LIPASE, AMYLASE in the last 168 hours. No results for input(s): AMMONIA in the last 168 hours. Coagulation Profile: No results for input(s): INR, PROTIME in the last 168 hours. Cardiac Enzymes: No results for input(s): CKTOTAL, CKMB, CKMBINDEX, TROPONINI in the last 168 hours. BNP (last 3 results) No results for input(s): PROBNP in the last 8760 hours. HbA1C: No results for input(s): HGBA1C in the last 72 hours. CBG: Recent Labs  Lab 10/09/17 0600 10/09/17 1200 10/09/17 1729 10/09/17 2341 10/10/17 0603  GLUCAP 251* 259* 213* 183* 207*   Lipid Profile: No results for input(s): CHOL, HDL, LDLCALC,  TRIG, CHOLHDL, LDLDIRECT in the last 72 hours. Thyroid Function Tests: No results for input(s): TSH, T4TOTAL, FREET4, T3FREE, THYROIDAB in the last 72 hours. Anemia Panel: No results for input(s): VITAMINB12, FOLATE, FERRITIN, TIBC, IRON, RETICCTPCT in the last 72 hours. Sepsis Labs: No results for input(s): PROCALCITON, LATICACIDVEN in the last 168 hours.  Recent Results (from the past 240 hour(s))  Culture, blood (routine x 2)     Status: None   Collection Time: 10/04/17 12:59 PM  Result Value Ref Range Status   Specimen Description   Final    BLOOD LEFT HAND Performed at [facility] [address], [place], [place] [address]    Special Requests   Final    BOTTLES DRAWN AEROBIC ONLY Blood Culture results may not be optimal due to an inadequate volume of blood received in culture bottles Performed at [facility] [address], [place], [place] [address]    Culture   Final    NO GROWTH 5 DAYS Performed at [facility], [place] [address] [place], [place] [address]    Report Status 10/09/2017 FINAL  Final  Culture, blood (routine x 2)     Status: None   Collection Time: 10/04/17  1:04 PM  Result Value Ref Range Status   Specimen Description   Final    BLOOD LEFT FOREARM Performed at [facility] [address], [place], [place] [address]    Special Requests   Final    BOTTLES DRAWN AEROBIC ONLY Blood Culture results may not be optimal due to an inadequate volume of blood received in culture bottles Performed at [facility] [address], [place], [place] [address]    Culture   Final    NO GROWTH 5 DAYS Performed at [facility], [place] [address] [place], [place] [address]    Report Status 10/09/2017 FINAL  Final         Radiology Studies: No results found.      Scheduled Meds: . sodium chloride   Intravenous Once  . Chlorhexidine Gluconate Cloth  6 each Topical Daily  . collagenase   Topical Daily  . dronabinol  2.5 mg Oral BID AC  . feeding supplement (ENSURE ENLIVE)  237 mL Oral TID BM  . insulin aspart  0-9 Units Subcutaneous Q6H  . ketorolac  1 drop Both Eyes QID  . lidocaine  1 patch Transdermal QAC breakfast  . magnesium oxide  400 mg Oral Daily  . mouth rinse  15 mL Mouth Rinse BID  . megestrol  400 mg Oral BID  . mirtazapine  15 mg Oral QHS  . multivitamin with minerals  1 tablet Oral Daily  . nystatin  5 mL Oral Q8H  . polyethylene glycol  17 g Oral BID  . potassium chloride  40 mEq Oral Daily  . prednisoLONE acetate  1 drop Both Eyes QID  . sodium chloride flush  10-40 mL Intracatheter Q12H   Continuous Infusions: . sodium chloride 50 mL/hr at 10/10/17 0900     LOS: 34 days    Time spent: 35 mins.     Shelly Coss, MD [facility] Pager [phone]  If 7PM-7AM, please contact night-coverage [email or web address] Password [email or web address] 10/10/2017, 10:59 AM

## 2017-10-10 NOTE — Progress Notes (Signed)
Palliative Medicine RN Note: Rec'd call from Christen Bame daughter requesting medical update. Family is relying on Dr Marin Olp for continuing decisions/discussions re [place]. I called his office and left a message requesting he call Joycelyn Schmid.  Marjie Skiff Ulysse Siemen, RN, BSN, [facility] Palliative Medicine Team 10/10/2017 10:50 AM Office [phone]

## 2017-10-10 NOTE — Progress Notes (Addendum)
  Speech Language Pathology Treatment: Dysphagia  Patient Details Name: Leah Lewis MRN: [id number] DOB: 05-11-1934 Today's Date: 10/10/2017 Time: 4627-0350 SLP Time Calculation (min) (ACUTE ONLY): 32 min  Assessment / Plan / Recommendation Clinical Impression  Pt awake in bed and willing to try sandwich as she reports she may eat more if diet was liberalized some.  SLP obtained [place] sandwich - she took 2 bites only.  Prolonged mastication with mild oral residuals but she sensed these with delay and cleared with SLP helping to hold cup for liquid intake.  Pt also required to remove bread pieces that adhered to her dentures.    Will advance diet to maximize intake and advised RN that pt desires mac n cheese for dinner.  Will follow up x1 more to assure tolerance.  Educated pt to plan for advancement and reviewed aspiration precautions with mod I.     HPI HPI: 83 year old female admitted 09/28/2017 with generalized weakness and fatigue due to pancytopenia/acute myeloblastic leukemia. PMH: HTN, CVA, L DVT, hx 30# weight loss over the past year.      SLP Plan  Continue with current plan of care       Recommendations  Diet recommendations: Dysphagia 3 (mechanical soft);Thin liquid Liquids provided via: Cup;Straw Medication Administration: Whole meds with puree Supervision: Staff to assist with self feeding Compensations: Slow rate;Small sips/bites;Lingual sweep for clearance of pocketing Postural Changes and/or Swallow Maneuvers: Seated upright 90 degrees;Upright 30-60 min after meal                Oral Care Recommendations: Oral care QID Follow up Recommendations: None SLP Visit Diagnosis: Dysphagia, unspecified (R13.10) Plan: Continue with current plan of care       GO              Luanna Salk, [facility] SLP [place], [place] 10/10/2017, 1:44 PM

## 2017-10-11 ENCOUNTER — Inpatient Hospital Stay (HOSPITAL_COMMUNITY): Payer: Medicare Other

## 2017-10-11 ENCOUNTER — Other Ambulatory Visit: Payer: Self-pay | Admitting: Family

## 2017-10-11 ENCOUNTER — Encounter (HOSPITAL_COMMUNITY): Payer: Self-pay | Admitting: Radiology

## 2017-10-11 DIAGNOSIS — R6521 Severe sepsis with septic shock: Secondary | ICD-10-CM

## 2017-10-11 DIAGNOSIS — A419 Sepsis, unspecified organism: Secondary | ICD-10-CM

## 2017-10-11 DIAGNOSIS — Z515 Encounter for palliative care: Secondary | ICD-10-CM

## 2017-10-11 DIAGNOSIS — Z7189 Other specified counseling: Secondary | ICD-10-CM

## 2017-10-11 DIAGNOSIS — J9601 Acute respiratory failure with hypoxia: Secondary | ICD-10-CM

## 2017-10-11 LAB — CBC WITH DIFFERENTIAL/PLATELET
Basophils Absolute: 0 10*3/uL (ref 0.0–0.1)
Basophils Relative: 0 %
Eosinophils Absolute: 0 10*3/uL (ref 0.0–0.7)
Eosinophils Relative: 0 %
HCT: 24.6 % — ABNORMAL LOW (ref 36.0–46.0)
HEMOGLOBIN: 8.2 g/dL — AB (ref 12.0–15.0)
Lymphocytes Relative: 12 %
Lymphs Abs: 0.3 10*3/uL — ABNORMAL LOW (ref 0.7–4.0)
MCH: 30.6 pg (ref 26.0–34.0)
MCHC: 33.3 g/dL (ref 30.0–36.0)
MCV: 91.8 fL (ref 78.0–100.0)
MONOS PCT: 29 %
MYELOCYTES: 1 %
Monocytes Absolute: 0.7 10*3/uL (ref 0.1–1.0)
NEUTROS ABS: 1.4 10*3/uL — AB (ref 1.7–7.7)
NRBC: 2 /100{WBCs} — AB
Neutrophils Relative %: 58 %
PLATELETS: 5 10*3/uL — AB (ref 150–400)
RBC: 2.68 MIL/uL — ABNORMAL LOW (ref 3.87–5.11)
RDW: 15.2 % (ref 11.5–15.5)
WBC: 2.4 10*3/uL — ABNORMAL LOW (ref 4.0–10.5)

## 2017-10-11 LAB — TYPE AND SCREEN
ABO/RH(D): A POS
Antibody Screen: NEGATIVE
UNIT DIVISION: 0

## 2017-10-11 LAB — BPAM RBC
BLOOD PRODUCT EXPIRATION DATE: 201908052359
ISSUE DATE / TIME: 201907081602
UNIT TYPE AND RH: 6200

## 2017-10-11 LAB — GLUCOSE, CAPILLARY
GLUCOSE-CAPILLARY: 219 mg/dL — AB (ref 70–99)
Glucose-Capillary: 266 mg/dL — ABNORMAL HIGH (ref 70–99)

## 2017-10-11 MED ORDER — MORPHINE SULFATE (PF) 2 MG/ML IV SOLN
INTRAVENOUS | Status: AC
  Filled 2017-10-11: qty 3

## 2017-10-11 MED ORDER — MIDAZOLAM HCL 2 MG/2ML IJ SOLN
INTRAMUSCULAR | Status: AC
  Filled 2017-10-11: qty 4

## 2017-10-11 MED ORDER — VITAL HIGH PROTEIN PO LIQD: 1000.0000 mL | ORAL | Status: DC

## 2017-10-11 MED ORDER — NOREPINEPHRINE 4 MG/250ML-% IV SOLN
0.0000 ug/min | INTRAVENOUS | Status: DC
Start: 2017-10-11 — End: 2017-10-11
  Administered 2017-10-11: 2 ug/min via INTRAVENOUS
  Filled 2017-10-11: qty 250

## 2017-10-11 MED ORDER — SODIUM CHLORIDE 0.9% IV SOLUTION: Freq: Once | INTRAVENOUS | Status: DC

## 2017-10-11 MED ORDER — FENTANYL CITRATE (PF) 100 MCG/2ML IJ SOLN
INTRAMUSCULAR | Status: AC
  Filled 2017-10-11: qty 2

## 2017-10-11 MED ORDER — SODIUM CHLORIDE 0.9 % IV BOLUS: 250.0000 mL | Freq: Once | INTRAVENOUS | Status: DC

## 2017-10-11 MED ORDER — MORPHINE SULFATE (PF) 4 MG/ML IV SOLN
5.0000 mg | Freq: Once | INTRAVENOUS | Status: AC
  Administered 2017-10-11: 5 mg via INTRAVENOUS
  Filled 2017-10-11: qty 2

## 2017-10-11 MED ORDER — VANCOMYCIN HCL 10 G IV SOLR
1250.0000 mg | Freq: Once | INTRAVENOUS | Status: DC
  Filled 2017-10-11: qty 1250

## 2017-10-11 MED ORDER — SODIUM CHLORIDE 0.9 % IV BOLUS: 500.0000 mL | Freq: Once | INTRAVENOUS | Status: DC

## 2017-10-11 MED ORDER — PIPERACILLIN-TAZOBACTAM 3.375 G IVPB: 3.3750 g | Freq: Three times a day (TID) | INTRAVENOUS | Status: DC

## 2017-10-11 MED ORDER — MIDAZOLAM HCL 2 MG/2ML IJ SOLN
INTRAMUSCULAR | Status: AC
  Filled 2017-10-11: qty 2

## 2017-10-11 MED ORDER — LIDOCAINE HCL (PF) 1 % IJ SOLN
INTRAMUSCULAR | Status: AC | PRN
  Administered 2017-10-11: 20 mL

## 2017-10-11 MED ORDER — FENTANYL CITRATE (PF) 100 MCG/2ML IJ SOLN: 25.0000 ug | INTRAMUSCULAR | Status: DC | PRN

## 2017-10-11 MED ORDER — PRO-STAT SUGAR FREE PO LIQD: 30.0000 mL | Freq: Two times a day (BID) | ORAL | Status: DC

## 2017-10-11 MED ORDER — VANCOMYCIN HCL IN DEXTROSE 750-5 MG/150ML-% IV SOLN: 750.0000 mg | INTRAVENOUS | Status: DC

## 2017-10-11 MED ORDER — MORPHINE SULFATE (PF) 2 MG/ML IV SOLN: 2.0000 mg | INTRAVENOUS | Status: DC | PRN

## 2017-10-11 MED ORDER — FENTANYL CITRATE (PF) 100 MCG/2ML IJ SOLN
INTRAMUSCULAR | Status: AC
  Filled 2017-10-11: qty 4

## 2017-10-11 MED ORDER — HYDROCORTISONE NA SUCCINATE PF 100 MG IJ SOLR: 50.0000 mg | Freq: Four times a day (QID) | INTRAMUSCULAR | Status: DC

## 2017-10-11 MED ORDER — MIDAZOLAM HCL 2 MG/2ML IJ SOLN
INTRAMUSCULAR | Status: AC | PRN
  Administered 2017-10-11: 0.5 mg via INTRAVENOUS

## 2017-10-12 ENCOUNTER — Telehealth: Payer: Self-pay

## 2017-10-12 LAB — PREPARE PLATELET PHERESIS
UNIT DIVISION: 0
Unit division: 0

## 2017-10-12 LAB — BPAM PLATELET PHERESIS
BLOOD PRODUCT EXPIRATION DATE: 201907102359
Blood Product Expiration Date: 201907102359
ISSUE DATE / TIME: 201907091136
ISSUE DATE / TIME: 201907091311
Unit Type and Rh: 6200
Unit Type and Rh: 6200

## 2017-10-12 NOTE — Telephone Encounter (Signed)
Per Dr Marin Olp, pt passed away in the hospital last pm. Flowers sent to pt's daughter Leah Lewis. dph

## 2017-10-26 ENCOUNTER — Encounter (HOSPITAL_COMMUNITY): Payer: Self-pay | Admitting: Hematology & Oncology

## 2017-11-03 NOTE — Progress Notes (Signed)
MD notified about BP systolics 86H and increasing HR 120s-140s afib. No new orders given. Will continue to monitor

## 2017-11-03 NOTE — Progress Notes (Signed)
Pharmacy Antibiotic Note  Leah Lewis is a 83 y.o. female with newly diagnosed AML admitted on 09/25/2017.  Pharmacy has been consulted for vancomycin and zosyn dosing for empiric coverage of sepsis with development of hypotension 7/9.  Plan:  Zosyn 3.375gm IV q8h (4hr extended infusions)  Vancomycin 1250mg  IV x 1, then 750mg  IV q24h for estimated AUC 504  Check vancomycin levels at steady state, goal AUC 400-500  Follow up renal function & cultures  Height: 5\' 4"  (162.6 cm) Weight: 140 lb (63.5 kg) IBW/kg (Calculated) : 54.7  Temp (24hrs), Avg:97.1 F (36.2 C), Min:95.6 F (35.3 C), Max:97.8 F (36.6 C)  Recent Labs  Lab 10/06/17 0316 10/07/17 0411 10/08/17 0451 10/09/17 0423 10/10/17 0941 November 04, 2017 0500  WBC 3.6* 3.7* 2.2* 2.5* 2.7* 2.4*  CREATININE 0.63 0.86 0.77 0.98 0.91  --     Estimated Creatinine Clearance: 41.9 mL/min (by C-G formula based on SCr of 0.91 mg/dL).    Allergies  Allergen Reactions  . Percocet [Oxycodone-Acetaminophen] Other (See Comments)    "patient can take" but prefers not to    Antimicrobials this admission: 6/8 cefepime >> 6/14, resumed 6/24 >> 7/1 6/10 vancomycin >> 6/14 6/14 meropenem >> 6/18 6/19 fluconazole (thrush) >> 6/24 (DC d/t drug-drug interaction with Venetoclax, continue nystatin alone) 7/9 Zosyn >> 7/9 Vanc >>  Microbiology results: 6/3 BCx: NGF 6/8 BCx: NGF 6/10 BCx: NGF 6/10 MRSA PCR: negative  6/15 Bcx: NGF 6/23 BCx x2: neg FINAL 7/9 BCx: 7/9 BCx: 7/9 Sputum:  Thank you for allowing pharmacy to be a part of this patient's care.  Peggyann Juba, PharmD, BCPS Pager: [phone] 11/04/17 4:56 PM

## 2017-11-03 NOTE — Progress Notes (Signed)
Daughters arrived, saw patient, we had an extensive discussion about prognosis and more importantly quality of life.  After a long discussion, decision was made that patient would not want this level of aggressive care and will proceed with DNR status, morphine pushes as needed and d/c levophed.  PCCM will sign off, please call back if needed.  The patient is critically ill with multiple organ systems failure and requires high complexity decision making for assessment and support, frequent evaluation and titration of therapies, application of advanced monitoring technologies and extensive interpretation of multiple databases.   Critical Care Time devoted to patient care services described in this note is  36  Minutes. This time reflects time of care of this signee Dr Jennet Maduro. This critical care time does not reflect procedure time, or teaching time or supervisory time of PA/NP/Med student/Med Resident etc but could involve care discussion time.  Rush Farmer, M.D. [facility] Pulmonary/Critical Care Medicine. Pager: [phone]. After hours pager: [phone].

## 2017-11-03 NOTE — Progress Notes (Signed)
Unfortunately, I just do not see all that much improvement with Leah Lewis.  She still is not eating.  I think this is her biggest problem.  She is supposed to undergo the bone marrow biopsy today.  This will give [place] definitive proof as to whether or not treatment has helped her leukemia.  Her platelet count is 5000 today.  We will go ahead and give her a unit of platelets.  I am going to also check a pre-albumin level on her.  I did speak with both daughters yesterday.  I gave them my assessment.  I think everything will really hinge on the bone marrow report.  She is not hurting.  There is no obvious bleeding.  She is had no temperature.  I think it would not be a bad idea to check her chest x-ray.  She is had no diarrhea.  She is had no rashes.  There is been no nausea or vomiting.  Again, everything is going to really be based upon the results of this bone marrow test.  Hopefully I will have a preliminary report  tomorrow.  If we find that she still has significant leukemic burden, then I will definitely recommend hospice care.  I think we would then find that her prognosis would be less than 4-6 weeks.  Thankfully, she is getting fantastic care from everybody in the ICU.  As always, the staff does a wonderful job taking care of my patients.  [name], MD [name] 41:10

## 2017-11-03 NOTE — Consult Note (Addendum)
PULMONARY / CRITICAL CARE MEDICINE   Name: Leah Lewis MRN: [id number] DOB: 1934/05/07    ADMISSION DATE:  10/02/2017 CONSULTATION DATE:  01-Nov-2017  REFERRING MD:  Dr. Tawanna Solo / [facility]   CHIEF COMPLAINT:  Hypotension   HISTORY OF PRESENT ILLNESS:   83 y/o F with PMH of AML who presented to [facility] on 6/3 reports of being "sick", weight loss & weakness.  Initial work up notable for pancytopenia.  She underwent bone marrow biopsy on 6/5 which showed AML with complex cytogenetics.  Dr. Marin Olp was consulted.  She has received multiple units of PRBC's / platelets during the admission.  Hospital course complicated by [facility].  The patient was started on chemotherapy.  She has continued to deteriorate with chemotherapy with ongoing pancytopenia.  Palliative care consulted given poor prognosis.  Daughters have continued to request aggressive care.  She developed hypotension on 7/9 and PCCM consulted for evaluation.   PAST MEDICAL HISTORY :  She  has a past medical history of AML (acute myeloid leukemia) ([place]) (09/12/2017), Atrial fibrillation ([place]) (09/15/2017), Goals of care, counseling/discussion (09/12/2017), High cholesterol, Hypertension, Stroke [facility]), and Thrush (09/22/2017).  PAST SURGICAL HISTORY: She  has a past surgical history that includes Abdominal hysterectomy; Cesarean section; and Cataract extraction.  Allergies  Allergen Reactions  . Percocet [Oxycodone-Acetaminophen] Other (See Comments)    "patient can take" but prefers not to    No current facility-administered medications on file prior to encounter.    Current Outpatient Medications on File Prior to Encounter  Medication Sig  . alendronate (FOSAMAX) 70 MG tablet Take 70 mg by mouth once a week.  Marland Kitchen aspirin EC 81 MG tablet Take 81 mg by mouth 3 (three) times a week.  Marland Kitchen atorvastatin (LIPITOR) 40 MG tablet Take 40 mg by mouth daily.    Marland Kitchen ketorolac (ACULAR) 0.5 % ophthalmic solution Place 1 drop into both eyes 4 (four) times daily.     . pantoprazole (PROTONIX) 40 MG tablet Take 1 tablet (40 mg total) by mouth daily. (Patient taking differently: Take 40 mg by mouth daily as needed (heartburn). )  . prednisoLONE acetate (PRED FORTE) 1 % ophthalmic suspension Place 1 drop into both eyes 4 (four) times daily.    . Vitamin D, Ergocalciferol, (DRISDOL) 50000 UNITS CAPS capsule Take 50,000 Units by mouth every 30 (thirty) days.  . Rivaroxaban (XARELTO STARTER PACK) 15 & 20 MG TBPK Take as directed on package: Start with one 26m tablet by mouth twice a day with food. On Day 22, switch to one 241mtablet once a day with food. (Patient not taking: Reported on 09/20/2017)    FAMILY HISTORY:  Her family history includes Breast cancer in her sister; Heart attack in her mother; Parkinsonism in her father; Pneumonia in her father.  SOCIAL HISTORY: She  reports that she has never smoked. She quit smokeless tobacco use about 36 years ago. Her smokeless tobacco use included chew. She reports that she does not drink alcohol or use drugs.  REVIEW OF SYSTEMS:  Unable to complete as patient is altered.    SUBJECTIVE:    VITAL SIGNS: BP (!) 72/50   Pulse (!) 138   Temp (!) 95.6 F (35.3 C) (Axillary)   Resp (!) 21   Ht _0  (1.626 m)   Wt 140 lb (63.5 kg)   SpO2 92%   BMI 24.03 kg/m   HEMODYNAMICS:    VENTILATOR SETTINGS:    INTAKE / OUTPUT: I/O last 3 completed shifts: In:  3469.2 [P.O.:440; I.V.:2719.2; Blood:310] Out: 1250 [Urine:1250]  PHYSICAL EXAMINATION: General: ill appearing adult female   Neuro:  Moans with painful stimulation  HEENT:  MM pink/dry, no jvd Cardiovascular:  s1s2 rrr, tachycardia  Lungs:  Diminished bilaterally  Abdomen:  Obese/soft, bsx4 active  Musculoskeletal:  No acute deformities  Skin:  Cool / dry  LABS:  BMET Recent Labs  Lab 10/08/17 0451 10/09/17 0423 10/10/17 0941  NA 139 138 137  K 3.2* 4.4 4.8  CL 102 101 105  CO2 _0 BUN 31* 46* 46*  CREATININE 0.77 0.98 0.91   GLUCOSE 200* 261* 268*    Electrolytes Recent Labs  Lab 10/06/17 0316 10/07/17 0411 10/08/17 0451 10/09/17 0423 10/10/17 0941  CALCIUM 7.1* 6.9* 6.6* 6.9* 6.5*  MG 1.2*  --   --   --   --   PHOS 2.3* 2.9 2.8  --   --     CBC Recent Labs  Lab 10/09/17 0423 10/10/17 0941 November 07, 2017 0500  WBC 2.5* 2.7* 2.4*  HGB 8.2* 7.7* 8.2*  HCT 24.5* 23.3* 24.6*  PLT 9* 10* 5*    Coag's No results for input(s): APTT, INR in the last 168 hours.  Sepsis Markers No results for input(s): LATICACIDVEN, PROCALCITON, O2SATVEN in the last 168 hours.  ABG No results for input(s): PHART, PCO2ART, PO2ART in the last 168 hours.  Liver Enzymes Recent Labs  Lab 10/05/17 0517 10/05/17 1841 10/06/17 0316  AST _1 ALT _2 ALKPHOS 59 62 67  BILITOT 2.9* 2.6* 3.5*  ALBUMIN 1.4* 1.5* 1.5*    Cardiac Enzymes No results for input(s): TROPONINI, PROBNP in the last 168 hours.  Glucose Recent Labs  Lab 10/10/17 0603 10/10/17 1214 10/10/17 1726 10/10/17 2349 11-07-17 0533 11-07-2017 1204  GLUCAP 207* 240* 251* 265* 266* 219*    Imaging Dg Chest 1 View  Result Date: 11-07-17 CLINICAL DATA:  Shortness of breath in a patient with leukemia. EXAM: CHEST  1 VIEW COMPARISON:  Single-view of the chest 10/03/2017 and 09/28/2017. CT chest 09/19/2017. FINDINGS: Right PICC is again seen. There is cardiomegaly. Hazy opacity over the right chest is new since the prior examination and likely due to layering pleural effusion. There is right basilar airspace disease. More extensive airspace disease in left mid and lower lung zones is not grossly changed. IMPRESSION: New hazy opacity over the right chest is likely due to layering pleural effusion. No notable change in airspace disease in the left mid and lower lung zones with new airspace disease in the right base identified which could be atelectasis or pneumonia. Cardiomegaly. Electronically Signed   By: Inge Rise M.D.   On: 11-07-17  14:51   Ct Biopsy  Result Date: 11/07/2017 INDICATION: History of AML, now with profound pancytopenia. Please perform CT-guided bone marrow biopsy for tissue diagnostic purposes. EXAM: CT-GUIDED BONE MARROW BIOPSY AND ASPIRATION MEDICATIONS: None ANESTHESIA/SEDATION: Versed 0.5 mg IV Sedation Time: 10 Minutes; The patient was continuously monitored during the procedure by the interventional radiology nurse under my direct supervision. COMPLICATIONS: None immediate. PROCEDURE: Informed consent was obtained from the patient following an explanation of the procedure, risks, benefits and alternatives. The patient understands, agrees and consents for the procedure. All questions were addressed. A time out was performed prior to the initiation of the procedure. The patient was positioned prone and non-contrast localization CT was performed of the pelvis to demonstrate the iliac marrow spaces. The operative site was prepped and draped  in the usual sterile fashion. Under sterile conditions and local anesthesia, a 22 gauge spinal needle was utilized for procedural planning. Next, an 11 gauge coaxial bone biopsy needle was advanced into the left iliac marrow space. Needle position was confirmed with CT imaging. Initially, bone marrow aspiration was performed. Next, a bone marrow biopsy was obtained with the 11 gauge outer bone marrow device. Samples were prepared with the cytotechnologist and deemed adequate. The needle was removed intact. Hemostasis was obtained with compression and a dressing was placed. The patient tolerated the procedure well without immediate post procedural complication. IMPRESSION: Successful CT guided left iliac bone marrow aspiration and core biopsy. Electronically Signed   By: Sandi Mariscal M.D.   On: 11/07/2017 11:23   Ct Bone Marrow Biopsy & Aspiration  Result Date: 11-07-17 INDICATION: History of AML, now with profound pancytopenia. Please perform CT-guided bone marrow biopsy for tissue  diagnostic purposes. EXAM: CT-GUIDED BONE MARROW BIOPSY AND ASPIRATION MEDICATIONS: None ANESTHESIA/SEDATION: Versed 0.5 mg IV Sedation Time: 10 Minutes; The patient was continuously monitored during the procedure by the interventional radiology nurse under my direct supervision. COMPLICATIONS: None immediate. PROCEDURE: Informed consent was obtained from the patient following an explanation of the procedure, risks, benefits and alternatives. The patient understands, agrees and consents for the procedure. All questions were addressed. A time out was performed prior to the initiation of the procedure. The patient was positioned prone and non-contrast localization CT was performed of the pelvis to demonstrate the iliac marrow spaces. The operative site was prepped and draped in the usual sterile fashion. Under sterile conditions and local anesthesia, a 22 gauge spinal needle was utilized for procedural planning. Next, an 11 gauge coaxial bone biopsy needle was advanced into the left iliac marrow space. Needle position was confirmed with CT imaging. Initially, bone marrow aspiration was performed. Next, a bone marrow biopsy was obtained with the 11 gauge outer bone marrow device. Samples were prepared with the cytotechnologist and deemed adequate. The needle was removed intact. Hemostasis was obtained with compression and a dressing was placed. The patient tolerated the procedure well without immediate post procedural complication. IMPRESSION: Successful CT guided left iliac bone marrow aspiration and core biopsy. Electronically Signed   By: Sandi Mariscal M.D.   On: 11/07/17 11:23     STUDIES:  Bone Marrow Biopsy 7/9 >>   CULTURES: BCx2 7/2 >> negative   ANTIBIOTICS: Vanco 7/9 >>  Zosyn 7/9 >>   SIGNIFICANT EVENTS: 6/03  Admit  7/09  PCCM consulted for hypotension  LINES/TUBES: RUE PICC 6/11 >>   DISCUSSION: 83 y/o F with dementia, newly diagnosed AML (this admission) with ongoing  pancytopenia  ASSESSMENT / PLAN:  PULMONARY A: Right PNA Acute Respiratory Failure with Hypoxia  P:   Family requests intubation  PRVC 8cc/kg  Wean PEEP / FiO2 for sats > 90% Intermittent CXR  ABG post intubation   CARDIOVASCULAR A:  Hypotension  Atrial Fibrillation - prior RVR P:  ICU monitoring  Levophed for MAP > 65 Stress dose steroids   RENAL A:   Hypokalemia  Hypomagnesemia  P:   Trend BMP / urinary output Replace electrolytes as indicated Avoid nephrotoxic agents, ensure adequate renal perfusion  GASTROINTESTINAL A:   Moderate to Severe Protein Calorie Malnutrition  P:   NPO  Insert OGT  Begin TF this evening  HEMATOLOGIC A:   Pancytopenia  LLE DVT  P:  Trend CBC  Await bone marrow results  Dr. Marin Olp following   INFECTIOUS A:  Pneumonia - completed Rx P:   Follow cultures    ENDOCRINE A:   Hyperglycemia    P:   SSI   NEUROLOGIC A:   Acute Metabolic Encephalopathy  P:   RASS goal: 0 to -1  PRN fentanyl / pain & sedation    FAMILY  - Updates: Daughter Girard Cooter) called and updated on patients status.  Family en route.  Even with the addition of mechanical ventilation, this will not likely change Mrs. Maricle's outcome and will only add to her suffering.    - Inter-disciplinary family meet or Palliative Care meeting due by:  Ongoing.      Noe Gens, NP-C [facility] Pgr: [phone] or if no answer [phone] 10-15-2017, 4:00 PM  Attending Note:  83 year old female with history of dementia who presents to the hospital feeling "sick."  Patient has been in the hospital for over a month and had a bone marrow biopsy that showed AML.  Patient was given one dose of palliative chemo and a repeat biopsy was done today and H/O is waiting on results.  In the meantime, I suspect the person developed sepsis, shock and AMS with inability to protect her airway and PCCM was consulted.  Dr. Jonette Eva was unavailable to comment  on his note but spoke with the covering NP and one of Dr. Jonette Eva partners who felt that patient's prognosis is very poor and given her overall function prior to that she has little chance of survival.  With that information, I communicated with the daughter Verlee Rossetti who informed that until Dr. Jonette Eva tells her that the patient has poor prognosis then she would like to continue to have everything done.  She wanted to speak to her siblings however and call us back.  She called back and they would like to see her before we proceed with intubation.  They are on their way in.  On exam, the patient is unresponsive and not following command, agonally breathing with decreased BS diffusely.  On CXR, that I reviewed myself, pulmonary edema noted.  Discussed with PCCM-NP.  Will proceed with intubation when they arrive.  Levophed ordered for BP support.  Pan cultures ordered.  Vanc/zosyn ordered per pharmacy.  Will order a cortisol level and start stress dose steroids as well.  PCCM will continue to follow as consult.  The patient is critically ill with multiple organ systems failure and requires high complexity decision making for assessment and support, frequent evaluation and titration of therapies, application of advanced monitoring technologies and extensive interpretation of multiple databases.   Critical Care Time devoted to patient care services described in this note is  60  Minutes. This time reflects time of care of this signee Dr Jennet Maduro. This critical care time does not reflect procedure time, or teaching time or supervisory time of PA/NP/Med student/Med Resident etc but could involve care discussion time.  Rush Farmer, M.D. [facility]Critical Care Medicine. Pager: [phone]. After hours pager: [phone].

## 2017-11-03 NOTE — Progress Notes (Addendum)
PROGRESS NOTE    Leah Lewis  JKK:[id number] DOB: 05-23-1934 DOA: 09/21/2017 PCP: Wenda Low, MD   Brief Narrative:  Patient is a 83 year old female with past medical history of osteoporosis, hyperlipidemia, DVT of left lower extremity who presented with generalized weakness and wight loss and found to have pancytopenia .She was admitted on 09/20/2017.She underwent bone marrow biopsy on 09/07/17 and it showed AML with complex cytogenetics.Dr. Marin Olp, oncology is following. She has received multiple PRBC and platelet transfusion during this admission.  Her hospital course was also remarkable for development of A. fib with RVR.  She had been started on chemotherapy.  Patient has ongoing issues with pancytopenia: anemia and thrombocytopenia.  She continued to deteriorate despite being on chemotherapy.  Though patient has very poor prognosis, family wanted to continue treatment and chemotherapy.She is a full code for now. Palliative care was also following for discussion of goals of care but it was unsuccessfull.  After extensive discussion with the daughters ,Dr. Marin Olp  is planning for repeat  bone marrow biopsy today.  If the bone marrow biopsy shows significant tumor burden, he is recommending hospice.  Assessment & Plan:   Principal Problem:   AML (acute myeloid leukemia) ([place]) Active Problems:   Pressure injury of skin   Goals of care, counseling/discussion   Advance care planning   Palliative care by specialist   Pancytopenia ([place])   Atrial fibrillation with RVR ([place])   Thrush   Thrombocytopenia ([place])   Advanced care planning/counseling discussion  AML: Started on chemotherapy on 6/24.  Grave prognosis.  Received first cycle of Dacogen from 6/12 to 6/17.  Plan was for second cycle of Dacogen on 7/11 if she remains stable.  Was on  venetoclax which has been discontinued due to thrombocytopenia. Oncology discussed with family again. She follows with Dr. Marin Olp as an outpatient.   Currently the plan is to do a repeat  bone marrow biopsy to determine the leukemia burden .If she has significant leukemia burden then oncology will recommend hospice after discussion with family. I have discussed with the daughter: Ms. Leah Lewis and [name] couple of days ago .  They are interested only on listening from Dr. Marin Olp and will depend upon his recommendation.  Anemia/thrombocytopenia/leucopenia:  Hb this morning was  8.2 and platelets were 5 .We will transfuse her with 12 units of platelets.  Whenever indicated ,she  should be  transfused with irradiated, CMV negative blood.  Total of 9 units of PRBC and 16 units of platelets so far.  Oncology recommending transfusion if hemoglobin less than 8 and platelets less than 10.  Hypotension: Patient noted to be hypotensive since last 2 days.  Was given 1 L of IV normal saline bolus.  This morning her blood pressure was in the range of 90/50 s.She is on maintenance fluid at 75 ml/hr.  Will discontinue all antihypertensives.  A. fib with RVR:  HR better today.Not on anti-coagulation due to thrombocytopenia.  Cardiology was consulted but have signed off .  Cardiology did not want to start her on  amiodarone drip or digoxin.  Recommended continuing IV metoprolol if needed.  Right middle lobe pneumonia:  She had completed the course of antibiotics.  Auscultation of the chest revealed bilateral basilar crackles.  Will hold the fluids for now and check the chest x-ray.  Oral thrush: Continue nystatin.  Acute respiratory failure with hypoxia: In the setting of pneumonia.  Pneumonia was treated with antibiotics.  Last chest x-ray showed improved infiltrates.  Respiratory status has improved.    Diabetes mellitus: Continue sliding scale insulin.  Hypokalemia/hypomagnesemia: Currently being supplemented.  Malnutrition: Nutrition team following.  Nutrition team was recommending feeding tube.  Unsafe condition to place feeding tube due to severe  thrombocytopenia.  Essential hypertension: Currently BP on lower side.  Left lower extremity edema: This could be associated with her previous  left lower extremity DVT.  She is not a candidate for any kind of anticoagulation because of severe thrombocytopenia and her current status .  I will not order any venous duplex.   Addendum: Patient became unresponsive later this afternoon.  ICU consulted.  Dr. Nelda Marseille discussed with the family members and the [place] has been changed to DNR.  There is no plan for any aggressive treatment.  Hopefully Dr. Marin Olp will discuss with the family tomorrow morning and transition her care to full comfort.  DVT prophylaxis:SCD Code Status: Full Family Communication: Daughters on phone Disposition Plan: Unknown at this point.  Likely hospice.   Consultants: Oncology, cardiology  Procedures: None  Antimicrobials: None  Subjective: Patient seen and examined the bedside this morning.  Remains very weak and lethargic this morning.  BP soft.  She is undergoing  bone marrow biopsy today. Objective: Vitals:   10-29-2017 0400 10/29/2017 0500 Oct 29, 2017 0600 10-29-17 0800  BP: 102/72 (!) 81/56 (!) 96/51   Pulse:      Resp: '19 19 17   '$ Temp:    (!) 97.5 F (36.4 C)  TempSrc:    Axillary  SpO2: 90% 96%    Weight:  63.5 kg (140 lb)    Height:        Intake/Output Summary (Last 24 hours) at 29-Oct-2017 0907 Last data filed at 2017-10-29 0600 Gross per 24 hour  Intake 1829.17 ml  Output 800 ml  Net 1029.17 ml   Filed Weights   10/09/17 0421 10/10/17 0500 Oct 29, 2017 0500  Weight: 66.2 kg (146 lb) 63.5 kg (140 lb) 63.5 kg (140 lb)    Examination:   General exam: Chronically ill looking, lethargic HEENT:PERRL,Oral mucosa moist, Ear/Nose normal on gross exam Respiratory system: Bilateral decreased air entry, basilar crackles  cardiovascular system: A. fib , No JVD, murmurs, rubs, gallops or clicks. Gastrointestinal system: Abdomen is mildly distended, soft  and nontender. No organomegaly or masses felt. Normal bowel sounds heard. Central nervous system: Alert but  oriented only to self. No focal neurological deficits. Extremities: Lower extremity edema, no clubbing ,no cyanosis, distal peripheral pulses palpable.PICC line on the right upper extremity. Skin: No rashes, lesions or ulcers,no icterus ,no pallor   Data Reviewed: I have personally reviewed following labs and imaging studies  CBC: Recent Labs  Lab 10/07/17 0411 10/08/17 0451 10/09/17 0423 10/10/17 0941 Oct 29, 2017 0500  WBC 3.7* 2.2* 2.5* 2.7* 2.4*  NEUTROABS 2.2 1.5* 1.4* 1.8 1.4*  HGB 9.1* 8.4* 8.2* 7.7* 8.2*  HCT 27.3* 25.2* 24.5* 23.3* 24.6*  MCV 90.7 90.3 90.7 91.4 91.8  PLT 8* 10* 9* 10* 5*   Basic Metabolic Panel: Recent Labs  Lab 10/05/17 0517 10/05/17 1841 10/06/17 0316 10/07/17 0411 10/08/17 0451 10/09/17 0423 10/10/17 0941  NA 139 141 142 139 139 138 137  K 4.5 3.4* 3.0* 3.8 3.2* 4.4 4.8  CL 103 103 103 101 102 101 105  CO2 '29 29 30 27 28 28 25  '$ GLUCOSE 174* 272* 146* 209* 200* 261* 268*  BUN 22 25* 21 25* 31* 46* 46*  CREATININE 0.63 0.78 0.63 0.86 0.77 0.98 0.91  CALCIUM  6.6* 6.8* 7.1* 6.9* 6.6* 6.9* 6.5*  MG  --   --  1.2*  --   --   --   --   PHOS 2.4* 2.7 2.3* 2.9 2.8  --   --    GFR: Estimated Creatinine Clearance: 41.9 mL/min (by C-G formula based on SCr of 0.91 mg/dL). Liver Function Tests: Recent Labs  Lab 10/05/17 0517 10/05/17 1841 10/06/17 0316  AST '22 27 24  '$ ALT '23 24 25  '$ ALKPHOS 59 62 67  BILITOT 2.9* 2.6* 3.5*  PROT 5.0* 5.3* 5.6*  ALBUMIN 1.4* 1.5* 1.5*   No results for input(s): LIPASE, AMYLASE in the last 168 hours. No results for input(s): AMMONIA in the last 168 hours. Coagulation Profile: No results for input(s): INR, PROTIME in the last 168 hours. Cardiac Enzymes: No results for input(s): CKTOTAL, CKMB, CKMBINDEX, TROPONINI in the last 168 hours. BNP (last 3 results) No results for input(s): PROBNP in the last 8760  hours. HbA1C: No results for input(s): HGBA1C in the last 72 hours. CBG: Recent Labs  Lab 10/10/17 0603 10/10/17 1214 10/10/17 1726 10/10/17 2349 2017-10-12 0533  GLUCAP 207* 240* 251* 265* 266*   Lipid Profile: No results for input(s): CHOL, HDL, LDLCALC, TRIG, CHOLHDL, LDLDIRECT in the last 72 hours. Thyroid Function Tests: No results for input(s): TSH, T4TOTAL, FREET4, T3FREE, THYROIDAB in the last 72 hours. Anemia Panel: No results for input(s): VITAMINB12, FOLATE, FERRITIN, TIBC, IRON, RETICCTPCT in the last 72 hours. Sepsis Labs: No results for input(s): PROCALCITON, LATICACIDVEN in the last 168 hours.  Recent Results (from the past 240 hour(s))  Culture, blood (routine x 2)     Status: None   Collection Time: 10/04/17 12:59 PM  Result Value Ref Range Status   Specimen Description   Final    BLOOD LEFT HAND Performed at [facility] [address] [place], [place] [address]    Special Requests   Final    BOTTLES DRAWN AEROBIC ONLY Blood Culture results may not be optimal due to an inadequate volume of blood received in culture bottles Performed at [facility] [address], [place], [place] [address]    Culture   Final    NO GROWTH 5 DAYS Performed at [facility], [place] [address], [place], [place] [address]    Report Status 10/09/2017 FINAL  Final  Culture, blood (routine x 2)     Status: None   Collection Time: 10/04/17  1:04 PM  Result Value Ref Range Status   Specimen Description   Final    BLOOD LEFT FOREARM Performed at [facility] [address], [place], [place] [address]    Special Requests   Final    BOTTLES DRAWN AEROBIC ONLY Blood Culture results may not be optimal due to an inadequate volume of blood received in culture bottles Performed at [facility] [address], [place], [place] [address]    Culture   Final    NO GROWTH 5 DAYS Performed at [facility], [place] [address] [place],  [address]    Report Status 10/09/2017 FINAL  Final         Radiology Studies: No results found.      Scheduled Meds: . sodium chloride   Intravenous Once  . Chlorhexidine Gluconate Cloth  6 each Topical Daily  . collagenase   Topical Daily  . dronabinol  2.5 mg Oral BID AC  . feeding supplement (ENSURE  ENLIVE)  237 mL Oral TID BM  . insulin aspart  0-9 Units Subcutaneous Q6H  . ketorolac  1 drop Both Eyes QID  . lidocaine  1 patch Transdermal QAC breakfast  . magnesium oxide  400 mg Oral Daily  . mouth rinse  15 mL Mouth Rinse BID  . megestrol  400 mg Oral BID  . mirtazapine  15 mg Oral QHS  . multivitamin with minerals  1 tablet Oral Daily  . nystatin  5 mL Oral Q8H  . polyethylene glycol  17 g Oral BID  . potassium chloride  40 mEq Oral Daily  . prednisoLONE acetate  1 drop Both Eyes QID  . sodium chloride flush  10-40 mL Intracatheter Q12H   Continuous Infusions:    LOS: 35 days    Time spent: 35 mins.     Shelly Coss, MD [facility] Pager [phone]  If 7PM-7AM, please contact night-coverage [email or web address] Password [email or web address] 2017/10/22, 9:07 AM

## 2017-11-03 NOTE — Discharge Summary (Signed)
Death Summary  Leah Lewis SAY:[id number] DOB: [birth year] DOA: 09-11-2017  PCP: Wenda Low, MD  Admit date: 11-Sep-2017 Date of Death: 10/25/2017 Time of Death: 66 Notification:    History of present illness:   Patient is a 83 year old female with past medical history of osteoporosis, hyperlipidemia, DVT of left lower extremity who presented with generalized weakness and wight loss and found to have pancytopenia .She was admitted on September 11, 2017.She underwent bone marrow biopsy on 09/07/17 and it showed AML with complex cytogenetics.Dr. Marin Olp, oncology is following. She has received multiple PRBC and platelet transfusion during this admission.  Her hospital course was also remarkable for development of A. fib with RVR.  She had been started on chemotherapy.  Patient has ongoing issues with pancytopenia: anemia and thrombocytopenia.  She continued to deteriorate despite being on chemotherapy.  Though patient has very poor prognosis, family wanted to continue treatment and chemotherapy.She is a full code for now. Palliative care was also following for discussion of goals of care but it was unsuccessfull.  After extensive discussion with the daughters ,Dr. Marin Olp planned for repeat  bone marrow biopsy . She underwent BMB. Later during the day, patient became hypotensive.  PCCM was consulted and patient was made DNR and the plan was to initiate comfort care.  Patient expired around 34.   Final Diagnoses:  1.   AML   The results of significant diagnostics from this hospitalization (including imaging, microbiology, ancillary and laboratory) are listed below for reference.    Significant Diagnostic Studies: Dg Chest 1 View  Result Date: 2017/10/17 CLINICAL DATA:  Shortness of breath in a patient with leukemia. EXAM: CHEST  1 VIEW COMPARISON:  Single-view of the chest 10/03/2017 and 09/28/2017. CT chest 09/19/2017. FINDINGS: Right PICC is again seen. There is cardiomegaly. Hazy opacity over the  right chest is new since the prior examination and likely due to layering pleural effusion. There is right basilar airspace disease. More extensive airspace disease in left mid and lower lung zones is not grossly changed. IMPRESSION: New hazy opacity over the right chest is likely due to layering pleural effusion. No notable change in airspace disease in the left mid and lower lung zones with new airspace disease in the right base identified which could be atelectasis or pneumonia. Cardiomegaly. Electronically Signed   By: Inge Rise M.D.   On: Oct 17, 2017 14:51   Dg Chest 1 View  Result Date: 09/25/2017 CLINICAL DATA:  Fever. EXAM: CHEST  1 VIEW COMPARISON:  09/21/2017. FINDINGS: Stable enlarged cardiac silhouette. Stable patchy opacity in the left mid lung zone with mildly increased patchy opacity in the left lower lung zone. Interval small amount of patchy opacity beneath the minor fissure on the right. Stable mildly prominent interstitial markings. Thoracic spine degenerative changes. Right PICC tip in the inferior aspect of the superior vena cava approximately 1.5 cm above the superior cavoatrial junction. IMPRESSION: 1. Mildly increased left lung pneumonia. 2. Interval small amount of probable pneumonia in the right mid to lower lung zone, possibly in the right middle lobe. 3. Stable cardiomegaly and mild chronic interstitial lung disease. Electronically Signed   By: Claudie Revering M.D.   On: 09/25/2017 21:02   Ct Chest Wo Contrast  Result Date: 09/19/2017 CLINICAL DATA:  Generalized weakness and fatigue. Fever, anemia. AML. EXAM: CT CHEST WITHOUT CONTRAST TECHNIQUE: Multidetector CT imaging of the chest was performed following the standard protocol without IV contrast. COMPARISON:  None. FINDINGS: Cardiovascular: Right PICC tip terminates junction at the SVC.  Atherosclerotic calcification of the arterial vasculature, including coronary arteries. Pulmonary arteries and heart are enlarged. No  pericardial effusion. Mediastinum/Nodes: Thyroid is heterogeneous. Mediastinal lymph nodes measure up to 10 mm in the low right paratracheal station. Hilar regions are difficult to evaluate without IV contrast. Subpectoral and axillary lymph nodes are not enlarged by CT size criteria. Esophagus is grossly unremarkable. Lungs/Pleura: Image quality is rather degraded by respiratory motion. There is perihilar peribronchovascular ground-glass and consolidation with peripheral sparing. Septal thickening is not an overwhelming feature. No pleural fluid. Airway is unremarkable. Upper Abdomen: Visualized portions of the liver, adrenal glands, kidneys, spleen, pancreas, stomach and bowel are grossly unremarkable. Musculoskeletal: Degenerative changes in the spine. No worrisome lytic or sclerotic lesions. IMPRESSION: 1. Perihilar peribronchovascular ground-glass and consolidation with peripheral sparing. Findings may be due to leukostasis related to AML, pulmonary hemorrhage or pulmonary edema. 2. Borderline enlarged mediastinal lymph nodes. 3. Aortic atherosclerosis (ICD10-170.0). Coronary artery calcification. 4. Enlarged pulmonary arteries, indicative of pulmonary arterial hypertension. Electronically Signed   By: Lorin Picket M.D.   On: 09/19/2017 16:56   Ct Biopsy  Result Date: 2017/10/31 INDICATION: History of AML, now with profound pancytopenia. Please perform CT-guided bone marrow biopsy for tissue diagnostic purposes. EXAM: CT-GUIDED BONE MARROW BIOPSY AND ASPIRATION MEDICATIONS: None ANESTHESIA/SEDATION: Versed 0.5 mg IV Sedation Time: 10 Minutes; The patient was continuously monitored during the procedure by the interventional radiology nurse under my direct supervision. COMPLICATIONS: None immediate. PROCEDURE: Informed consent was obtained from the patient following an explanation of the procedure, risks, benefits and alternatives. The patient understands, agrees and consents for the procedure. All  questions were addressed. A time out was performed prior to the initiation of the procedure. The patient was positioned prone and non-contrast localization CT was performed of the pelvis to demonstrate the iliac marrow spaces. The operative site was prepped and draped in the usual sterile fashion. Under sterile conditions and local anesthesia, a 22 gauge spinal needle was utilized for procedural planning. Next, an 11 gauge coaxial bone biopsy needle was advanced into the left iliac marrow space. Needle position was confirmed with CT imaging. Initially, bone marrow aspiration was performed. Next, a bone marrow biopsy was obtained with the 11 gauge outer bone marrow device. Samples were prepared with the cytotechnologist and deemed adequate. The needle was removed intact. Hemostasis was obtained with compression and a dressing was placed. The patient tolerated the procedure well without immediate post procedural complication. IMPRESSION: Successful CT guided left iliac bone marrow aspiration and core biopsy. Electronically Signed   By: Sandi Mariscal M.D.   On: Oct 31, 2017 11:23   Dg Chest Port 1 View  Result Date: 10/03/2017 CLINICAL DATA:  Acute respiratory failure EXAM: PORTABLE CHEST 1 VIEW COMPARISON:  09/28/2017 FINDINGS: The cardiac shadow is again enlarged. Right-sided PICC line is again seen. Patchy infiltrate is noted in the left upper lung but improved from the prior exam. The right lung is now clear. No bony abnormality is noted. IMPRESSION: Improving infiltrates with mild residual on the left. Electronically Signed   By: Inez Catalina M.D.   On: 10/03/2017 14:00   Dg Chest Port 1 View  Result Date: 09/28/2017 CLINICAL DATA:  Follow-up pneumonia EXAM: PORTABLE CHEST 1 VIEW COMPARISON:  09/25/2017 FINDINGS: Cardiac shadow remains enlarged. Lungs are well aerated bilaterally. Persistent left-sided infiltrate and mild right basilar infiltrate is noted. No significant interval change is noted. No effusion is  seen. Degenerative changes of the thoracic spine are noted. Right-sided PICC line is again  noted and stable. IMPRESSION: Stable bilateral infiltrates left greater than right. Electronically Signed   By: Inez Catalina M.D.   On: 09/28/2017 09:22   Dg Chest Port 1 View  Result Date: 09/21/2017 CLINICAL DATA:  Shortness of breath and fever. Acute myeloid leukemia, atrial fibrillation. EXAM: PORTABLE CHEST 1 VIEW COMPARISON:  Chest x-ray of September 17, 2017 and chest CT scan of September 19, 2017. FINDINGS: The lungs are adequately inflated. The new confluent airspace opacities are present greatest on the left. There is no pleural effusion or pneumothorax. The heart is enlarged. The pulmonary vascularity is indistinct but definite cephalization is not observed. There is calcification in the wall of the thoracic aorta. The right-sided PICC line tip projects over the midportion of the SVC. IMPRESSION: Interval development of airspace opacities greatest on the left most compatible with pneumonia. Underlying low-grade CHF with stable cardiomegaly. Thoracic aortic atherosclerosis. Electronically Signed   By: David  Lewis M.D.   On: 09/21/2017 08:20   Dg Shoulder Right Port  Result Date: 09/20/2017 CLINICAL DATA:  Right shoulder pain with difficulty raising the arm. No known injury. EXAM: PORTABLE RIGHT SHOULDER COMPARISON:  Limited views of the right shoulder from a scout radiograph of the CT scan of the chest of September 19, 2017 FINDINGS: The bones are subjectively adequately mineralized. The glenohumeral joint space is grossly normal. There is moderate narrowing of the AC joint. The subacromial subdeltoid space is normal. There is a small amount of calcification in the region of the insertion of the rotator cuff on the greater tuberosity of the humerus. The observed portions of the right lung exhibit increased interstitial and patchy airspace opacities. IMPRESSION: Degenerative change of the right shoulder centered on the [facility]  joint. No acute bony abnormality. Electronically Signed   By: David  Lewis M.D.   On: 09/20/2017 09:04   Ct Bone Marrow Biopsy & Aspiration  Result Date: 2017/10/19 INDICATION: History of AML, now with profound pancytopenia. Please perform CT-guided bone marrow biopsy for tissue diagnostic purposes. EXAM: CT-GUIDED BONE MARROW BIOPSY AND ASPIRATION MEDICATIONS: None ANESTHESIA/SEDATION: Versed 0.5 mg IV Sedation Time: 10 Minutes; The patient was continuously monitored during the procedure by the interventional radiology nurse under my direct supervision. COMPLICATIONS: None immediate. PROCEDURE: Informed consent was obtained from the patient following an explanation of the procedure, risks, benefits and alternatives. The patient understands, agrees and consents for the procedure. All questions were addressed. A time out was performed prior to the initiation of the procedure. The patient was positioned prone and non-contrast localization CT was performed of the pelvis to demonstrate the iliac marrow spaces. The operative site was prepped and draped in the usual sterile fashion. Under sterile conditions and local anesthesia, a 22 gauge spinal needle was utilized for procedural planning. Next, an 11 gauge coaxial bone biopsy needle was advanced into the left iliac marrow space. Needle position was confirmed with CT imaging. Initially, bone marrow aspiration was performed. Next, a bone marrow biopsy was obtained with the 11 gauge outer bone marrow device. Samples were prepared with the cytotechnologist and deemed adequate. The needle was removed intact. Hemostasis was obtained with compression and a dressing was placed. The patient tolerated the procedure well without immediate post procedural complication. IMPRESSION: Successful CT guided left iliac bone marrow aspiration and core biopsy. Electronically Signed   By: Sandi Mariscal M.D.   On: 19-Oct-2017 11:23    Microbiology: No results found for this or any  previous visit (from the past 240 hour(s)).  Labs: Basic Metabolic Panel: No results for input(s): NA, K, CL, CO2, GLUCOSE, BUN, CREATININE, CALCIUM, MG, PHOS in the last 168 hours. Liver Function Tests: No results for input(s): AST, ALT, ALKPHOS, BILITOT, PROT, ALBUMIN in the last 168 hours. No results for input(s): LIPASE, AMYLASE in the last 168 hours. No results for input(s): AMMONIA in the last 168 hours. CBC: No results for input(s): WBC, NEUTROABS, HGB, HCT, MCV, PLT in the last 168 hours. Cardiac Enzymes: No results for input(s): CKTOTAL, CKMB, CKMBINDEX, TROPONINI in the last 168 hours. D-Dimer No results for input(s): DDIMER in the last 72 hours. BNP: Invalid input(s): POCBNP CBG: No results for input(s): GLUCAP in the last 168 hours. Anemia work up No results for input(s): VITAMINB12, FOLATE, FERRITIN, TIBC, IRON, RETICCTPCT in the last 72 hours. Urinalysis    Component Value Date/Time   COLORURINE YELLOW 09/25/2017 1829   APPEARANCEUR HAZY (A) 09/25/2017 1829   LABSPEC 1.013 09/25/2017 1829   PHURINE 6.0 09/25/2017 1829   GLUCOSEU 150 (A) 09/25/2017 1829   HGBUR MODERATE (A) 09/25/2017 1829   BILIRUBINUR NEGATIVE 09/25/2017 1829   KETONESUR 5 (A) 09/25/2017 1829   PROTEINUR 30 (A) 09/25/2017 1829   UROBILINOGEN 1.0 05/31/2010 2325   NITRITE NEGATIVE 09/25/2017 1829   LEUKOCYTESUR NEGATIVE 09/25/2017 1829   Sepsis Labs Invalid input(s): PROCALCITONIN,  WBC,  LACTICIDVEN     SIGNED:  Shelly Coss, MD  [facility] 10/19/2017, 8:32 AM Pager [phone]  If 7PM-7AM, please contact night-coverage [email or web address] Password [email or web address]

## 2017-11-03 NOTE — Significant Event (Addendum)
Patient became hypotensive this afternoon.  I have discussed with ICU about the need of  pressures to maintain  MAP of around 65 mmHg. She has a PICC line on her right arm.I tried to discuss the current situation with her daugher Ms [name].  I explained that she is  very sick and she might need vasopressor support to maintain her blood pressure .  At this time, her daughter wants everything to be done including the pressure support.

## 2017-11-03 NOTE — Procedures (Signed)
Pre-procedure Diagnosis: AML Post-procedure Diagnosis: Same  Technically successful CT guided bone marrow aspiration and biopsy of left iliac crest.   Complications: None Immediate  EBL: None  SignedSandi Mariscal Pager: [phone] 10/22/2017, 11:02 AM

## 2017-11-03 DEATH — deceased

## 2019-10-06 IMAGING — US IR PICC >5YO
1 series · 1 of 1 positions shown · non-contrast
Comparison: none

INDICATION: 83-year-old female referred for PICC for AML.

[Series 1: ir picc >5yo · 0.05mm/px · 1 of 1 slices shown]
[im 1/1]
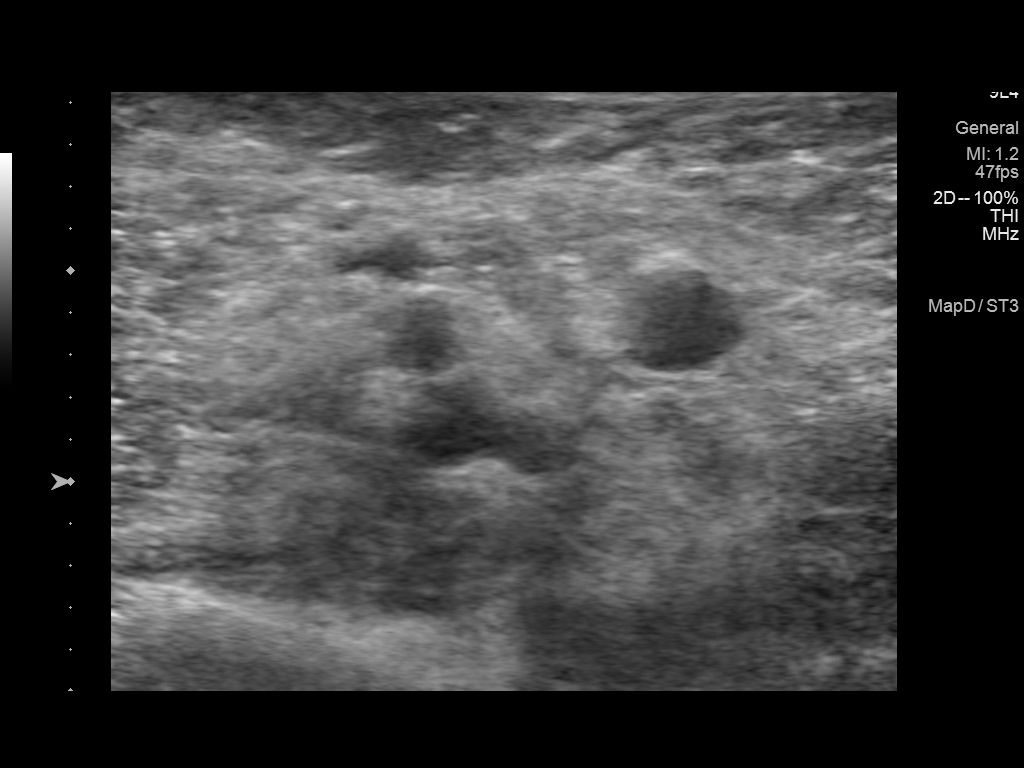

[1 of 1 positions shown; findings below may reference images not displayed]

EXAM:
PICC LINE PLACEMENT WITH ULTRASOUND AND FLUOROSCOPIC GUIDANCE

MEDICATIONS:
None

ANESTHESIA/SEDATION:
None

FLUOROSCOPY TIME:  Fluoroscopy Time: 0 minutes 12 seconds (0.9 mGy).

COMPLICATIONS:
None

PROCEDURE:
Informed written consent was obtained from the patient after a
thorough discussion of the procedural risks, benefits and
alternatives. All questions were addressed. Maximal Sterile Barrier
Technique was utilized including caps, mask, sterile gowns, sterile
gloves, sterile drape, hand hygiene and skin antiseptic. A timeout
was performed prior to the initiation of the procedure.

Patient was position in the supine position on the fluoroscopy table
with the right arm abducted 90 degrees. Ultrasound survey of the
upper extremity was performed with images stored and sent to PACs.

The right basilic vein was selected for access.

Once the patient was prepped and draped in the usual sterile
fashion, the skin and subcutaneous tissues were generously
infiltrated with 1% lidocaine for local anesthesia.

A micropuncture access kit was then used to access the targeted
vein. Wire was passed centrally, confirmed to be within the venous
system under fluoroscopy. A small stab incision was made with an 11
blade scalpel and the sheath was then placed over the wire.
Estimated length of the catheter was then performed with the
indwelling wire.

Catheter was amputated at 37 cm length and placed with coaxial wire
through the peel-away.

Double-lumen, power injectable PICC in the basilic vein. Tip
confirmed at the cavoatrial junction, and the catheter is ready for
use.

Stat lock was placed.

Patient tolerated the procedure well and remained hemodynamically
stable throughout.

No complications were encountered and no significant blood loss was
encountered.
IMPRESSION: Status post right upper extremity PICC.  Catheter ready for use.

## 2019-10-10 IMAGING — DX DG CHEST 1V PORT
1 series · 1 of 1 positions shown · non-contrast
Comparison: 09/12/2017

CLINICAL DATA: Fever

EXAM:
PORTABLE CHEST 1 VIEW

[chest ap]
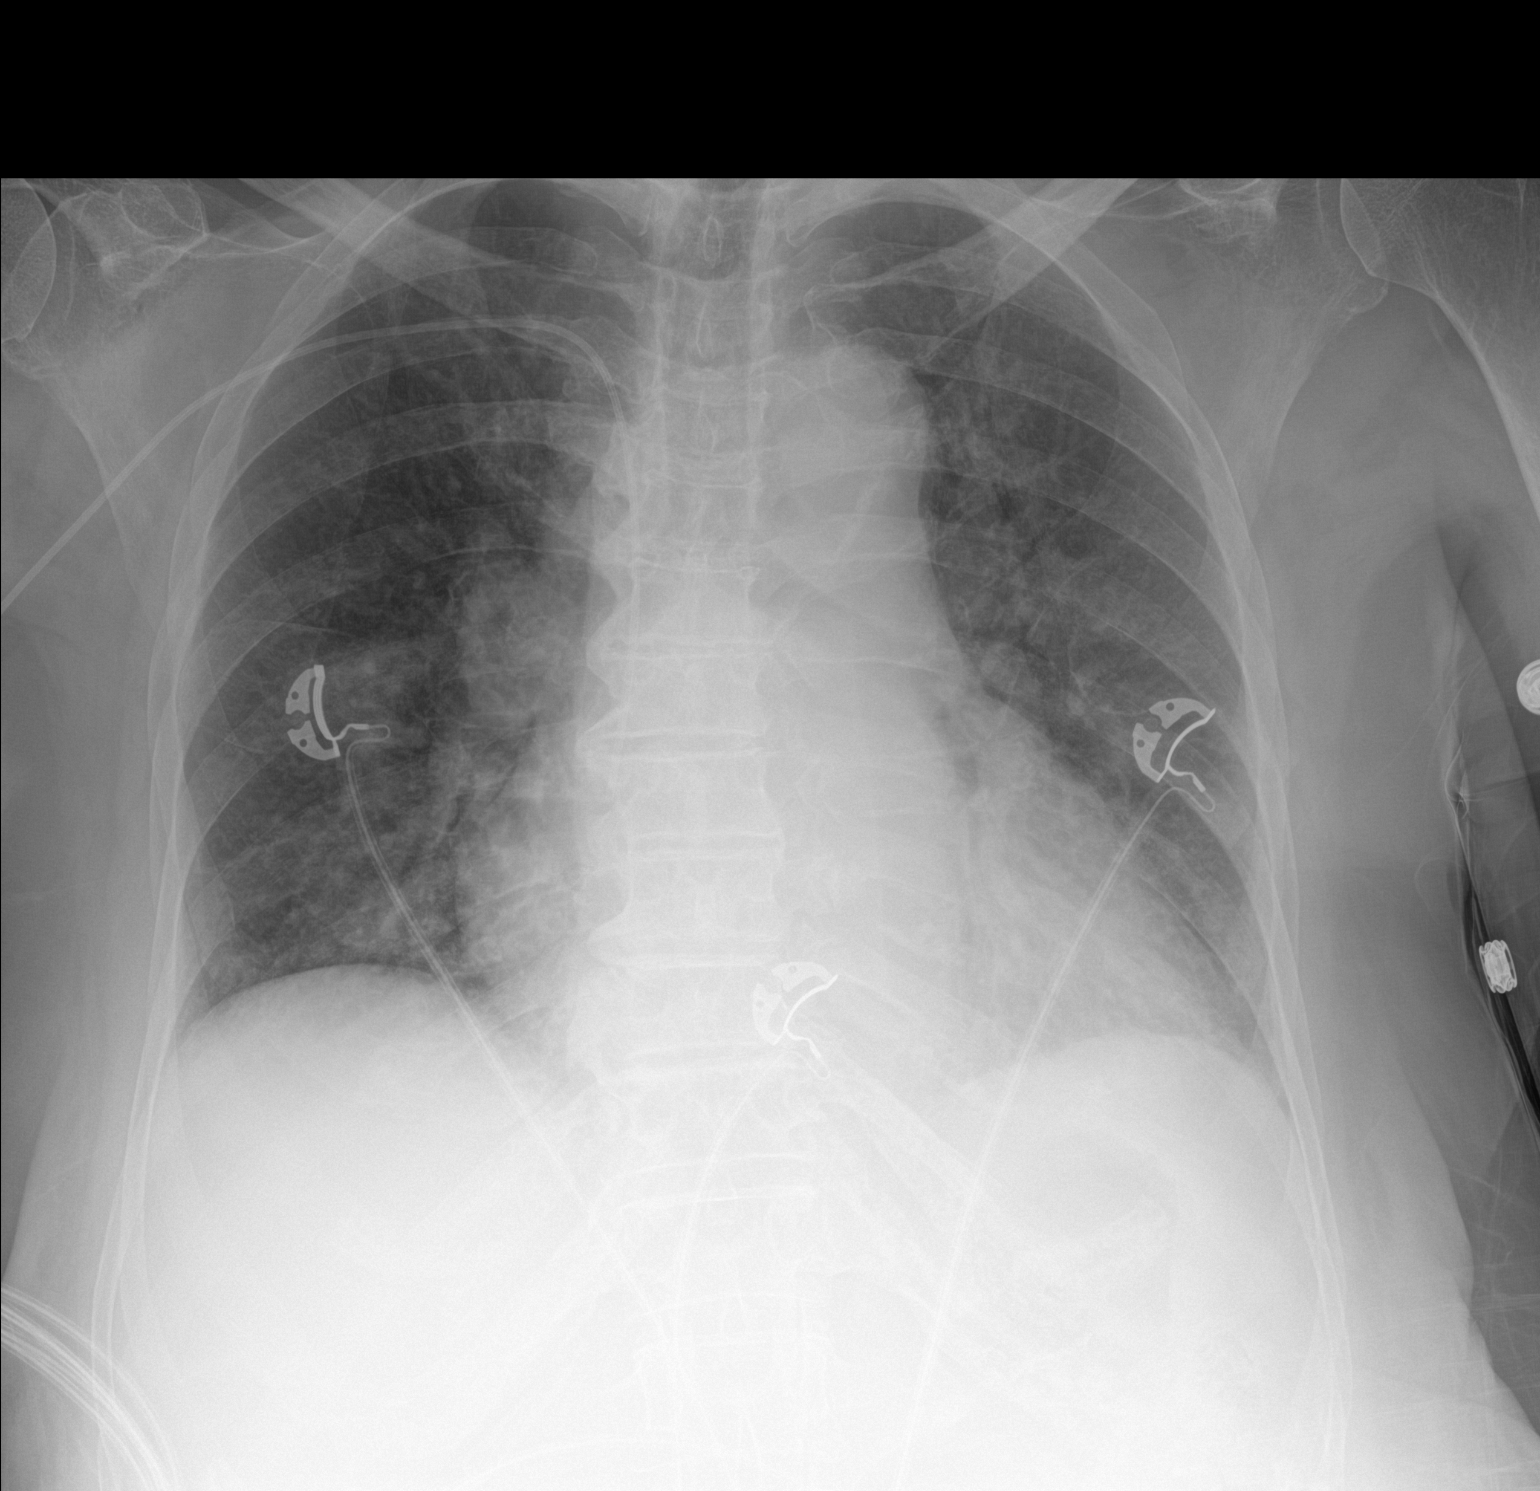

[1 of 1 positions shown; findings below may reference images not displayed]

FINDINGS: There is cardiomegaly. Vascular congestion. Bilateral patchy
airspace opacities, left greater than right. This could reflect
asymmetric edema or infection. No effusions. No acute bony
abnormality.

Right PICC line is in place with the tip at the cavoatrial junction.
IMPRESSION: Patchy bilateral airspace disease, left greater than right could
reflect asymmetric edema or infection.

## 2019-10-13 IMAGING — DX DG SHOULDER 2+V PORT*R*
3 series · 3 of 3 positions shown · non-contrast
Comparison: Limited views of the right shoulder from a scout
radiograph of the CT scan of the chest September 19, 2017

CLINICAL DATA: Right shoulder pain with difficulty raising the arm.
No known injury.

EXAM:
PORTABLE RIGHT SHOULDER

[shoulder ap (1 of 2)]
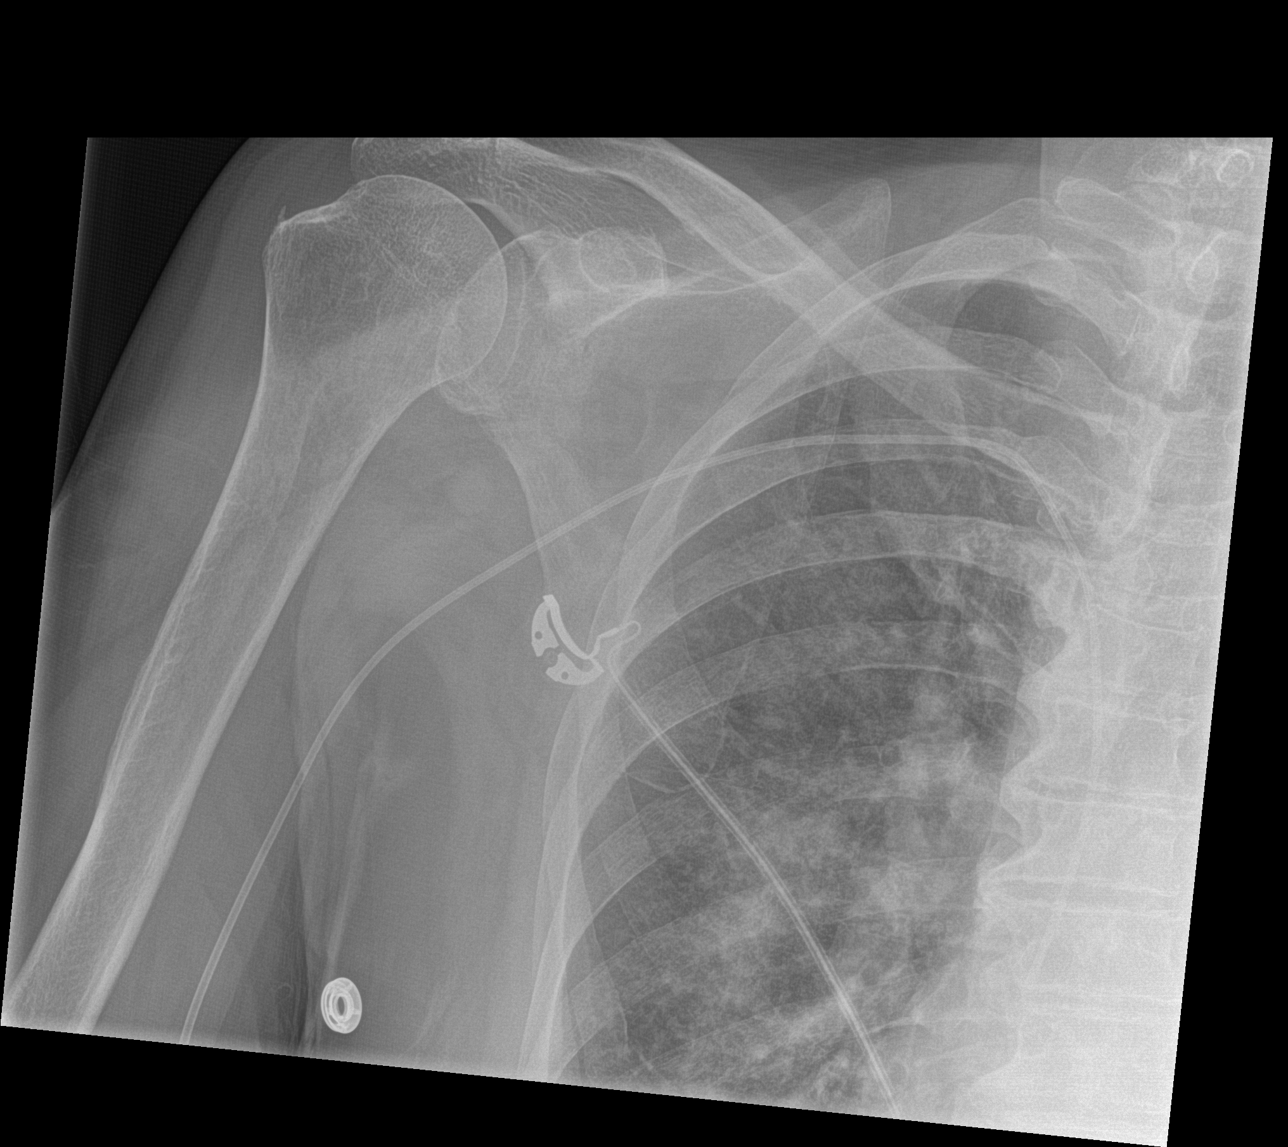

[shoulder ap (2 of 2)]
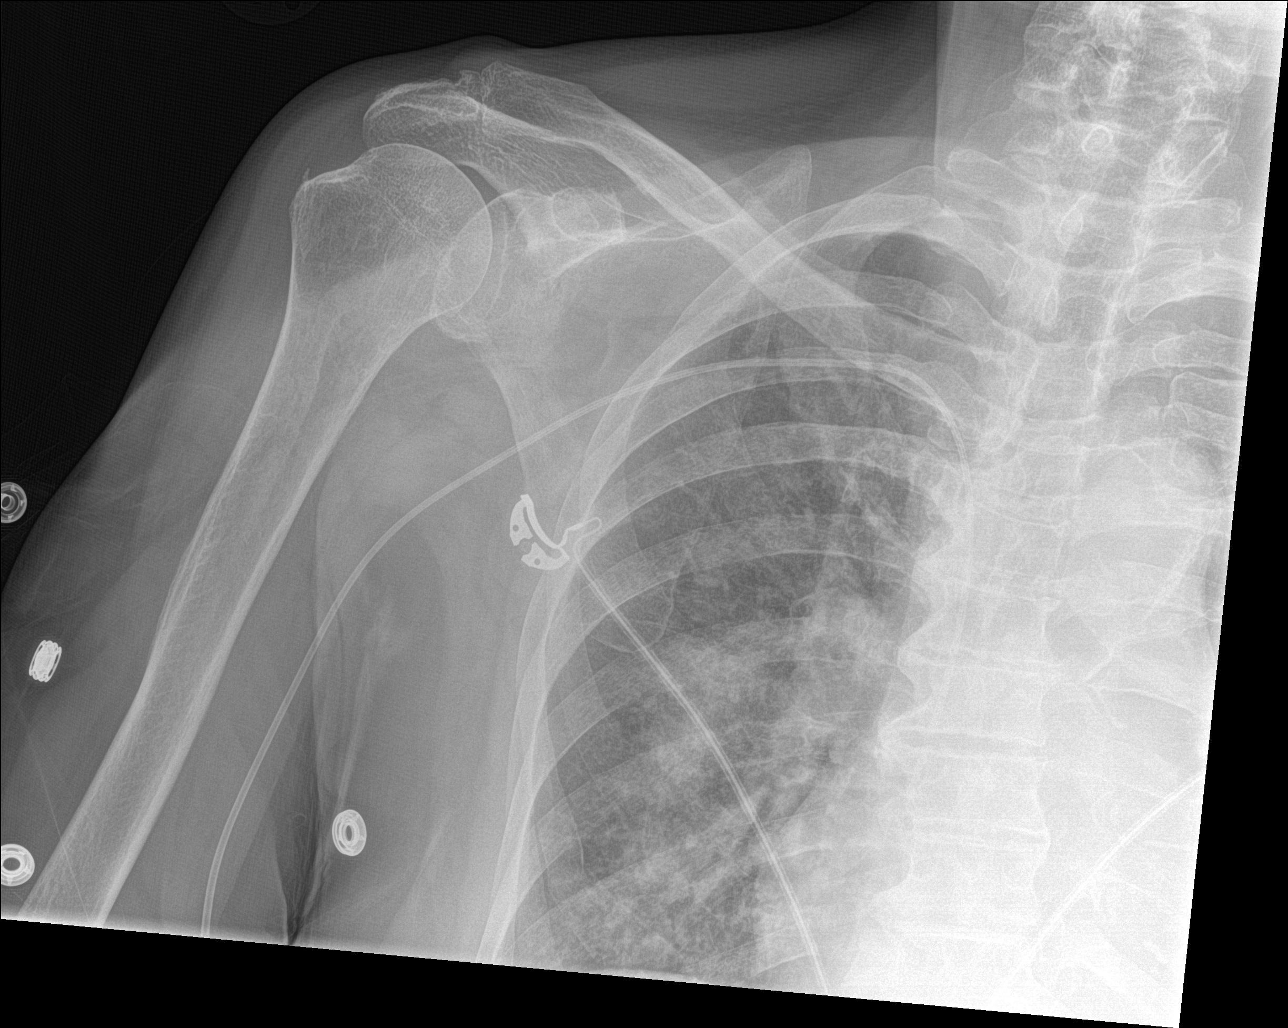

[shoulder obl]
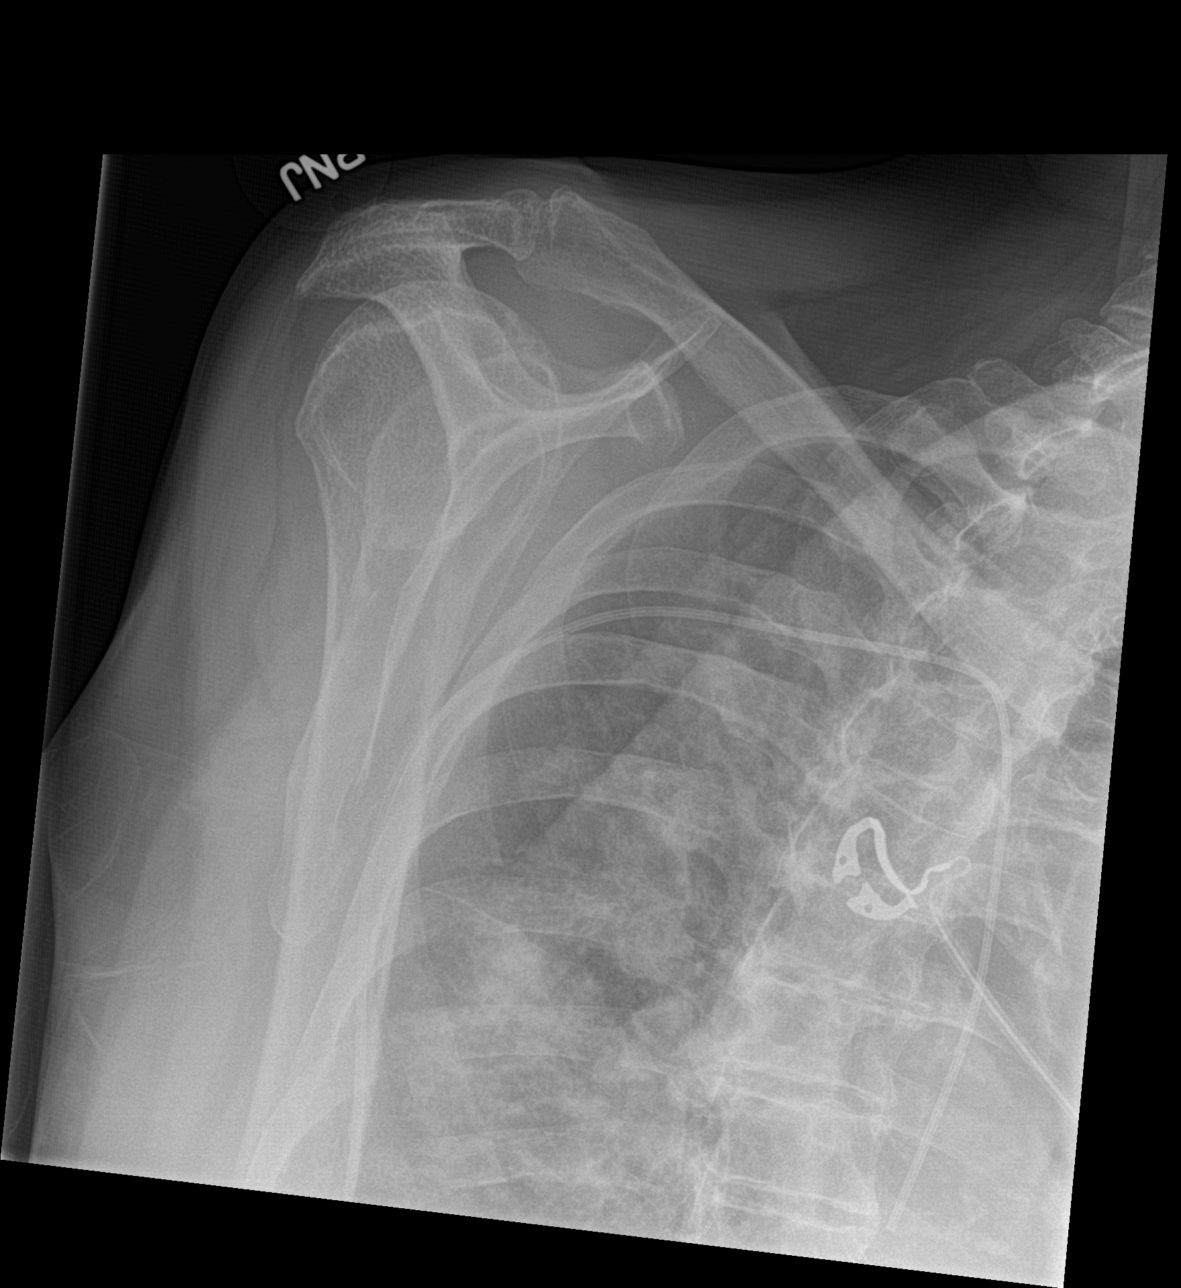

[3 of 3 positions shown; findings below may reference images not displayed]

FINDINGS: The bones are subjectively adequately mineralized. The glenohumeral
joint space is grossly normal. There is moderate narrowing of the AC
joint. The subacromial subdeltoid space is normal. There is a small
amount of calcification in the region of the insertion of the
rotator cuff on the greater tuberosity of the humerus. The observed
portions of the right lung exhibit increased interstitial and patchy
airspace opacities.
IMPRESSION: Degenerative change of the right shoulder centered on the AC joint.
No acute bony abnormality.
